# Patient Record
Sex: Female | Born: 1940
Health system: Southern US, Community
[De-identification: ages and names within clinical notes are randomized; demographics above are authoritative.]

## PROBLEM LIST (undated history)

## (undated) DIAGNOSIS — I10 Essential (primary) hypertension: Secondary | ICD-10-CM

## (undated) DIAGNOSIS — M1990 Unspecified osteoarthritis, unspecified site: Secondary | ICD-10-CM

## (undated) DIAGNOSIS — H3530 Unspecified macular degeneration: Secondary | ICD-10-CM

## (undated) DIAGNOSIS — E785 Hyperlipidemia, unspecified: Secondary | ICD-10-CM

## (undated) DIAGNOSIS — E119 Type 2 diabetes mellitus without complications: Secondary | ICD-10-CM

## (undated) DIAGNOSIS — C50919 Malignant neoplasm of unspecified site of unspecified female breast: Secondary | ICD-10-CM

## (undated) DIAGNOSIS — N189 Chronic kidney disease, unspecified: Secondary | ICD-10-CM

## (undated) HISTORY — DX: Unspecified macular degeneration: H35.30

## (undated) HISTORY — DX: Unspecified osteoarthritis, unspecified site: M19.90

## (undated) HISTORY — DX: Essential (primary) hypertension: I10

## (undated) HISTORY — DX: Chronic kidney disease, unspecified: N18.9

## (undated) HISTORY — PX: COLONOSCOPY: SHX174

## (undated) HISTORY — DX: Malignant neoplasm of unspecified site of unspecified female breast: C50.919

## (undated) HISTORY — DX: Hyperlipidemia, unspecified: E78.5

---

## 2000-01-24 ENCOUNTER — Other Ambulatory Visit: Admission: RE | Admit: 2000-01-24 | Discharge: 2000-01-24 | Payer: Self-pay | Admitting: Family Medicine

## 2000-02-01 ENCOUNTER — Encounter: Payer: Self-pay | Admitting: Family Medicine

## 2000-02-01 ENCOUNTER — Encounter: Admission: RE | Admit: 2000-02-01 | Discharge: 2000-02-01 | Payer: Self-pay | Admitting: Family Medicine

## 2001-11-18 ENCOUNTER — Encounter: Admission: RE | Admit: 2001-11-18 | Discharge: 2001-11-18 | Payer: Self-pay | Admitting: Family Medicine

## 2001-11-18 ENCOUNTER — Encounter: Payer: Self-pay | Admitting: Family Medicine

## 2003-04-16 ENCOUNTER — Ambulatory Visit (HOSPITAL_COMMUNITY): Admission: RE | Admit: 2003-04-16 | Discharge: 2003-04-16 | Payer: Self-pay | Admitting: Family Medicine

## 2003-04-16 ENCOUNTER — Encounter: Payer: Self-pay | Admitting: Family Medicine

## 2003-07-16 ENCOUNTER — Encounter: Admission: RE | Admit: 2003-07-16 | Discharge: 2003-07-16 | Payer: Self-pay | Admitting: Family Medicine

## 2003-07-16 ENCOUNTER — Encounter: Payer: Self-pay | Admitting: Family Medicine

## 2004-03-07 ENCOUNTER — Other Ambulatory Visit: Admission: RE | Admit: 2004-03-07 | Discharge: 2004-03-07 | Payer: Self-pay | Admitting: Family Medicine

## 2004-09-27 ENCOUNTER — Ambulatory Visit: Payer: Self-pay | Admitting: Family Medicine

## 2004-12-08 ENCOUNTER — Encounter (HOSPITAL_COMMUNITY): Admission: RE | Admit: 2004-12-08 | Discharge: 2005-01-07 | Payer: Self-pay | Admitting: Internal Medicine

## 2005-01-02 ENCOUNTER — Ambulatory Visit: Payer: Self-pay | Admitting: Family Medicine

## 2005-02-15 ENCOUNTER — Ambulatory Visit: Payer: Self-pay | Admitting: Family Medicine

## 2005-03-22 ENCOUNTER — Ambulatory Visit: Payer: Self-pay | Admitting: Family Medicine

## 2005-03-29 ENCOUNTER — Other Ambulatory Visit: Admission: RE | Admit: 2005-03-29 | Discharge: 2005-03-29 | Payer: Self-pay | Admitting: Family Medicine

## 2005-03-29 ENCOUNTER — Ambulatory Visit: Payer: Self-pay | Admitting: Family Medicine

## 2005-03-29 LAB — CONVERTED CEMR LAB

## 2005-05-04 ENCOUNTER — Encounter: Admission: RE | Admit: 2005-05-04 | Discharge: 2005-05-04 | Payer: Self-pay | Admitting: Family Medicine

## 2005-07-10 ENCOUNTER — Ambulatory Visit: Payer: Self-pay | Admitting: Family Medicine

## 2005-10-11 ENCOUNTER — Ambulatory Visit: Payer: Self-pay | Admitting: Family Medicine

## 2006-02-19 ENCOUNTER — Ambulatory Visit: Payer: Self-pay | Admitting: Family Medicine

## 2006-04-02 ENCOUNTER — Encounter: Payer: Self-pay | Admitting: Family Medicine

## 2006-04-02 ENCOUNTER — Other Ambulatory Visit: Admission: RE | Admit: 2006-04-02 | Discharge: 2006-04-02 | Payer: Self-pay | Admitting: Family Medicine

## 2006-04-02 ENCOUNTER — Ambulatory Visit: Payer: Self-pay | Admitting: Family Medicine

## 2006-04-04 LAB — CONVERTED CEMR LAB: Pap Smear: NORMAL

## 2006-05-08 ENCOUNTER — Ambulatory Visit: Payer: Self-pay | Admitting: Family Medicine

## 2006-06-05 ENCOUNTER — Ambulatory Visit: Payer: Self-pay | Admitting: Family Medicine

## 2006-07-18 ENCOUNTER — Encounter: Admission: RE | Admit: 2006-07-18 | Discharge: 2006-07-18 | Payer: Self-pay | Admitting: Family Medicine

## 2006-07-18 IMAGING — MG MM SCREEN MAMMOGRAM BILATERAL
4 series · 4 of 4 positions shown · non-contrast
Comparison: none

DG SCREEN MAMMOGRAM BILATERAL
Bilateral CC and MLO view(s) were taken.

DIGITAL SCREENING MAMMOGRAM WITH CAD:
There is a fibrofatty pattern.  No masses or malignant type calcifications are identified.  
Compared with prior studies.

[R CC]
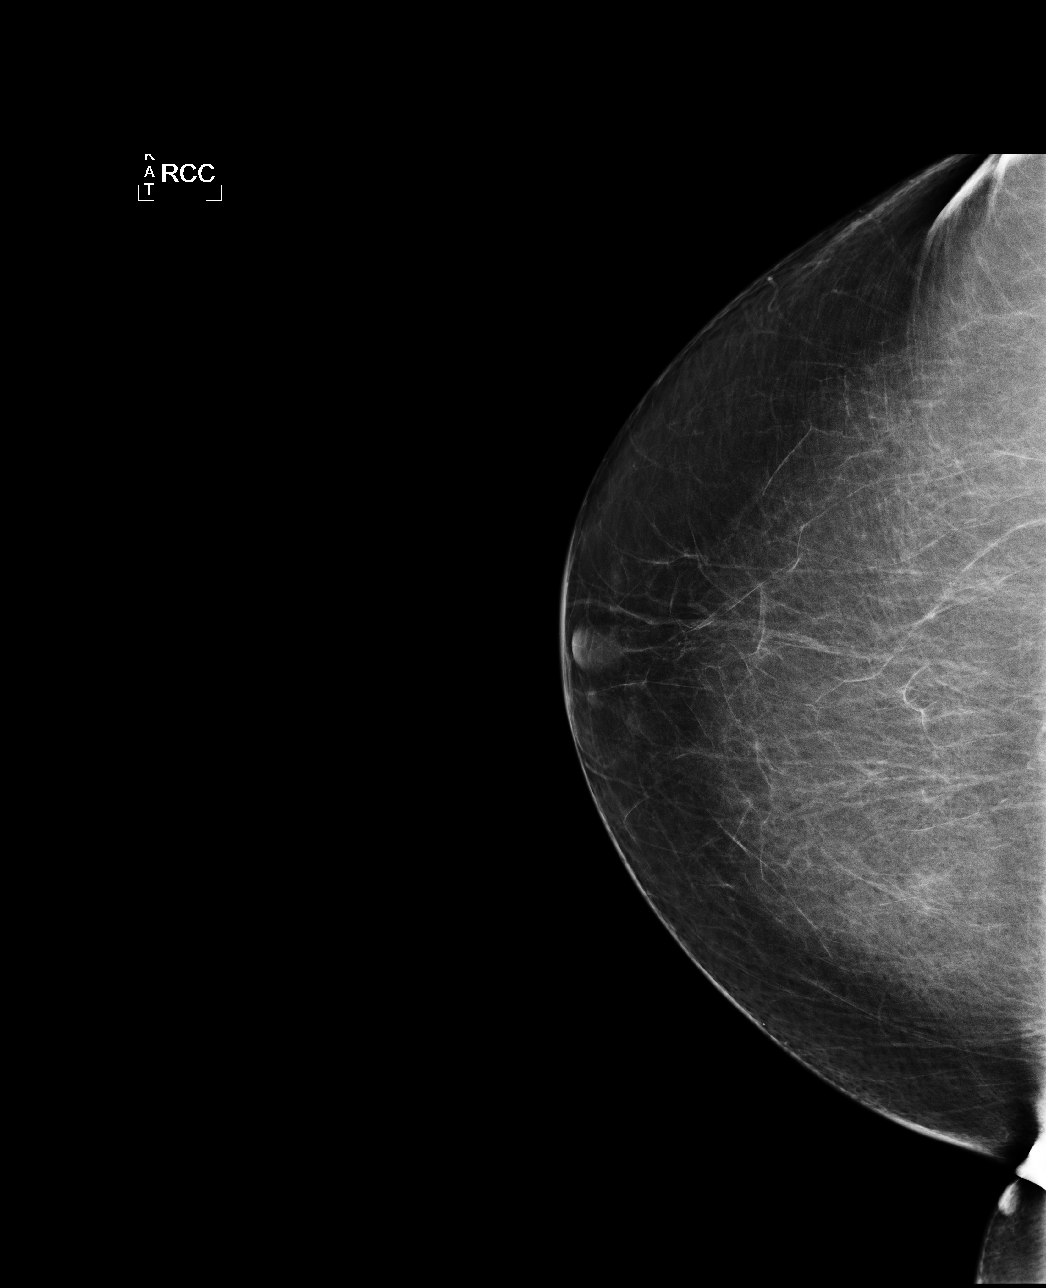

[L CC]
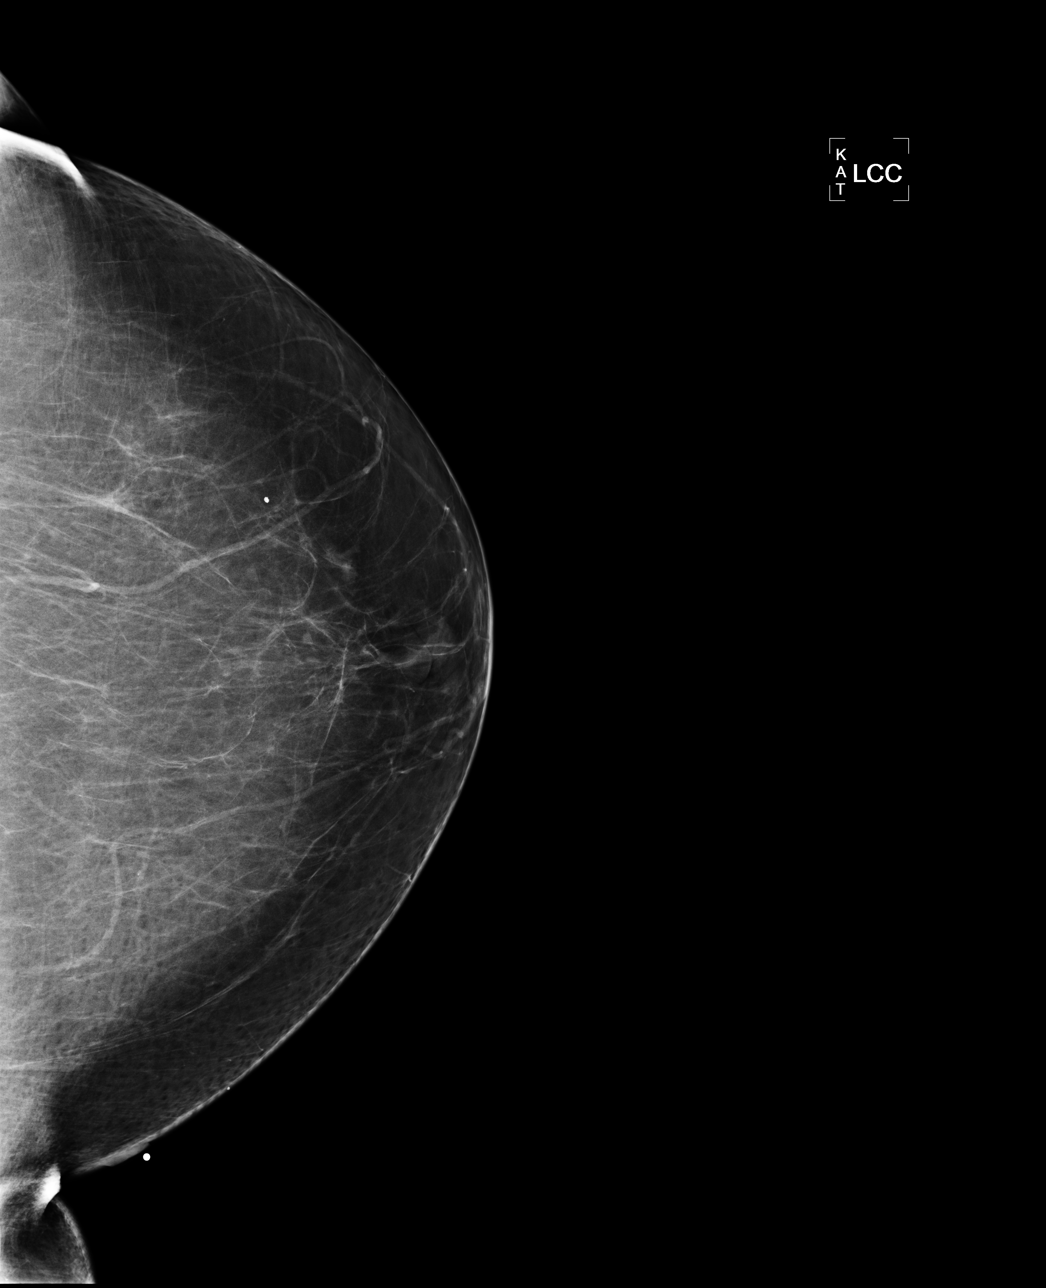

[L MLO]
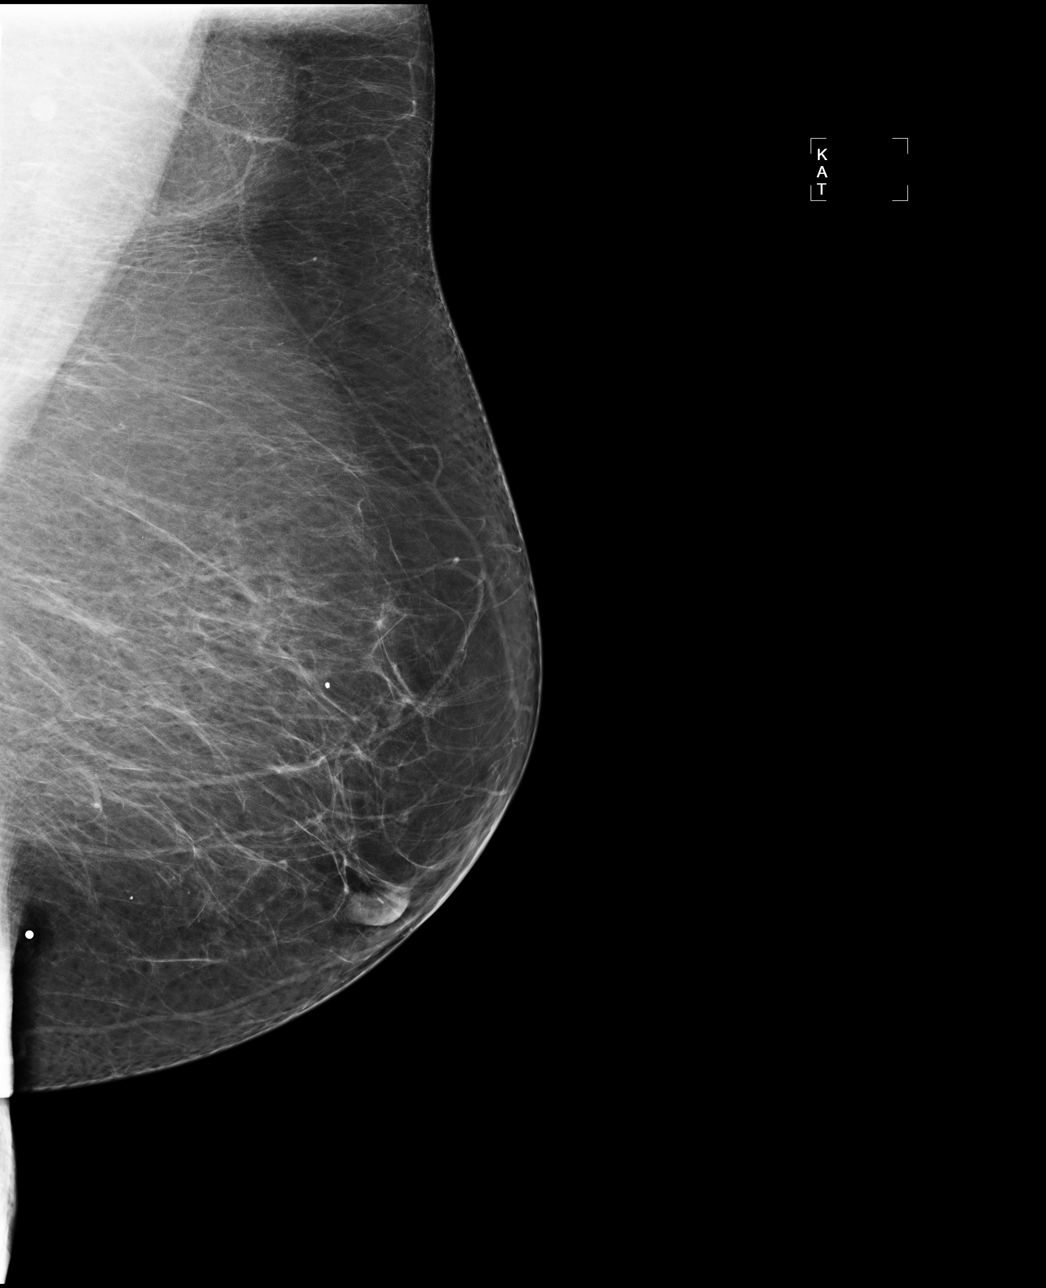

[R MLO]
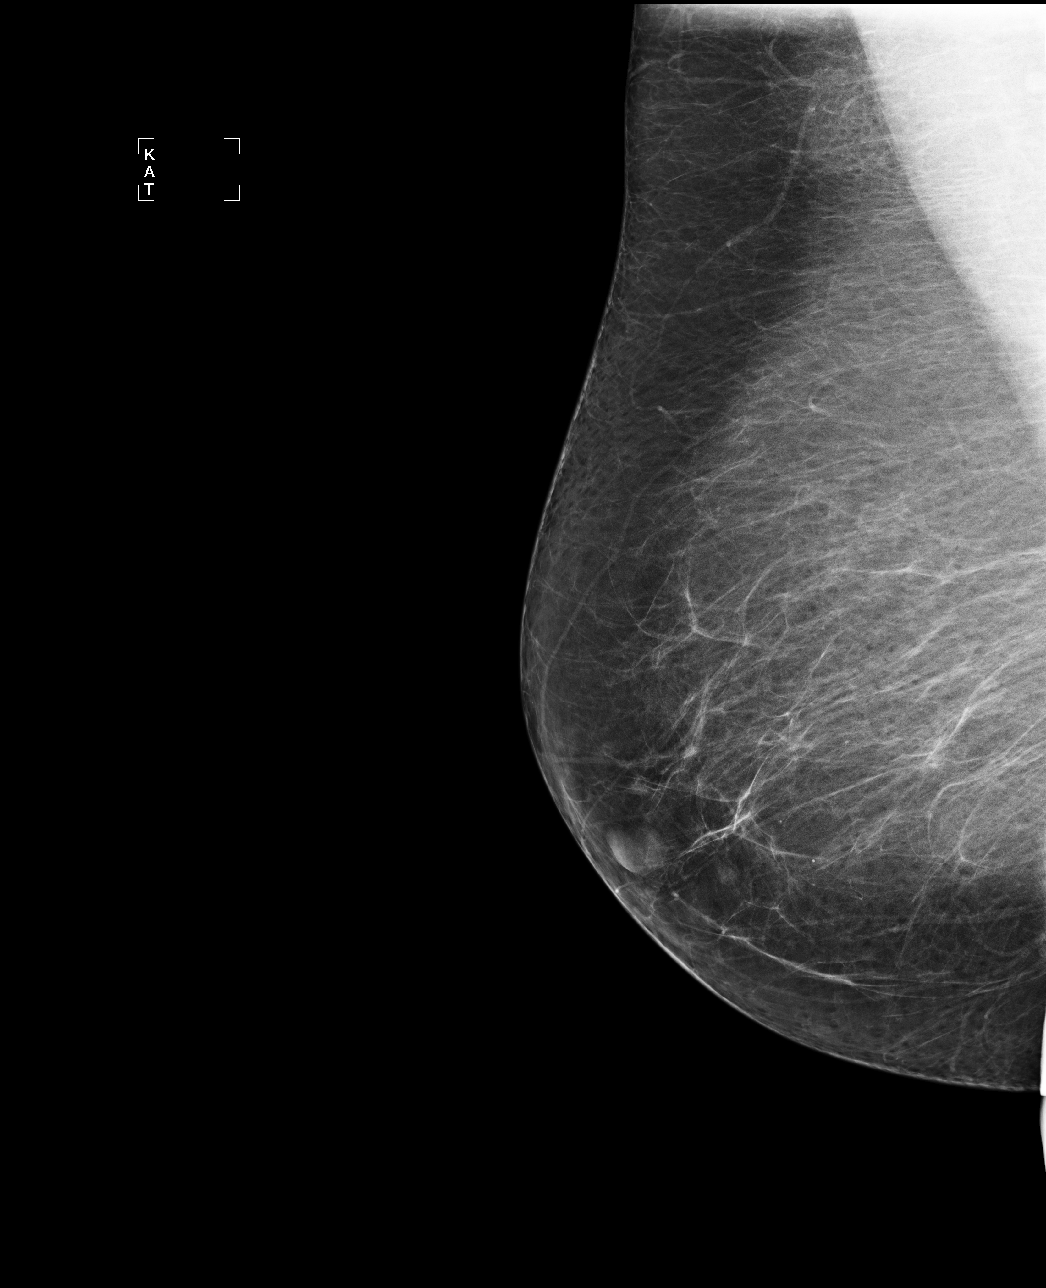

[4 of 4 positions shown; findings below may reference images not displayed]

IMPRESSION: No specific mammographic evidence of malignancy.  Next screening mammogram is recommended in one 
year.

ASSESSMENT: Negative - BI-RADS 1

Screening mammogram in 1 year.
ANALYZED BY COMPUTER AIDED DETECTION. , THIS PROCEDURE WAS A DIGITAL MAMMOGRAM.

## 2006-12-26 ENCOUNTER — Ambulatory Visit: Payer: Self-pay | Admitting: Family Medicine

## 2007-06-02 ENCOUNTER — Encounter: Payer: Self-pay | Admitting: Family Medicine

## 2007-06-02 DIAGNOSIS — I10 Essential (primary) hypertension: Secondary | ICD-10-CM | POA: Insufficient documentation

## 2007-06-19 ENCOUNTER — Ambulatory Visit: Payer: Self-pay | Admitting: Family Medicine

## 2007-06-19 DIAGNOSIS — Z8739 Personal history of other diseases of the musculoskeletal system and connective tissue: Secondary | ICD-10-CM | POA: Insufficient documentation

## 2007-06-19 DIAGNOSIS — M810 Age-related osteoporosis without current pathological fracture: Secondary | ICD-10-CM | POA: Insufficient documentation

## 2007-06-19 DIAGNOSIS — D649 Anemia, unspecified: Secondary | ICD-10-CM | POA: Insufficient documentation

## 2007-06-19 DIAGNOSIS — E7800 Pure hypercholesterolemia, unspecified: Secondary | ICD-10-CM | POA: Insufficient documentation

## 2007-06-19 DIAGNOSIS — B369 Superficial mycosis, unspecified: Secondary | ICD-10-CM | POA: Insufficient documentation

## 2007-06-19 DIAGNOSIS — E039 Hypothyroidism, unspecified: Secondary | ICD-10-CM | POA: Insufficient documentation

## 2007-06-19 DIAGNOSIS — N189 Chronic kidney disease, unspecified: Secondary | ICD-10-CM | POA: Insufficient documentation

## 2007-06-20 ENCOUNTER — Encounter: Payer: Self-pay | Admitting: Family Medicine

## 2007-07-02 LAB — CONVERTED CEMR LAB: Vit D, 1,25-Dihydroxy: 22 (ref 20–57)

## 2007-08-27 ENCOUNTER — Encounter: Admission: RE | Admit: 2007-08-27 | Discharge: 2007-08-27 | Payer: Self-pay | Admitting: Family Medicine

## 2007-10-01 ENCOUNTER — Telehealth: Payer: Self-pay | Admitting: Family Medicine

## 2007-10-02 ENCOUNTER — Telehealth: Payer: Self-pay | Admitting: Family Medicine

## 2007-10-10 ENCOUNTER — Encounter: Payer: Self-pay | Admitting: Family Medicine

## 2007-10-10 ENCOUNTER — Ambulatory Visit (HOSPITAL_COMMUNITY): Admission: RE | Admit: 2007-10-10 | Discharge: 2007-10-10 | Payer: Self-pay | Admitting: Family Medicine

## 2007-10-14 ENCOUNTER — Ambulatory Visit: Payer: Self-pay | Admitting: Family Medicine

## 2007-10-14 LAB — CONVERTED CEMR LAB: Hemoglobin: 12.9 g/dL

## 2007-10-15 ENCOUNTER — Encounter: Payer: Self-pay | Admitting: Family Medicine

## 2007-10-15 LAB — CONVERTED CEMR LAB: Vit D, 1,25-Dihydroxy: 32 (ref 30–89)

## 2007-11-10 ENCOUNTER — Encounter: Payer: Self-pay | Admitting: Family Medicine

## 2007-11-13 ENCOUNTER — Telehealth: Payer: Self-pay | Admitting: Family Medicine

## 2007-11-17 LAB — CONVERTED CEMR LAB
Cholesterol: 232 mg/dL (ref 0–200)
Direct LDL: 149.5 mg/dL
HDL: 39.7 mg/dL (ref >39.0)
Total CHOL/HDL Ratio: 5.8
Triglycerides: 213 mg/dL (ref 0–149)
VLDL: 43 mg/dL — ABNORMAL HIGH (ref 0–40)

## 2007-12-03 ENCOUNTER — Telehealth: Payer: Self-pay | Admitting: Family Medicine

## 2008-01-20 ENCOUNTER — Ambulatory Visit: Payer: Self-pay | Admitting: Family Medicine

## 2008-01-20 DIAGNOSIS — R609 Edema, unspecified: Secondary | ICD-10-CM | POA: Insufficient documentation

## 2008-01-20 DIAGNOSIS — G458 Other transient cerebral ischemic attacks and related syndromes: Secondary | ICD-10-CM | POA: Insufficient documentation

## 2008-01-23 ENCOUNTER — Ambulatory Visit (HOSPITAL_COMMUNITY): Admission: RE | Admit: 2008-01-23 | Discharge: 2008-01-23 | Payer: Self-pay | Admitting: Family Medicine

## 2008-01-23 IMAGING — US US CAROTID DUPLEX BILAT
1 series · 13 of 24 positions shown · non-contrast
Comparison: None

CLINICAL DATA: T I A AND HYPERTENSION

BILATERAL CAROTID DUPLEX ULTRASOUND
TECHNIQUE: Gray scale imaging, color Doppler and duplex ultrasound
was performed of bilateral carotid and vertebral arteries in the
neck.

[Series 1: unknown · 0.09mm/px · 13 of 52 slices shown]
[im 1/52]
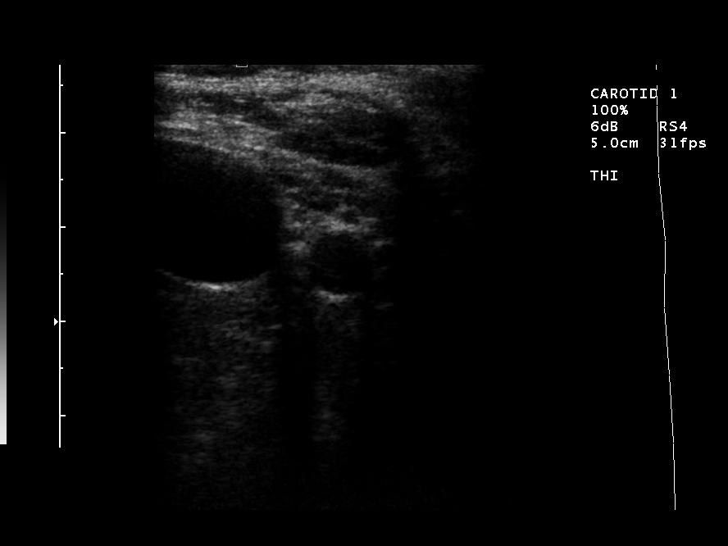
[im 5/52]
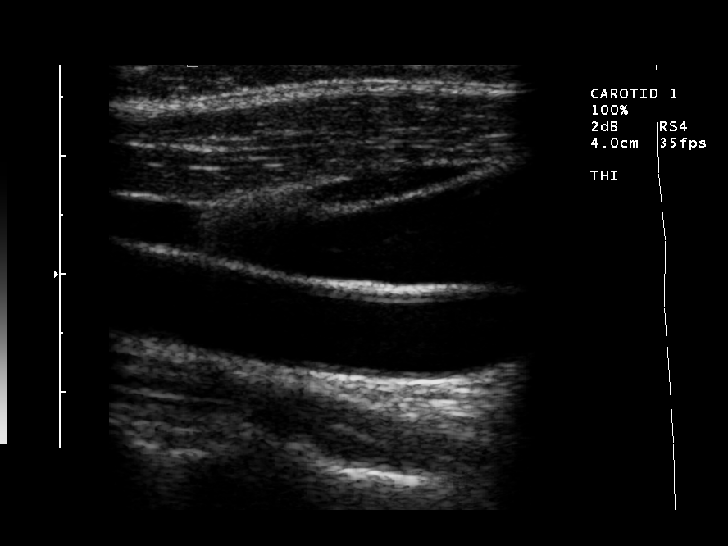
[im 9/52]
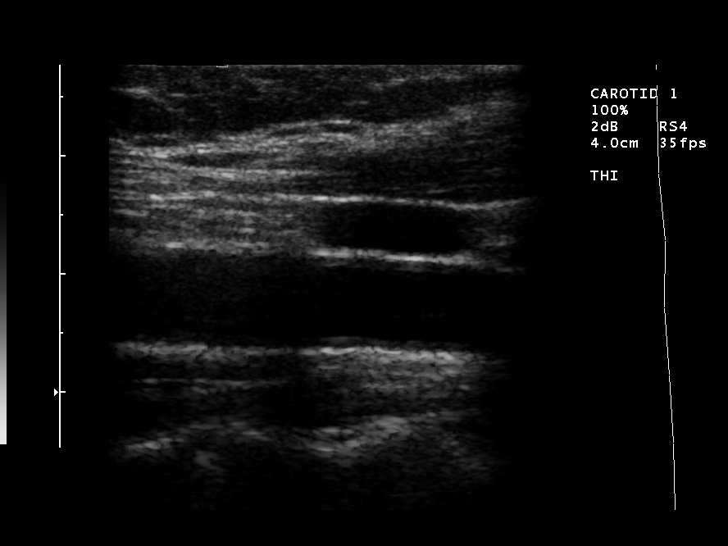
[im 14/52]
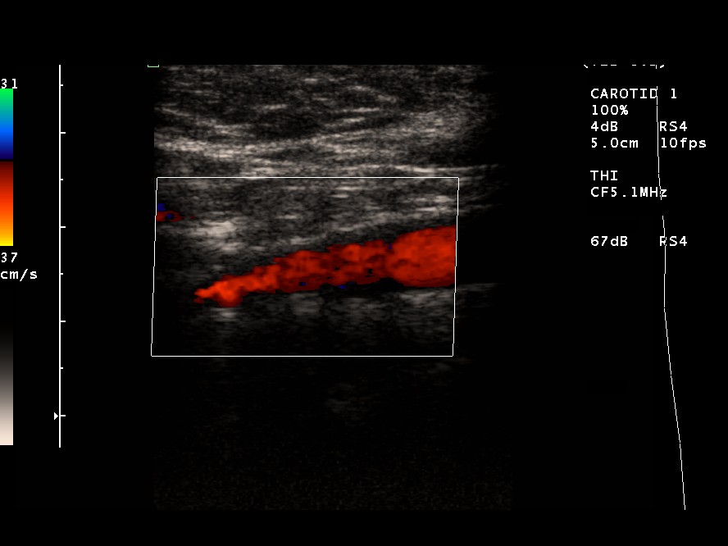
[im 18/52]
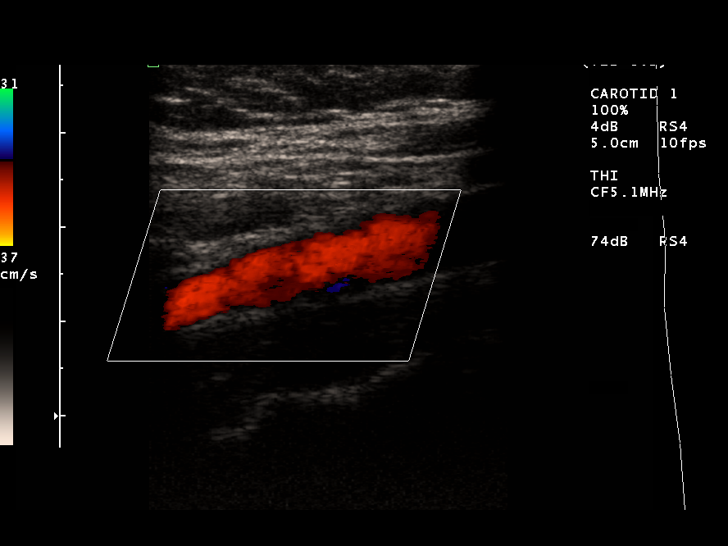
[im 23/52]
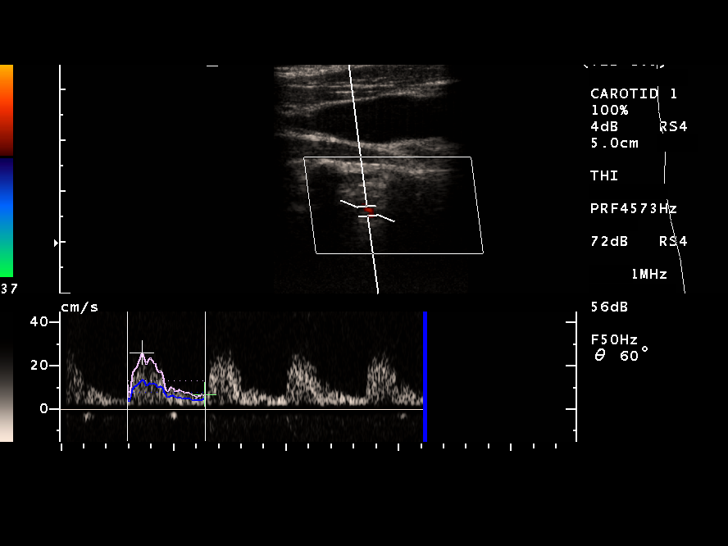
[im 27/52]
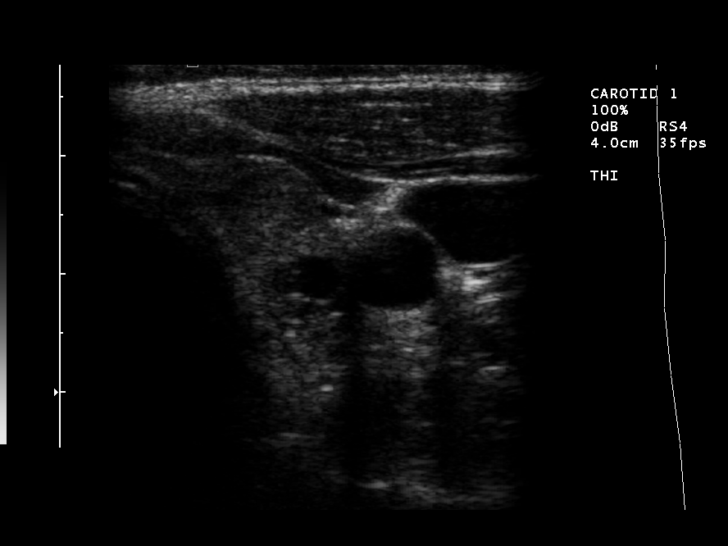
[im 29/52]
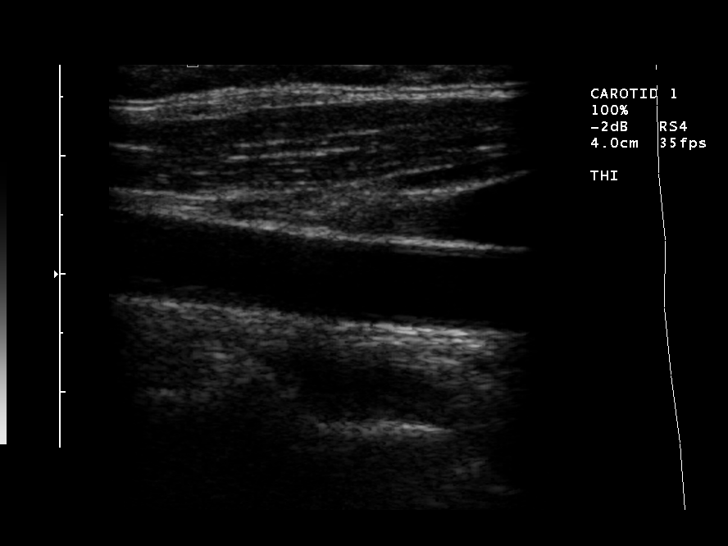
[im 34/52]
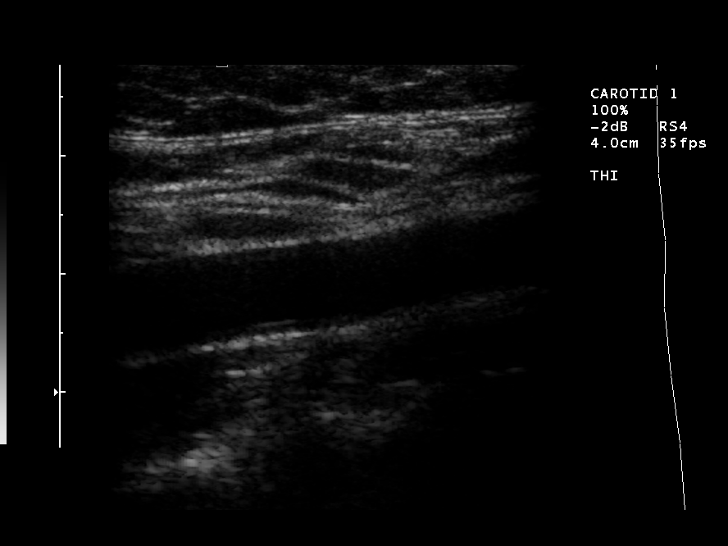
[im 38/52]
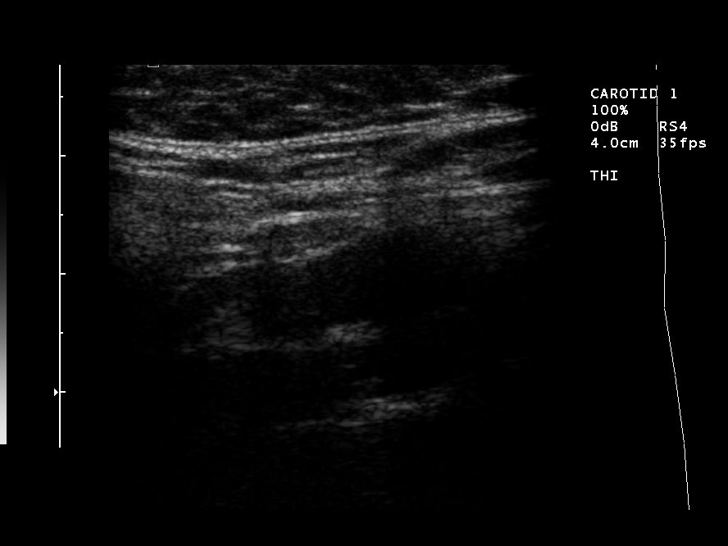
[im 43/52]
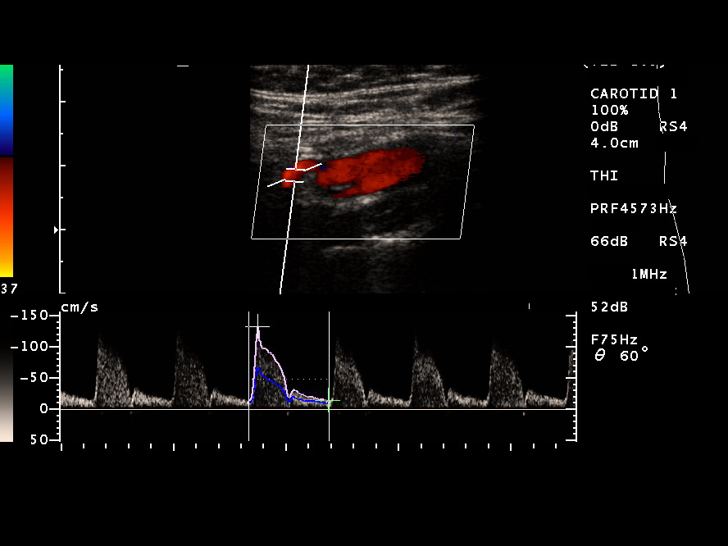
[im 47/52]
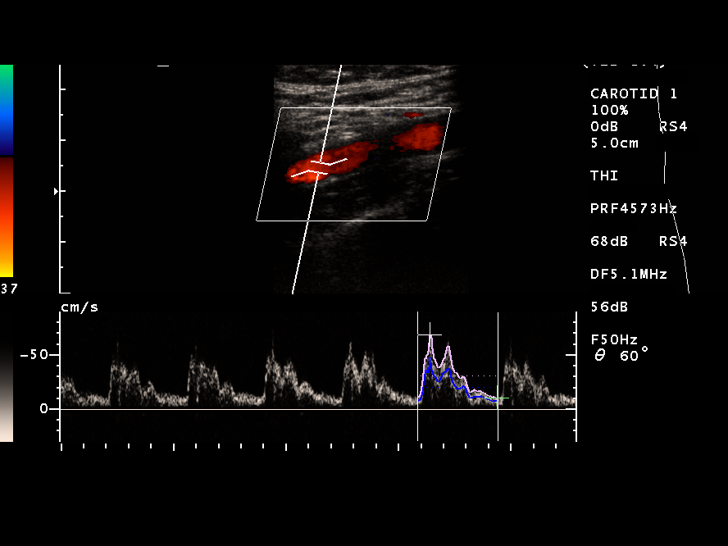
[im 52/52]
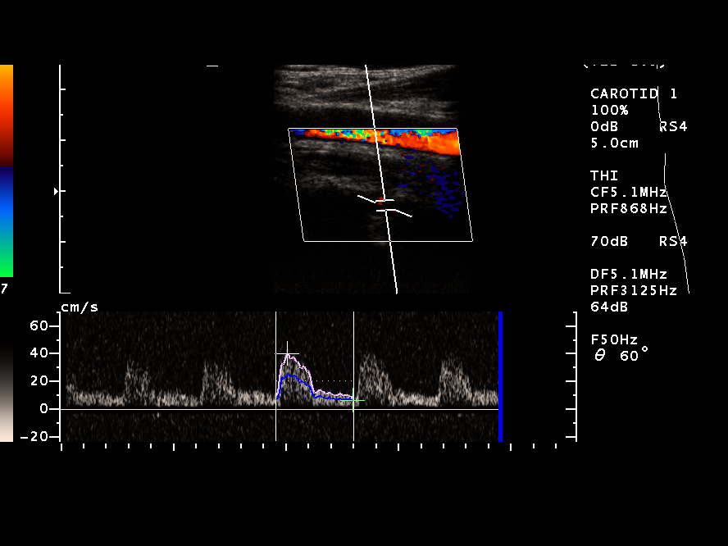

[13 of 24 positions shown; findings below may reference images not displayed]

Criteria:  Quantification of carotid stenosis is based on velocity
parameters that correlate the residual internal carotid diameter
with NASCET-based stenosis levels.

The following velocity measurements were obtained:

                 PEAK SYSTOLIC/END DIASTOLIC
RIGHT
ICA:                        96/16cm/sec
CCA:                        96/20cm/sec
SYSTOLIC ICA/CCA RATIO:
DIASTOLIC ICA/CCA RATIO:
ECA:                        114/20cm/sec

LEFT
ICA:                        76/20cm/sec
CCA:                        92/17cm/sec
SYSTOLIC ICA/CCA RATIO:
DIASTOLIC ICA/CCA RATIO:
ECA:                        131/13cm/sec
FINDINGS: RIGHT CAROTID ARTERY: No significant plaque.  Normal velocities and
spectral wave forms present.  Estimated right ICA stenosis in its
less than 50%.

RIGHT VERTEBRAL ARTERY:  Antegrade flow with normal wave form.

LEFT CAROTID ARTERY: Small focus of plaque in the distal bulb and
the proximal external carotid artery.  No significant plaque is
identified in the left ICA.  Normal velocities and spectral wave
forms correspond to an estimated left ICA stenosis of less than
50%.

LEFT VERTEBRAL ARTERY:  Antegrade flow with normal wave form.
IMPRESSION: No significant carotid disease identified in the neck.  Estimated
bilateral less than 50% ICA stenoses based on velocity criteria.

## 2008-02-19 ENCOUNTER — Ambulatory Visit: Payer: Self-pay | Admitting: Family Medicine

## 2008-03-17 ENCOUNTER — Telehealth: Payer: Self-pay | Admitting: Family Medicine

## 2008-05-20 ENCOUNTER — Ambulatory Visit: Payer: Self-pay | Admitting: Family Medicine

## 2008-05-20 DIAGNOSIS — L259 Unspecified contact dermatitis, unspecified cause: Secondary | ICD-10-CM | POA: Insufficient documentation

## 2008-07-20 ENCOUNTER — Other Ambulatory Visit: Admission: RE | Admit: 2008-07-20 | Discharge: 2008-07-20 | Payer: Self-pay | Admitting: Family Medicine

## 2008-07-20 ENCOUNTER — Ambulatory Visit: Payer: Self-pay | Admitting: Family Medicine

## 2008-07-20 ENCOUNTER — Encounter: Payer: Self-pay | Admitting: Family Medicine

## 2008-07-20 DIAGNOSIS — F411 Generalized anxiety disorder: Secondary | ICD-10-CM | POA: Insufficient documentation

## 2008-07-20 DIAGNOSIS — T50995A Adverse effect of other drugs, medicaments and biological substances, initial encounter: Secondary | ICD-10-CM | POA: Insufficient documentation

## 2008-07-20 LAB — CONVERTED CEMR LAB
Bilirubin Urine: NEGATIVE
Glucose, Urine, Semiquant: NEGATIVE
Ketones, urine, test strip: NEGATIVE
Nitrite: NEGATIVE
Protein, U semiquant: NEGATIVE
Specific Gravity, Urine: 1.015
Urobilinogen, UA: 0.2
pH: 5.5

## 2008-07-20 LAB — HM PAP SMEAR

## 2008-07-25 LAB — CONVERTED CEMR LAB
ALT: 20 units/L (ref 0–35)
AST: 21 units/L (ref 0–37)
Albumin: 4 g/dL (ref 3.5–5.2)
Alkaline Phosphatase: 52 units/L (ref 39–117)
BUN: 28 mg/dL — ABNORMAL HIGH (ref 6–23)
Basophils Absolute: 0.1 10*3/uL (ref 0.0–0.1)
Basophils Relative: 2 % (ref 0.0–3.0)
Bilirubin, Direct: 0.1 mg/dL (ref 0.0–0.3)
CO2: 26 meq/L (ref 19–32)
Calcium: 10 mg/dL (ref 8.4–10.5)
Chloride: 107 meq/L (ref 96–112)
Cholesterol: 256 mg/dL (ref 0–200)
Creatinine, Ser: 1.2 mg/dL (ref 0.4–1.2)
Direct LDL: 188.5 mg/dL
Eosinophils Absolute: 0.2 10*3/uL (ref 0.0–0.7)
Eosinophils Relative: 3 % (ref 0.0–5.0)
GFR calc Af Amer: 58 mL/min
GFR calc non Af Amer: 48 mL/min
Glucose, Bld: 115 mg/dL — ABNORMAL HIGH (ref 70–99)
HCT: 34.8 % — ABNORMAL LOW (ref 36.0–46.0)
HDL: 41.7 mg/dL (ref >39.0)
Hemoglobin: 12.5 g/dL (ref 12.0–15.0)
Lymphocytes Relative: 17.1 % (ref 12.0–46.0)
MCHC: 35.8 g/dL (ref 30.0–36.0)
MCV: 90.3 fL (ref 78.0–100.0)
Monocytes Absolute: 0.5 10*3/uL (ref 0.1–1.0)
Monocytes Relative: 8 % (ref 3.0–12.0)
Neutro Abs: 4.2 10*3/uL (ref 1.4–7.7)
Neutrophils Relative %: 69.9 % (ref 43.0–77.0)
Platelets: 353 10*3/uL (ref 150–400)
Potassium: 4.1 meq/L (ref 3.5–5.1)
RBC: 3.86 M/uL — ABNORMAL LOW (ref 3.87–5.11)
RDW: 12.3 % (ref 11.5–14.6)
Sodium: 140 meq/L (ref 135–145)
TSH: 1.23 microintl units/mL (ref 0.35–5.50)
Total Bilirubin: 1 mg/dL (ref 0.3–1.2)
Total CHOL/HDL Ratio: 6.1
Total Protein: 7.7 g/dL (ref 6.0–8.3)
Triglycerides: 182 mg/dL — ABNORMAL HIGH (ref 0–149)
VLDL: 36 mg/dL (ref 0–40)
Vit D, 1,25-Dihydroxy: 53 (ref 30–89)
WBC: 6 10*3/uL (ref 4.5–10.5)

## 2008-10-19 ENCOUNTER — Ambulatory Visit: Payer: Self-pay | Admitting: Family Medicine

## 2008-11-11 ENCOUNTER — Encounter: Admission: RE | Admit: 2008-11-11 | Discharge: 2008-11-11 | Payer: Self-pay | Admitting: Family Medicine

## 2008-11-11 ENCOUNTER — Telehealth: Payer: Self-pay | Admitting: Family Medicine

## 2008-11-11 IMAGING — MG MM SCREEN MAMMOGRAM BILATERAL
5 series · 5 of 5 positions shown · non-contrast
Comparison: none

DG SCREEN MAMMOGRAM BILATERAL
Bilateral CC and MLO view(s) were taken.

DIGITAL SCREENING MAMMOGRAM WITH CAD:
There are scattered fibroglandular densities.  No masses or malignant type calcifications are 
identified.  Compared with prior studies.

[R CC]
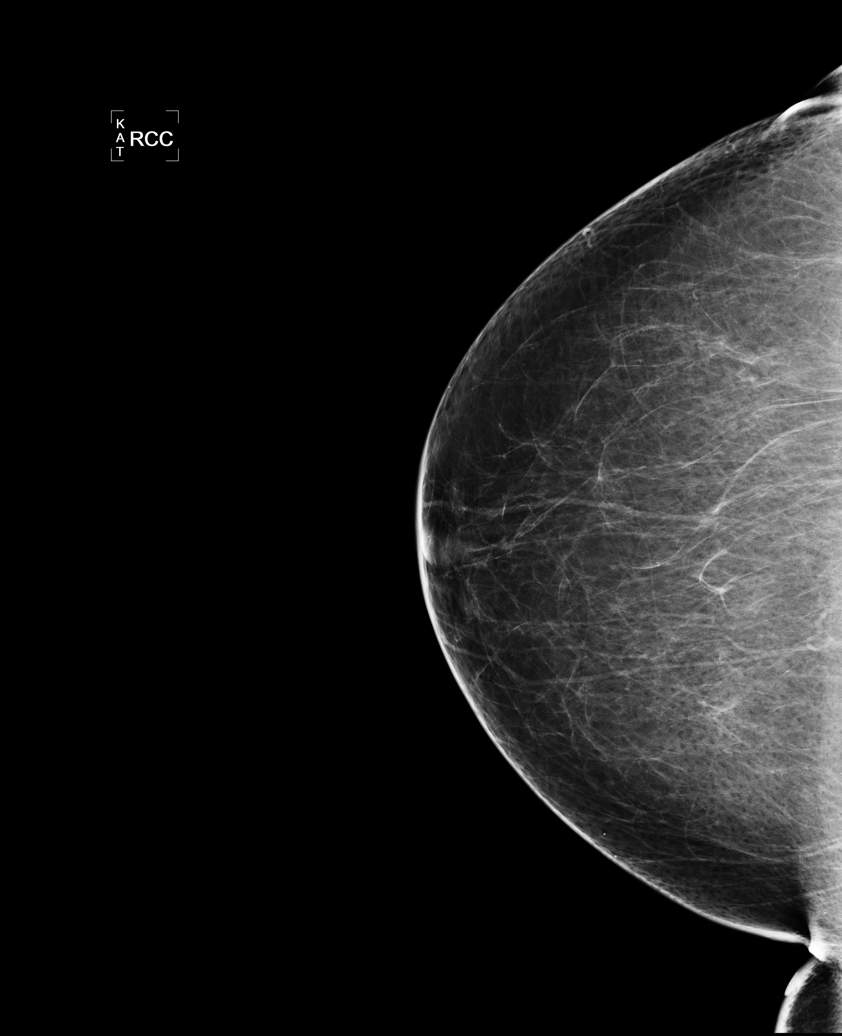

[L CC]
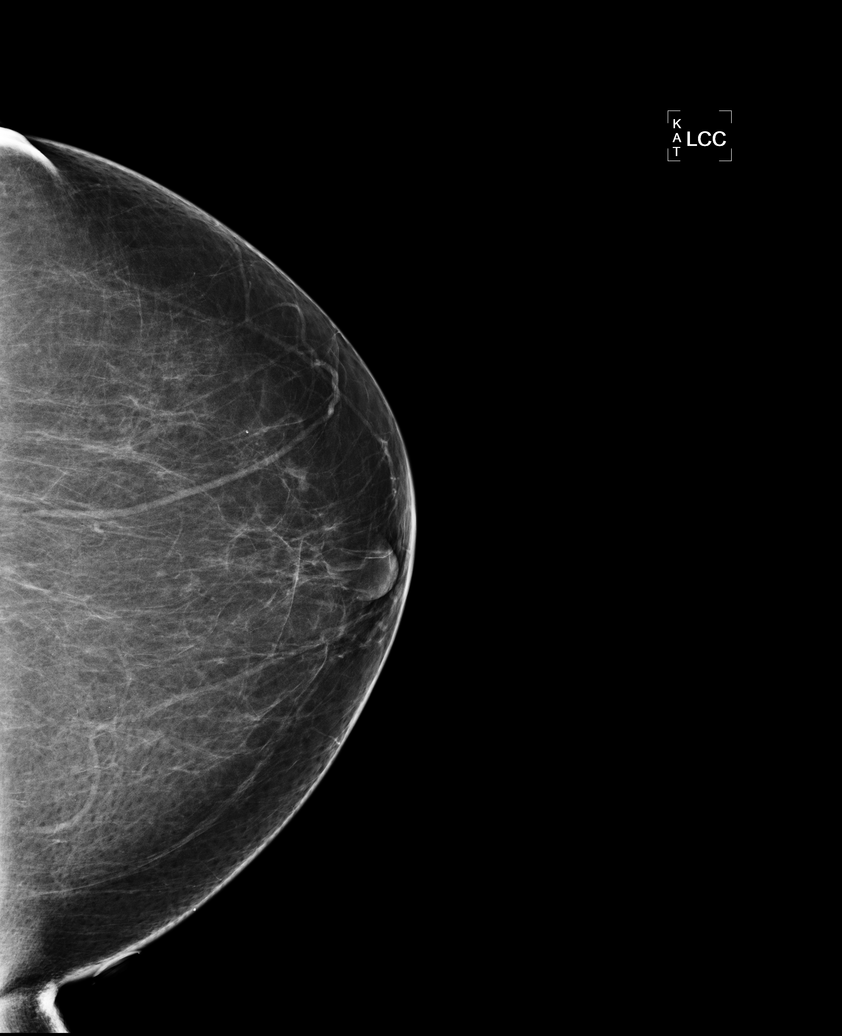

[L MLO (1 of 2)]
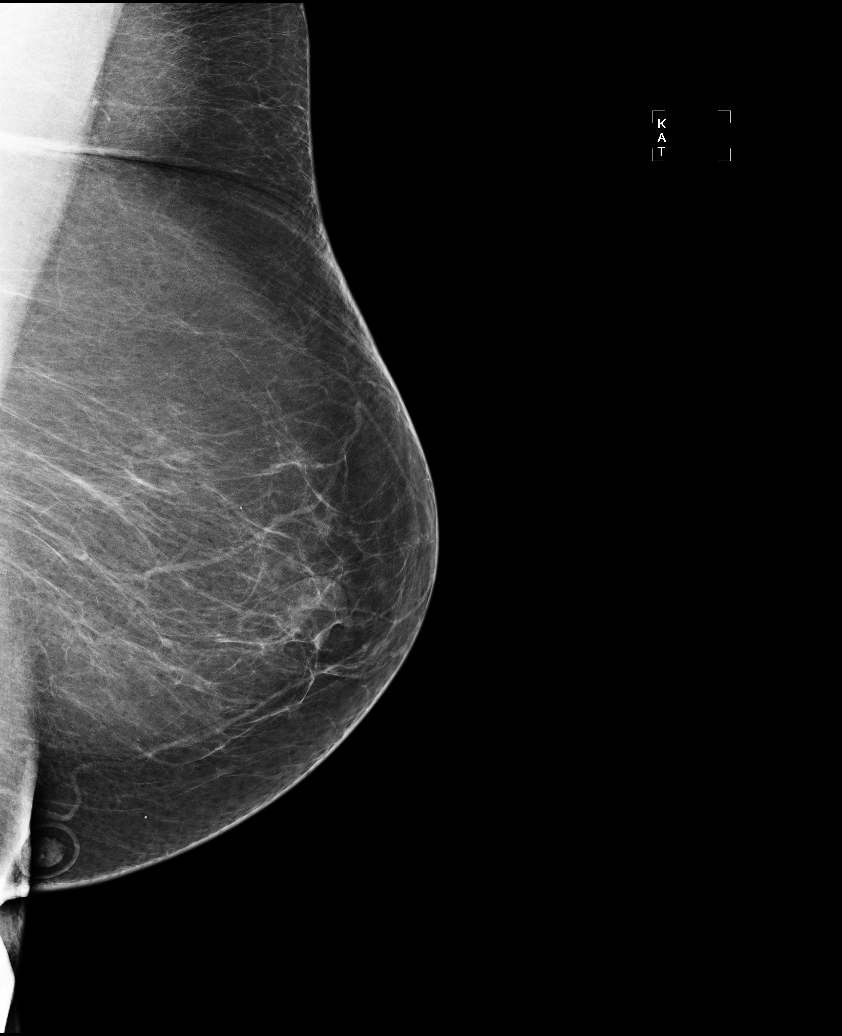

[R MLO]
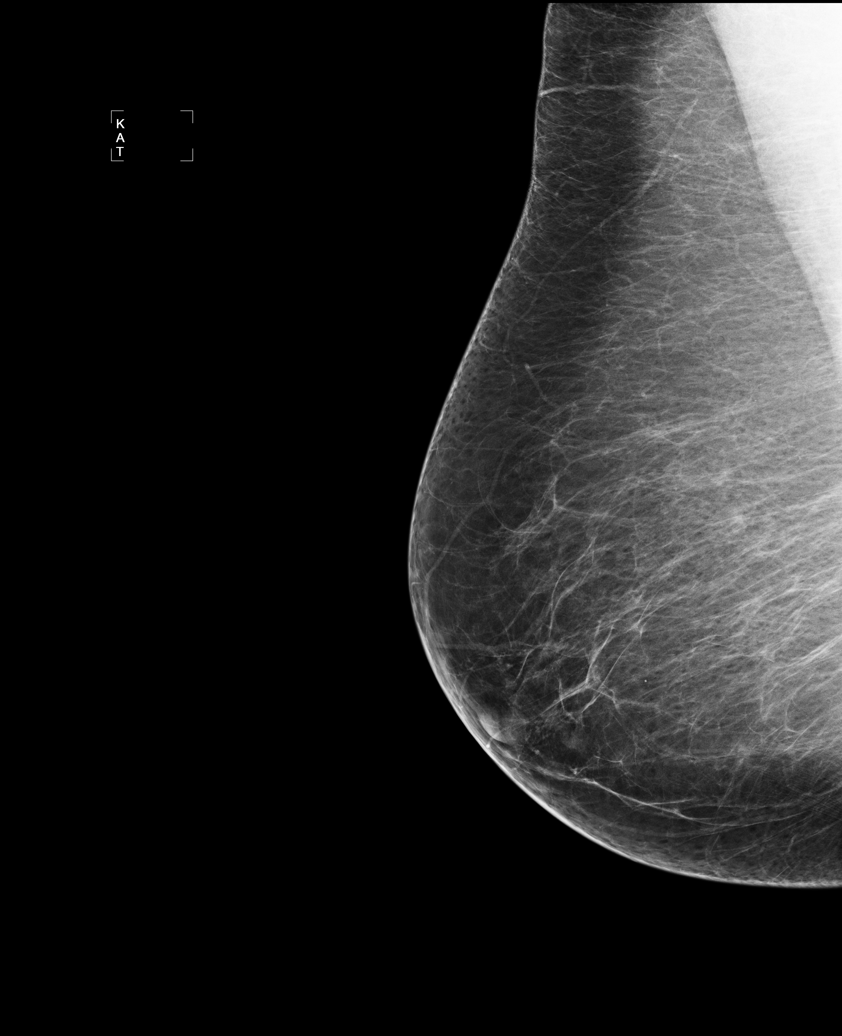

[L MLO (2 of 2)]
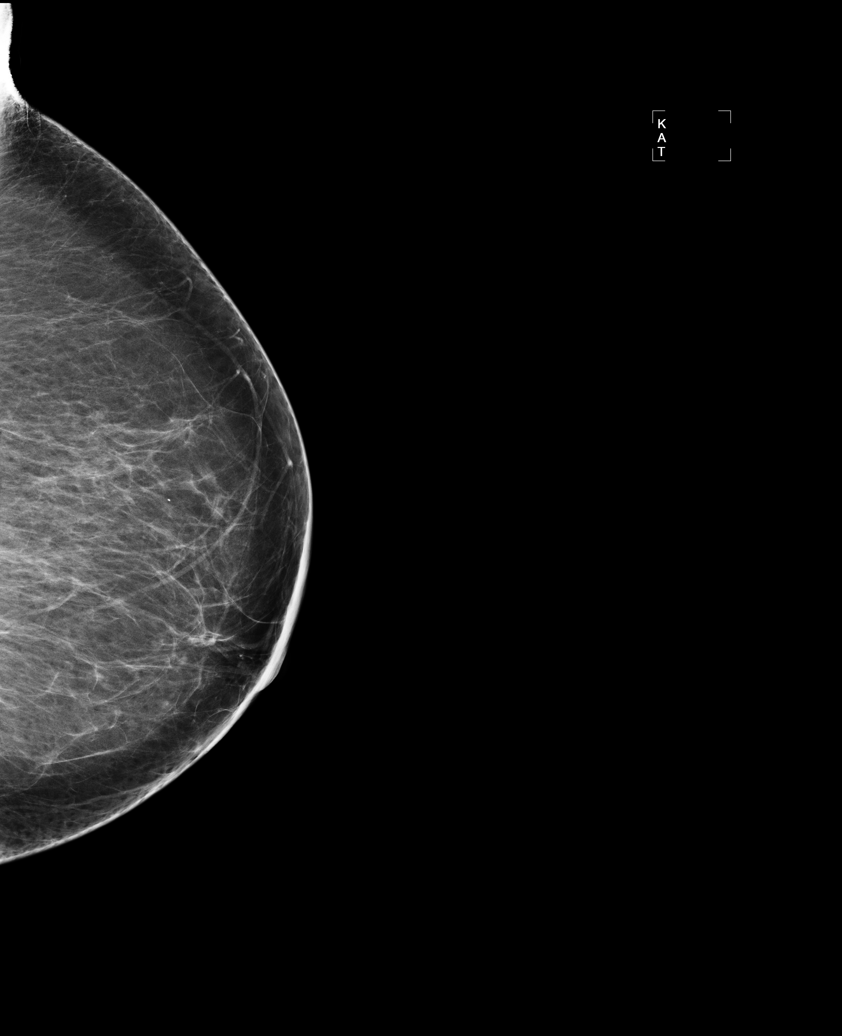

[5 of 5 positions shown; findings below may reference images not displayed]

IMPRESSION: No specific mammographic evidence of malignancy.  Next screening mammogram is recommended in one 
year.

ASSESSMENT: Negative - BI-RADS 1

Screening mammogram in 1 year.
ANALYZED BY COMPUTER AIDED DETECTION. , THIS PROCEDURE WAS A DIGITAL MAMMOGRAM.

## 2008-11-16 LAB — CONVERTED CEMR LAB
ALT: 20 units/L (ref 0–35)
AST: 21 units/L (ref 0–37)
Albumin: 3.7 g/dL (ref 3.5–5.2)
Alkaline Phosphatase: 52 units/L (ref 39–117)
Bilirubin, Direct: 0.1 mg/dL (ref 0.0–0.3)
Cholesterol: 246 mg/dL (ref 0–200)
Direct LDL: 168.8 mg/dL
HDL: 39.8 mg/dL (ref >39.0)
Total Bilirubin: 0.7 mg/dL (ref 0.3–1.2)
Total CHOL/HDL Ratio: 6.2
Total Protein: 7 g/dL (ref 6.0–8.3)
Triglycerides: 139 mg/dL (ref 0–149)
VLDL: 28 mg/dL (ref 0–40)

## 2009-01-11 ENCOUNTER — Telehealth: Payer: Self-pay | Admitting: Family Medicine

## 2009-03-01 LAB — HM COLONOSCOPY

## 2009-03-14 ENCOUNTER — Ambulatory Visit: Payer: Self-pay | Admitting: Gastroenterology

## 2009-03-21 ENCOUNTER — Ambulatory Visit: Payer: Self-pay | Admitting: Gastroenterology

## 2009-04-28 ENCOUNTER — Telehealth: Payer: Self-pay | Admitting: Family Medicine

## 2009-07-13 ENCOUNTER — Telehealth: Payer: Self-pay | Admitting: Family Medicine

## 2009-08-10 ENCOUNTER — Ambulatory Visit: Payer: Self-pay | Admitting: Family Medicine

## 2009-08-10 DIAGNOSIS — Z6839 Body mass index (BMI) 39.0-39.9, adult: Secondary | ICD-10-CM | POA: Insufficient documentation

## 2009-08-10 DIAGNOSIS — E559 Vitamin D deficiency, unspecified: Secondary | ICD-10-CM | POA: Insufficient documentation

## 2009-08-10 DIAGNOSIS — E782 Mixed hyperlipidemia: Secondary | ICD-10-CM | POA: Insufficient documentation

## 2009-08-10 DIAGNOSIS — E669 Obesity, unspecified: Secondary | ICD-10-CM | POA: Insufficient documentation

## 2009-08-10 DIAGNOSIS — E785 Hyperlipidemia, unspecified: Secondary | ICD-10-CM | POA: Insufficient documentation

## 2009-08-10 LAB — CONVERTED CEMR LAB
Bilirubin Urine: NEGATIVE
Blood in Urine, dipstick: NEGATIVE
Glucose, Urine, Semiquant: NEGATIVE
Ketones, urine, test strip: NEGATIVE
Nitrite: NEGATIVE
Protein, U semiquant: NEGATIVE
Specific Gravity, Urine: 1.015
Urobilinogen, UA: 0.2
pH: 5.5

## 2009-08-15 LAB — CONVERTED CEMR LAB
ALT: 16 units/L (ref 0–35)
AST: 17 units/L (ref 0–37)
Albumin: 4.4 g/dL (ref 3.5–5.2)
Alkaline Phosphatase: 47 units/L (ref 39–117)
BUN: 29 mg/dL — ABNORMAL HIGH (ref 6–23)
Basophils Absolute: 0 10*3/uL (ref 0.0–0.1)
Basophils Relative: 0 % (ref 0.0–3.0)
Bilirubin, Direct: 0 mg/dL (ref 0.0–0.3)
CO2: 26 meq/L (ref 19–32)
Calcium: 9.9 mg/dL (ref 8.4–10.5)
Chloride: 103 meq/L (ref 96–112)
Cholesterol: 171 mg/dL (ref 0–200)
Creatinine, Ser: 1.4 mg/dL — ABNORMAL HIGH (ref 0.4–1.2)
Eosinophils Absolute: 0.4 10*3/uL (ref 0.0–0.7)
Eosinophils Relative: 5 % (ref 0.0–5.0)
GFR calc non Af Amer: 39.66 mL/min (ref >60)
Glucose, Bld: 110 mg/dL — ABNORMAL HIGH (ref 70–99)
HCT: 33.8 % — ABNORMAL LOW (ref 36.0–46.0)
HDL: 50.3 mg/dL (ref >39.00)
Hemoglobin: 11.7 g/dL — ABNORMAL LOW (ref 12.0–15.0)
LDL Cholesterol: 86 mg/dL (ref 0–99)
Lymphocytes Relative: 15 % (ref 12.0–46.0)
Lymphs Abs: 1.1 10*3/uL (ref 0.7–4.0)
MCHC: 34.7 g/dL (ref 30.0–36.0)
MCV: 92 fL (ref 78.0–100.0)
Monocytes Absolute: 0.5 10*3/uL (ref 0.1–1.0)
Monocytes Relative: 6.4 % (ref 3.0–12.0)
Neutro Abs: 5.2 10*3/uL (ref 1.4–7.7)
Neutrophils Relative %: 73.6 % (ref 43.0–77.0)
Platelets: 310 10*3/uL (ref 150.0–400.0)
Potassium: 4.6 meq/L (ref 3.5–5.1)
RBC: 3.67 M/uL — ABNORMAL LOW (ref 3.87–5.11)
RDW: 12.3 % (ref 11.5–14.6)
Sodium: 141 meq/L (ref 135–145)
TSH: 1.49 microintl units/mL (ref 0.35–5.50)
Total Bilirubin: 0.9 mg/dL (ref 0.3–1.2)
Total CHOL/HDL Ratio: 3
Total Protein: 7.6 g/dL (ref 6.0–8.3)
Triglycerides: 175 mg/dL — ABNORMAL HIGH (ref 0.0–149.0)
VLDL: 35 mg/dL (ref 0.0–40.0)
Vit D, 25-Hydroxy: 27 ng/mL — ABNORMAL LOW (ref 30–89)
WBC: 7.2 10*3/uL (ref 4.5–10.5)

## 2010-01-24 ENCOUNTER — Encounter: Admission: RE | Admit: 2010-01-24 | Discharge: 2010-01-24 | Payer: Self-pay | Admitting: Family Medicine

## 2010-01-24 LAB — HM MAMMOGRAPHY

## 2010-01-24 IMAGING — MG MM DIGITAL SCREENING
4 series · 4 of 4 positions shown · non-contrast
Comparison: none

DG SCREEN MAMMOGRAM BILATERAL
Bilateral CC and MLO view(s) were taken.

DIGITAL SCREENING MAMMOGRAM WITH CAD:
The breast tissue is almost entirely fatty.  No masses or malignant type calcifications are 
identified.  Compared with prior studies.
Images were processed with CAD.

[R CC]
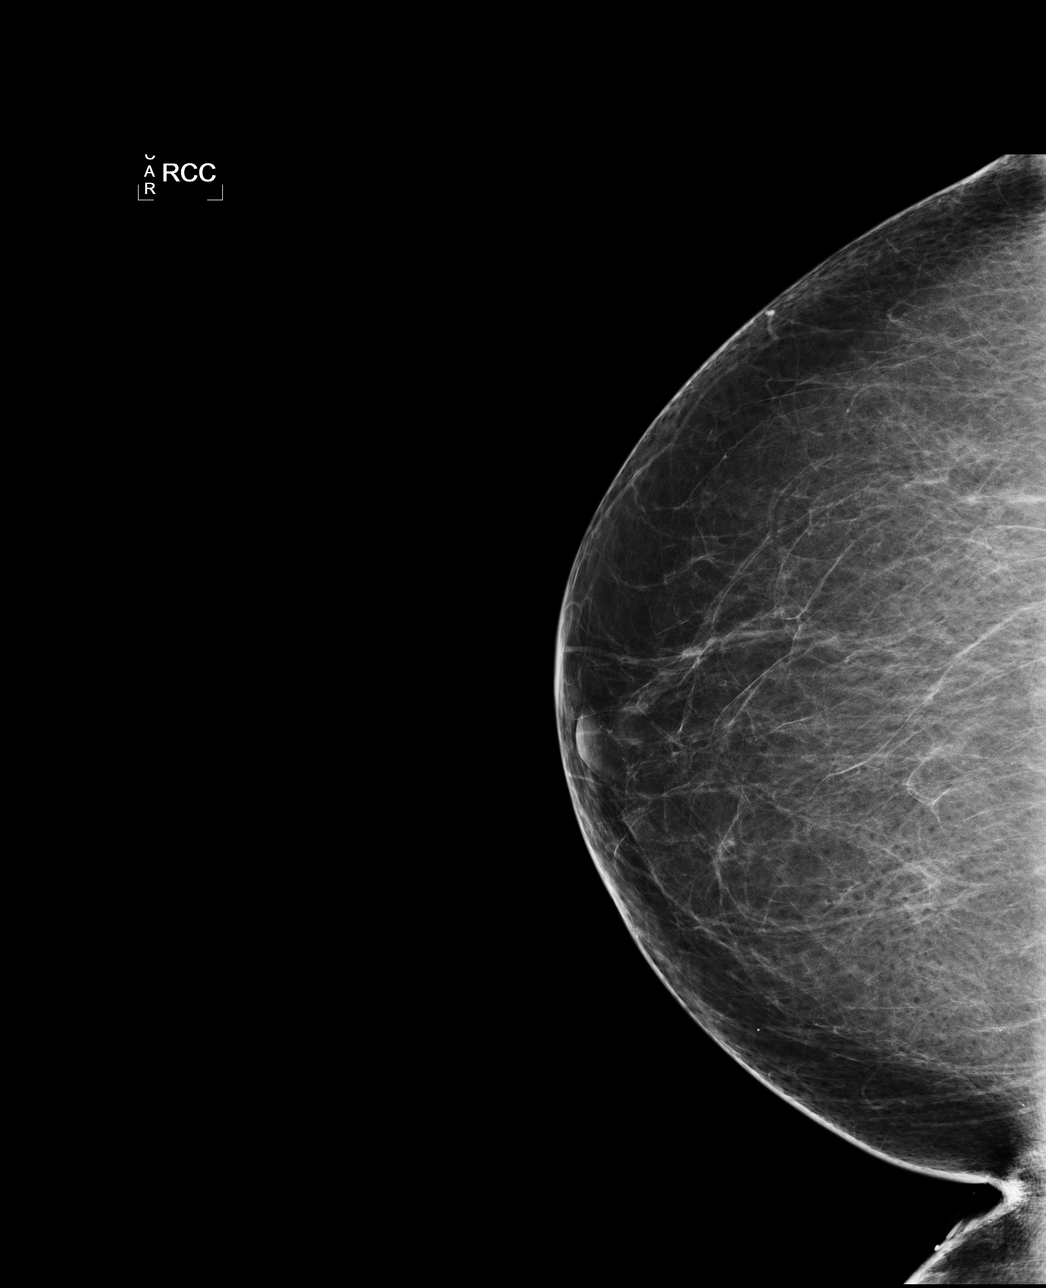

[L CC]
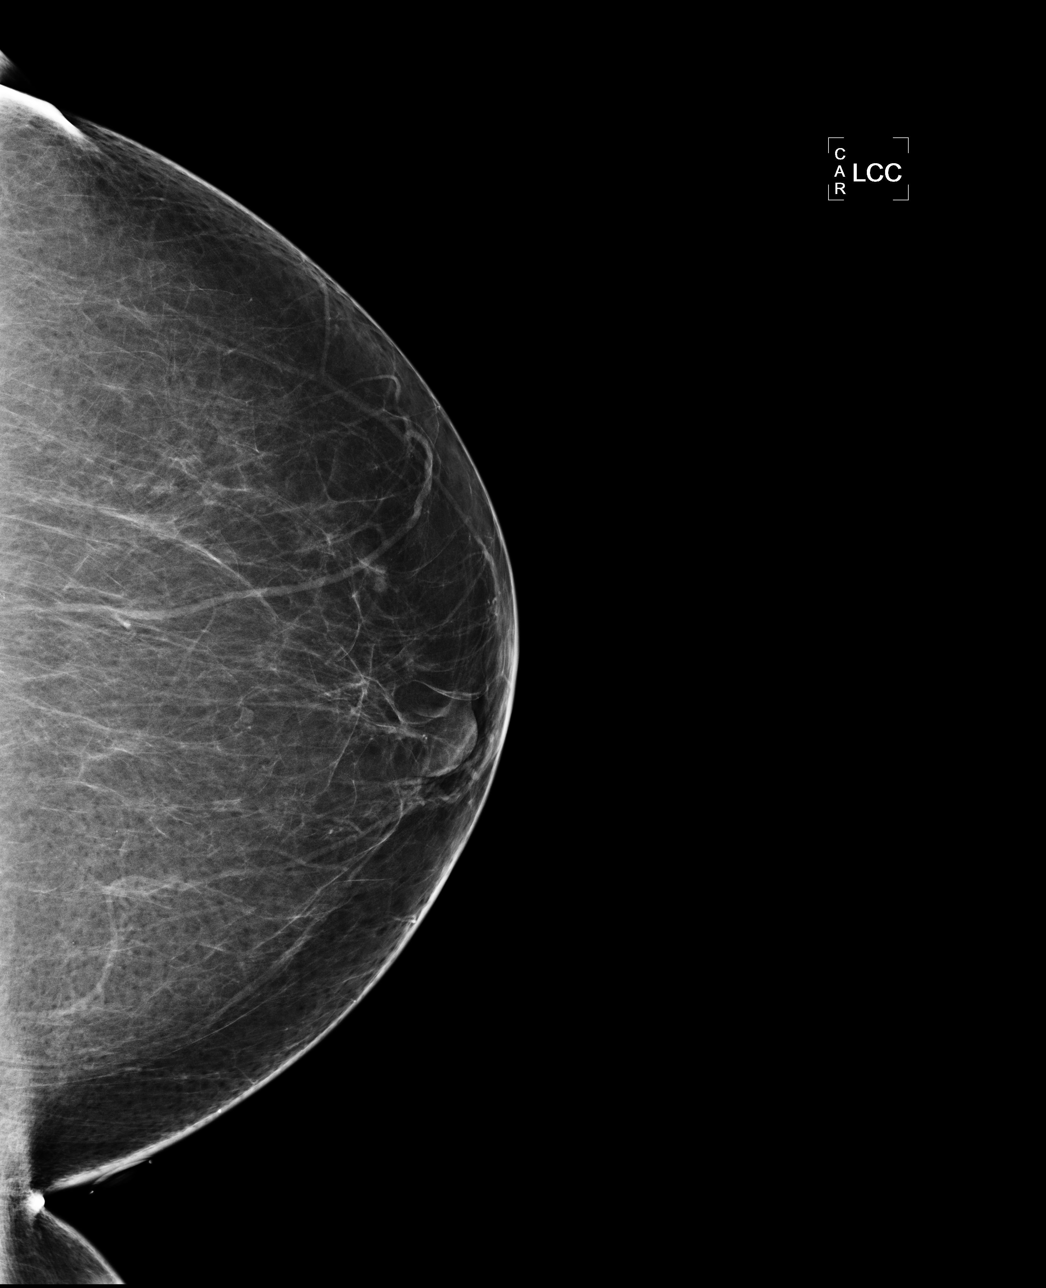

[L MLO]
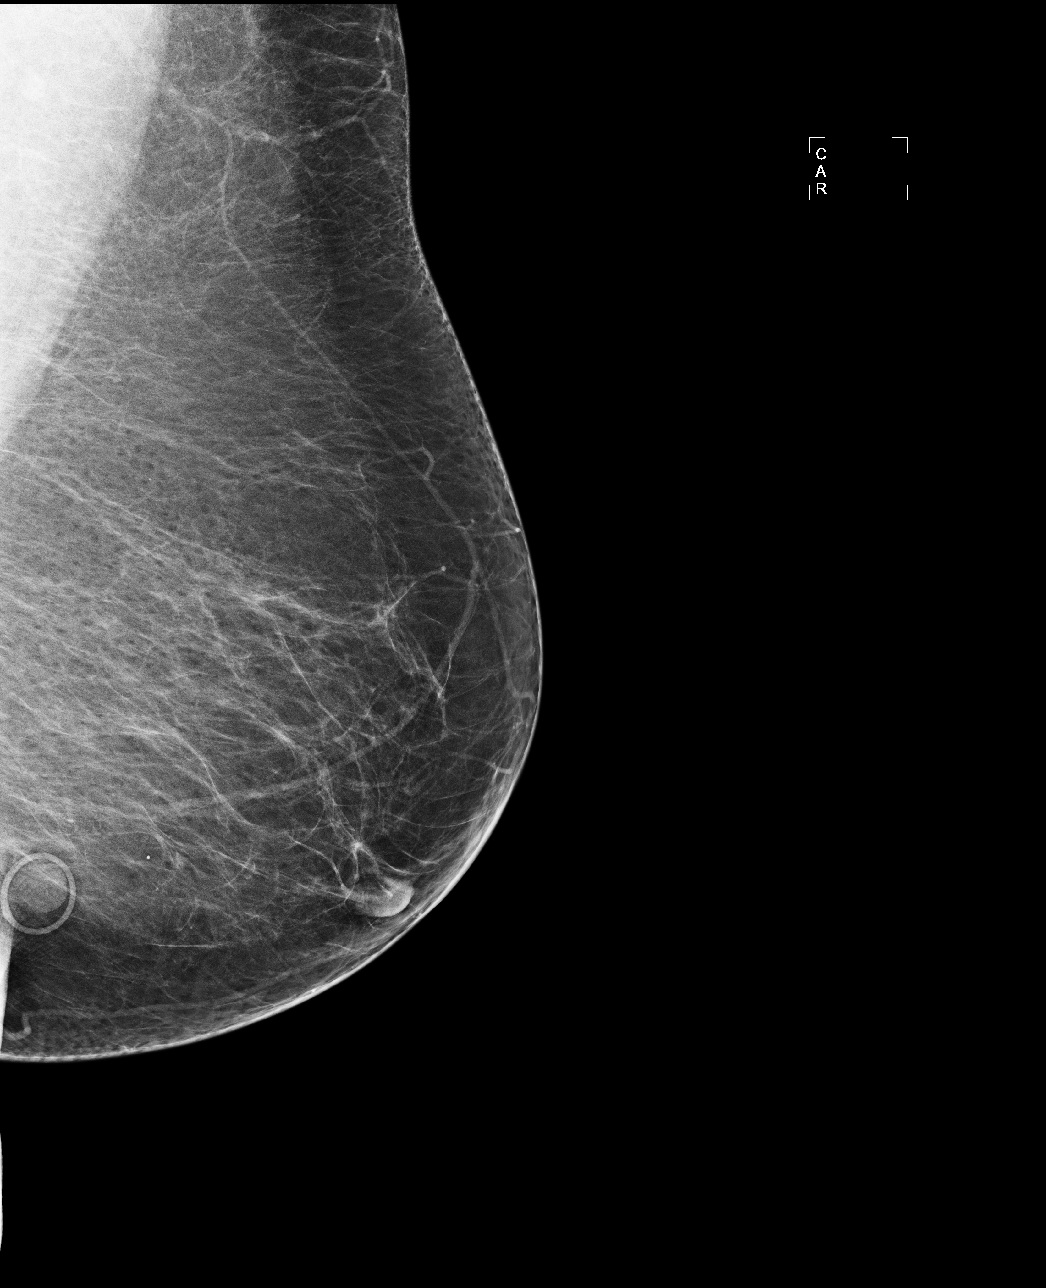

[R MLO]
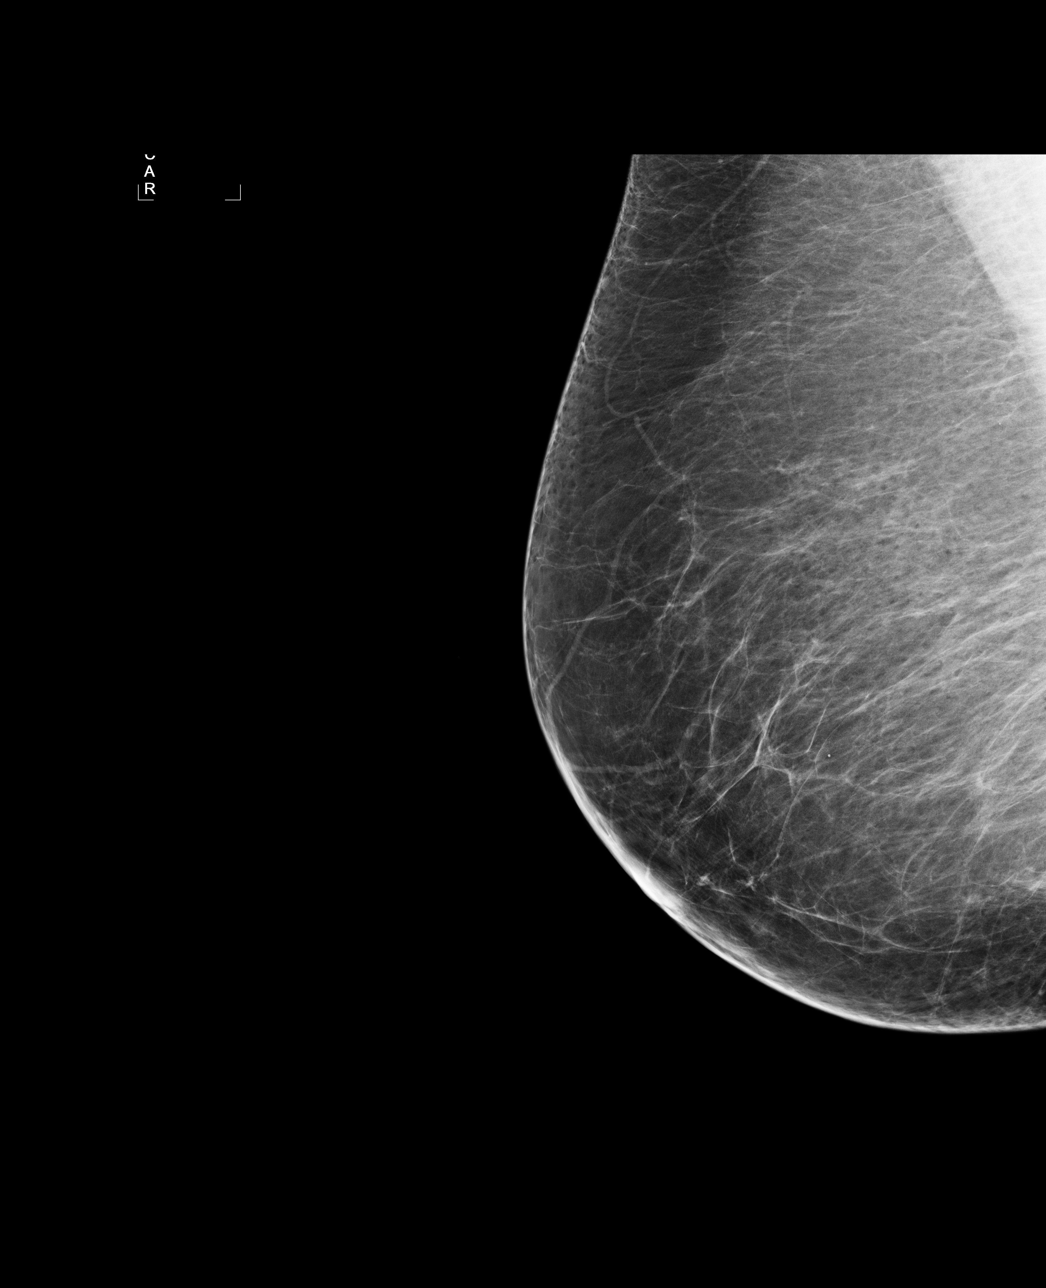

[4 of 4 positions shown; findings below may reference images not displayed]

IMPRESSION: No specific mammographic evidence of malignancy.  Next screening mammogram is recommended in one 
year.

A result letter of this screening mammogram will be mailed directly to the patient.

ASSESSMENT: Negative - BI-RADS 1

Screening mammogram in 1 year.
,

## 2010-04-18 ENCOUNTER — Ambulatory Visit: Payer: Self-pay | Admitting: Family Medicine

## 2010-04-18 DIAGNOSIS — M67919 Unspecified disorder of synovium and tendon, unspecified shoulder: Secondary | ICD-10-CM | POA: Insufficient documentation

## 2010-04-18 DIAGNOSIS — M719 Bursopathy, unspecified: Secondary | ICD-10-CM

## 2010-04-18 LAB — CONVERTED CEMR LAB: Hemoglobin: 11.7 g/dL

## 2010-05-03 LAB — CONVERTED CEMR LAB: Vit D, 25-Hydroxy: 34 ng/mL (ref 30–89)

## 2010-07-14 ENCOUNTER — Telehealth: Payer: Self-pay | Admitting: Family Medicine

## 2010-08-11 ENCOUNTER — Telehealth: Payer: Self-pay | Admitting: Family Medicine

## 2010-08-16 ENCOUNTER — Telehealth: Payer: Self-pay | Admitting: Family Medicine

## 2010-08-21 ENCOUNTER — Telehealth (INDEPENDENT_AMBULATORY_CARE_PROVIDER_SITE_OTHER): Payer: Self-pay | Admitting: *Deleted

## 2010-08-22 ENCOUNTER — Telehealth: Payer: Self-pay | Admitting: Family Medicine

## 2010-08-24 ENCOUNTER — Telehealth: Payer: Self-pay | Admitting: Family Medicine

## 2010-10-04 ENCOUNTER — Telehealth: Payer: Self-pay | Admitting: Family Medicine

## 2010-10-13 ENCOUNTER — Telehealth: Payer: Self-pay | Admitting: Family Medicine

## 2010-10-18 ENCOUNTER — Ambulatory Visit: Payer: Self-pay | Admitting: Family Medicine

## 2010-10-18 DIAGNOSIS — M1990 Unspecified osteoarthritis, unspecified site: Secondary | ICD-10-CM | POA: Diagnosis present

## 2010-10-18 DIAGNOSIS — G5600 Carpal tunnel syndrome, unspecified upper limb: Secondary | ICD-10-CM | POA: Insufficient documentation

## 2010-10-18 DIAGNOSIS — R209 Unspecified disturbances of skin sensation: Secondary | ICD-10-CM | POA: Insufficient documentation

## 2010-10-26 DIAGNOSIS — N259 Disorder resulting from impaired renal tubular function, unspecified: Secondary | ICD-10-CM | POA: Insufficient documentation

## 2010-10-26 LAB — CONVERTED CEMR LAB
ALT: 14 units/L (ref 0–35)
AST: 19 units/L (ref 0–37)
Albumin: 4.4 g/dL (ref 3.5–5.2)
Alkaline Phosphatase: 55 units/L (ref 39–117)
BUN: 33 mg/dL — ABNORMAL HIGH (ref 6–23)
Basophils Absolute: 0.1 10*3/uL (ref 0.0–0.1)
Basophils Relative: 0.8 % (ref 0.0–3.0)
Bilirubin, Direct: 0.1 mg/dL (ref 0.0–0.3)
CO2: 26 meq/L (ref 19–32)
Calcium: 10 mg/dL (ref 8.4–10.5)
Chloride: 103 meq/L (ref 96–112)
Creatinine, Ser: 1.5 mg/dL — ABNORMAL HIGH (ref 0.4–1.2)
Eosinophils Absolute: 0.3 10*3/uL (ref 0.0–0.7)
Eosinophils Relative: 3.6 % (ref 0.0–5.0)
GFR calc non Af Amer: 37.07 mL/min — ABNORMAL LOW (ref >60.00)
Glucose, Bld: 102 mg/dL — ABNORMAL HIGH (ref 70–99)
HCT: 34.5 % — ABNORMAL LOW (ref 36.0–46.0)
Hemoglobin: 11.6 g/dL — ABNORMAL LOW (ref 12.0–15.0)
Lymphocytes Relative: 13.6 % (ref 12.0–46.0)
Lymphs Abs: 1.1 10*3/uL (ref 0.7–4.0)
MCHC: 33.7 g/dL (ref 30.0–36.0)
MCV: 91.2 fL (ref 78.0–100.0)
Monocytes Absolute: 0.5 10*3/uL (ref 0.1–1.0)
Monocytes Relative: 6.2 % (ref 3.0–12.0)
Neutro Abs: 6.4 10*3/uL (ref 1.4–7.7)
Neutrophils Relative %: 75.8 % (ref 43.0–77.0)
Platelets: 383 10*3/uL (ref 150.0–400.0)
Potassium: 4.6 meq/L (ref 3.5–5.1)
RBC: 3.79 M/uL — ABNORMAL LOW (ref 3.87–5.11)
RDW: 13.1 % (ref 11.5–14.6)
Sodium: 140 meq/L (ref 135–145)
TSH: 1.7 microintl units/mL (ref 0.35–5.50)
Total Bilirubin: 0.8 mg/dL (ref 0.3–1.2)
Total Protein: 7.5 g/dL (ref 6.0–8.3)
Vitamin B-12: 526 pg/mL (ref 211–911)
WBC: 8.4 10*3/uL (ref 4.5–10.5)

## 2010-11-16 ENCOUNTER — Telehealth: Payer: Self-pay | Admitting: Family Medicine

## 2010-11-19 ENCOUNTER — Encounter: Payer: Self-pay | Admitting: Family Medicine

## 2010-11-22 ENCOUNTER — Telehealth: Payer: Self-pay | Admitting: Family Medicine

## 2010-11-23 ENCOUNTER — Other Ambulatory Visit: Payer: Self-pay | Admitting: Family Medicine

## 2010-11-23 ENCOUNTER — Ambulatory Visit
Admission: RE | Admit: 2010-11-23 | Discharge: 2010-11-23 | Payer: Self-pay | Source: Home / Self Care | Attending: Family Medicine | Admitting: Family Medicine

## 2010-11-23 DIAGNOSIS — J309 Allergic rhinitis, unspecified: Secondary | ICD-10-CM | POA: Insufficient documentation

## 2010-11-23 DIAGNOSIS — J302 Other seasonal allergic rhinitis: Secondary | ICD-10-CM | POA: Insufficient documentation

## 2010-11-23 DIAGNOSIS — N390 Urinary tract infection, site not specified: Secondary | ICD-10-CM | POA: Insufficient documentation

## 2010-11-23 LAB — CONVERTED CEMR LAB
Bilirubin Urine: NEGATIVE
Blood in Urine, dipstick: NEGATIVE
Glucose, Urine, Semiquant: NEGATIVE
Hemoglobin: 12 g/dL
Ketones, urine, test strip: NEGATIVE
Nitrite: NEGATIVE
Protein, U semiquant: NEGATIVE
Specific Gravity, Urine: <1.005
Urobilinogen, UA: 0.2
pH: 5

## 2010-11-24 LAB — BASIC METABOLIC PANEL
BUN: 40 mg/dL — ABNORMAL HIGH (ref 6–23)
CO2: 26 mEq/L (ref 19–32)
Calcium: 10.2 mg/dL (ref 8.4–10.5)
Chloride: 104 mEq/L (ref 96–112)
Creatinine, Ser: 1.3 mg/dL — ABNORMAL HIGH (ref 0.4–1.2)
GFR: 41.56 mL/min — ABNORMAL LOW (ref >60.00)
Glucose, Bld: 107 mg/dL — ABNORMAL HIGH (ref 70–99)
Potassium: 4.9 mEq/L (ref 3.5–5.1)
Sodium: 139 mEq/L (ref 135–145)

## 2010-11-26 LAB — CONVERTED CEMR LAB
ALT: 16 units/L (ref 0–35)
AST: 15 units/L (ref 0–37)
Albumin: 4.1 g/dL (ref 3.5–5.2)
Alkaline Phosphatase: 80 units/L (ref 39–117)
BUN: 24 mg/dL — ABNORMAL HIGH (ref 6–23)
Basophils Absolute: 0 10*3/uL (ref 0.0–0.1)
Basophils Relative: 0 % (ref 0.0–1.0)
Bilirubin Urine: NEGATIVE
Bilirubin, Direct: 0.1 mg/dL (ref 0.0–0.3)
Blood in Urine, dipstick: NEGATIVE
CO2: 27 meq/L (ref 19–32)
Calcium: 9.8 mg/dL (ref 8.4–10.5)
Chloride: 102 meq/L (ref 96–112)
Cholesterol: 237 mg/dL (ref 0–200)
Creatinine, Ser: 1 mg/dL (ref 0.4–1.2)
Direct LDL: 167.2 mg/dL
Eosinophils Absolute: 0.2 10*3/uL (ref 0.0–0.6)
Eosinophils Relative: 2 % (ref 0.0–5.0)
GFR calc Af Amer: 71 mL/min
GFR calc non Af Amer: 59 mL/min
Glucose, Bld: 120 mg/dL — ABNORMAL HIGH (ref 70–99)
Glucose, Urine, Semiquant: NEGATIVE
HCT: 35.5 % — ABNORMAL LOW (ref 36.0–46.0)
HDL: 37.2 mg/dL — ABNORMAL LOW (ref >39.0)
Hemoglobin: 12.2 g/dL (ref 12.0–15.0)
Ketones, urine, test strip: NEGATIVE
Lymphocytes Relative: 16.4 % (ref 12.0–46.0)
MCHC: 34.4 g/dL (ref 30.0–36.0)
MCV: 88 fL (ref 78.0–100.0)
Monocytes Absolute: 0.6 10*3/uL (ref 0.2–0.7)
Monocytes Relative: 7.2 % (ref 3.0–11.0)
Neutro Abs: 5.8 10*3/uL (ref 1.4–7.7)
Neutrophils Relative %: 74.4 % (ref 43.0–77.0)
Nitrite: POSITIVE
Platelets: 427 10*3/uL — ABNORMAL HIGH (ref 150–400)
Potassium: 4 meq/L (ref 3.5–5.1)
Protein, U semiquant: NEGATIVE
RBC: 4.04 M/uL (ref 3.87–5.11)
RDW: 13.1 % (ref 11.5–14.6)
Sodium: 137 meq/L (ref 135–145)
Specific Gravity, Urine: 1.015
TSH: 1.39 microintl units/mL (ref 0.35–5.50)
Total Bilirubin: 1.1 mg/dL (ref 0.3–1.2)
Total CHOL/HDL Ratio: 6.4
Total Protein: 7.6 g/dL (ref 6.0–8.3)
Triglycerides: 189 mg/dL — ABNORMAL HIGH (ref 0–149)
Urobilinogen, UA: 0.2
VLDL: 38 mg/dL (ref 0–40)
WBC: 7.9 10*3/uL (ref 4.5–10.5)
pH: 5

## 2010-11-28 NOTE — Progress Notes (Signed)
  Phone Note Refill Request Message from:  Fax from Pharmacy on August 22, 2010 11:18 AM  Refills Requested: Medication #1:  SIMVASTATIN 80 MG TABS 1 by mouth once daily   Dosage confirmed as above?Dosage Confirmed Initial call taken by: Gardenia Phlegm RMA,  August 22, 2010 11:19 AM    Prescriptions: SIMVASTATIN 80 MG TABS (SIMVASTATIN) 1 by mouth once daily  #30 x 1   Entered by:   Gardenia Phlegm RMA   Authorized by:   Penni Homans MD   Signed by:   Gardenia Phlegm RMA on 08/22/2010   Method used:   Electronically to        [facility] (retail)       [place] [address]       [place], [place]  [address]       Ph: [phone]       Fax: [phone]   RxID:   [id number]

## 2010-11-28 NOTE — Progress Notes (Signed)
Summary: Benicar and Simvastatin refill  Phone Note Refill Request Message from:  Fax from Pharmacy on August 11, 2010 4:10 PM  Refills Requested: Medication #1:  BENICAR HCT 40-12.5 MG TABS 1 by mouth once daily From [facility]  Initial call taken by: Gardenia Phlegm RMA,  August 11, 2010 4:10 PM    Prescriptions: SIMVASTATIN 80 MG TABS (SIMVASTATIN) 1 by mouth once daily  #90 x 0   Entered by:   Gardenia Phlegm RMA   Authorized by:   Penni Homans MD   Signed by:   Gardenia Phlegm RMA on 08/11/2010   Method used:   Electronically to        [facility] ([facility]order)       [facility]       [address] [place]  [address]       Ph: [phone]       Fax: [phone]   RxID:   [id number] [place] HCT 40-12.5 MG TABS (OLMESARTAN MEDOXOMIL-HCTZ) 1 by mouth once daily  #90 x 0   Entered by:   Gardenia Phlegm RMA   Authorized by:   Penni Homans MD   Signed by:   Gardenia Phlegm RMA on 08/11/2010   Method used:   Electronically to        [facility] (mail-order)       [facility]       [address] [place]  [address]       Ph: [phone]       Fax: [phone]   [employer]:   [phone]

## 2010-11-28 NOTE — Progress Notes (Signed)
  Phone Note Refill Request Message from:  Fax from Pharmacy on August 22, 2010 11:17 AM  Refills Requested: Medication #1:  BENICAR HCT 40-12.5 MG TABS 1 by mouth once daily   Dosage confirmed as above?Dosage Confirmed  Medication #2:  NORVASC 5 MG  TABS 1 once daily  for BP   Dosage confirmed as above?Dosage Confirmed Initial call taken by: Gardenia Phlegm RMA,  August 22, 2010 11:18 AM    Prescriptions: BENICAR HCT 40-12.5 MG TABS (OLMESARTAN MEDOXOMIL-HCTZ) 1 by mouth once daily  #30 x 1   Entered by:   Gardenia Phlegm RMA   Authorized by:   Penni Homans MD   Signed by:   Gardenia Phlegm RMA on 08/22/2010   Method used:   Electronically to        [facility] (retail)       [place] [address] [place], [place]  [address]       Ph: [phone]       Fax: [phone]   [id number] NORVASC 5 MG  TABS (AMLODIPINE BESYLATE) 1 once daily  for BP  #30 x 1   Entered by:   Gardenia Phlegm RMA   Authorized by:   Penni Homans MD   Signed by:   Gardenia Phlegm RMA on 08/22/2010   Method used:   Electronically to        [facility] (retail)       [place] [address]       [place], [place]  [address]       Ph: [phone]       Fax: [phone]   RxIDIT:[id number]

## 2010-11-28 NOTE — Progress Notes (Signed)
Summary: [facility] called req new scripts.  Phone Note From Pharmacy Call back at [phone] [facility]    Caller: [name]  Summary of Call:  Need new scripts for home delivery Amlodipine, Simvastatin and Benicar.. Fax # [phone] Initial call taken by: Braulio Bosch,  August 16, 2010 9:58 AM  Follow-up for Phone Call        can give 90 day supply of all three but without refills cuz she needs her annual blood work and a physical to check her bp, cholesterol, anemia and altered renal function Follow-up by: Penni Homans MD,  August 16, 2010 12:32 PM    Prescriptions: NORVASC 5 MG  TABS (AMLODIPINE BESYLATE) 1 once daily  for BP  #90 x 0   Entered by:   Gardenia Phlegm RMA   Authorized by:   Penni Homans MD   Signed by:   Gardenia Phlegm RMA on 08/16/2010   Method used:   Electronically to        [facility] (mail-order)       [facility]       [address] [place]  [address]       Ph: [phone]       Fax: [phone]   RxID:   [id number] [place] HCT 40-12.5 MG TABS (OLMESARTAN MEDOXOMIL-HCTZ) 1 by mouth once daily  #90 x 0   Entered by:   Gardenia Phlegm RMA   Authorized by:   Penni Homans MD   Signed by:   Gardenia Phlegm RMA on 08/16/2010   Method used:   Electronically to        [facility] (mail-order)       [facility]       [address] [place]  [address]       Ph: [phone]       Fax: [phone]   [place]:   [id number] SIMVASTATIN 80 MG TABS (SIMVASTATIN) 1 by mouth once daily  #90 x 0   Entered by:   Gardenia Phlegm RMA   Authorized by:   Penni Homans MD   Signed by:   Gardenia Phlegm RMA on 08/16/2010   Method used:   Electronically to        [facility] (mail-order)       [facility]       [address] [place]  [address]       Ph: [phone]       Fax:  [phone]   [place]:   [phone]

## 2010-11-28 NOTE — Assessment & Plan Note (Signed)
Summary: fu on bp/njr   Vital Signs:  Patient Profile:   70 Years Old Female Weight:      223 pounds Temp:     98 degrees F Pulse rate:   76 / minute BP sitting:   156 / 80  (left arm) Cuff size:   large  Vitals Entered By: Levora Angel, RN (May 20, 2008 11:22 AM)                 Chief Complaint:  reck bp.  History of Present Illness: BP was 126/75 this AM at [facility], checked in office x 3 and was 156/80, 150/8 Pt has chronic rash over loer legs, morphea bby dermatologist doing well in general needs BP rx refilled or sples benicar40-12.5  having difficult time losing weight, to get good diet if possible    Current Allergies (reviewed today): ! WELLBUTRIN (BUPROPION HCL) ! SULFAMETHOXAZOLE (SULFAMETHOXAZOLE)     Review of Systems      See HPI   Physical Exam  General:     Well-developed,well-nourished,in no acute distress; alert,appropriate and cooperative throughout examinationoverweight-appearing.   Lungs:     Normal respiratory effort, chest expands symmetrically. Lungs are clear to auscultation, no crackles or wheezes. Heart:     Normal rate and regular rhythm. S1 and S2 normal without gallop, murmur, click, rub or other extra sounds. Abdomen:     obese, no tenderness Msk:     erytnematous chronic dark rash Pulses:     R and L carotid,radial,femoral,dorsalis pedis and posterior tibial pulses are full and equal bilaterally Extremities:     No clubbing, cyanosis, edema, or deformity noted with normal full range of motion of all joints.   Neurologic:     No cranial nerve deficits noted. Station and gait are normal. Plantar reflexes are down-going bilaterally. DTRs are symmetrical throughout. Sensory, motor and coordinative functions appear intact.    Impression & Recommendations:  Problem # 1:  HYPERTENSION (ICD-401.9) Assessment: Unchanged  Her updated medication list for this problem includes:    Norvasc 5 Mg Tabs (Amlodipine  besylate) .Marland Kitchen... 1 once daily  for bp    Benicar Hct 40-12.5 Mg Tabs (Olmesartan medoxomil-hctz) .Marland Kitchen... 1 qd   Problem # 2:  DERMATITIS, CHRONIC (ICD-692.9) Assessment: Unchanged  Problem # 3:  EDEMA (ICD-782.3) Assessment: Improved  Her updated medication list for this problem includes:    Benicar Hct 40-12.5 Mg Tabs (Olmesartan medoxomil-hctz) .Marland Kitchen... 1 qd   Problem # 4:  ARTHRITIS, HX OF (ICD-V13.4) Assessment: Improved  Complete Medication List: 1)  Preservision/lutein Caps (Multiple vitamins-minerals) 2)  Zolpidem Tartrate 10 Mg Tabs (Zolpidem tartrate) .... Take at bedtime as needed 3)  Norvasc 5 Mg Tabs (Amlodipine besylate) .Marland Kitchen.. 1 once daily  for bp 4)  Benicar Hct 40-12.5 Mg Tabs (Olmesartan medoxomil-hctz) .Marland Kitchen.. 1 qd 5)  Lovaza 1 Gm Caps (Omega-3-acid ethyl esters) .Marland Kitchen.. 1 by mouth two times a day   Patient Instructions: 1)  continue Benicar 40-HCT 12.5 for BP 2)  return for physical examination   ]

## 2010-11-28 NOTE — Progress Notes (Signed)
Summary: Simvastain change  Phone Note Refill Request Message from:  Fax from Pharmacy on August 24, 2010 8:25 AM     New/Updated Medications: SIMVASTATIN 20 MG TABS (SIMVASTATIN) once daily Prescriptions: SIMVASTATIN 20 MG TABS (SIMVASTATIN) once daily  #30 x 3   Entered by:   Gardenia Phlegm RMA   Authorized by:   Penni Homans MD   Signed by:   Gardenia Phlegm RMA on 08/24/2010   Method used:   Electronically to        [facility] (retail)       [place] [address] [place], [place]  [address]       Ph: [phone]       Fax: [phone]   [id number]

## 2010-11-28 NOTE — Assessment & Plan Note (Signed)
Summary: FU ON BP/NJR   Vital Signs:  Patient Profile:   70 Years Old Female Weight:      218 pounds Temp:     98.2 degrees F Pulse rate:   80 / minute BP sitting:   184 / 90  (left arm) Cuff size:   large  Vitals Entered By: Levora Angel, RN (February 19, 2008 11:48 AM)                 Chief Complaint:  reck bp .  History of Present Illness: Pt has not seen any improvement of BP since last visit ,however stopped the benicar HCT no other complaints discussed low salt diet and weight reduction also increase exercise    Current Allergies: ! WELLBUTRIN (BUPROPION HCL) ! SULFAMETHOXAZOLE (SULFAMETHOXAZOLE)     Review of Systems      See HPI   Physical Exam  General:     Well-developed,well-nourished,in no acute distress; alert,appropriate and cooperative throughout examinationoverweight-appearing.   Neck:     No deformities, masses, or tenderness noted. Lungs:     Normal respiratory effort, chest expands symmetrically. Lungs are clear to auscultation, no crackles or wheezes. Heart:     Normal rate and regular rhythm. S1 and S2 normal without gallop, murmur, click, rub or other extra sounds. Pulses:     R and L carotid,radial,femoral,dorsalis pedis and posterior tibial pulses are full and equal bilaterally Extremities:     left pretibial edema and right pretibial edema.      Impression & Recommendations:  Problem # 1:  HYPERTENSION (ICD-401.9) Assessment: Unchanged  Her updated medication list for this problem includes:    Benicar Hct 40-12.5 Mg Tabs (Olmesartan medoxomil-hctz) ..... Once daily    Norvasc 5 Mg Tabs (Amlodipine besylate) .Marland Kitchen... 1 once daily  for bp   Problem # 2:  EDEMA (ICD-782.3) Assessment: Deteriorated  Her updated medication list for this problem includes:    Benicar Hct 40-12.5 Mg Tabs (Olmesartan medoxomil-hctz) .Marland Kitchen... 1 qd   Complete Medication List: 1)  Preservision/lutein Caps (Multiple vitamins-minerals) 2)  Zolpidem  Tartrate 10 Mg Tabs (Zolpidem tartrate) .... Take at bedtime as needed 3)  Vitamin D 50000 Unit Caps (Ergocalciferol) .... Take one tablet every week for 8 weeks. call office after completion for recheck vit d level 4)  Norvasc 5 Mg Tabs (Amlodipine besylate) .Marland Kitchen.. 1 once daily  for bp 5)  Benicar Hct 40-12.5 Mg Tabs (Olmesartan medoxomil-hctz) .Marland Kitchen.. 1 qd   Patient Instructions: 1)  add benicar hct back to your list of meicine  2)  Norvasc 5 mg once daily 3)  decrease salt in your diet  4)  return 6 weeks    ]

## 2010-11-28 NOTE — Progress Notes (Signed)
Summary: needs order for bone density test  Phone Note Call from Patient Call back at Home Phone [phone]   Caller: patient  Call For: [place] Summary of Call: needs bone density test  wants done at [facility] please  fax them an order for this test  [phone] please let patient know when you have faxed this order Initial call taken by: Shanon Payor,  October 01, 2007 4:32 PM  Follow-up for Phone Call        order faxed and pt aware.  Follow-up by: Levora Angel, RN,  October 01, 2007 5:21 PM

## 2010-11-28 NOTE — Assessment & Plan Note (Signed)
Summary: arm pain x a few months--will fast/ccm   Vital Signs:  Patient profile:   70 year old female Weight:      217 pounds O2 Sat:      98 % Temp:     98.5 degrees F Pulse rate:   92 / minute Pulse rhythm:   regular BP sitting:   156 / 80  (left arm) Cuff size:   large  Vitals Entered By: Levora Angel, RN (April 18, 2010 9:15 AM) CC: c/o left shoulder pain refill xanax . c/o pain left hip area.   History of Present Illness: repeat BP 10/58 This 70 year old white divorced female is in today complaining of pain in the left shoulder pain radiating into the left upper arm area she has had this problem previously but this is been bothered for the last 2 weeks. Patient on simvastatin 80 mg but does not have any muscle aches or pains will continue same until she comes in for followup lipid as noted above blood pressure is doing fine Other than the shoulder pain patient relates she is doing well with no complaints these refill alprazolam  Allergies: 1)  ! Wellbutrin (Bupropion Hcl) 2)  ! Sulfamethoxazole (Sulfamethoxazole)  Past History:  Past Medical History: Last updated: 06/19/2007 Hypertension  Risk Factors: Smoking Status: never (06/02/2007)  Review of Systems      See HPI  The patient denies anorexia, fever, weight loss, weight gain, vision loss, decreased hearing, hoarseness, chest pain, syncope, dyspnea on exertion, peripheral edema, prolonged cough, headaches, hemoptysis, abdominal pain, melena, hematochezia, severe indigestion/heartburn, hematuria, incontinence, genital sores, muscle weakness, suspicious skin lesions, transient blindness, difficulty walking, depression, unusual weight change, abnormal bleeding, enlarged lymph nodes, angioedema, breast masses, and testicular masses.    Physical Exam  General:  Well-developed,well-nourished,in no acute distress; alert,appropriate and cooperative throughout examination Lungs:  Normal respiratory effort, chest expands  symmetrically. Lungs are clear to auscultation, no crackles or wheezes. Heart:  Normal rate and regular rhythm. S1 and S2 normal without gallop, murmur, click, rub or other extra sounds. Msk:  tenderness over the left subdeltoid bursal, limitation on hyperextension of arm is also in pain   Impression & Recommendations:  Problem # 1:  BURSITIS, SUBDELTOID (ICD-726.10) Assessment New  injected the bursa with Xylocaine +120 mg Depo-Medrol Start on diclofenac 75 mg b.i.d. p.c.  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 1 once daily for 3 months hemoglobin 11.7 restart tandem +4 tumor Orders: Hgb [id number]) Venipuncture IM:[id number]) Fingerstick JZ:[id number])  Complete Medication List: 1)  Norvasc 5 Mg Tabs (Amlodipine besylate) .Marland Kitchen.. 1 once daily  for bp 2)  Oscal 500/200 D-3 500-200 Mg-unit Tabs (Calcium-vitamin d) .... Two times a day 3)  Bayer Aspirin Ec Low Dose 81 Mg Tbec (Aspirin) .... Once daily 4)  Simvastatin 80 Mg Tabs (Simvastatin) .Marland Kitchen.. 1 by mouth once daily 5)  Benicar Hct 40-12.5 Mg Tabs (Olmesartan medoxomil-hctz) .Marland Kitchen.. 1 by mouth once daily 6)  Viteyes Areds Advanced Caps (Multiple vitamins-minerals) 7)  Diclofenac Sodium 75 Mg Tbec (Diclofenac sodium) .Marland Kitchen.. 1 bidpc for arthritis and  bursitis 8)  Alprazolam 0.5 Mg Tabs (Alprazolam) .Marland Kitchen.. 1 morn, midaternoon and 2 hs  Patient Instructions: 1)  subdeltoid bursitis 2)  Injecting with Xylocaine and Depo-Medrol 3)  Heart  diclofenac 75 mg twice daily after meal 4)  2 daily exercises as shown regarding movement of the left arm to prevent limitation of movement 5)  hemoglobin 11.7 his are restart tandem plus for 2 months Prescriptions: ALPRAZOLAM 0.5 MG TABS (ALPRAZOLAM) 1 morn, midaternoon and 2 hs  #120 x 5   Entered and Authorized by:   Emeterio Reeve MD   Signed by:   Emeterio Reeve MD on 04/18/2010   Method used:   Print then Give to Patient   RxID:   [id number] DICLOFENAC SODIUM 75 MG TBEC (DICLOFENAC SODIUM) 1 bidpc for arthritis and bursitis  #60 x 11   Entered and Authorized by:   Emeterio Reeve MD   Signed by:   Emeterio Reeve MD on 04/18/2010   Method used:   Electronically to        [facility] (retail)       [place] [address]       [place], [place]  [address]       Ph: [phone]       Fax: [phone]   RxID:   [id number]   Laboratory Results   Blood Tests   Date/Time Recieved: April 18, 2010 10:48 AM  Date/Time Reported: April 18, 2010 10:48 AM    CBC HGB:  11.7 g/dL   (Normal Range: 13.0-17.0 in Males, 12.0-15.0 in Females) Comments: Doy Hutching, CMA  April 18, 2010 10:48 AM      Appended Document: arm pain x a few months--will fast/ccm     Allergies: 1)  ! Wellbutrin (Bupropion Hcl) 2)  ! Sulfamethoxazole (Sulfamethoxazole)   Complete Medication List: 1)  Norvasc 5 Mg Tabs (Amlodipine besylate) .Marland Kitchen.. 1 once daily  for bp 2)  Oscal 500/200 D-3 500-200 Mg-unit Tabs (Calcium-vitamin d) .... Two times a day 3)  Bayer Aspirin Ec Low Dose 81 Mg Tbec (Aspirin) .... Once daily 4)  Simvastatin 80 Mg Tabs (Simvastatin) .Marland Kitchen.. 1 by mouth once daily 5)  Benicar Hct 40-12.5 Mg Tabs (Olmesartan medoxomil-hctz) .Marland Kitchen.. 1 by mouth once daily 6)  Viteyes Areds Advanced Caps (Multiple vitamins-minerals) 7)  Diclofenac Sodium 75 Mg Tbec (Diclofenac sodium) .Marland Kitchen.. 1 bidpc for arthritis and bursitis 8)  Alprazolam 0.5 Mg  Tabs (Alprazolam) .Marland Kitchen.. 1 morn, midaternoon and 2 hs  Appended Document: arm pain x a few months--will fast/ccm     Allergies: 1)  ! Wellbutrin (Bupropion Hcl) 2)  ! Sulfamethoxazole (Sulfamethoxazole)   Impression & Recommendations:  Problem # 1:  BURSITIS, SUBDELTOID (ICD-726.10) Assessment New Cleansed area over leftm shoulder with betadeine Loca; anesthesia with 1% Lidocaine Injected Bursa with Depomedrol 120 mg plus 1 1/2 cc lidocaine Dressed injection site  Complete Medication List: 1)  Norvasc 5 Mg Tabs (Amlodipine besylate) .Marland Kitchen.. 1 once daily  for bp 2)  Oscal 500/200 D-3 500-200 Mg-unit Tabs (Calcium-vitamin d) .... Two times a day 3)  Bayer Aspirin Ec Low Dose 81 Mg Tbec (Aspirin) .... Once daily 4)  Simvastatin 80 Mg Tabs (Simvastatin) .Marland Kitchen.. 1 by mouth once daily 5)  Benicar Hct 40-12.5 Mg Tabs (Olmesartan medoxomil-hctz) .Marland Kitchen.. 1 by mouth once daily 6)  Viteyes Areds Advanced Caps (Multiple vitamins-minerals) 7)  Diclofenac Sodium 75 Mg Tbec (Diclofenac  sodium) .Marland Kitchen.. 1 bidpc for arthritis and bursitis 8)  Alprazolam 0.5 Mg Tabs (Alprazolam) .Marland Kitchen.. 1 morn, midaternoon and 2 hs

## 2010-11-28 NOTE — Progress Notes (Signed)
  Phone Note Call from Patient   Summary of Call: Pt called and just asked for a call back? Will call pt in the morning Initial call taken by: Gardenia Phlegm RMA,  August 21, 2010 5:17 PM  Follow-up for Phone Call        Pt states she just had concerns on when Dr Joni Fears would be back because she was due for a physical. Pt states she scheduled a physical for the end of December. Follow-up by: Gardenia Phlegm RMA,  August 22, 2010 8:05 AM

## 2010-11-28 NOTE — Assessment & Plan Note (Signed)
Summary: cpx will come fasting/mhf   Vital Signs:  Patient Profile:   70 Years Old Female Weight:      223 pounds O2 Sat:      96 % Temp:     97.9 degrees F Pulse rate:   86 / minute BP sitting:   160 / 80  (left arm) Cuff size:   large  Vitals Entered By: Levora Angel, RN (July 20, 2008 9:58 AM)                 Chief Complaint:  yrly ck up .  History of Present Illness: Pt in for yearly evaliuation complains of pain left upper arm infrequently,rt anle pain at times, concerned about hemorrhoids other wise no other complaints Needs mammogram and colonoscopic exam i  Nov 09 otherwise no complaints exercising 3 times per week, walking rechecked  BP 130/80 to start weight reduction diet     Current Allergies (reviewed today): ! WELLBUTRIN (BUPROPION HCL) ! SULFAMETHOXAZOLE (SULFAMETHOXAZOLE)    Risk Factors:  PAP Smear History:     Date of Last PAP Smear:  04/04/2006    Results:  Normal    Review of Systems      See HPI  General      Denies chills, fatigue, fever, loss of appetite, malaise, sleep disorder, sweats, weakness, and weight loss.  Eyes      Denies blurring, discharge, double vision, eye irritation, eye pain, halos, itching, light sensitivity, red eye, vision loss-1 eye, and vision loss-both eyes.  ENT      Denies decreased hearing, difficulty swallowing, ear discharge, earache, hoarseness, nasal congestion, nosebleeds, postnasal drainage, ringing in ears, sinus pressure, and sore throat.  CV      Denies bluish discoloration of lips or nails, chest pain or discomfort, difficulty breathing at night, difficulty breathing while lying down, fainting, fatigue, leg cramps with exertion, lightheadness, near fainting, palpitations, shortness of breath with exertion, swelling of feet, swelling of hands, and weight gain.      BP elevated  Resp      Denies chest discomfort, chest pain with inspiration, cough, coughing up blood, excessive snoring,  hypersomnolence, morning headaches, pleuritic, shortness of breath, sputum productive, and wheezing.  GI      Denies abdominal pain, bloody stools, change in bowel habits, constipation, dark tarry stools, diarrhea, excessive appetite, gas, hemorrhoids, indigestion, loss of appetite, nausea, vomiting, vomiting blood, and yellowish skin color.  GU      Denies abnormal vaginal bleeding, decreased libido, discharge, dysuria, genital sores, hematuria, incontinence, nocturia, urinary frequency, and urinary hesitancy.  MS      Denies joint pain, joint redness, joint swelling, loss of strength, low back pain, mid back pain, muscle aches, muscle , cramps, muscle weakness, stiffness, and thoracic pain.  Derm      Complains of lesion(s).      on face  Neuro      Denies brief paralysis, difficulty with concentration, disturbances in coordination, falling down, headaches, inability to speak, memory loss, numbness, poor balance, seizures, sensation of room spinning, tingling, tremors, visual disturbances, and weakness.  Psych      Denies alternate hallucination ( auditory/visual), anxiety, depression, easily angered, easily tearful, irritability, mental problems, panic attacks, sense of great danger, suicidal thoughts/plans, thoughts of violence, unusual visions or sounds, and thoughts /plans of harming others.  Endo      Denies cold intolerance, excessive hunger, excessive thirst, excessive urination, heat intolerance, polyuria, and weight change.  Heme  Denies abnormal bruising, bleeding, enlarge lymph nodes, fevers, pallor, and skin discoloration.  Allergy      Denies hives or rash, itching eyes, persistent infections, seasonal allergies, and sneezing.   Physical Exam  General:     Well-developed,well-nourished,in no acute distress; alert,appropriate and cooperative throughout examinationoverweight-appearing.   Head:     Normocephalic and atraumatic without obvious abnormalities. No  apparent alopecia or balding. Eyes:     No corneal or conjunctival inflammation noted. EOMI. Perrla. Funduscopic exam benign, without hemorrhages, exudates or papilledema. Vision grossly normal. Ears:     External ear exam shows no significant lesions or deformities.  Otoscopic examination reveals clear canals, tympanic membranes are intact bilaterally without bulging, retraction, inflammation or discharge. Hearing is grossly normal bilaterally. Nose:     External nasal examination shows no deformity or inflammation. Nasal mucosa are pink and moist without lesions or exudates. Mouth:     Oral mucosa and oropharynx without lesions or exudates.  Teeth in good repair. Neck:     No deformities, masses, or tenderness noted. Chest Wall:     No deformities, masses, or tenderness noted. Breasts:     No mass, nodules, thickening, tenderness, bulging, retraction, inflamation, nipple discharge or skin changes noted.   Lungs:     Normal respiratory effort, chest expands symmetrically. Lungs are clear to auscultation, no crackles or wheezes. Heart:     Normal rate and regular rhythm. S1 and S2 normal without gallop, murmur, click, rub or other extra sounds. Abdomen:     Bowel sounds positive,abdomen soft and non-tender without masses, organomegaly or hernias noted. Rectal:     skin tags no hemorrhoids Genitalia:     Normal introitus for age, no external lesions, no vaginal discharge, mucosa pink and moist, no vaginal or cervical lesions, no vaginal atrophy, no friaility or hemorrhage, normal uterus size and position, no adnexal masses or tenderness Msk:     No deformity or scoliosis noted of thoracic or lumbar spine.   Pulses:     R and L carotid,radial,femoral,dorsalis pedis and posterior tibial pulses are full and equal bilaterally Extremities:     No clubbing, cyanosis, edema, or deformity noted with normal full range of motion of all joints.   Neurologic:     No cranial nerve deficits noted.  Station and gait are normal. Plantar reflexes are down-going bilaterally. DTRs are symmetrical throughout. Sensory, motor and coordinative functions appear intact. Skin:     scar left lower leg from previous varicose ulceration Cervical Nodes:     No lymphadenopathy noted Axillary Nodes:     No palpable lymphadenopathy Inguinal Nodes:     No significant adenopathy Psych:     Cognition and judgment appear intact. Alert and cooperative with normal attention span and concentration. No apparent delusions, illusions, hallucinations    Impression & Recommendations:  Problem # 1:  ANXIETY (ICD-300.00) Assessment: New Flu Vaccine Consent Questions     Do you have a history of severe allergic reactions to this vaccine? no    Any prior history of allergic reactions to egg and/or gelatin? no    Do you have a sensitivity to the preservative Thimersol? no    Do you have a past history of Guillan-Barre Syndrome? no    Do you currently have an acute febrile illness? no    Have you ever had a severe reaction to latex? no    Vaccine information given and explained to patient? yes    Are you currently pregnant? no  Lot Number:[id number]      -------------  given by    Levora Angel, RN  July 20, 2008 10:02 AM    Site Given  Left Deltoid IM     Her updated medication list for this problem includes:    Alprazolam 0.25 Mg Tbdp (Alprazolam) .Marland Kitchen... 1 morn, midafternoon and 2 hs for stress and sleep   Problem # 2:  DERMATITIS, CHRONIC (ICD-692.9) Assessment: Unchanged  Problem # 3:  HYPERCHOLESTEROLEMIA (ICD-272.0) Assessment: Deteriorated  Her updated medication list for this problem includes:    Lovaza 1 Gm Caps (Omega-3-acid ethyl esters) .Marland Kitchen... 1 by mouth two times a day  Orders: Venipuncture [id number]) TLB-Lipid Panel (80061-LIPID)   Problem # 4:  HYPERTENSION (ICD-401.9) Assessment: Deteriorated  Her updated medication list for this problem includes:    Norvasc 5 Mg Tabs  (Amlodipine besylate) .Marland Kitchen... 1 once daily  for bp    Benicar Hct 40-12.5 Mg Tabs (Olmesartan medoxomil-hctz) .Marland Kitchen... 1 qd to lose wt and increase exercise to better control BP  Complete Medication List: 1)  Preservision/lutein Caps (Multiple vitamins-minerals) 2)  Zolpidem Tartrate 10 Mg Tabs (Zolpidem tartrate) .... Take at bedtime as needed 3)  Norvasc 5 Mg Tabs (Amlodipine besylate) .Marland Kitchen.. 1 once daily  for bp 4)  Benicar Hct 40-12.5 Mg Tabs (Olmesartan medoxomil-hctz) .Marland Kitchen.. 1 qd 5)  Lovaza 1 Gm Caps (Omega-3-acid ethyl esters) .Marland Kitchen.. 1 by mouth two times a day 6)  Oscal 500/200 D-3 500-200 Mg-unit Tabs (Calcium-vitamin d) .... Two times a day 7)  Bayer Aspirin Ec Low Dose 81 Mg Tbec (Aspirin) .... Once daily 8)  Alprazolam 0.25 Mg Tbdp (Alprazolam) .Marland Kitchen.. 1 morn, midafternoon and 2 hs for stress and sleep  Other Orders: Flu Vaccine 47yrs + [id number]) Admin of Therapeutic Inj (IM or Union) ([address]) Pneumoccal Vaccine Adm- Medicare [id number]) Admin 1st Vaccine [id number]) Admin of Any Addtl Vaccine [id number]) T-Vitamin D (25-Hydroxy) [id number]) UA Dipstick w/o Micro (automated)  (81003) TLB-BMP (Basic Metabolic Panel-BMET) (99991111) TLB-CBC Platelet - w/Differential (85025-CBCD) TLB-Hepatic/Liver Function Pnl (80076-HEPATIC) TLB-TSH (Thyroid Stimulating Hormone) (84443-TSH)  PAP Screening:    Last PAP smear:  04/04/2006  PAP Smear Results:    Date of Exam:  04/04/2006    Results:  Normal  Next PAP Due:    04/04/2008  Mammogram Screening:    Last Mammogram:  10/29/2002  Osteoporosis Risk Assessment:  Risk Factors for Fracture or Low Bone Density:   Race (White or Asian):     yes   Smoking status:       never   Thyroid disease:     yes  Immunization & Chemoprophylaxis:    Tetanus vaccine: given  (07/29/1998)    Influenza vaccine: Fluvax 3+  (07/20/2008)    Pneumovax: Pneumovax (Medicare)  (07/20/2008)   Patient Instructions: 1)  You need to lose weight. Consider a lower calorie  diet and regular exercise.  2)  It is important that you exercise regularly at least 20 minutes 5 times a week. If you develop chest pain, have severe difficulty breathing, or feel very tired , stop exercising immediately and seek medical attention. 3)  Check your Blood Pressure regularly. If it is above: you should make an appointment.   Prescriptions: LOVAZA 1 GM  CAPS (OMEGA-3-ACID ETHYL ESTERS) 1 by mouth two times a day  #60 x 11   Entered and Authorized by:   Emeterio Reeve MD   Signed by:   Emeterio Reeve MD on 07/20/2008   Method  used:   Electronically to        [facility] (retail)       [place] [address]       [place], [place]  [address]       Ph: [phone]       Fax: [phone]   RxID:   [id number] BENICAR HCT 40-12.5 MG  TABS (OLMESARTAN MEDOXOMIL-HCTZ) 1 qd  #30 x 11   Entered and Authorized by:   Emeterio Reeve MD   Signed by:   Emeterio Reeve MD on 07/20/2008   Method used:   Electronically to        [facility] (retail)       [place] [address] [place], [place]  [address]       Ph: [phone]       Fax: [phone]   RxID:   [id number] ALPRAZOLAM 0.25 MG TBDP (ALPRAZOLAM) 1 morn, midafternoon and 2 hs for stress and sleep  #120 x 5   Entered and Authorized by:   Emeterio Reeve MD   Signed by:   Emeterio Reeve MD on 07/20/2008   Method used:   Print then Give to Patient   RxID:   [id number]  ]  Pneumovax Vaccine    Vaccine Type: Pneumovax (Medicare)    Site: right deltoid    Mfr: Merck    Dose: 0.5 ml    Route: IM    Given by: Levora Angel, RN    Exp. Date: 03/13/2009    Lot #: [id number]   Laboratory Results   Urine Tests   Date/Time Reported: July 20, 2008 1:49 PM   Routine Urinalysis   Color: yellow Appearance: Clear Glucose: negative   (Normal Range: Negative) Bilirubin: negative   (Normal Range:  Negative) Ketone: negative   (Normal Range: Negative) Spec. Gravity: 1.015   (Normal Range: 1.003-1.035) Blood: 2+   (Normal Range: Negative) pH: 5.5   (Normal Range: 5.0-8.0) Protein: negative   (Normal Range: Negative) Urobilinogen: 0.2   (Normal Range: 0-1) Nitrite: negative   (Normal Range: Negative) Leukocyte Esterace: trace   (Normal Range: Negative)    Comments: Joyce Gross  July 20, 2008 1:49 PM

## 2010-11-28 NOTE — Progress Notes (Signed)
Summary: refill on amlodipine 5 mg  Phone Note From Pharmacy   Caller: [facility]* Summary of Call: Amlodipine 5mg    Follow-up for Phone Call        Amlodipine 50 mg okay per dr Joni Fears Follow-up by: Levora Angel, RN,  January 11, 2009 4:35 PM    New/Updated Medications: NORVASC 5 MG  TABS (AMLODIPINE BESYLATE) 1 once daily  for BP   Prescriptions: NORVASC 5 MG  TABS (AMLODIPINE BESYLATE) 1 once daily  for BP  #30 x 6   Entered by:   Levora Angel, RN   Authorized by:   Emeterio Reeve MD   Signed by:   Levora Angel, RN on 01/11/2009   Method used:   Electronically to        [facility] (retail)       [place] [address]       [place], [place]  [address]       Ph: [phone]       Fax: [phone]   RxID:   [id number]

## 2010-11-28 NOTE — Assessment & Plan Note (Signed)
Summary: blood pressure check/mhf reschedule per gina   Vital Signs:  Patient Profile:   70 Years Old Female Temp:     98.1 degrees F Pulse rate:   95 / minute BP sitting:   180 / 90 Cuff size:   large  Vitals Entered By: Levora Angel, RN (January 20, 2008 3:21 PM)                 Chief Complaint:  saw  dr Jorja Loa and dr Zigmund Daniel thinks bp meds need adjusting.  History of Present Illness: Pt saw Dr.Cotter and Dr. Rodena Piety who found BP elevated and told pt to see me to regulste B P BP180/90 X 2,pt feels fine concerned about carotid circulation, to schedule at [facility] no other complaints to check hgb in general feels good, to go on weight reduction diet do not start asa until Dr. Rodena Piety tells her to do so    Current Allergies: ! WELLBUTRIN (BUPROPION HCL) ! SULFAMETHOXAZOLE (SULFAMETHOXAZOLE)     Review of Systems      See HPI   Physical Exam  General:     Well-developed,well-nourished,in no acute distress; alert,appropriate and cooperative throughout examinationoverweight-appearing.   Eyes:     No corneal or conjunctival inflammation noted. EOMI. Perrla. Funduscopic exam benign, without hemorrhages, exudates or papilledema. Vision grossly normal. Ears:     External ear exam shows no significant lesions or deformities.  Otoscopic examination reveals clear canals, tympanic membranes are intact bilaterally without bulging, retraction, inflammation or discharge. Hearing is grossly normal bilaterally. Nose:     External nasal examination shows no deformity or inflammation. Nasal mucosa are pink and moist without lesions or exudates. Mouth:     Oral mucosa and oropharynx without lesions or exudates.  Teeth in good repair. Lungs:     Normal respiratory effort, chest expands symmetrically. Lungs are clear to auscultation, no crackles or wheezes. Heart:     Normal rate and regular rhythm. S1 and S2 normal without gallop, murmur, click, rub or other extra sounds. BP  180/90 Abdomen:     Bowel sounds positive,abdomen soft and non-tender without masses, organomegaly or hernias noted. Extremities:     left pretibial edema and right pretibial edema.   varicose vein rt leg Neurologic:     No cranial nerve deficits noted. Station and gait are normal. Plantar reflexes are down-going bilaterally. DTRs are symmetrical throughout. Sensory, motor and coordinative functions appear intact.    Complete Medication List: 1)  Preservision/lutein Caps (Multiple vitamins-minerals) 2)  Zolpidem Tartrate 10 Mg Tabs (Zolpidem tartrate) .... Take at bedtime as needed 3)  Vitamin D 50000 Unit Caps (Ergocalciferol) .... Take one tablet every week for 8 weeks. call office after completion for recheck vit d level 4)  Norvasc 5 Mg Tabs (Amlodipine besylate) .Marland Kitchen.. 1 once daily  for bp 5)  Benicar Hct 40-12.5 Mg Tabs (Olmesartan medoxomil-hctz) .Marland Kitchen.. 1 qd   Patient Instructions: 1)  to nregulate BP with  amylodipine 5 mg once daily 2)  continue benecar HCT wo-12.5 3)  will notify Dr. Jorja Loa results    Prescriptions: NORVASC 5 MG  TABS (AMLODIPINE BESYLATE) 1 once daily  for BP  #30 x 11   Entered and Authorized by:   Emeterio Reeve MD   Signed by:   Emeterio Reeve MD on 01/20/2008   Method used:   Electronically sent to ...       [facility]  [address]       [place], [place]  [address]       Ph: [phone]       Fax: [phone]   RxID:   [id number]  ]

## 2010-11-28 NOTE — Progress Notes (Signed)
Summary: refill aprpazolam w 2 refills   Phone Note From Pharmacy   Caller: [facility]* Reason for Call: Needs renewal Summary of Call: alprazolam 0.25 mg refills needed  Initial call taken by: Levora Angel, RN,  April 28, 2009 9:50 AM  Follow-up for Phone Call        ok per dr Joni Fears refill pt notifirted and will sch cpx sept 2010 Follow-up by: Levora Angel, RN,  April 28, 2009 9:50 AM      Prescriptions: ALPRAZOLAM 0.25 MG TBDP (ALPRAZOLAM) 1 morn, midafternoon and 2 hs for stress and sleep  #120 x 2   Entered by:   Levora Angel, RN   Authorized by:   Emeterio Reeve MD   Signed by:   Levora Angel, RN on 04/28/2009   Method used:   Telephoned to ...       [facility] (retail)       [place] [address]       [place], [place]  [address]       Ph: [phone]       Fax: [phone]   RxID:   [id number]

## 2010-11-28 NOTE — Procedures (Signed)
Summary: Colonoscopy   Colonoscopy  Procedure date:  03/21/2009  Findings:      Location:  [facility].    Procedures Next Due Date:    Colonoscopy: 03/2019  COLONOSCOPY PROCEDURE REPORT  PATIENT:  Krista Payne, Krista Payne  MR#:  [id number] BIRTHDATE:   1940/11/23, 68 yrs. old   GENDER:   female  ENDOSCOPIST:   Loralee Pacas. Sharlett Iles, MD, [facility] Referred by:   PROCEDURE DATE:  03/21/2009 PROCEDURE:  Colonoscopy, diagnostic ASA CLASS:   Class II INDICATIONS: screening   MEDICATIONS:    Fentanyl 75 mcg IV, Versed 8 mg IV  DESCRIPTION OF PROCEDURE:   After the risks benefits and alternatives of the procedure were thoroughly explained, informed consent was obtained.  Digital rectal exam was performed and revealed no abnormalities.   The LB [id number] endoscope was introduced through the anus and advanced to the cecum, which was identified by both the appendix and ileocecal valve, without limitations.  The quality of the prep was excellent, using MoviPrep.  The instrument was then slowly withdrawn as the colon was fully examined. <<PROCEDUREIMAGES>>    <<OLD IMAGES>>  FINDINGS:  Scattered diverticula were found in the sigmoid colon.  This was otherwise a normal examination of the colon.   Retroflexed views in the rectum revealed no abnormalities.    The scope was then withdrawn from the patient and the procedure completed.  COMPLICATIONS:   None  ENDOSCOPIC IMPRESSION:  1) Diverticula, scattered in the sigmoid colon  2) Otherwise normal examination  3) No polyps or cancers RECOMMENDATIONS:  1) high fiber diet  2) You should continue follow current colorectal cancer screening guidelines for "routine risk" patients with a repeat colonoscopy in 10 years. I do not recommend other colon cancer screening prior to then (including stool tests for microscopic blood) unless new symptoms arise.      REPEAT EXAM:   No   _______________________________ Loralee Pacas. Sharlett Iles,  MD, [facility]  CC: Frankey Shown, MD

## 2010-11-28 NOTE — Progress Notes (Signed)
Summary: change in med-- to benicar  Phone Note Other Incoming   Call placed by: dr Joni Fears Summary of Call: change her med   Follow-up for Phone Call        per dr Joni Fears d/c micardis to benicar Follow-up by: Levora Angel, RN,  December 03, 2007 5:20 PM    New/Updated Medications: BENICAR HCT 40-12.5 MG  TABS (OLMESARTAN MEDOXOMIL-HCTZ) once daily   Prescriptions: BENICAR HCT 40-12.5 MG  TABS (OLMESARTAN MEDOXOMIL-HCTZ) once daily  #30 x 11   Entered by:   Levora Angel, RN   Authorized by:   Emeterio Reeve MD   Signed by:   Levora Angel, RN on 12/03/2007   Method used:   Telephoned to ...       [facility]*       [place] [address]       [place], [place]  [address]       Ph: [phone]       Fax: [phone]   RxID:   [id number]

## 2010-11-28 NOTE — Progress Notes (Signed)
Summary: Simvastatin refill  Phone Note Refill Request Message from:  Fax from Pharmacy on July 14, 2010 10:52 AM  Refills Requested: Medication #1:  SIMVASTATIN 80 MG TABS 1 by mouth once daily   Dosage confirmed as above?Dosage Confirmed Initial call taken by: Gardenia Phlegm RMA,  July 14, 2010 10:52 AM    Prescriptions: SIMVASTATIN 80 MG TABS (SIMVASTATIN) 1 by mouth once daily  #30 x 3   Entered by:   Gardenia Phlegm RMA   Authorized by:   Penni Homans MD   Signed by:   Gardenia Phlegm RMA on 07/14/2010   Method used:   Electronically to        [facility] (retail)       [place] [address] [place], [place]  [address]       Ph: [phone]       Fax: [phone]   RxID:   [id number]

## 2010-11-28 NOTE — Progress Notes (Signed)
Summary: paperwork and bloodwork results   Phone Note Call from Patient Call back at Home Phone [phone]   Caller: pt live Call For: [facility] Summary of Call: she wants to ask Dr Joni Fears about her blood work in December and wants to know if he has filled out the paperwork for her handicapped sticker Initial call taken by: Shanon Payor,  November 11, 2008 1:59 PM  Follow-up for Phone Call        Notified pt informed handicap sticker up front. Per Dr. Joni Fears wants pt to start Simvastin 80 mg at hs spoke with pt never has taken drug. Pt to begin this drug and to recheck labs in 3 months. And call back if problems.  Follow-up by: Levora Angel, RN,  November 11, 2008 3:05 PM    New/Updated Medications: SIMVASTATIN 80 MG TABS (SIMVASTATIN) 1 by mouth once daily   Prescriptions: SIMVASTATIN 80 MG TABS (SIMVASTATIN) 1 by mouth once daily  #30 x 4   Entered by:   Levora Angel, RN   Authorized by:   Emeterio Reeve MD   Signed by:   Levora Angel, RN on 11/11/2008   Method used:   Electronically to        [facility] (retail)       [place] [address]       [place], [place]  [address]       Ph: [phone]       Fax: [phone]   RxID:   [id number]

## 2010-11-28 NOTE — Miscellaneous (Signed)
Summary: [facility] previsit/prep  Clinical Lists Changes  Medications: Added new medication of MOVIPREP 100 GM  SOLR (PEG-KCL-NACL-NASULF-NA ASC-C) As per prep instructions. - Signed Rx of MOVIPREP 100 GM  SOLR (PEG-KCL-NACL-NASULF-NA ASC-C) As per prep instructions.;  #1 x 0;  Signed;  Entered by: Thurston Pounds RN II;  Authorized by: Sable Feil MD [facility];  Method used: Electronically to [facility] [place], [place], [place], [place]  [address], Ph: [phone], Fax: [phone] Observations: Added new observation of ALLERGY REV: Done (03/14/2009 9:43)    Prescriptions: MOVIPREP 100 GM  SOLR (PEG-KCL-NACL-NASULF-NA ASC-C) As per prep instructions.  #1 x 0   Entered by:   Thurston Pounds RN II   Authorized by:   Sable Feil MD [facility]   Signed by:   Thurston Pounds RN II on 03/14/2009   Method used:   Electronically to        [facility] (retail)       [address]       [place], [place]  [address]       Ph: [phone]       Fax: [phone]   RxID:   [id number]

## 2010-11-28 NOTE — Progress Notes (Signed)
Summary: please call   Phone Note Call from Patient Call back at Home Phone [phone]   Caller: Patient Call For: Krista Reeve MD Summary of Call: pt would like dr Joni Fears to call her back personal problem Initial call taken by: Glo Herring,  July 13, 2009 2:27 PM  Follow-up for Phone Call        called by dr Joni Fears needs refills  Follow-up by: Levora Angel, RN,  July 14, 2009 8:17 AM    New/Updated Medications: ALPRAZOLAM 0.25 MG TBDP (ALPRAZOLAM) 1 morn, midafternoon and 2 hs for stress and sleep BENICAR 20 MG TABS (OLMESARTAN MEDOXOMIL) 1 by mouth once daily Prescriptions: ALPRAZOLAM 0.25 MG TBDP (ALPRAZOLAM) 1 morn, midafternoon and 2 hs for stress and sleep  #120 x 5   Entered by:   Levora Angel, RN   Authorized by:   Krista Reeve MD   Signed by:   Levora Angel, RN on 07/14/2009   Method used:   Telephoned to ...       [facility] (retail)       [place] [address]       [place], [place]  [address]       Ph: [phone]       Fax: [phone]   RxID:   [id number] NORVASC 5 MG  TABS (AMLODIPINE BESYLATE) 1 once daily  for BP  #30 x 11   Entered by:   Levora Angel, RN   Authorized by:   Krista Reeve MD   Signed by:   Levora Angel, RN on 07/14/2009   Method used:   Electronically to        [facility] (retail)       [place] [address]       [place], [place]  [address]       Ph: [phone]       Fax: [phone]   RxID:   [id number] BENICAR 20 MG TABS (OLMESARTAN MEDOXOMIL) 1 by mouth once daily  #30 x 11   Entered by:   Levora Angel, RN   Authorized by:   Krista Reeve MD   Signed by:   Levora Angel, RN on 07/14/2009   Method used:   Electronically to        [facility] (retail)       [place] [address]       [place], [place]  [address]       Ph: [phone]       Fax: [phone]   RxID:    [id number] SIMVASTATIN 80 MG TABS (SIMVASTATIN) 1 by mouth once daily  #30 x 11   Entered by:   Levora Angel, RN   Authorized by:   Krista Reeve MD   Signed by:   Levora Angel, RN on 07/14/2009   Method used:   Electronically to        [facility] (retail)       [place] [address]       [place], [place]  [address]       Ph: [phone]       Fax: [phone]   RxID:   [id number]

## 2010-11-28 NOTE — Progress Notes (Signed)
Summary: REFAX BONE DENSITY ORDER WITH SIGNATURE  Phone Note From Other Clinic Call back at FAX [phone]   Caller: [place]  Call For: STAFFORD Summary of Call: tHEY RECEIVED THE ORDER YOU SENT FOR THE BONE DENSITY TEST  HOWEVER THE ORDER WAS NOT SIGNED  NEEDS A NEW ORDER FAXED SHOWING DR STAFFORD'S SIGNATURE  Initial call taken by: Shanon Payor,  October 02, 2007 9:50 AM  Follow-up for Phone Call        REFAXED  Follow-up by: Levora Angel, RN,  October 02, 2007 10:01 AM

## 2010-11-28 NOTE — Assessment & Plan Note (Signed)
Summary: emp/pt fasting/cjr/pt rescd//ccm/pt rsc/cjr   Vital Signs:  Patient profile:   70 year old female Height:      62 inches Weight:      214 pounds BMI:     39.28 O2 Sat:      98 % Temp:     97.8 degrees F Pulse rate:   96 / minute BP sitting:   146 / 80  (left arm) Cuff size:   large  Vitals Entered By: Levora Angel, RN (August 10, 2009 10:24 AM) CC: GO OVER PROBLEMS REFILL MEDS FASTING FOR LABS TODAY     History of Present Illness: Pt in to discuss problems and refill medication hacking cough in association nasal allergy, Had colonoscopic exam, good for 10 years, BP at home usually 120/ 64 or normal concerned about lesion on back, discolored colon exam, Dr. Sharlett Iles uptodate on mammogram aand bone density has lost 9 lbs, to continue to diet, walkin 3times per week  Allergies: 1)  ! Wellbutrin (Bupropion Hcl) 2)  ! Sulfamethoxazole (Sulfamethoxazole)  Past History:  Care Management: Gastroenterology: Dr Verl Blalock Ophthalmology: Dr Liane Comber   Review of Systems      See HPI General:  Denies chills, fatigue, fever, loss of appetite, malaise, sleep disorder, sweats, weakness, and weight loss. ENT:  Denies decreased hearing, difficulty swallowing, ear discharge, earache, hoarseness, nasal congestion, nosebleeds, postnasal drainage, ringing in ears, sinus pressure, and sore throat. CV:  Denies bluish discoloration of lips or nails, chest pain or discomfort, difficulty breathing at night, difficulty breathing while lying down, fainting, fatigue, leg cramps with exertion, lightheadness, near fainting, palpitations, shortness of breath with exertion, swelling of feet, swelling of hands, and weight gain; BP controlled. Resp:  Denies chest discomfort, chest pain with inspiration, cough, coughing up blood, excessive snoring, hypersomnolence, morning headaches, pleuritic, shortness of breath, sputum productive, and wheezing. GI:  Denies abdominal pain, bloody  stools, change in bowel habits, constipation, dark tarry stools, diarrhea, excessive appetite, gas, hemorrhoids, indigestion, loss of appetite, nausea, vomiting, vomiting blood, and yellowish skin color. GU:  Denies abnormal vaginal bleeding, decreased libido, discharge, dysuria, genital sores, hematuria, incontinence, nocturia, urinary frequency, and urinary hesitancy. MS:  Denies joint pain, joint redness, joint swelling, loss of strength, low back pain, mid back pain, muscle aches, muscle , cramps, muscle weakness, stiffness, and thoracic pain. Derm:  Denies changes in color of skin, changes in nail beds, dryness, excessive perspiration, flushing, hair loss, insect bite(s), itching, lesion(s), poor wound healing, and rash. Neuro:  Denies brief paralysis, difficulty with concentration, disturbances in coordination, falling down, headaches, inability to speak, memory loss, numbness, poor balance, seizures, sensation of room spinning, tingling, tremors, visual disturbances, and weakness. Psych:  Denies alternate hallucination ( auditory/visual), anxiety, depression, easily angered, easily tearful, irritability, mental problems, panic attacks, sense of great danger, suicidal thoughts/plans, thoughts of violence, unusual visions or sounds, and thoughts /plans of harming others.  Physical Exam  General:  Well-developed,well-nourished,in no acute distress; alert,appropriate and cooperative throughout examinationoverweight-appearing.   Head:  Normocephalic and atraumatic without obvious abnormalities. No apparent alopecia or balding. Eyes:  No corneal or conjunctival inflammation noted. EOMI. Perrla. Funduscopic exam benign, without hemorrhages, exudates or papilledema. Vision grossly normal. Ears:  External ear exam shows no significant lesions or deformities.  Otoscopic examination reveals clear canals, tympanic membranes are intact bilaterally without bulging, retraction, inflammation or discharge. Hearing  is grossly normal bilaterally. Nose:  External nasal examination shows no deformity or inflammation. Nasal mucosa are pink and  moist without lesions or exudates. Mouth:  Oral mucosa and oropharynx without lesions or exudates.  Teeth in good repair. Neck:  No deformities, masses, or tenderness noted. Chest Wall:  No deformities, masses, or tenderness noted. Breasts:  No mass, nodules, thickening, tenderness, bulging, retraction, inflamation, nipple discharge or skin changes noted.   Lungs:  Normal respiratory effort, chest expands symmetrically. Lungs are clear to auscultation, no crackles or wheezes. Heart:  Normal rate and regular rhythm. S1 and S2 normal without gallop, murmur, click, rub or other extra sounds. Abdomen:  Bowel sounds positive,abdomen soft and non-tender without masses, organomegaly or hernias noted. obese Rectal:  NE Genitalia:  NE Msk:  No deformity or scoliosis noted of thoracic or lumbar spine.   Pulses:  R and L carotid,radial,femoral,dorsalis pedis and posterior tibial pulses are full and equal bilaterally Extremities:  trace edema Neurologic:  No cranial nerve deficits noted. Station and gait are normal. Plantar reflexes are down-going bilaterally. DTRs are symmetrical throughout. Sensory, motor and coordinative functions appear intact. Skin:  .5 cm sebaceous cyst opened and removed Cervical Nodes:  No lymphadenopathy noted Axillary Nodes:  No palpable lymphadenopathy Inguinal Nodes:  No significant adenopathy Psych:  Cognition and judgment appear intact. Alert and cooperative with normal attention span and concentration. No apparent delusions, illusions, hallucinations   Impression & Recommendations:  Problem # 1:  HYPERLIPIDEMIA (ICD-272.4) Assessment Unchanged  The following medications were removed from the medication list:    Lovaza 1 Gm Caps (Omega-3-acid ethyl esters) .Marland Kitchen... 1 by mouth two times a day Her updated medication list for this problem includes:     Simvastatin 80 Mg Tabs (Simvastatin) .Marland Kitchen... 1 by mouth once daily  Orders: TLB-Lipid Panel (80061-LIPID) TLB-Hepatic/Liver Function Pnl (80076-HEPATIC) Prescription Created Electronically [phone])  Problem # 2:  ANXIETY (ICD-300.00) Assessment: Unchanged  Her updated medication list for this problem includes:    Alprazolam 0.25 Mg Tbdp (Alprazolam) .Marland Kitchen... 1 morn, midafternoon and hs  Problem # 3:  DERMATITIS, CHRONIC (ICD-692.9) Assessment: Unchanged  Problem # 4:  EDEMA (ICD-782.3) Assessment: Improved  Her updated medication list for this problem includes:    Benicar Hct 40-12.5 Mg Tabs (Olmesartan medoxomil-hctz) .Marland Kitchen... 1 by mouth once daily  Problem # 5:  ARTHRITIS, HX OF (ICD-V13.4) Assessment: Unchanged  Problem # 6:  HYPERTENSION (ICD-401.9) Assessment: Improved  The following medications were removed from the medication list:    Benicar 20 Mg Tabs (Olmesartan medoxomil) .Marland Kitchen... 1 by mouth once daily Her updated medication list for this problem includes:    Norvasc 5 Mg Tabs (Amlodipine besylate) .Marland Kitchen... 1 once daily  for bp    Benicar Hct 40-12.5 Mg Tabs (Olmesartan medoxomil-hctz) .Marland Kitchen... 1 by mouth once daily  Orders: Venipuncture [id number])  Complete Medication List: 1)  Preservision/lutein Caps (Multiple vitamins-minerals) 2)  Norvasc 5 Mg Tabs (Amlodipine besylate) .Marland Kitchen.. 1 once daily  for bp 3)  Oscal 500/200 D-3 500-200 Mg-unit Tabs (Calcium-vitamin d) .... Two times a day 4)  Bayer Aspirin Ec Low Dose 81 Mg Tbec (Aspirin) .... Once daily 5)  Alprazolam 0.25 Mg Tbdp (Alprazolam) .Marland Kitchen.. 1 morn, midafternoon and hs 6)  Simvastatin 80 Mg Tabs (Simvastatin) .Marland Kitchen.. 1 by mouth once daily 7)  Benicar Hct 40-12.5 Mg Tabs (Olmesartan medoxomil-hctz) .Marland Kitchen.. 1 by mouth once daily  Other Orders: TD Toxoids IM 7 YR + [id number]) Admin 1st Vaccine [id number]) Flu Vaccine 72yrs + [id number]) Administration Flu vaccine - MCR (G0008) UA Dipstick w/o Micro (automated)  (81003) TLB-BMP (Basic  Metabolic Panel-BMET) (99991111) TLB-CBC  Platelet - w/Differential (85025-CBCD) TLB-TSH (Thyroid Stimulating Hormone) (84443-TSH) T-Vitamin D (25-Hydroxy) [phone])  Patient Instructions: 1)  Continue to diet, ha lost 9 lbs 2)  since BP normalat home continue same treatment 3)  removed sebaceous cyst on back 4)  refilled medications Prescriptions: ALPRAZOLAM 0.25 MG TBDP (ALPRAZOLAM) 1 morn, midafternoon and hs  #90 x 5   Entered and Authorized by:   Emeterio Reeve MD   Signed by:   Emeterio Reeve MD on 08/10/2009   Method used:   Print then Give to Patient   RxID:   [id number] BENICAR HCT 40-12.5 MG TABS (OLMESARTAN MEDOXOMIL-HCTZ) 1 by mouth once daily  #30 x 11   Entered and Authorized by:   Emeterio Reeve MD   Signed by:   Emeterio Reeve MD on 08/10/2009   Method used:   Electronically to        [facility] (retail)       [place] [address]       [place], [place]  [address]       Ph: [phone]       Fax: [phone]   RxID:   [id number] SIMVASTATIN 80 MG TABS (SIMVASTATIN) 1 by mouth once daily  #30 x 11   Entered and Authorized by:   Emeterio Reeve MD   Signed by:   Emeterio Reeve MD on 08/10/2009   Method used:   Electronically to        [facility] (retail)       [place] [address]       [place], [place]  [address]       Ph: [phone]       Fax: [phone]   RxID:   [id number] NORVASC 5 MG  TABS (AMLODIPINE BESYLATE) 1 once daily  for BP  #30 x 11   Entered and Authorized by:   Emeterio Reeve MD   Signed by:   Emeterio Reeve MD on 08/10/2009   Method used:   Electronically to        [facility] (retail)       [place] [address]       [place], [place]  [address]       Ph: [phone]       Fax: [phone]   RxID:   [id number]    Immunizations Administered:  Tetanus Vaccine:     Vaccine Type: Td    Site: right deltoid    Mfr: Colma    Dose: 0.5 ml    Route: IM    Given by: Levora Angel, RN    Exp. Date: 02/09/2011    Lot #: [id number]   Preventive Care Screening  Pap Smear:    Next Due:  07/2010  Bone Density:    Date:  11/13/2007    Next Due:  10/2009    Results:  abnormal  Mammogram:    Next Due:  10/2009 Flu Vaccine Consent Questions     Do you have a history of severe allergic reactions to this vaccine? no    Any prior history of allergic reactions to egg and/or gelatin? no    Do you have a sensitivity to the preservative Thimersol? no    Do you have a past history of Guillan-Barre Syndrome?  no    Do you currently have an acute febrile illness? no    Have you ever had a severe reaction to latex? no    Vaccine information given and explained to patient? yes    Are you currently pregnant? no    Lot Number:[id number]   Exp Date:04/27/2010   Site Given  Left Deltoid IM..................gh rn........    Next Due:  10/2009    Laboratory Results   Urine Tests    Routine Urinalysis   Color: yellow Appearance: Clear Glucose: negative   (Normal Range: Negative) Bilirubin: negative   (Normal Range: Negative) Ketone: negative   (Normal Range: Negative) Spec. Gravity: 1.015   (Normal Range: 1.003-1.035) Blood: negative   (Normal Range: Negative) pH: 5.5   (Normal Range: 5.0-8.0) Protein: negative   (Normal Range: Negative) Urobilinogen: 0.2   (Normal Range: 0-1) Nitrite: negative   (Normal Range: Negative) Leukocyte Esterace: trace   (Normal Range: Negative)    Comments: Irish Elders CMA  August 10, 2009 12:56 PM

## 2010-11-30 NOTE — Progress Notes (Signed)
Summary: REFILL REQUEST  Phone Note Refill Request Message from:  Patient on October 04, 2010 10:24 AM  Refills Requested: Medication #1:  BENICAR HCT 40-12.5 MG TABS 1 by mouth once daily   Notes: [facility].    Initial call taken by: Duanne Moron,  October 04, 2010 10:24 AM  Follow-up for Phone Call        Pt called to check on status of refill. This needs to be called in to [facility] phone [phone] Follow-up by: Braulio Bosch,  October 10, 2010 4:06 PM    Prescriptions: BENICAR HCT 40-12.5 MG TABS (OLMESARTAN MEDOXOMIL-HCTZ) 1 by mouth once daily  #90 x 3   Entered by:   Westley Hummer CMA ([facility])   Authorized by:   Emeterio Reeve MD   Signed by:   Westley Hummer CMA ([facility]) on 10/11/2010   Method used:   Faxed to ...       [facility] [address]ation officer)       [facility]       [address] [place]  [address]       Ph: [phone]       Fax: [phone]   [facility]:   [phone]

## 2010-11-30 NOTE — Progress Notes (Signed)
Summary: REQUEST FOR RETURN CALL - NEEDS TEST RESULTS   Phone Note Call from Patient   Caller: Patient Summary of Call: Pt requests that Dr Joni Fears call her in ref to recent tests (labwork)  that she had done.... referral?.... Pt can be reached at [phone].  Initial call taken by: Duanne Moron,  November 16, 2010 8:44 AM  Follow-up for Phone Call        Will call lab Follow-up by: Emeterio Reeve MD,  November 19, 2010 6:47 PM

## 2010-11-30 NOTE — Progress Notes (Signed)
Summary: Pt req refill of Amlodipine and Simvastatin  Phone Note Refill Request Call back at Home Phone [phone]   Refills Requested: Medication #1:  SIMVASTATIN 20 MG TABS once daily   Dosage confirmed as above?Dosage Confirmed  Medication #2:  NORVASC 5 MG  TABS 1 once daily  for BP   Dosage confirmed as above?Dosage Confirmed Pls call this in to [facility]. Pls notify pt when done. Also pt says that she sometimes has a tingly feeling in both hands, especially her lft hand. Pt would like an ov with Dr. Joni Fears or with Dr. Sarajane Jews if possible.    Method Requested: Telephone to Pharmacy Initial call taken by: Braulio Bosch,  October 13, 2010 4:27 PM    Prescriptions: SIMVASTATIN 20 MG TABS (SIMVASTATIN) once daily  #30 x 6   Entered by:   Westley Hummer CMA ([facility])   Authorized by:   Emeterio Reeve MD   Signed by:   Westley Hummer CMA Deborra [facility]ina) on 10/13/2010   Method used:   Electronically to        [facility] (retail)       [place] [address]       [place], [place]  [address]       Ph: [phone]       Fax: [phone]   RxID:   [id number] NORVASC 5 MG  TABS (AMLODIPINE BESYLATE) 1 once daily  for BP  #30 x 6   Entered by:   Westley Hummer CMA ([facility])   Authorized by:   Emeterio Reeve MD   Signed by:   Westley Hummer CMA Deborra Medina) on 10/13/2010   Method used:   Electronically to        [facility] (retail)       [place] [address]       [place], [place]  [address]       Ph: [phone]       Fax: [phone]   RxID:   [id number]

## 2010-11-30 NOTE — Progress Notes (Signed)
Summary: Appt.  Phone Note Call from Patient   Caller: Patient Summary of Call: pt. states Dr. Joni Fears requested to see her this week please call her and let her know when she can come in. Initial call taken by: Glo Herring,  November 22, 2010 2:22 PM  Follow-up for Phone Call        arranged appt time Follow-up by: Emeterio Reeve MD,  November 24, 2010 1:19 PM

## 2010-11-30 NOTE — Assessment & Plan Note (Signed)
Summary: bilateral hand pain/dm   Vital Signs:  Patient profile:   70 year old female O2 Sat:      92 % Temp:     98.7 degrees F Pulse rate:   98 / minute BP sitting:   144 / 80  (left arm) Cuff size:   large  Vitals Entered By: Levora Angel, RN (October 18, 2010 11:37 AM) CC: left hand tingles and drops items stated pain wrist area and 4th 5th fingers. Rt hand tingles when on telephone   History of Present Illness: Here for 3 months of intermittent tingling, numbness, weakness, and mild pain in both hands, the left being worse than the right. She is left handed. No neck or back problems. No feet problems. Her BP is stable.   Allergies: 1)  ! Wellbutrin (Bupropion Hcl) 2)  ! Sulfamethoxazole (Sulfamethoxazole)  Past History:  Past Medical History: Hypertension Anxiety Hyperlipidemia Osteoarthritis  Review of Systems  The patient denies anorexia, fever, weight loss, weight gain, vision loss, decreased hearing, hoarseness, chest pain, syncope, dyspnea on exertion, peripheral edema, prolonged cough, headaches, hemoptysis, abdominal pain, melena, hematochezia, severe indigestion/heartburn, hematuria, incontinence, genital sores, muscle weakness, suspicious skin lesions, transient blindness, difficulty walking, depression, unusual weight change, abnormal bleeding, enlarged lymph nodes, angioedema, breast masses, and testicular masses.         Flu Vaccine Consent Questions     Do you have a history of severe allergic reactions to this vaccine? no    Any prior history of allergic reactions to egg and/or gelatin? no    Do you have a sensitivity to the preservative Thimersol? no    Do you have a past history of Guillan-Barre Syndrome? no    Do you currently have an acute febrile illness? no    Have you ever had a severe reaction to latex? no    Vaccine information given and explained to patient? yes    Are you currently pregnant? no    Lot Number:[id number]   Exp  Date:04/28/2011   Site Given  Left Deltoid IM Levora Angel, RN  October 18, 2010 12:54 PM    Physical Exam  General:  Well-developed,well-nourished,in no acute distress; alert,appropriate and cooperative throughout examination Neck:  No deformities, masses, or tenderness noted. Lungs:  Normal respiratory effort, chest expands symmetrically. Lungs are clear to auscultation, no crackles or wheezes. Heart:  Normal rate and regular rhythm. S1 and S2 normal without gallop, murmur, click, rub or other extra sounds. Pulses:  R and L carotid,radial,femoral,dorsalis pedis and posterior tibial pulses are full and equal bilaterally Extremities:  No clubbing, cyanosis, edema, or deformity noted with normal full range of motion of all joints.   Neurologic:  alert & oriented X3, cranial nerves II-XII intact, strength normal in all extremities, sensation intact to light touch, and gait normal.     Impression & Recommendations:  Problem # 1:  CARPAL TUNNEL SYNDROME (ICD-354.0)  Problem # 2:  PARESTHESIA (ICD-782.0)  Orders: Venipuncture [id number]) TLB-BMP (Basic Metabolic Panel-BMET) (99991111) TLB-CBC Platelet - w/Differential (85025-CBCD) TLB-Hepatic/Liver Function Pnl (80076-HEPATIC) TLB-TSH (Thyroid Stimulating Hormone) (84443-TSH) TLB-B12, Serum-Total ONLY [id number])  Complete Medication List: 1)  Norvasc 5 Mg Tabs (Amlodipine besylate) .Marland Kitchen.. 1 once daily  for bp 2)  Oscal 500/200 D-3 500-200 Mg-unit Tabs (Calcium-vitamin d) .... Two times a day 3)  Bayer Aspirin Ec Low Dose 81 Mg Tbec (Aspirin) .... Once daily 4)  Simvastatin 20 Mg Tabs (Simvastatin) .... Once daily 5)  Benicar Hct 40-12.5 Mg  Tabs (Olmesartan medoxomil-hctz) .Marland Kitchen.. 1 by mouth once daily 6)  Viteyes Areds Advanced Caps (Multiple vitamins-minerals) 7)  Diclofenac Sodium 75 Mg Tbec (Diclofenac sodium) .Marland Kitchen.. 1 bidpc for arthritis and bursitis 8)  Alprazolam 0.5 Mg Tabs (Alprazolam) .Marland Kitchen.. 1 morn, midaternoon and 2 hs  Other  Orders: Flu Vaccine 64yrs + MEDICARE PATIENTS [id number]) Administration Flu vaccine - MCR [id number])  Patient Instructions: 1)  advised her to wear a pair of wrist splints 24 hours a day for several weeks. Check labs    Orders Added: 1)  Est. Patient Level IV [id number] 2)  Venipuncture [36415] 3)  TLB-BMP (Basic Metabolic Panel-BMET) 123456 4)  TLB-CBC Platelet - w/Differential [85025-CBCD] 5)  TLB-Hepatic/Liver Function Pnl [80076-HEPATIC] 6)  TLB-TSH (Thyroid Stimulating Hormone) [84443-TSH] 7)  TLB-B12, Serum-Total ONLY [82607-B12] 8)  Flu Vaccine 37yrs + MEDICARE PATIENTS [Q2039] 9)  Administration Flu vaccine - MCR [id number]

## 2010-11-30 NOTE — Assessment & Plan Note (Signed)
Summary: KIDNEY CONCERNS - PT TO ARRIVE AT 3PM - OK PER DR STAFFORD // RS   Vital Signs:  Patient profile:   70 year old female Weight:      214 pounds BMI:     39.28 Pulse rate:   102 / minute Pulse rhythm:   regular BP sitting:   164 / 70  (left arm)  Vitals Entered By: Kathlene November, CMA (November 23, 2010 3:23 PM) CC: pt c/o ?? kidney inf   History of Present Illness: 134/80 on repeat Cyst 70 year old separated female is in today complaining of urinary frequency but no dysuria and has had considerable increase in frequency of urination with small amounts her urine appeared present nonspecific or rash over the right lower leg for some time She is also being concerned about her renal function tests which were abnormal on the last visit and to be repeated today Blood pressure has been well-controlled  Current Medications (verified): 1)  Norvasc 5 Mg  Tabs (Amlodipine Besylate) .Marland Kitchen.. 1 Once Daily  For Bp 2)  Oscal 500/200 D-3 500-200 Mg-Unit Tabs (Calcium-Vitamin D) .... Two Times A Day 3)  Bayer Aspirin Ec Low Dose 81 Mg Tbec (Aspirin) .... Once Daily 4)  Simvastatin 20 Mg Tabs (Simvastatin) .... Once Daily 5)  Benicar Hct 40-12.5 Mg Tabs (Olmesartan Medoxomil-Hctz) .Marland Kitchen.. 1 By Mouth Once Daily 6)  Viteyes Areds Advanced  Caps (Multiple Vitamins-Minerals) 7)  Diclofenac Sodium 75 Mg Tbec (Diclofenac Sodium) .Marland Kitchen.. 1 Bidpc For Arthritis and Bursitis 8)  Alprazolam 0.5 Mg Tabs (Alprazolam) .Marland Kitchen.. 1 Morn, Midaternoon and 2 Hs  Allergies (verified): 1)  ! Wellbutrin (Bupropion Hcl) 2)  ! Sulfamethoxazole (Sulfamethoxazole)  Past History:  Past Medical History: Last updated: 10/18/2010 Hypertension Anxiety Hyperlipidemia Osteoarthritis  Review of Systems      See HPI General:  See HPI; Denies chills, fatigue, fever, loss of appetite, malaise, sleep disorder, sweats, weakness, and weight loss. Eyes:  Denies blurring, discharge, double vision, eye irritation, eye pain, halos,  itching, light sensitivity, red eye, vision loss-1 eye, and vision loss-both eyes. ENT:  Denies decreased hearing, difficulty swallowing, ear discharge, earache, hoarseness, nasal congestion, nosebleeds, postnasal drainage, ringing in ears, sinus pressure, and sore throat. CV:  Denies bluish discoloration of lips or nails, chest pain or discomfort, difficulty breathing at night, difficulty breathing while lying down, fainting, fatigue, leg cramps with exertion, lightheadness, near fainting, palpitations, shortness of breath with exertion, swelling of feet, swelling of hands, and weight gain. Resp:  Denies chest discomfort, chest pain with inspiration, cough, coughing up blood, excessive snoring, hypersomnolence, morning headaches, pleuritic, shortness of breath, sputum productive, and wheezing. GI:  Denies abdominal pain, bloody stools, change in bowel habits, constipation, dark tarry stools, diarrhea, excessive appetite, gas, hemorrhoids, indigestion, loss of appetite, nausea, vomiting, vomiting blood, and yellowish skin color. GU:  Complains of urinary frequency. Derm:  Complains of rash.  Physical Exam  General:  Well-developed,well-nourished,in no acute distress; alert,appropriate and cooperative throughout examination Lungs:  Normal respiratory effort, chest expands symmetrically. Lungs are clear to auscultation, no crackles or wheezes. Heart:  Normal rate and regular rhythm. S1 and S2 normal without gallop, murmur, click, rub or other extra sounds. Abdomen:  Bowel sounds positive,abdomen soft and non-tender without masses, organomegaly or hernias noted. Msk:  No deformity or scoliosis noted of thoracic or lumbar spine.   Extremities:  No clubbing, cyanosis, edema, or deformity noted with normal full range of motion of all joints.     Impression & Recommendations:  Problem # 1:  RHINITIS (ICD-477.9) Assessment New  Problem # 2:  RENAL INSUFFICIENCY (ICD-588.9) Assessment:  Unchanged  Orders: Venipuncture [id number]) TLB-BMP (Basic Metabolic Panel-BMET) (99991111) Specimen Handling (99000)  Problem # 3:  EXOGENOUS OBESITY (ICD-278.00) Assessment: Unchanged  Problem # 4:  DERMATITIS, CHRONIC (ICD-692.9) Assessment: Unchanged  Her updated medication list for this problem includes:    Triamcinolone Acetonide 0.5 % Oint (Triamcinolone acetonide) .Marland Kitchen... Apply to rash 3 times daily thinly but thoroughly  Problem # 5:  HYPERTENSION (ICD-401.9) Assessment: Improved  Her updated medication list for this problem includes:    Norvasc 5 Mg Tabs (Amlodipine besylate) .Marland Kitchen... 1 once daily  for bp    Benicar Hct 40-12.5 Mg Tabs (Olmesartan medoxomil-hctz) .Marland Kitchen... 1 by mouth once daily  Problem # 6:  UTI (ICD-599.0) Assessment: New  Her updated medication list for this problem includes:    Ciprofloxacin Hcl 500 Mg Tabs (Ciprofloxacin hcl) .Marland Kitchen... 1 two times a day for uti  Complete Medication List: 1)  Norvasc 5 Mg Tabs (Amlodipine besylate) .Marland Kitchen.. 1 once daily  for bp 2)  Oscal 500/200 D-3 500-200 Mg-unit Tabs (Calcium-vitamin d) .... Two times a day 3)  Bayer Aspirin Ec Low Dose 81 Mg Tbec (Aspirin) .... Once daily 4)  Simvastatin 20 Mg Tabs (Simvastatin) .... Once daily 5)  Benicar Hct 40-12.5 Mg Tabs (Olmesartan medoxomil-hctz) .Marland Kitchen.. 1 by mouth once daily 6)  Viteyes Areds Advanced Caps (Multiple vitamins-minerals) 7)  Diclofenac Sodium 75 Mg Tbec (Diclofenac sodium) .Marland Kitchen.. 1 bidpc for arthritis and bursitis 8)  Alprazolam 0.5 Mg Tabs (Alprazolam) .Marland Kitchen.. 1 morn, midaternoon and 2 hs for stress 9)  Triamcinolone Acetonide 0.5 % Oint (Triamcinolone acetonide) .... Apply to rash 3 times daily thinly but thoroughly 10)  Ciprofloxacin Hcl 500 Mg Tabs (Ciprofloxacin hcl) .Marland Kitchen.. 1 two times a day for uti  Other Orders: Hgb [id number])  Patient Instructions: 1)  blood pressure 134/80 in control 2)  2 recheck BMET for renal failure 3)  Triamcinolone cream for nonspecific  rash 4)  Cipro 500 mg b.i.d. for urinary tract infection Prescriptions: CIPROFLOXACIN HCL 500 MG TABS (CIPROFLOXACIN HCL) 1 two times a day for uti  #20 x 0   Entered and Authorized by:   Emeterio Reeve MD   Signed by:   Emeterio Reeve MD on 11/24/2010   Method used:   Electronically to        [facility] (retail)       [facility] [address]       [place], [place]  [address]       Ph: [phone]       Fax: [phone]   RxID:   [id number] CIPROFLOXACIN HCL 500 MG TABS (CIPROFLOXACIN HCL) 1 two times a day for uti  #20 x 0   Entered and Authorized by:   Emeterio Reeve MD   Signed by:   Emeterio Reeve MD on 11/23/2010   Method used:   Print then Give to Patient   RxID:   [id number] ALPRAZOLAM 0.5 MG TABS (ALPRAZOLAM) 1 morn, midaternoon and 2 hs for stress  #360 x 1   Entered and Authorized by:   Emeterio Reeve MD   Signed by:   Emeterio Reeve MD on 11/23/2010   Method used:   Print then Give to Patient   RxID:   [id number] TRIAMCINOLONE ACETONIDE 0.5 % OINT (TRIAMCINOLONE ACETONIDE) apply to  rash 3 times daily thinly but thoroughly  #45 gm x 2   Entered and Authorized by:   Emeterio Reeve MD   Signed by:   Emeterio Reeve MD on 11/23/2010   Method used:   Electronically to        [facility] (retail)       [facility] [address]       [place], [place]  [address]       Ph: [phone]       Fax: [phone]   RxID:   [id number]    Orders Added: 1)  Venipuncture [id number] 2)  TLB-BMP (Basic Metabolic Panel-BMET) [id number] 3)  Hgb [85018] 4)  Specimen Handling [99000] 5)  Est. Patient Level IV [id number]    Laboratory Results   Urine Tests    Routine Urinalysis   Color: yellow Appearance: Clear Glucose: negative   (Normal Range: Negative) Bilirubin: negative   (Normal Range: Negative) Ketone: negative   (Normal Range: Negative) Spec.  Gravity: <1.005   (Normal Range: 1.003-1.035) Blood: negative   (Normal Range: Negative) pH: 5.0   (Normal Range: 5.0-8.0) Protein: negative   (Normal Range: Negative) Urobilinogen: 0.2   (Normal Range: 0-1) Nitrite: negative   (Normal Range: Negative) Leukocyte Esterace: 1+   (Normal Range: Negative)    Comments: Joyce Gross  November 23, 2010 4:13 PM   Blood Tests   Date/Time Received: November 23, 2010 4:13 PM  Date/Time Reported: November 23, 2010 4:13 PM     CBC   HGB:  12.0 g/dL   (Normal Range: 13.0-17.0 in Males, 12.0-15.0 in Females) Comments: Doy Hutching, CMA  November 23, 2010 4:13 PM     Laboratory Results   Urine Tests    Routine Urinalysis   Color: yellow Appearance: Clear Glucose: negative   (Normal Range: Negative) Bilirubin: negative   (Normal Range: Negative) Ketone: negative   (Normal Range: Negative) Spec. Gravity: <1.005   (Normal Range: 1.003-1.035) Blood: negative   (Normal Range: Negative) pH: 5.0   (Normal Range: 5.0-8.0) Protein: negative   (Normal Range: Negative) Urobilinogen: 0.2   (Normal Range: 0-1) Nitrite: negative   (Normal Range: Negative) Leukocyte Esterace: 1+   (Normal Range: Negative)    Comments: Joyce Gross  November 23, 2010 4:13 PM  CBC HGB:  12.0 g/dL   (Normal Range: 13.0-17.0 in Males, 12.0-15.0 in Females) Comments: Doy Hutching, CMA  November 23, 2010 4:13 PM

## 2011-01-08 ENCOUNTER — Encounter: Payer: Self-pay | Admitting: Family Medicine

## 2011-01-18 ENCOUNTER — Encounter: Payer: Self-pay | Admitting: Family Medicine

## 2011-01-18 ENCOUNTER — Ambulatory Visit (INDEPENDENT_AMBULATORY_CARE_PROVIDER_SITE_OTHER): Payer: Medicare Other | Admitting: Family Medicine

## 2011-01-18 VITALS — BP 162/70 | HR 91 | Temp 98.8°F | Wt 216.0 lb

## 2011-01-18 DIAGNOSIS — I10 Essential (primary) hypertension: Secondary | ICD-10-CM

## 2011-01-18 DIAGNOSIS — M19042 Primary osteoarthritis, left hand: Secondary | ICD-10-CM

## 2011-01-18 DIAGNOSIS — F411 Generalized anxiety disorder: Secondary | ICD-10-CM

## 2011-01-18 DIAGNOSIS — N19 Unspecified kidney failure: Secondary | ICD-10-CM

## 2011-01-18 LAB — BASIC METABOLIC PANEL
BUN: 32 mg/dL — ABNORMAL HIGH (ref 6–23)
CO2: 24 mEq/L (ref 19–32)
Calcium: 10 mg/dL (ref 8.4–10.5)
Chloride: 102 mEq/L (ref 96–112)
Creatinine, Ser: 1.4 mg/dL — ABNORMAL HIGH (ref 0.4–1.2)
GFR: 39.17 mL/min — ABNORMAL LOW (ref >60.00)
Glucose, Bld: 115 mg/dL — ABNORMAL HIGH (ref 70–99)
Potassium: 4.6 mEq/L (ref 3.5–5.1)
Sodium: 135 mEq/L (ref 135–145)

## 2011-01-18 MED ORDER — DICLOFENAC SODIUM 75 MG PO TBEC: 75.0000 mg | DELAYED_RELEASE_TABLET | Freq: Two times a day (BID) | ORAL | Status: DC

## 2011-01-21 NOTE — Progress Notes (Signed)
  Subjective:    Patient ID: Krista Payne, female    DOB: 1941-05-04, 70 y.o.   MRN: [id number] This 70 year old white divorced female is in for followup for renal insufficiency which was diagnosed previously. She also complains of pain in the left hand and first MP joint this didn't present for the past 4-5 week Chief complaint is she's had some slight cough recently with clear sputum but very little ulcer related to some nasal congestion and may be related to have allergies of the spurring Weight continues to show the same HPI    Review of Systems C. History of present illness    Objective:   Physical Exam the patient is a white slightly obese female who appears in no distress and is alert and cooperative HEENT negative except for minimal swelling of the nasal mucosa which is boggy and pale Lungs are clear to palpation percussion and auscultation Chest tenderness of the first MP joint of the left, some weakness on gripping witht his Hand      Assessment & Plan:  Renal insufficiency to be evaluated by repeat BMet Anxiety continue alprazolam as prescribed 0 hypertension control continue same medications We'll call results of lab studies

## 2011-01-21 NOTE — Patient Instructions (Signed)
It appears you're doing her well at this timr We'll get in as her lab work to evaluate renal insufficiency and at that time we'll talk about treatment of your arthritic problems Occasional cough is most likely due to allergy chest examination was negative Hypertension is well controlled 162/70 on arrival which is usually elevated but later was 140/70 Since you continue to have anxiety episodes continue your alprazolam Continue her simvastatin for your hyperlipidemia

## 2011-02-12 ENCOUNTER — Other Ambulatory Visit: Payer: Self-pay | Admitting: Family Medicine

## 2011-02-12 DIAGNOSIS — Z1231 Encounter for screening mammogram for malignant neoplasm of breast: Secondary | ICD-10-CM

## 2011-02-20 ENCOUNTER — Other Ambulatory Visit (HOSPITAL_COMMUNITY)
Admission: RE | Admit: 2011-02-20 | Discharge: 2011-02-20 | Disposition: A | Discharge status: Home / Self Care | Payer: Medicare Other | Source: Ambulatory Visit | Attending: Family Medicine | Admitting: Family Medicine

## 2011-02-20 ENCOUNTER — Ambulatory Visit (INDEPENDENT_AMBULATORY_CARE_PROVIDER_SITE_OTHER): Payer: Medicare Other | Admitting: Family Medicine

## 2011-02-20 ENCOUNTER — Encounter: Payer: Self-pay | Admitting: Family Medicine

## 2011-02-20 VITALS — BP 140/80 | HR 81 | Temp 97.9°F | Ht 62.0 in | Wt 212.0 lb

## 2011-02-20 DIAGNOSIS — Z01419 Encounter for gynecological examination (general) (routine) without abnormal findings: Secondary | ICD-10-CM | POA: Insufficient documentation

## 2011-02-20 DIAGNOSIS — E785 Hyperlipidemia, unspecified: Secondary | ICD-10-CM

## 2011-02-20 DIAGNOSIS — I10 Essential (primary) hypertension: Secondary | ICD-10-CM

## 2011-02-20 DIAGNOSIS — Z136 Encounter for screening for cardiovascular disorders: Secondary | ICD-10-CM

## 2011-02-20 DIAGNOSIS — E559 Vitamin D deficiency, unspecified: Secondary | ICD-10-CM

## 2011-02-20 DIAGNOSIS — R350 Frequency of micturition: Secondary | ICD-10-CM

## 2011-02-20 DIAGNOSIS — Z0000 Encounter for general adult medical examination without abnormal findings: Secondary | ICD-10-CM

## 2011-02-20 DIAGNOSIS — D649 Anemia, unspecified: Secondary | ICD-10-CM

## 2011-02-20 DIAGNOSIS — E039 Hypothyroidism, unspecified: Secondary | ICD-10-CM

## 2011-02-20 LAB — POCT URINALYSIS DIPSTICK
Bilirubin, UA: NEGATIVE
Glucose, UA: NEGATIVE
Ketones, UA: NEGATIVE
Leukocytes, UA: NEGATIVE
Nitrite, UA: NEGATIVE
Protein, UA: NEGATIVE
Spec Grav, UA: 0.2
Urobilinogen, UA: 0.2
pH, UA: 5

## 2011-02-20 LAB — BASIC METABOLIC PANEL
BUN: 29 mg/dL — ABNORMAL HIGH (ref 6–23)
CO2: 25 mEq/L (ref 19–32)
Calcium: 10.2 mg/dL (ref 8.4–10.5)
Chloride: 103 mEq/L (ref 96–112)
Creatinine, Ser: 1.4 mg/dL — ABNORMAL HIGH (ref 0.4–1.2)
GFR: 41.18 mL/min — ABNORMAL LOW (ref >60.00)
Glucose, Bld: 104 mg/dL — ABNORMAL HIGH (ref 70–99)
Potassium: 4.6 mEq/L (ref 3.5–5.1)
Sodium: 138 mEq/L (ref 135–145)

## 2011-02-20 LAB — CBC WITH DIFFERENTIAL/PLATELET
Basophils Absolute: 0.1 10*3/uL (ref 0.0–0.1)
Basophils Relative: 1.4 % (ref 0.0–3.0)
Eosinophils Absolute: 0.2 10*3/uL (ref 0.0–0.7)
Eosinophils Relative: 3.7 % (ref 0.0–5.0)
HCT: 34.3 % — ABNORMAL LOW (ref 36.0–46.0)
Hemoglobin: 11.9 g/dL — ABNORMAL LOW (ref 12.0–15.0)
Lymphocytes Relative: 15.6 % (ref 12.0–46.0)
Lymphs Abs: 0.9 10*3/uL (ref 0.7–4.0)
MCHC: 34.8 g/dL (ref 30.0–36.0)
MCV: 89.9 fl (ref 78.0–100.0)
Monocytes Absolute: 0.5 10*3/uL (ref 0.1–1.0)
Monocytes Relative: 8.2 % (ref 3.0–12.0)
Neutro Abs: 4.3 10*3/uL (ref 1.4–7.7)
Neutrophils Relative %: 71.1 % (ref 43.0–77.0)
Platelets: 372 10*3/uL (ref 150.0–400.0)
RBC: 3.82 Mil/uL — ABNORMAL LOW (ref 3.87–5.11)
RDW: 13.5 % (ref 11.5–14.6)
WBC: 6.1 10*3/uL (ref 4.5–10.5)

## 2011-02-20 LAB — LIPID PANEL
Cholesterol: 180 mg/dL (ref 0–200)
HDL: 58.3 mg/dL (ref >39.00)
LDL Cholesterol: 94 mg/dL (ref 0–99)
Total CHOL/HDL Ratio: 3
Triglycerides: 139 mg/dL (ref 0.0–149.0)
VLDL: 27.8 mg/dL (ref 0.0–40.0)

## 2011-02-20 LAB — HEPATIC FUNCTION PANEL
ALT: 11 U/L (ref 0–35)
AST: 16 U/L (ref 0–37)
Albumin: 4.3 g/dL (ref 3.5–5.2)
Alkaline Phosphatase: 60 U/L (ref 39–117)
Bilirubin, Direct: 0.1 mg/dL (ref 0.0–0.3)
Total Bilirubin: 0.8 mg/dL (ref 0.3–1.2)
Total Protein: 7.5 g/dL (ref 6.0–8.3)

## 2011-02-20 LAB — TSH: TSH: 1.07 u[IU]/mL (ref 0.35–5.50)

## 2011-02-20 MED ORDER — SIMVASTATIN 20 MG PO TABS: 20.0000 mg | ORAL_TABLET | Freq: Every day | ORAL | Status: DC

## 2011-02-20 MED ORDER — OLMESARTAN MEDOXOMIL-HCTZ 40-12.5 MG PO TABS: 1.0000 | ORAL_TABLET | Freq: Every day | ORAL | Status: DC

## 2011-02-20 MED ORDER — AMLODIPINE BESYLATE 5 MG PO TABS: 5.0000 mg | ORAL_TABLET | Freq: Every day | ORAL | Status: DC

## 2011-02-21 LAB — VITAMIN D 25 HYDROXY (VIT D DEFICIENCY, FRACTURES): Vit D, 25-Hydroxy: 41 ng/mL (ref 30–89)

## 2011-02-27 NOTE — Progress Notes (Signed)
Quick Note:  Called and spoke with pt regarding lab results; pt would like a copy sent to her home address ______

## 2011-03-02 ENCOUNTER — Telehealth: Payer: Self-pay

## 2011-03-02 NOTE — Telephone Encounter (Signed)
Spoke with pt about lab results and pt would like to know when she needs to be seen again

## 2011-03-02 NOTE — Progress Notes (Signed)
Quick Note:  Called and spoke with pt about lab results. ______

## 2011-03-07 ENCOUNTER — Ambulatory Visit
Admission: RE | Admit: 2011-03-07 | Discharge: 2011-03-07 | Disposition: A | Discharge status: Home / Self Care | Payer: Medicare Other | Source: Ambulatory Visit | Attending: Family Medicine | Admitting: Family Medicine

## 2011-03-07 DIAGNOSIS — Z1231 Encounter for screening mammogram for malignant neoplasm of breast: Secondary | ICD-10-CM

## 2011-03-08 NOTE — Telephone Encounter (Signed)
Repeat lab tests and ov in 3 months

## 2011-03-09 NOTE — Telephone Encounter (Signed)
Pt is aware.  

## 2011-03-17 ENCOUNTER — Encounter: Payer: Self-pay | Admitting: Family Medicine

## 2011-03-17 NOTE — Progress Notes (Signed)
  Subjective:    Patient ID: Krista Payne, female    DOB: Jan 17, 1941, 70 y.o.   MRN: [id number] This 70 year old white single femalehe is in today to discuss her medical problems and be reexamined regarding renal failure on previous BE MET examinedHPI She relates she has lost 4 pounds and feels very good and essentially has no new complaints her blood pressures control she continues to be concerned about varicose veins on her legs which do cause some pain at times Anxiety and stresses are controlled with alprazolam  Review of Systems  Constitutional: Negative.   HENT: Negative.   Eyes: Negative.   Respiratory: Negative.   Cardiovascular: Negative.   Gastrointestinal: Negative.   Genitourinary: Negative.   Musculoskeletal: Negative.   Skin: Negative.   Neurological: Negative.   Hematological: Negative.   Psychiatric/Behavioral: The patient is nervous/anxious.        Objective:   Physical Exam The patient is a well-developed well-nourished obese white female who is very cooperative and pleasant HEENT negative carotid pulses good thyroid normal Breasts examination revealed full breasts no masses no tenderness nipples normal axilla clear Lungs clear to palpation percussion auscultation Heart examination revealed normal sized heart no murmurs regular rhythm Abdomen obese however liver spleen kidneys nonpalpable no masses felt bowel sounds normal Pelvic examination not done Extremities lower extremities there are several varicose veins which are obvious but none are tender at this time Neurological exam negativeelectrocardiogram is normal       Assessment & Plan:  Hypertension continue same medication since well controlled Chronic anxiety controlled with alprazolam oh 0.5 mg a day Exogenous obesity recommend continuation of weight reduction diet Hyperlipidemia continue Zocor Renal failure to continue to get lab studies and possibly refer to nephrologist

## 2011-03-17 NOTE — Patient Instructions (Signed)
In general you're doing her well and I recommend he continue to diet in order to lose weight Continue your medications for hypertension and hyperlipidemia Continue alprazolam for your anxiety After results pertaining to renal failure I will call you and we will possibly refer you to a nephrologist if abnormal Will refill your medications

## 2011-04-30 NOTE — Progress Notes (Signed)
Lab studies are good no canges

## 2011-04-30 NOTE — Progress Notes (Signed)
Liver function good

## 2011-05-08 ENCOUNTER — Other Ambulatory Visit: Payer: Self-pay | Admitting: Family Medicine

## 2011-05-18 ENCOUNTER — Ambulatory Visit (HOSPITAL_COMMUNITY)
Admission: RE | Admit: 2011-05-18 | Discharge: 2011-05-18 | Disposition: A | Discharge status: Home / Self Care | Payer: Medicare Other | Source: Ambulatory Visit | Attending: Family Medicine | Admitting: Family Medicine

## 2011-05-18 ENCOUNTER — Other Ambulatory Visit: Payer: Self-pay | Admitting: Family Medicine

## 2011-05-18 DIAGNOSIS — Z780 Asymptomatic menopausal state: Secondary | ICD-10-CM | POA: Insufficient documentation

## 2011-05-18 DIAGNOSIS — M1990 Unspecified osteoarthritis, unspecified site: Secondary | ICD-10-CM

## 2011-05-18 DIAGNOSIS — M899 Disorder of bone, unspecified: Secondary | ICD-10-CM | POA: Insufficient documentation

## 2011-05-18 DIAGNOSIS — M949 Disorder of cartilage, unspecified: Secondary | ICD-10-CM | POA: Insufficient documentation

## 2011-05-18 DIAGNOSIS — Z8739 Personal history of other diseases of the musculoskeletal system and connective tissue: Secondary | ICD-10-CM

## 2011-06-11 NOTE — Progress Notes (Signed)
Pt aware.

## 2011-06-12 ENCOUNTER — Ambulatory Visit (INDEPENDENT_AMBULATORY_CARE_PROVIDER_SITE_OTHER): Payer: Medicare Other | Admitting: Family Medicine

## 2011-06-12 ENCOUNTER — Encounter: Payer: Self-pay | Admitting: Family Medicine

## 2011-06-12 DIAGNOSIS — R5381 Other malaise: Secondary | ICD-10-CM

## 2011-06-12 DIAGNOSIS — M1990 Unspecified osteoarthritis, unspecified site: Secondary | ICD-10-CM

## 2011-06-12 DIAGNOSIS — N19 Unspecified kidney failure: Secondary | ICD-10-CM

## 2011-06-12 DIAGNOSIS — E6609 Other obesity due to excess calories: Secondary | ICD-10-CM

## 2011-06-12 DIAGNOSIS — R5383 Other fatigue: Secondary | ICD-10-CM

## 2011-06-12 DIAGNOSIS — F329 Major depressive disorder, single episode, unspecified: Secondary | ICD-10-CM

## 2011-06-12 DIAGNOSIS — F32A Depression, unspecified: Secondary | ICD-10-CM

## 2011-06-12 DIAGNOSIS — N390 Urinary tract infection, site not specified: Secondary | ICD-10-CM

## 2011-06-12 DIAGNOSIS — E669 Obesity, unspecified: Secondary | ICD-10-CM

## 2011-06-12 DIAGNOSIS — M129 Arthropathy, unspecified: Secondary | ICD-10-CM

## 2011-06-12 LAB — POCT URINALYSIS DIPSTICK
Bilirubin, UA: NEGATIVE
Blood, UA: NEGATIVE
Glucose, UA: NEGATIVE
Ketones, UA: NEGATIVE
Nitrite, UA: NEGATIVE
Protein, UA: NEGATIVE
Spec Grav, UA: 1.015
Urobilinogen, UA: 0.2
pH, UA: 5.5

## 2011-06-12 LAB — BASIC METABOLIC PANEL
BUN: 43 mg/dL — ABNORMAL HIGH (ref 6–23)
CO2: 23 mEq/L (ref 19–32)
Calcium: 10.5 mg/dL (ref 8.4–10.5)
Chloride: 104 mEq/L (ref 96–112)
Creatinine, Ser: 1.4 mg/dL — ABNORMAL HIGH (ref 0.4–1.2)
GFR: 40.45 mL/min — ABNORMAL LOW (ref >60.00)
Glucose, Bld: 123 mg/dL — ABNORMAL HIGH (ref 70–99)
Potassium: 4.3 mEq/L (ref 3.5–5.1)
Sodium: 137 mEq/L (ref 135–145)

## 2011-06-12 LAB — POCT HEMOGLOBIN: Hemoglobin: 12.2

## 2011-06-12 MED ORDER — METHYLPREDNISOLONE ACETATE 80 MG/ML IJ SUSP
120.0000 mg | Freq: Once | INTRAMUSCULAR | Status: AC
  Administered 2011-06-12: 120 mg via INTRAMUSCULAR

## 2011-06-12 MED ORDER — TRIAMCINOLONE ACETONIDE 0.5 % EX OINT: TOPICAL_OINTMENT | Freq: Two times a day (BID) | CUTANEOUS | Status: DC

## 2011-06-12 NOTE — Progress Notes (Signed)
Addended by: Colleen Can on: 06/12/2011 03:12 PM   Modules accepted: Orders

## 2011-06-12 NOTE — Patient Instructions (Addendum)
Arthritis fingers actng up so will give you Depomedrol  injection and she can take Nsaids be cause of  renal problem Start taking one half of the alprazolam in the morning midafternoon and a whole tablet at bedtime this will help the stress and depression I am going to check a hemoglobin complete blood count and Bmet which we'll check the kidney function Bone density rally OK continue calcium with vit D twice daily You havegained from 212 to 218 since last visit

## 2011-06-12 NOTE — Progress Notes (Signed)
  Subjective:    Patient ID: Krista Payne, female    DOB: [birth year], 70 y.o.   MRN: [id number] This 70 year old white divorced female in today for followup of your failure. She also relates she is tired fatigued and somewhat depressed since she is having considerable problem one of her children he has been taking one alprazolam per day other complaint is that for years over left anteverted painful and tender on movement he is left-handed but tries to do things with the right her at she doesn't have any urinary tract symptoms but had a UTI last visit so we'll check a urine specimen tires to discuss bone density which were relatively good and she needs to continue on calcium with vitamin D twice daily H. and relates she is very lonely and desires companionship. Has gained since lat visit. No other positive symptoms on systems review Spent proximally and 30-40 minutes talking to the patient discussing her problems with her family and depression as well as her medical problems HPI    Review of Systems see history of present illness     Objective:   Physical Exam patient is a well-built well-nourished obese white female who appears depressed but in no distress Lungs clear to palpation percussion auscultation no rales no wheezes Heart no cardiomegaly heart sounds are good without murmurs regular rhythm Strandiness marked tenderness of the PIP joints on left hand especially index and middle trigger i No peripheral edema stasis dermatitis still persists on the right lower leg        Assessment & Plan:  Renal failure to repeat BMET today Arthritis to give Depo-Medrol 120 mg IM since patient does not want to take prednisone orally and cannot take NSAIDs Although UTI to get urinalysis Fatigue check CBC Depression take alprazolam oh 0.25 morning mid afternoon and 0.5 mg at bedtime Osteopenia continue calcium with vitamin D twice a day

## 2011-06-14 ENCOUNTER — Other Ambulatory Visit: Payer: Self-pay | Admitting: Family Medicine

## 2011-06-14 DIAGNOSIS — N259 Disorder resulting from impaired renal tubular function, unspecified: Secondary | ICD-10-CM

## 2011-06-14 DIAGNOSIS — R799 Abnormal finding of blood chemistry, unspecified: Secondary | ICD-10-CM

## 2011-09-11 ENCOUNTER — Other Ambulatory Visit (INDEPENDENT_AMBULATORY_CARE_PROVIDER_SITE_OTHER): Payer: Medicare Other

## 2011-09-11 DIAGNOSIS — R799 Abnormal finding of blood chemistry, unspecified: Secondary | ICD-10-CM

## 2011-09-11 DIAGNOSIS — R7989 Other specified abnormal findings of blood chemistry: Secondary | ICD-10-CM

## 2011-09-11 DIAGNOSIS — N259 Disorder resulting from impaired renal tubular function, unspecified: Secondary | ICD-10-CM

## 2011-09-11 LAB — BASIC METABOLIC PANEL
BUN: 31 mg/dL — ABNORMAL HIGH (ref 6–23)
CO2: 26 mEq/L (ref 19–32)
Calcium: 9.7 mg/dL (ref 8.4–10.5)
Chloride: 103 mEq/L (ref 96–112)
Creatinine, Ser: 1.1 mg/dL (ref 0.4–1.2)
GFR: 51.53 mL/min — ABNORMAL LOW (ref >60.00)
Glucose, Bld: 100 mg/dL — ABNORMAL HIGH (ref 70–99)
Potassium: 4.2 mEq/L (ref 3.5–5.1)
Sodium: 139 mEq/L (ref 135–145)

## 2011-09-12 ENCOUNTER — Telehealth: Payer: Self-pay | Admitting: Family Medicine

## 2011-09-12 NOTE — Telephone Encounter (Signed)
Please call her on her home number when labs are back. Thanks.

## 2011-09-17 NOTE — Telephone Encounter (Signed)
This patient needs a 30 minute appointment to establish since she stained seeing physicians

## 2011-09-18 NOTE — Telephone Encounter (Addendum)
Pt has ov sch for 11-13-2011 2pm. Pt would like bloodwork results also copy mail to her.

## 2011-09-19 NOTE — Telephone Encounter (Signed)
Copy mailed.

## 2011-11-13 ENCOUNTER — Ambulatory Visit (INDEPENDENT_AMBULATORY_CARE_PROVIDER_SITE_OTHER): Payer: Medicare Other | Admitting: Family Medicine

## 2011-11-13 ENCOUNTER — Encounter: Payer: Self-pay | Admitting: Family Medicine

## 2011-11-13 VITALS — BP 200/100 | Temp 98.2°F | Wt 226.0 lb

## 2011-11-13 DIAGNOSIS — N259 Disorder resulting from impaired renal tubular function, unspecified: Secondary | ICD-10-CM | POA: Diagnosis not present

## 2011-11-13 DIAGNOSIS — R609 Edema, unspecified: Secondary | ICD-10-CM

## 2011-11-13 DIAGNOSIS — Z23 Encounter for immunization: Secondary | ICD-10-CM | POA: Diagnosis not present

## 2011-11-13 DIAGNOSIS — I10 Essential (primary) hypertension: Secondary | ICD-10-CM | POA: Diagnosis not present

## 2011-11-13 LAB — POCT URINALYSIS DIPSTICK
Bilirubin, UA: NEGATIVE
Blood, UA: NEGATIVE
Glucose, UA: NEGATIVE
Ketones, UA: NEGATIVE
Nitrite, UA: NEGATIVE
Protein, UA: NEGATIVE
Spec Grav, UA: 1.015
Urobilinogen, UA: 0.2
pH, UA: 7

## 2011-11-13 LAB — CBC WITH DIFFERENTIAL/PLATELET
Basophils Absolute: 0.1 10*3/uL (ref 0.0–0.1)
Basophils Relative: 1 % (ref 0.0–3.0)
Eosinophils Absolute: 0.4 10*3/uL (ref 0.0–0.7)
Eosinophils Relative: 5 % (ref 0.0–5.0)
HCT: 36.3 % (ref 36.0–46.0)
Hemoglobin: 12.4 g/dL (ref 12.0–15.0)
Lymphocytes Relative: 14.2 % (ref 12.0–46.0)
Lymphs Abs: 1.2 10*3/uL (ref 0.7–4.0)
MCHC: 34 g/dL (ref 30.0–36.0)
MCV: 91 fl (ref 78.0–100.0)
Monocytes Absolute: 0.7 10*3/uL (ref 0.1–1.0)
Monocytes Relative: 8.3 % (ref 3.0–12.0)
Neutro Abs: 6.2 10*3/uL (ref 1.4–7.7)
Neutrophils Relative %: 71.5 % (ref 43.0–77.0)
Platelets: 362 10*3/uL (ref 150.0–400.0)
RBC: 3.99 Mil/uL (ref 3.87–5.11)
RDW: 13 % (ref 11.5–14.6)
WBC: 8.6 10*3/uL (ref 4.5–10.5)

## 2011-11-13 LAB — BASIC METABOLIC PANEL
BUN: 27 mg/dL — ABNORMAL HIGH (ref 6–23)
CO2: 25 mEq/L (ref 19–32)
Calcium: 10.2 mg/dL (ref 8.4–10.5)
Chloride: 101 mEq/L (ref 96–112)
Creatinine, Ser: 1.4 mg/dL — ABNORMAL HIGH (ref 0.4–1.2)
GFR: 41.09 mL/min — ABNORMAL LOW (ref >60.00)
Glucose, Bld: 111 mg/dL — ABNORMAL HIGH (ref 70–99)
Potassium: 4.4 mEq/L (ref 3.5–5.1)
Sodium: 137 mEq/L (ref 135–145)

## 2011-11-13 MED ORDER — LOSARTAN POTASSIUM 50 MG PO TABS: 50.0000 mg | ORAL_TABLET | Freq: Every day | ORAL | Status: DC

## 2011-11-13 MED ORDER — AMLODIPINE BESYLATE 10 MG PO TABS: 10.0000 mg | ORAL_TABLET | Freq: Every day | ORAL | Status: DC

## 2011-11-13 MED ORDER — FUROSEMIDE 20 MG PO TABS: 20.0000 mg | ORAL_TABLET | Freq: Every day | ORAL | Status: DC

## 2011-11-13 NOTE — Progress Notes (Signed)
  Subjective:    Patient ID: Krista Payne, female    DOB: [birth year], 71 y.o.   MRN: [id number]  HPI Krista Payne is a 71 year old, single female, who comes in today as a new patient to see me having been followed by Dr. Joni Fears.  Her last physical examination she states was April 2012.  She takes 5 mg of Norvasc daily, along with Benicar 40 -- 12.5 daily for hypertension, and she states her blood pressure at home is 155/85.  However, blood pressure here today is 200/90.  Pulse is 70 and regular.  In reviewing her lab work.  She has a history of renal insufficiency.  GFR less than 50.  She has never seen a nephrologist.   Review of Systems    General and cardiovascular and renal review of systems otherwise negative Objective:   Physical Exam Obese female weight 226 pounds, BP right arm sitting position, 200/100, pulse 70 and regular.  Cardiovascular exam negative.  Periphery 2+ pedal edema       Assessment & Plan:  Hypertension not at goal with setting of underlying renal insufficiency.............. Plan complete a salt free diet, walk 20 minutes daily, increase Norvasc to 10 mg daily, switched, Benicar, two, Hyzaar, BP check 3 times daily.  Follow-up on Thursday

## 2011-11-13 NOTE — Patient Instructions (Signed)
Stay at complete bed rest at home today, tomorrow, and Thursday.  Check your blood pressure 3 times daily.  Complete salt free diet.  Norvasc 10 mg daily, Cozaar, 50 mg daily, and Lasix 20 mg daily in the morning.  Return Thursday afternoon for follow-up with the data and the blood pressure device

## 2011-11-15 ENCOUNTER — Encounter: Payer: Self-pay | Admitting: Family Medicine

## 2011-11-15 ENCOUNTER — Ambulatory Visit (INDEPENDENT_AMBULATORY_CARE_PROVIDER_SITE_OTHER): Payer: Medicare Other | Admitting: Family Medicine

## 2011-11-15 DIAGNOSIS — N259 Disorder resulting from impaired renal tubular function, unspecified: Secondary | ICD-10-CM

## 2011-11-15 DIAGNOSIS — I10 Essential (primary) hypertension: Secondary | ICD-10-CM | POA: Diagnosis not present

## 2011-11-15 DIAGNOSIS — R609 Edema, unspecified: Secondary | ICD-10-CM | POA: Diagnosis not present

## 2011-11-15 MED ORDER — AMITRIPTYLINE HCL 25 MG PO TABS: 25.0000 mg | ORAL_TABLET | Freq: Every day | ORAL | Status: DC

## 2011-11-15 NOTE — Progress Notes (Signed)
  Subjective:    Patient ID: Krista Payne, female    DOB: 1941-06-27, 71 y.o.   MRN: [id number]  HPI [name] is a 71 year old female, who comes in today for evaluation of 3 problems.  We saw her as a new patient two days ago with a blood pressure of 200/90.  We placed her at bed rest and started Lasix 20 mg daily, Cozaar, 50 mg daily, and increased her Norvasc from 5 mg to 10.  She diuresed to 8 pounds in the last two days.  She is not on a salt free diet yet.  Blood pressures dropped to 148/70.  She also is complaining of a cough from allergic rhinitis advised OTC Claritin plain.  She has renal insufficiency.  Check renal function today because of new medication.  She has chronic anxiety and wants something advised her I would only give her small doses of Elavil.  No tranquilizers   Review of Systems    General and cardiovascular review of systems otherwise negative Objective:   Physical Exam Well-developed well-nourished, female, in no acute distress.  Examination of the large lungs were normal.  Blood pressure 140/70 right arm sitting position only trace edema.  Previously 2+       Assessment & Plan:  Hypertension improved.  Plan continue current medications salt free diet walk 20 minutes daily.  BP check daily follow-up next Tuesday.  Renal insufficiency.  Check electrolytes.  Cough from allergic rhinitis OTC Claritin.  Sleep dysfunction anxiety Elavil 25 mg nightly no tranquilizers

## 2011-11-15 NOTE — Patient Instructions (Signed)
Continue your current blood pressure medications daily.  Complete a salt free diet.  Walk 20 minutes daily.  Blood pressure checked daily in the morning.  Return next Tuesday for follow-up.  Elavil 25 mg at bedtime for sleep dysfunction and anxiety.  Claritin over-the-counter plain one daily for allergic rhinitis and cough

## 2011-11-16 LAB — BASIC METABOLIC PANEL
BUN: 32 mg/dL — ABNORMAL HIGH (ref 6–23)
CO2: 23 mEq/L (ref 19–32)
Calcium: 9.6 mg/dL (ref 8.4–10.5)
Chloride: 98 mEq/L (ref 96–112)
Creatinine, Ser: 1.6 mg/dL — ABNORMAL HIGH (ref 0.4–1.2)
GFR: 34.78 mL/min — ABNORMAL LOW (ref >60.00)
Glucose, Bld: 134 mg/dL — ABNORMAL HIGH (ref 70–99)
Potassium: 3.9 mEq/L (ref 3.5–5.1)
Sodium: 136 mEq/L (ref 135–145)

## 2011-11-20 ENCOUNTER — Encounter: Payer: Self-pay | Admitting: Family Medicine

## 2011-11-20 ENCOUNTER — Ambulatory Visit (INDEPENDENT_AMBULATORY_CARE_PROVIDER_SITE_OTHER): Payer: Medicare Other | Admitting: Family Medicine

## 2011-11-20 DIAGNOSIS — I10 Essential (primary) hypertension: Secondary | ICD-10-CM | POA: Diagnosis not present

## 2011-11-20 DIAGNOSIS — J069 Acute upper respiratory infection, unspecified: Secondary | ICD-10-CM | POA: Insufficient documentation

## 2011-11-20 DIAGNOSIS — N259 Disorder resulting from impaired renal tubular function, unspecified: Secondary | ICD-10-CM

## 2011-11-20 MED ORDER — HYDROCODONE-HOMATROPINE 5-1.5 MG/5ML PO SYRP: ORAL_SOLUTION | ORAL | Status: DC

## 2011-11-20 NOTE — Patient Instructions (Signed)
Stop the Lasix.  Check your blood pressure daily in the morning.  Return in two weeks for follow-up.  Tylenol lots of liquids and Hydromet one half to 1 teaspoon at bedtime as needed for cough and cold

## 2011-11-20 NOTE — Progress Notes (Signed)
  Subjective:    Patient ID: Krista Payne, female    DOB: [birth year], 71 y.o.   MRN: [id number]  HPI [name] is a 71 year old married  female, who comes in today for evaluation of 3 problems.  We have been working with her and adjusting her medications over the last couple weeks to get her blood pressure back to normal now.  Her BP is 130/68, pulse 70 and regular.  She has a history of renal insufficiency, and we been monitoring her renal function.  When we had the Lasix.  Her creatinine went up to 1.6.  GFR dropped from 41 to 34.  She also has a viral upper respiratory infection.  She has sore throat, head congestion, and cough.  No fever, no sputum production.   Review of Systems    General cardiovascular ENT, pulmonary, venous systems otherwise negative Objective:   Physical Exam Well-developed well-nourished, female, in no acute distress.  BP right arm sitting position 130/68.  HEENT negative.  Neck was supple.  Lungs were clear       Assessment & Plan:  Hypertension at goal continue current medication, however, DC the Lasix because of the renal insufficiency, and rising creatinine.  BP checked daily.  Return in two weeks for follow-up.  Viral URI.  Plan Tylenol lots of liquids.  Hydromet.  Advised not to take any over-the-counter cough medicine, cold medicine or NSAIDs

## 2011-11-22 ENCOUNTER — Telehealth: Payer: Self-pay | Admitting: Family Medicine

## 2011-11-22 NOTE — Telephone Encounter (Signed)
Pt would like her lab results mailed to her home. Thanks.

## 2011-11-28 NOTE — Telephone Encounter (Signed)
Copy mailed.

## 2011-12-04 ENCOUNTER — Encounter: Payer: Self-pay | Admitting: Family Medicine

## 2011-12-04 ENCOUNTER — Ambulatory Visit (INDEPENDENT_AMBULATORY_CARE_PROVIDER_SITE_OTHER): Payer: Medicare Other | Admitting: Family Medicine

## 2011-12-04 DIAGNOSIS — I10 Essential (primary) hypertension: Secondary | ICD-10-CM

## 2011-12-04 DIAGNOSIS — E669 Obesity, unspecified: Secondary | ICD-10-CM | POA: Diagnosis not present

## 2011-12-04 DIAGNOSIS — N259 Disorder resulting from impaired renal tubular function, unspecified: Secondary | ICD-10-CM | POA: Diagnosis not present

## 2011-12-04 MED ORDER — LOSARTAN POTASSIUM 100 MG PO TABS: 100.0000 mg | ORAL_TABLET | Freq: Every day | ORAL | Status: DC

## 2011-12-04 NOTE — Progress Notes (Signed)
  Subjective:    Patient ID: Krista Payne, female    DOB: 11-03-1940, 71 y.o.   MRN: [id number]  HPI  Krista Payne is a 71 year old female nonsmoker who comes in today for followup of hypertension obesity and renal insufficiency  She is a transfer from Dr. Joni Fears. I have been working with her over the past couple months to get her blood pressure back to normal. Because of the prolonged elevation of her blood pressure she has renal insufficiency. We also been talking about diet exercise and weight loss. Today's weight is the same as before to 15. She's not following a diet she's not exercising. We again stressed the importance of these modalities  She takes 10 mg of Norvasc daily and 50 mg of low [place] daily BP 160/70 with her machine is 144/64. I recommend she get a new machine.   Review of Systems General and cardiovascular review of systems otherwise negative    Objective:   Physical Exam Well-developed overweight female in no acute distress BP right arm sitting position 170/70 pulse 70 and regular       Assessment & Plan:  Hypertension not at goal increase low [place] to 100 mg daily continue Norvasc 10 mg daily again encouraged a salt free diet exercise on regular basis BP checked daily with new blood pressure cuff followup in 3  week  Obesity continue diet exercise and weight loss  Renal insufficiency avoid diuretics and NSAIDs drink 3,,,,,,,,,,8 ounce glasses of water daily and

## 2011-12-04 NOTE — Patient Instructions (Signed)
Continue the Norvasc 10 mg daily  Increase the losartan to 100 mg daily  Check your blood pressure daily in the morning with a new digital pump up blood pressure cuff  Return in 3 weeks for followup  Remember to stay on a complete salt free diet, walk 20 minutes daily, and drink 24 ounces of water daily

## 2011-12-11 ENCOUNTER — Ambulatory Visit: Payer: Medicare Other | Admitting: Family Medicine

## 2011-12-25 ENCOUNTER — Ambulatory Visit: Payer: Medicare Other | Admitting: Family Medicine

## 2011-12-26 ENCOUNTER — Encounter: Payer: Self-pay | Admitting: Family Medicine

## 2011-12-26 ENCOUNTER — Ambulatory Visit (INDEPENDENT_AMBULATORY_CARE_PROVIDER_SITE_OTHER): Payer: Medicare Other | Admitting: Family Medicine

## 2011-12-26 DIAGNOSIS — N259 Disorder resulting from impaired renal tubular function, unspecified: Secondary | ICD-10-CM

## 2011-12-26 DIAGNOSIS — I10 Essential (primary) hypertension: Secondary | ICD-10-CM | POA: Diagnosis not present

## 2011-12-26 LAB — BASIC METABOLIC PANEL
BUN: 20 mg/dL (ref 6–23)
CO2: 24 mEq/L (ref 19–32)
Calcium: 10.2 mg/dL (ref 8.4–10.5)
Chloride: 104 mEq/L (ref 96–112)
Creatinine, Ser: 1.3 mg/dL — ABNORMAL HIGH (ref 0.4–1.2)
GFR: 42.53 mL/min — ABNORMAL LOW (ref >60.00)
Glucose, Bld: 135 mg/dL — ABNORMAL HIGH (ref 70–99)
Potassium: 4.7 mEq/L (ref 3.5–5.1)
Sodium: 138 mEq/L (ref 135–145)

## 2011-12-26 MED ORDER — ATENOLOL 25 MG PO TABS: 25.0000 mg | ORAL_TABLET | Freq: Every day | ORAL | Status: DC

## 2011-12-26 NOTE — Patient Instructions (Signed)
Recheck renal function today  Add Tenormin 25 mg daily in the morning  Continue the Norvasc 10 mg daily along with the Cozaar 100 mg daily  Take all 3 pills at once in the morning  Return in the first week in April for followup

## 2011-12-26 NOTE — Progress Notes (Signed)
  Subjective:    Patient ID: Larose Kells, female    DOB: 1941/04/05, 71 y.o.   MRN: [place]:[id number]  HPI Camil is a 71 year old female who comes in today for followup of hypertension currently on Norvasc 10 mg daily and we've increased her Cozaar to 100 mg daily however BP is 180/80 pulse 90 she says she is anxious and her blood pressure is goes up when she comes in here. When asked she says her blood pressure home is about the same as   Review of Systems    general and cardiovascular review of systems otherwise negative except for history of renal insufficiency Objective:   Physical Exam  BP right arm sitting position 180/80 pulse 90 and regular      Assessment & Plan:  Hypertension not at goal add low-dose beta blocker return in one month for followup

## 2012-01-29 ENCOUNTER — Ambulatory Visit (INDEPENDENT_AMBULATORY_CARE_PROVIDER_SITE_OTHER): Payer: Medicare Other | Admitting: Family Medicine

## 2012-01-29 ENCOUNTER — Encounter: Payer: Self-pay | Admitting: Family Medicine

## 2012-01-29 VITALS — BP 184/90 | Temp 98.4°F | Wt 216.0 lb

## 2012-01-29 DIAGNOSIS — R609 Edema, unspecified: Secondary | ICD-10-CM | POA: Diagnosis not present

## 2012-01-29 DIAGNOSIS — I10 Essential (primary) hypertension: Secondary | ICD-10-CM | POA: Diagnosis not present

## 2012-01-29 DIAGNOSIS — N259 Disorder resulting from impaired renal tubular function, unspecified: Secondary | ICD-10-CM

## 2012-01-29 MED ORDER — HYDROCHLOROTHIAZIDE 12.5 MG PO TABS: 12.5000 mg | ORAL_TABLET | Freq: Every day | ORAL | Status: DC

## 2012-01-29 MED ORDER — SIMVASTATIN 20 MG PO TABS: 20.0000 mg | ORAL_TABLET | Freq: Every day | ORAL | Status: DC

## 2012-01-29 MED ORDER — AMLODIPINE BESYLATE 10 MG PO TABS: 10.0000 mg | ORAL_TABLET | Freq: Every day | ORAL | Status: DC

## 2012-01-29 NOTE — Progress Notes (Signed)
  Subjective:    Patient ID: Krista Payne, female    DOB: [birth year], 71 y.o.   MRN: :[id number]  HPI Krista Payne is a 71 year old female who comes in today for evaluation of hypertension renal insufficiency and peripheral edema  Her blood pressure readings at home are averaging 135/80 however her blood pressure here today is 180/80. I question whether her home cuff is accurate. She states she lives too far away to bring it back and have it checked  Her weight is up to 216 pounds and she noticed her legs are swollen. She has a history of renal insufficiency and we had to take her off Lasix last winter because of the declining GFR. GFR stable at 46 with a creatinine of 1.3   Review of Systems    general and cardiovascular review of systems otherwise negative Objective:   Physical Exam Well-developed well-nourished female in no acute distress BP right arm sitting position repeat by me 180/80 pulse 80 and regular 2+ peripheral edema       Assessment & Plan:  Hypertension not at goal and now with marked peripheral edema and low-dose hydrochlorothiazide 25 mg daily followup in one week

## 2012-01-29 NOTE — Patient Instructions (Signed)
Continue your current medications  Add hydrochlorothiazide 12.5 mg one tablet daily  Blood pressure checked 3 times daily  Return in one week for followup  Complete salt free diet  Thigh high stockings,,,,,,,,,,,,,,,, no anklets  When you return in a week bring a record of all your blood pressure readings and the device

## 2012-02-06 ENCOUNTER — Encounter: Payer: Self-pay | Admitting: Family Medicine

## 2012-02-07 ENCOUNTER — Encounter: Payer: Self-pay | Admitting: Family Medicine

## 2012-02-07 ENCOUNTER — Ambulatory Visit (INDEPENDENT_AMBULATORY_CARE_PROVIDER_SITE_OTHER): Payer: Medicare Other | Admitting: Family Medicine

## 2012-02-07 DIAGNOSIS — L259 Unspecified contact dermatitis, unspecified cause: Secondary | ICD-10-CM | POA: Diagnosis not present

## 2012-02-07 DIAGNOSIS — L309 Dermatitis, unspecified: Secondary | ICD-10-CM

## 2012-02-07 DIAGNOSIS — E669 Obesity, unspecified: Secondary | ICD-10-CM | POA: Diagnosis not present

## 2012-02-07 DIAGNOSIS — I10 Essential (primary) hypertension: Secondary | ICD-10-CM | POA: Diagnosis not present

## 2012-02-07 DIAGNOSIS — N259 Disorder resulting from impaired renal tubular function, unspecified: Secondary | ICD-10-CM

## 2012-02-07 MED ORDER — TRIAMCINOLONE ACETONIDE 0.5 % EX OINT: TOPICAL_OINTMENT | Freq: Two times a day (BID) | CUTANEOUS | Status: DC

## 2012-02-07 NOTE — Patient Instructions (Signed)
Continue your current medications  Followup with your new doctor [facility] in 4-6 weeks to reevaluate your blood pressure and kidney function

## 2012-02-07 NOTE — Progress Notes (Signed)
  Subjective:    Patient ID: Krista Payne, female    DOB: [birth year], 71 y.o.   MRN: [place]:[id number]  HPI Krista Payne is a 71 year old female who lives in [place] who I picked up from Dr. Joni Fears retired who comes in today for followup of hypertension renal insufficiency and eczema  We have been working with her to control her blood pressure. She's currently on Norvasc 10 mg daily, Tenormin 25 mg daily, hydrochlorothiazide 12.5 mg daily, and Cozaar  100 mg daily.  She brings blood pressure readings from home all of which are normal however she states when she comes in here she gets nervous and her blood pressure goes up. She declines to let us check her blood pressure today and she did not bring her digital cuff with her. She states she went to [employer] and her blood pressures normal therefore she doesn't feel like she needs to take all this medicine anymore. She also states that she lives in [place] does not drive and finds it very difficult to come to [place] therefore recommended that she find a physician in her local community   Review of Systems    general and metabolic review of systems negative Objective:   Physical Exam Well-developed well-nourished female in no acute distress patient declines weight and blood pressure checks       Assessment & Plan:  Hypertension probably at goal followup with her doctor in [place]  Renal insufficiency monitor renal function carefully  Obesity again encouraged diet exercise and weight loss

## 2012-03-27 DIAGNOSIS — E782 Mixed hyperlipidemia: Secondary | ICD-10-CM | POA: Diagnosis not present

## 2012-03-27 DIAGNOSIS — I10 Essential (primary) hypertension: Secondary | ICD-10-CM | POA: Diagnosis not present

## 2012-03-27 DIAGNOSIS — G4700 Insomnia, unspecified: Secondary | ICD-10-CM | POA: Diagnosis not present

## 2012-04-23 DIAGNOSIS — G4700 Insomnia, unspecified: Secondary | ICD-10-CM | POA: Diagnosis not present

## 2012-04-23 DIAGNOSIS — R7301 Impaired fasting glucose: Secondary | ICD-10-CM | POA: Diagnosis not present

## 2012-04-23 DIAGNOSIS — I10 Essential (primary) hypertension: Secondary | ICD-10-CM | POA: Diagnosis not present

## 2012-04-23 DIAGNOSIS — E782 Mixed hyperlipidemia: Secondary | ICD-10-CM | POA: Diagnosis not present

## 2012-04-23 DIAGNOSIS — E785 Hyperlipidemia, unspecified: Secondary | ICD-10-CM | POA: Diagnosis not present

## 2012-04-25 DIAGNOSIS — I10 Essential (primary) hypertension: Secondary | ICD-10-CM | POA: Diagnosis not present

## 2012-04-25 DIAGNOSIS — G4700 Insomnia, unspecified: Secondary | ICD-10-CM | POA: Diagnosis not present

## 2012-04-25 DIAGNOSIS — E782 Mixed hyperlipidemia: Secondary | ICD-10-CM | POA: Diagnosis not present

## 2012-04-25 DIAGNOSIS — R7301 Impaired fasting glucose: Secondary | ICD-10-CM | POA: Diagnosis not present

## 2012-04-29 DIAGNOSIS — H4011X0 Primary open-angle glaucoma, stage unspecified: Secondary | ICD-10-CM | POA: Diagnosis not present

## 2012-04-29 DIAGNOSIS — H52229 Regular astigmatism, unspecified eye: Secondary | ICD-10-CM | POA: Diagnosis not present

## 2012-04-29 DIAGNOSIS — H2589 Other age-related cataract: Secondary | ICD-10-CM | POA: Diagnosis not present

## 2012-04-29 DIAGNOSIS — H353190 Nonexudative age-related macular degeneration, unspecified eye, stage unspecified: Secondary | ICD-10-CM | POA: Diagnosis not present

## 2012-06-10 DIAGNOSIS — H4011X0 Primary open-angle glaucoma, stage unspecified: Secondary | ICD-10-CM | POA: Diagnosis not present

## 2012-07-07 DIAGNOSIS — S93609A Unspecified sprain of unspecified foot, initial encounter: Secondary | ICD-10-CM | POA: Diagnosis not present

## 2012-07-25 DIAGNOSIS — E785 Hyperlipidemia, unspecified: Secondary | ICD-10-CM | POA: Diagnosis not present

## 2012-07-25 DIAGNOSIS — R7309 Other abnormal glucose: Secondary | ICD-10-CM | POA: Diagnosis not present

## 2012-07-25 DIAGNOSIS — R7301 Impaired fasting glucose: Secondary | ICD-10-CM | POA: Diagnosis not present

## 2012-07-25 DIAGNOSIS — E782 Mixed hyperlipidemia: Secondary | ICD-10-CM | POA: Diagnosis not present

## 2012-07-25 DIAGNOSIS — Z23 Encounter for immunization: Secondary | ICD-10-CM | POA: Diagnosis not present

## 2012-07-25 DIAGNOSIS — I10 Essential (primary) hypertension: Secondary | ICD-10-CM | POA: Diagnosis not present

## 2012-08-18 DIAGNOSIS — R609 Edema, unspecified: Secondary | ICD-10-CM | POA: Diagnosis not present

## 2012-08-18 DIAGNOSIS — S43499A Other sprain of unspecified shoulder joint, initial encounter: Secondary | ICD-10-CM | POA: Diagnosis not present

## 2012-08-18 DIAGNOSIS — S46819A Strain of other muscles, fascia and tendons at shoulder and upper arm level, unspecified arm, initial encounter: Secondary | ICD-10-CM | POA: Diagnosis not present

## 2012-10-28 DIAGNOSIS — D649 Anemia, unspecified: Secondary | ICD-10-CM | POA: Diagnosis not present

## 2012-10-28 DIAGNOSIS — E782 Mixed hyperlipidemia: Secondary | ICD-10-CM | POA: Diagnosis not present

## 2012-10-28 DIAGNOSIS — E785 Hyperlipidemia, unspecified: Secondary | ICD-10-CM | POA: Diagnosis not present

## 2012-10-28 DIAGNOSIS — I10 Essential (primary) hypertension: Secondary | ICD-10-CM | POA: Diagnosis not present

## 2012-10-28 DIAGNOSIS — E119 Type 2 diabetes mellitus without complications: Secondary | ICD-10-CM | POA: Diagnosis not present

## 2012-11-12 ENCOUNTER — Other Ambulatory Visit: Payer: Self-pay | Admitting: Internal Medicine

## 2012-11-12 ENCOUNTER — Other Ambulatory Visit: Payer: Self-pay | Admitting: Family Medicine

## 2012-11-12 DIAGNOSIS — Z1231 Encounter for screening mammogram for malignant neoplasm of breast: Secondary | ICD-10-CM

## 2012-12-16 ENCOUNTER — Ambulatory Visit
Admission: RE | Admit: 2012-12-16 | Discharge: 2012-12-16 | Disposition: A | Discharge status: Home / Self Care | Payer: Medicare Other | Source: Ambulatory Visit | Attending: Internal Medicine | Admitting: Internal Medicine

## 2012-12-16 DIAGNOSIS — Z1231 Encounter for screening mammogram for malignant neoplasm of breast: Secondary | ICD-10-CM | POA: Diagnosis not present

## 2012-12-16 IMAGING — MG MM DIGITAL SCREENING BILAT
4 series · 4 of 4 positions shown · non-contrast
Comparison: [DATE]

CLINICAL DATA: Screening.

DIGITAL BILATERAL SCREENING MAMMOGRAM WITH CAD

[R CC]
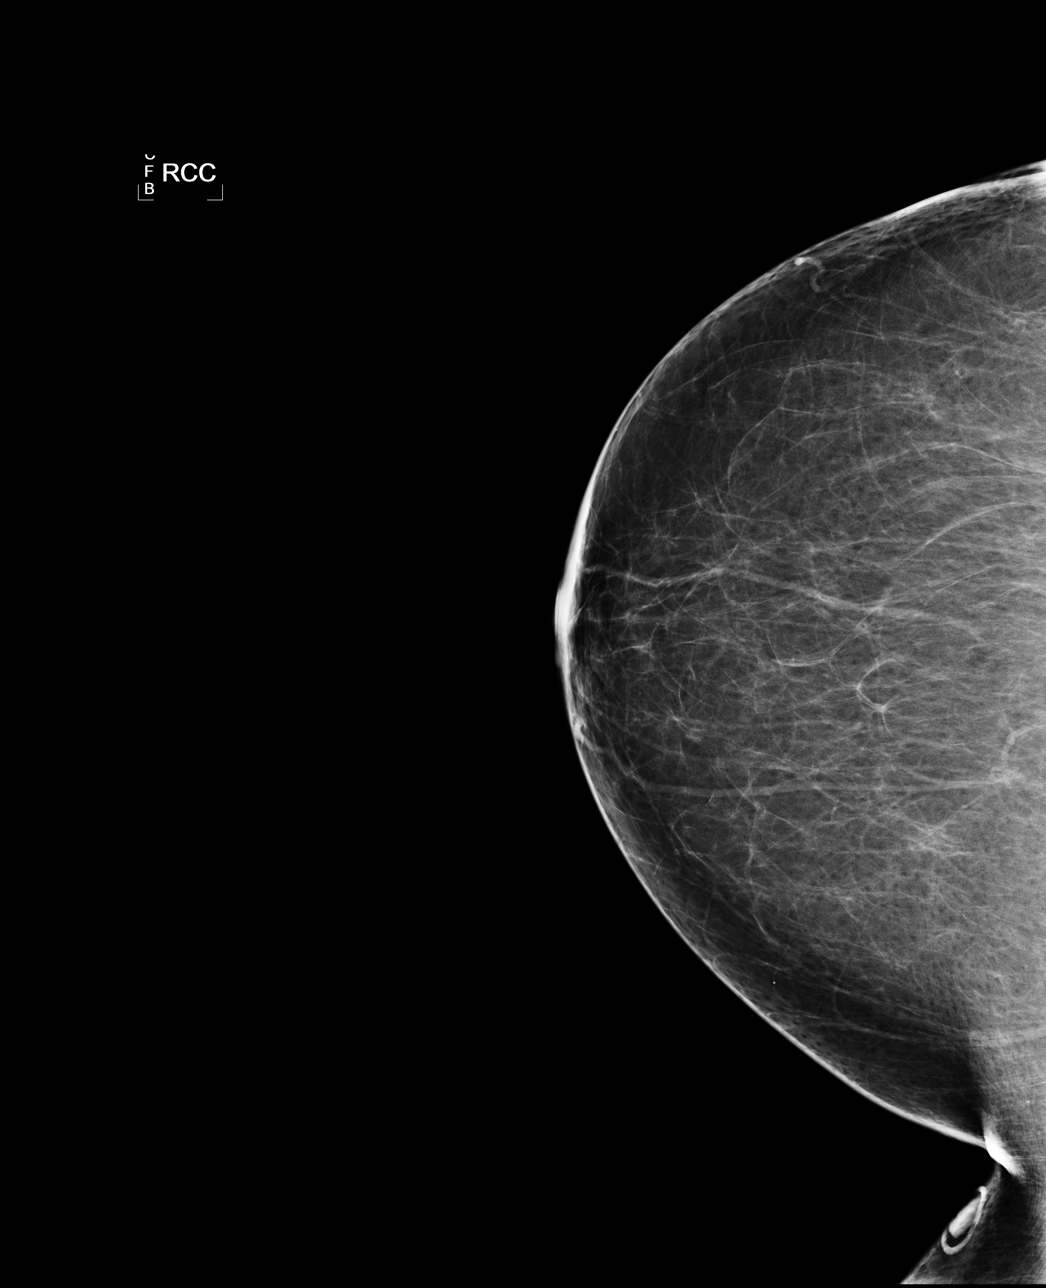

[L CC]
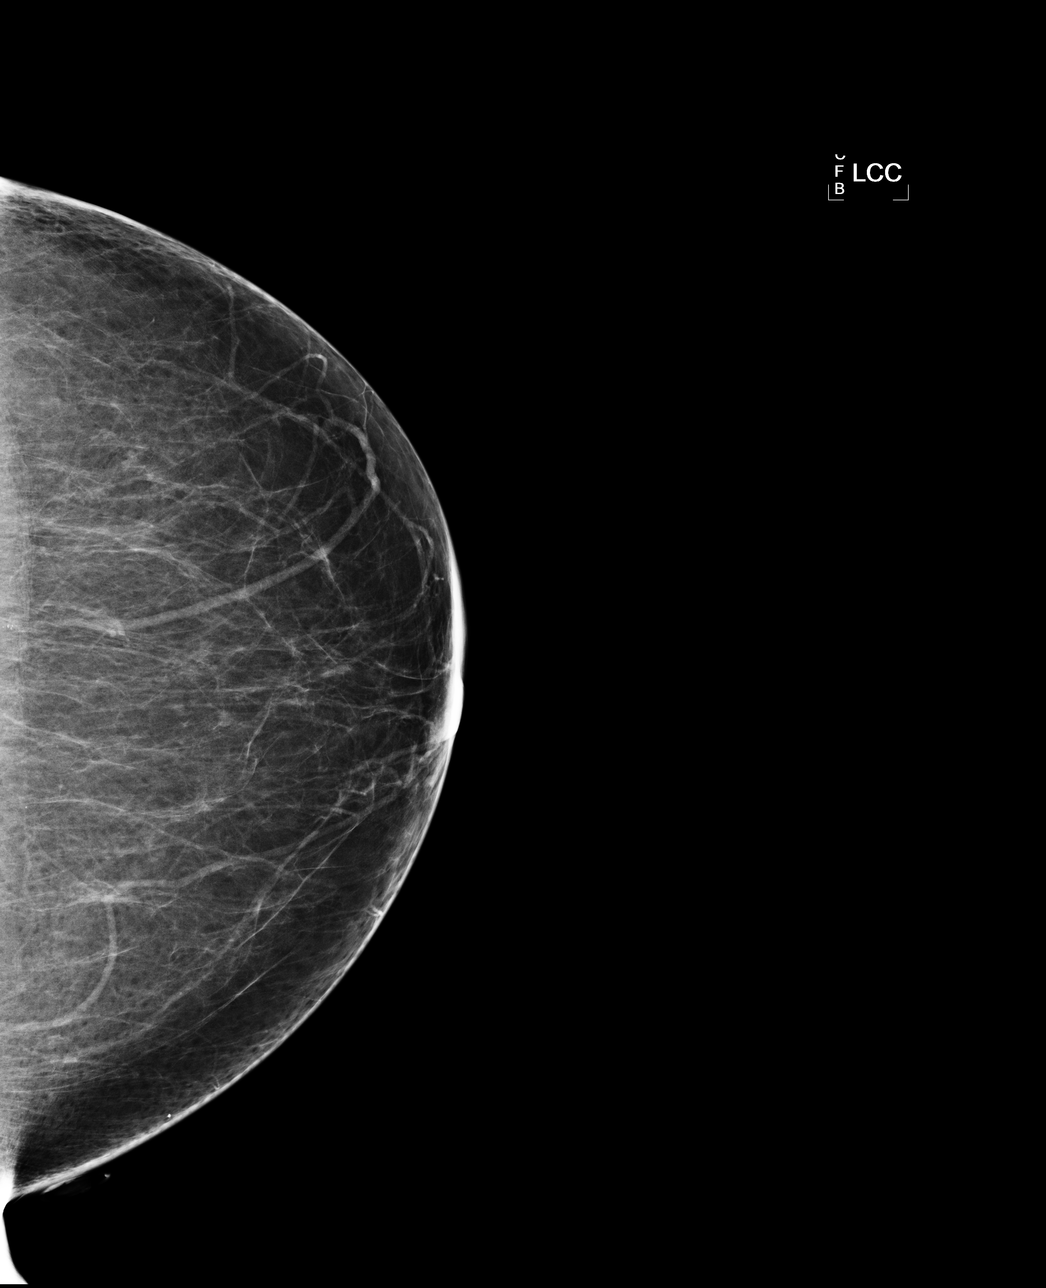

[L MLO]
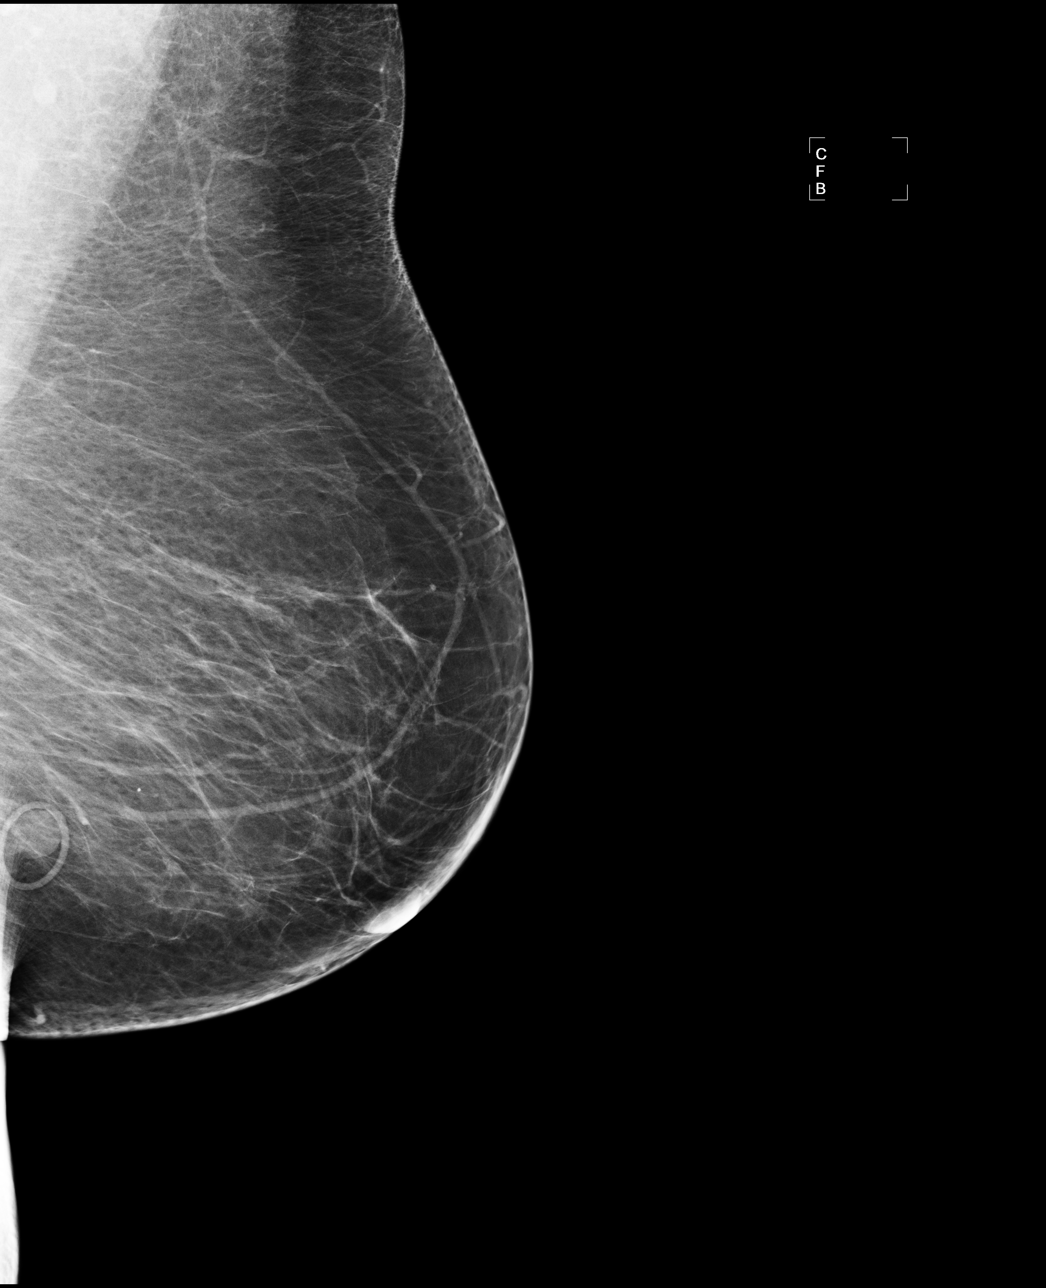

[R MLO]
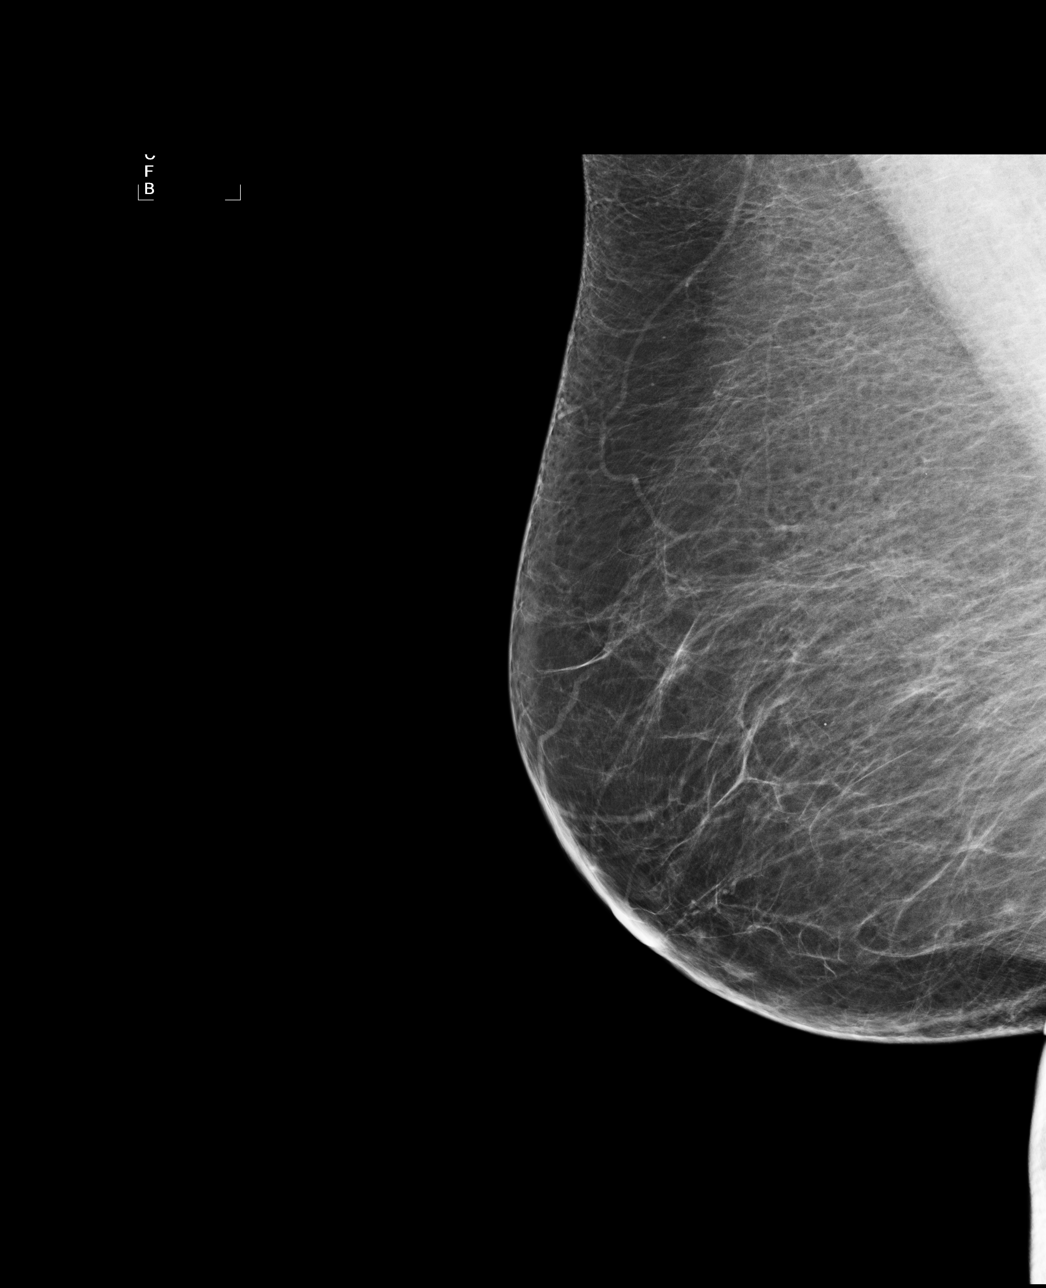

[4 of 4 positions shown; findings below may reference images not displayed]

FINDINGS: ACR Breast Density Category 1: The breast tissue is almost entirely
fatty.

There is no suspicious dominant mass, architectural distortion, or
calcification to suggest malignancy.

Images were processed with CAD.
IMPRESSION: No mammographic evidence of malignancy.

A result letter of this screening mammogram will be mailed directly
to the patient.

RECOMMENDATION:
Screening mammogram in one year. (Code:[5Y])

BI-RADS CATEGORY 1:  Negative.

## 2012-12-24 DIAGNOSIS — H4011X0 Primary open-angle glaucoma, stage unspecified: Secondary | ICD-10-CM | POA: Diagnosis not present

## 2013-05-05 DIAGNOSIS — H524 Presbyopia: Secondary | ICD-10-CM | POA: Diagnosis not present

## 2013-05-05 DIAGNOSIS — H5210 Myopia, unspecified eye: Secondary | ICD-10-CM | POA: Diagnosis not present

## 2013-05-05 DIAGNOSIS — H52229 Regular astigmatism, unspecified eye: Secondary | ICD-10-CM | POA: Diagnosis not present

## 2013-05-05 DIAGNOSIS — H4011X0 Primary open-angle glaucoma, stage unspecified: Secondary | ICD-10-CM | POA: Diagnosis not present

## 2013-05-14 DIAGNOSIS — F17200 Nicotine dependence, unspecified, uncomplicated: Secondary | ICD-10-CM | POA: Diagnosis not present

## 2013-05-14 DIAGNOSIS — E782 Mixed hyperlipidemia: Secondary | ICD-10-CM | POA: Diagnosis not present

## 2013-05-14 DIAGNOSIS — J329 Chronic sinusitis, unspecified: Secondary | ICD-10-CM | POA: Diagnosis not present

## 2013-06-12 DIAGNOSIS — E782 Mixed hyperlipidemia: Secondary | ICD-10-CM | POA: Diagnosis not present

## 2013-06-12 DIAGNOSIS — R059 Cough, unspecified: Secondary | ICD-10-CM | POA: Diagnosis not present

## 2013-06-12 DIAGNOSIS — I10 Essential (primary) hypertension: Secondary | ICD-10-CM | POA: Diagnosis not present

## 2013-06-12 DIAGNOSIS — E785 Hyperlipidemia, unspecified: Secondary | ICD-10-CM | POA: Diagnosis not present

## 2013-06-12 DIAGNOSIS — R05 Cough: Secondary | ICD-10-CM | POA: Diagnosis not present

## 2013-06-12 DIAGNOSIS — E559 Vitamin D deficiency, unspecified: Secondary | ICD-10-CM | POA: Diagnosis not present

## 2013-06-12 DIAGNOSIS — E119 Type 2 diabetes mellitus without complications: Secondary | ICD-10-CM | POA: Diagnosis not present

## 2013-06-30 DIAGNOSIS — Z23 Encounter for immunization: Secondary | ICD-10-CM | POA: Diagnosis not present

## 2013-10-13 DIAGNOSIS — I10 Essential (primary) hypertension: Secondary | ICD-10-CM | POA: Diagnosis not present

## 2013-10-13 DIAGNOSIS — E782 Mixed hyperlipidemia: Secondary | ICD-10-CM | POA: Diagnosis not present

## 2013-10-13 DIAGNOSIS — R7301 Impaired fasting glucose: Secondary | ICD-10-CM | POA: Diagnosis not present

## 2013-10-16 DIAGNOSIS — E119 Type 2 diabetes mellitus without complications: Secondary | ICD-10-CM | POA: Diagnosis not present

## 2013-10-16 DIAGNOSIS — E782 Mixed hyperlipidemia: Secondary | ICD-10-CM | POA: Diagnosis not present

## 2013-10-16 DIAGNOSIS — I10 Essential (primary) hypertension: Secondary | ICD-10-CM | POA: Diagnosis not present

## 2013-12-09 ENCOUNTER — Other Ambulatory Visit: Payer: Self-pay

## 2013-12-09 DIAGNOSIS — Z1231 Encounter for screening mammogram for malignant neoplasm of breast: Secondary | ICD-10-CM

## 2013-12-14 DIAGNOSIS — H4011X0 Primary open-angle glaucoma, stage unspecified: Secondary | ICD-10-CM | POA: Diagnosis not present

## 2014-01-14 ENCOUNTER — Ambulatory Visit
Admission: RE | Admit: 2014-01-14 | Discharge: 2014-01-14 | Disposition: A | Discharge status: Home / Self Care | Payer: Medicare Other | Source: Ambulatory Visit

## 2014-01-14 DIAGNOSIS — Z1231 Encounter for screening mammogram for malignant neoplasm of breast: Secondary | ICD-10-CM | POA: Diagnosis not present

## 2014-01-14 IMAGING — MG MM SCREEN MAMMOGRAM BILATERAL
5 series · 5 of 5 positions shown · non-contrast
Comparison: Previous exam(s).

CLINICAL DATA: Screening.

EXAM:
DIGITAL SCREENING BILATERAL MAMMOGRAM WITH CAD

[R CC (1 of 2)]
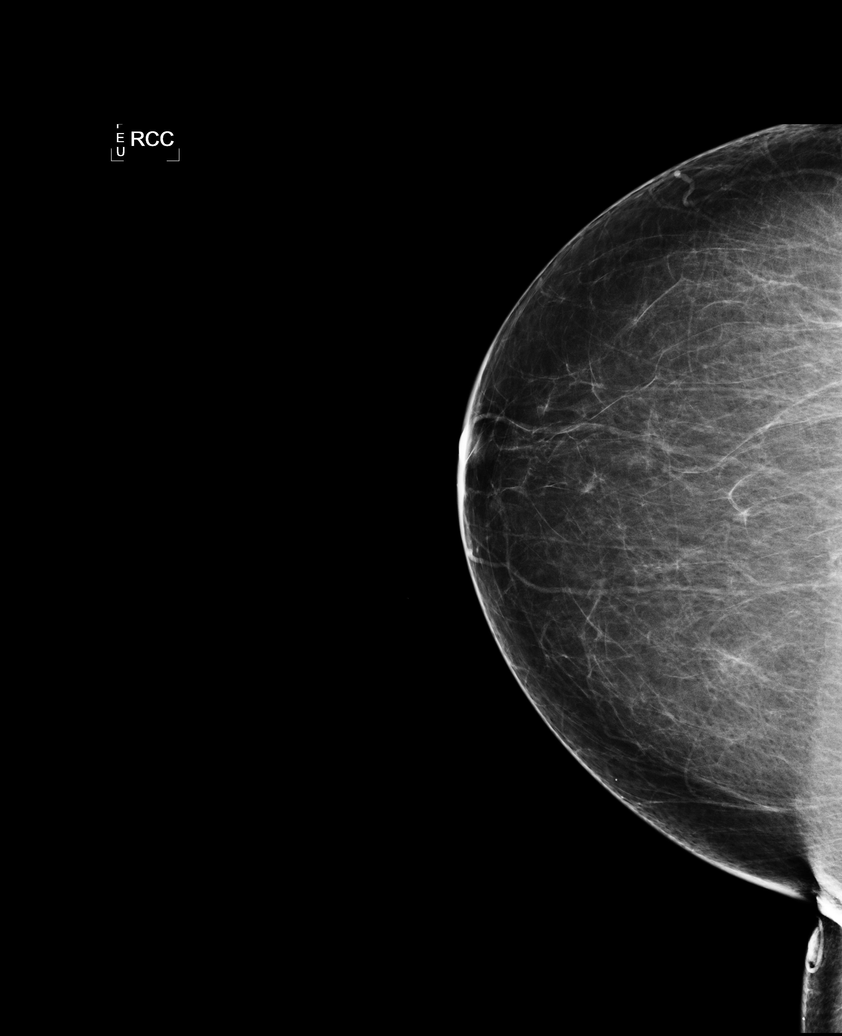

[L CC]
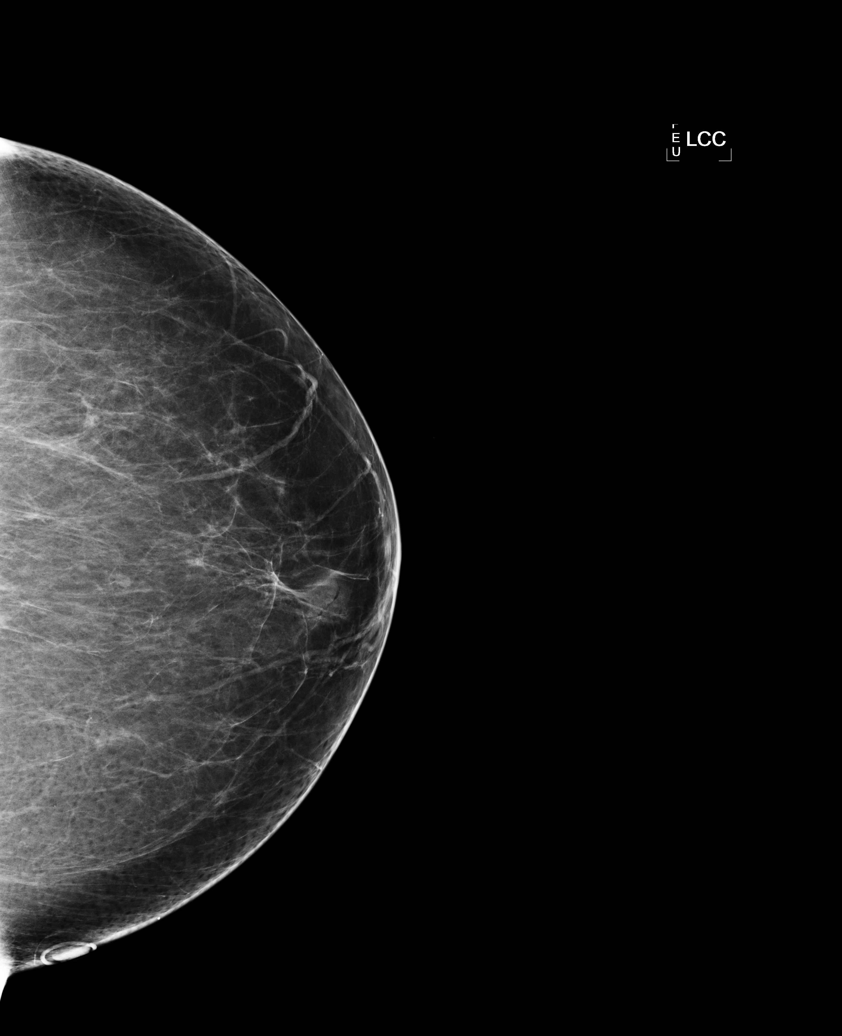

[L MLO]
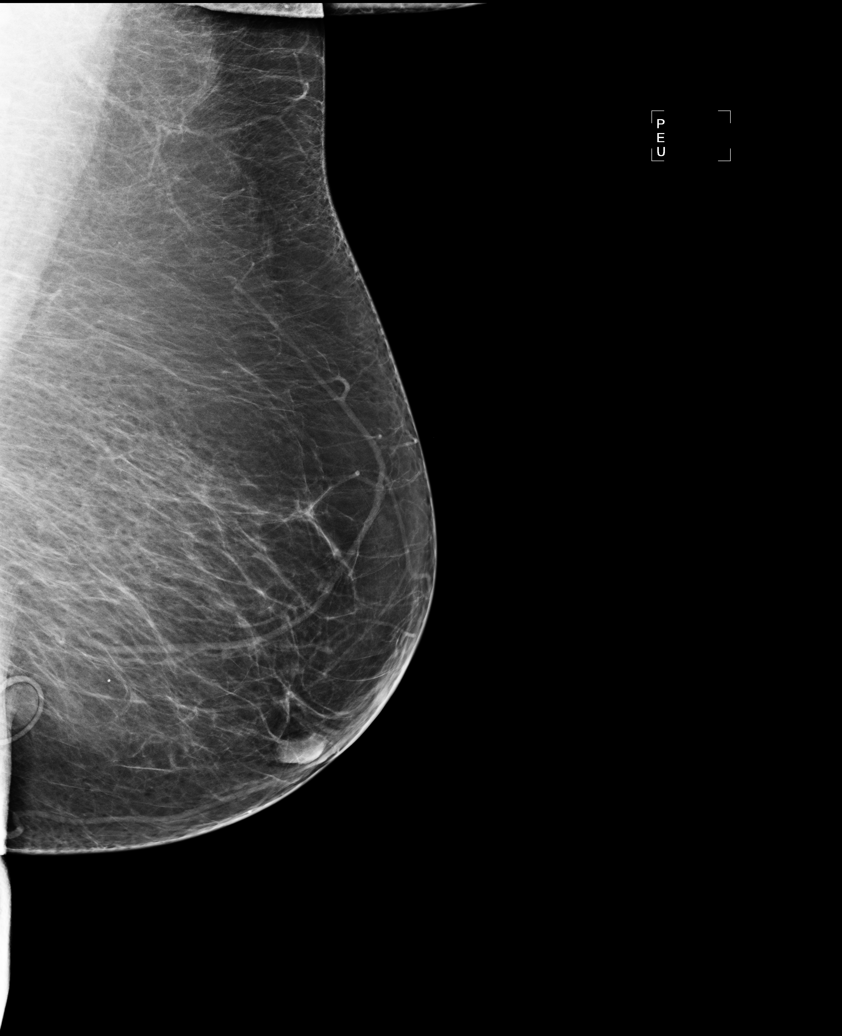

[R MLO]
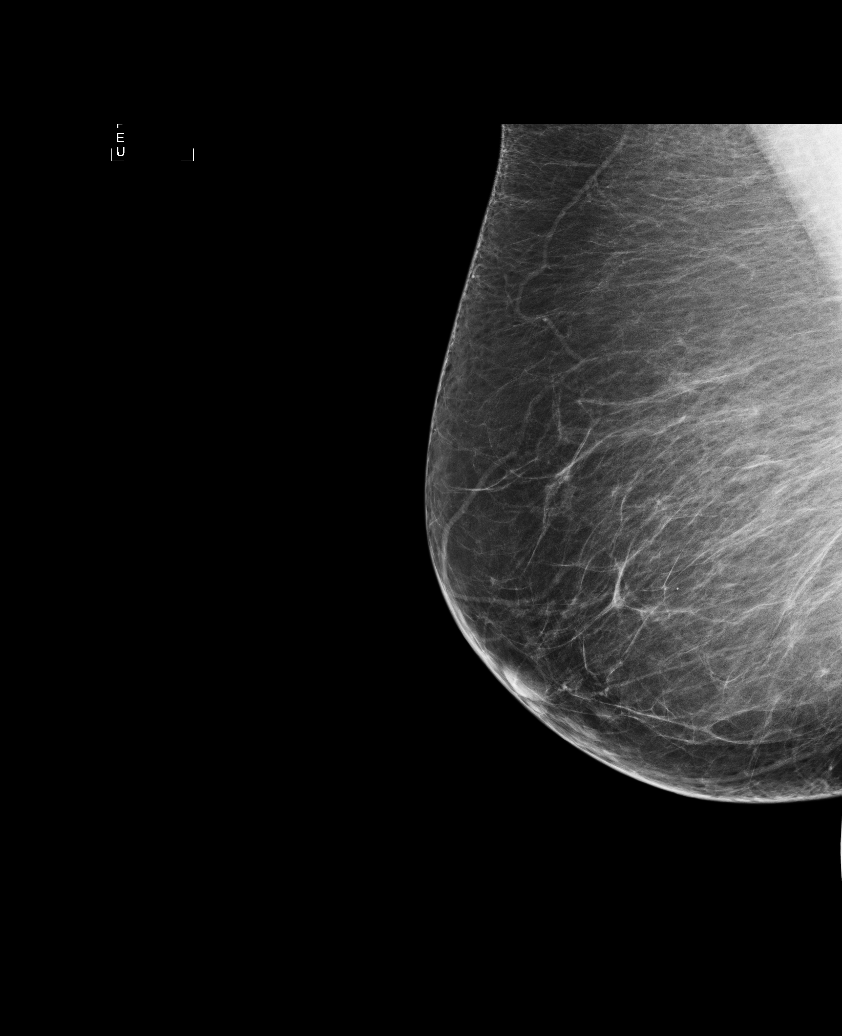

[R CC (2 of 2)]
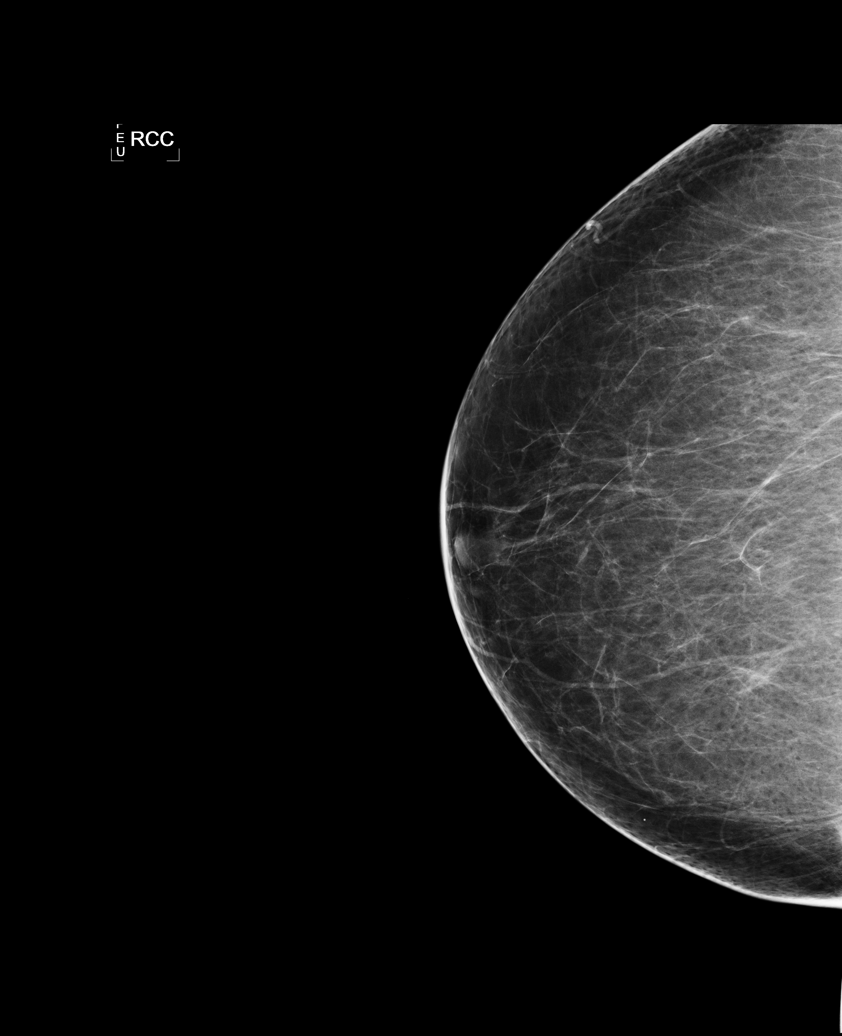

[5 of 5 positions shown; findings below may reference images not displayed]

ACR Breast Density Category b: There are scattered areas of
fibroglandular density.
FINDINGS: There are no findings suspicious for malignancy. Images were
processed with CAD.
IMPRESSION: No mammographic evidence of malignancy. A result letter of this
screening mammogram will be mailed directly to the patient.

RECOMMENDATION:
Screening mammogram in one year. (Code:[US])

BI-RADS CATEGORY  1: Negative.

## 2014-02-04 DIAGNOSIS — I10 Essential (primary) hypertension: Secondary | ICD-10-CM | POA: Diagnosis not present

## 2014-02-04 DIAGNOSIS — E785 Hyperlipidemia, unspecified: Secondary | ICD-10-CM | POA: Diagnosis not present

## 2014-02-04 DIAGNOSIS — E119 Type 2 diabetes mellitus without complications: Secondary | ICD-10-CM | POA: Diagnosis not present

## 2014-02-08 DIAGNOSIS — R944 Abnormal results of kidney function studies: Secondary | ICD-10-CM | POA: Diagnosis not present

## 2014-02-08 DIAGNOSIS — E782 Mixed hyperlipidemia: Secondary | ICD-10-CM | POA: Diagnosis not present

## 2014-02-08 DIAGNOSIS — E119 Type 2 diabetes mellitus without complications: Secondary | ICD-10-CM | POA: Diagnosis not present

## 2014-02-08 DIAGNOSIS — I10 Essential (primary) hypertension: Secondary | ICD-10-CM | POA: Diagnosis not present

## 2014-03-08 DIAGNOSIS — I10 Essential (primary) hypertension: Secondary | ICD-10-CM | POA: Diagnosis not present

## 2014-03-08 DIAGNOSIS — R944 Abnormal results of kidney function studies: Secondary | ICD-10-CM | POA: Diagnosis not present

## 2014-05-07 DIAGNOSIS — R7301 Impaired fasting glucose: Secondary | ICD-10-CM | POA: Diagnosis not present

## 2014-05-07 DIAGNOSIS — E782 Mixed hyperlipidemia: Secondary | ICD-10-CM | POA: Diagnosis not present

## 2014-05-07 DIAGNOSIS — I10 Essential (primary) hypertension: Secondary | ICD-10-CM | POA: Diagnosis not present

## 2014-05-11 DIAGNOSIS — E782 Mixed hyperlipidemia: Secondary | ICD-10-CM | POA: Diagnosis not present

## 2014-05-11 DIAGNOSIS — I10 Essential (primary) hypertension: Secondary | ICD-10-CM | POA: Diagnosis not present

## 2014-05-11 DIAGNOSIS — E669 Obesity, unspecified: Secondary | ICD-10-CM | POA: Diagnosis not present

## 2014-05-11 DIAGNOSIS — E119 Type 2 diabetes mellitus without complications: Secondary | ICD-10-CM | POA: Diagnosis not present

## 2014-06-14 DIAGNOSIS — H409 Unspecified glaucoma: Secondary | ICD-10-CM | POA: Diagnosis not present

## 2014-06-14 DIAGNOSIS — H4011X0 Primary open-angle glaucoma, stage unspecified: Secondary | ICD-10-CM | POA: Diagnosis not present

## 2014-08-17 DIAGNOSIS — Z23 Encounter for immunization: Secondary | ICD-10-CM | POA: Diagnosis not present

## 2014-09-14 DIAGNOSIS — E119 Type 2 diabetes mellitus without complications: Secondary | ICD-10-CM | POA: Diagnosis not present

## 2014-09-14 DIAGNOSIS — I10 Essential (primary) hypertension: Secondary | ICD-10-CM | POA: Diagnosis not present

## 2014-09-14 DIAGNOSIS — E785 Hyperlipidemia, unspecified: Secondary | ICD-10-CM | POA: Diagnosis not present

## 2014-09-22 DIAGNOSIS — Z23 Encounter for immunization: Secondary | ICD-10-CM | POA: Diagnosis not present

## 2014-09-22 DIAGNOSIS — E782 Mixed hyperlipidemia: Secondary | ICD-10-CM | POA: Diagnosis not present

## 2014-09-22 DIAGNOSIS — I10 Essential (primary) hypertension: Secondary | ICD-10-CM | POA: Diagnosis not present

## 2014-09-22 DIAGNOSIS — E1165 Type 2 diabetes mellitus with hyperglycemia: Secondary | ICD-10-CM | POA: Diagnosis not present

## 2014-11-29 DIAGNOSIS — H4011X2 Primary open-angle glaucoma, moderate stage: Secondary | ICD-10-CM | POA: Diagnosis not present

## 2014-11-29 DIAGNOSIS — H5010 Unspecified exotropia: Secondary | ICD-10-CM | POA: Diagnosis not present

## 2014-11-29 DIAGNOSIS — H25819 Combined forms of age-related cataract, unspecified eye: Secondary | ICD-10-CM | POA: Diagnosis not present

## 2014-12-15 DIAGNOSIS — I10 Essential (primary) hypertension: Secondary | ICD-10-CM | POA: Diagnosis not present

## 2014-12-15 DIAGNOSIS — E1165 Type 2 diabetes mellitus with hyperglycemia: Secondary | ICD-10-CM | POA: Diagnosis not present

## 2014-12-20 DIAGNOSIS — I10 Essential (primary) hypertension: Secondary | ICD-10-CM | POA: Diagnosis not present

## 2014-12-20 DIAGNOSIS — R7301 Impaired fasting glucose: Secondary | ICD-10-CM | POA: Diagnosis not present

## 2014-12-20 DIAGNOSIS — E782 Mixed hyperlipidemia: Secondary | ICD-10-CM | POA: Diagnosis not present

## 2014-12-20 DIAGNOSIS — Z23 Encounter for immunization: Secondary | ICD-10-CM | POA: Diagnosis not present

## 2014-12-24 DIAGNOSIS — J209 Acute bronchitis, unspecified: Secondary | ICD-10-CM | POA: Diagnosis not present

## 2014-12-24 DIAGNOSIS — J069 Acute upper respiratory infection, unspecified: Secondary | ICD-10-CM | POA: Diagnosis not present

## 2015-01-17 DIAGNOSIS — H25811 Combined forms of age-related cataract, right eye: Secondary | ICD-10-CM | POA: Diagnosis not present

## 2015-01-17 DIAGNOSIS — H3531 Nonexudative age-related macular degeneration: Secondary | ICD-10-CM | POA: Diagnosis not present

## 2015-01-17 DIAGNOSIS — H5010 Unspecified exotropia: Secondary | ICD-10-CM | POA: Diagnosis not present

## 2015-01-17 DIAGNOSIS — H40023 Open angle with borderline findings, high risk, bilateral: Secondary | ICD-10-CM | POA: Diagnosis not present

## 2015-02-01 NOTE — Patient Instructions (Signed)
Krista Payne  02/01/2015   Your procedure is scheduled on:  02/07/2015  Report to [facility] at 7:00 AM.  Call this number if you have problems the morning of surgery: [phone]   Remember:   Do not eat food or drink liquids after midnight.   Take these medicines the morning of surgery with A SIP OF WATER: Elavil, Amlodipine, Atenolol, Cozaar   Do not wear jewelry, make-up or nail polish.  Do not wear lotions, powders, or perfumes. You may wear deodorant.  Do not shave 48 hours prior to surgery. Men may shave face and neck.  Do not bring valuables to the hospital.  [facility] is not responsible for any belongings or valuables.               Contacts, dentures or bridgework may not be worn into surgery.  Leave suitcase in the car. After surgery it may be brought to your room.  For patients admitted to the hospital, discharge time is determined by your treatment team.               Patients discharged the day of surgery will not be allowed to drive home.  Name and phone number of your driver:   Special Instructions: N/A   Please read over the following fact sheets that you were given: Anesthesia Post-op Instructions   PATIENT INSTRUCTIONS POST-ANESTHESIA  IMMEDIATELY FOLLOWING SURGERY:  Do not drive or operate machinery for the first twenty four hours after surgery.  Do not make any important decisions for twenty four hours after surgery or while taking narcotic pain medications or sedatives.  If you develop intractable nausea and vomiting or a severe headache please notify your doctor immediately.  FOLLOW-UP:  Please make an appointment with your surgeon as instructed. You do not need to follow up with anesthesia unless specifically instructed to do so.  WOUND CARE INSTRUCTIONS (if applicable):  Keep a dry clean dressing on the anesthesia/puncture wound site if there is drainage.  Once the wound has quit draining you may leave it open to air.  Generally you should leave the  bandage intact for twenty four hours unless there is drainage.  If the epidural site drains for more than 36-48 hours please call the anesthesia department.  QUESTIONS?:  Please feel free to call your physician or the hospital operator if you have any questions, and they will be happy to assist you.      Cataract Surgery  A cataract is a clouding of the lens of the eye. When a lens becomes cloudy, vision is reduced based on the degree and nature of the clouding. Surgery may be needed to improve vision. Surgery removes the cloudy lens and usually replaces it with a substitute lens (intraocular lens, IOL). LET YOUR EYE DOCTOR KNOW ABOUT:  Allergies to food or medicine.  Medicines taken including herbs, eye drops, over-the-counter medicines, and creams.  Use of steroids (by mouth or creams).  Previous problems with anesthetics or numbing medicine.  History of bleeding problems or blood clots.  Previous surgery.  Other health problems, including diabetes and kidney problems.  Possibility of pregnancy, if this applies. RISKS AND COMPLICATIONS  Infection.  Inflammation of the eyeball (endophthalmitis) that can spread to both eyes (sympathetic ophthalmia).  Poor wound healing.  If an IOL is inserted, it can later fall out of proper position. This is very uncommon.  Clouding of the part of your eye that holds an IOL in place. This is called  an "after-cataract." These are uncommon but easily treated. BEFORE THE PROCEDURE  Do not eat or drink anything except small amounts of water for 8 to 12 before your surgery, or as directed by your caregiver.  Unless you are told otherwise, continue any eye drops you have been prescribed.  Talk to your primary caregiver about all other medicines that you take (both prescription and nonprescription). In some cases, you may need to stop or change medicines near the time of your surgery. This is most important if you are taking blood-thinning  medicine.Do not stop medicines unless you are told to do so.  Arrange for someone to drive you to and from the procedure.  Do not put contact lenses in either eye on the day of your surgery. PROCEDURE There is more than one method for safely removing a cataract. Your doctor can explain the differences and help determine which is best for you. Phacoemulsification surgery is the most common form of cataract surgery.  An injection is given behind the eye or eye drops are given to make this a painless procedure.  A small cut (incision) is made on the edge of the clear, dome-shaped surface that covers the front of the eye (cornea).  A tiny probe is painlessly inserted into the eye. This device gives off ultrasound waves that soften and break up the cloudy center of the lens. This makes it easier for the cloudy lens to be removed by suction.  An IOL may be implanted.  The normal lens of the eye is covered by a clear capsule. Part of that capsule is intentionally left in the eye to support the IOL.  Your surgeon may or may not use stitches to close the incision. There are other forms of cataract surgery that require a larger incision and stitches to close the eye. This approach is taken in cases where the doctor feels that the cataract cannot be easily removed using phacoemulsification. AFTER THE PROCEDURE  When an IOL is implanted, it does not need care. It becomes a permanent part of your eye and cannot be seen or felt.  Your doctor will schedule follow-up exams to check on your progress.  Review your other medicines with your doctor to see which can be resumed after surgery.  Use eye drops or take medicine as prescribed by your doctor. Document Released: 10/04/2011 Document Revised: 03/01/2014 Document Reviewed: 10/04/2011 [employer] Patient Information 2015 [place], [place]. This information is not intended to replace advice given to you by your health care provider. Make sure you discuss  any questions you have with your health care provider.

## 2015-02-03 ENCOUNTER — Encounter (HOSPITAL_COMMUNITY): Payer: Self-pay

## 2015-02-03 ENCOUNTER — Encounter (HOSPITAL_COMMUNITY)
Admission: RE | Admit: 2015-02-03 | Discharge: 2015-02-03 | Disposition: A | Discharge status: Home / Self Care | Payer: Medicare Other | Source: Ambulatory Visit | Attending: Ophthalmology | Admitting: Ophthalmology

## 2015-02-03 ENCOUNTER — Other Ambulatory Visit: Payer: Self-pay

## 2015-02-03 DIAGNOSIS — Z01810 Encounter for preprocedural cardiovascular examination: Secondary | ICD-10-CM | POA: Diagnosis not present

## 2015-02-03 DIAGNOSIS — Z01812 Encounter for preprocedural laboratory examination: Secondary | ICD-10-CM | POA: Diagnosis not present

## 2015-02-03 LAB — BASIC METABOLIC PANEL
Anion gap: 7 (ref 5–15)
BUN: 22 mg/dL (ref 6–23)
CO2: 26 mmol/L (ref 19–32)
Calcium: 9.8 mg/dL (ref 8.4–10.5)
Chloride: 105 mmol/L (ref 96–112)
Creatinine, Ser: 1.13 mg/dL — ABNORMAL HIGH (ref 0.50–1.10)
GFR calc Af Amer: 54 mL/min — ABNORMAL LOW (ref >90)
GFR calc non Af Amer: 47 mL/min — ABNORMAL LOW (ref >90)
Glucose, Bld: 139 mg/dL — ABNORMAL HIGH (ref 70–99)
Potassium: 4.8 mmol/L (ref 3.5–5.1)
Sodium: 138 mmol/L (ref 135–145)

## 2015-02-03 LAB — CBC
HCT: 37.6 % (ref 36.0–46.0)
Hemoglobin: 12.2 g/dL (ref 12.0–15.0)
MCH: 29.5 pg (ref 26.0–34.0)
MCHC: 32.4 g/dL (ref 30.0–36.0)
MCV: 91 fL (ref 78.0–100.0)
Platelets: 405 10*3/uL — ABNORMAL HIGH (ref 150–400)
RBC: 4.13 MIL/uL (ref 3.87–5.11)
RDW: 13.1 % (ref 11.5–15.5)
WBC: 8.1 10*3/uL (ref 4.0–10.5)

## 2015-02-04 MED ORDER — TETRACAINE HCL 0.5 % OP SOLN
OPHTHALMIC | Status: AC
  Filled 2015-02-04: qty 2

## 2015-02-04 MED ORDER — LIDOCAINE HCL (PF) 1 % IJ SOLN
INTRAMUSCULAR | Status: AC
  Filled 2015-02-04: qty 2

## 2015-02-04 MED ORDER — PHENYLEPHRINE HCL 2.5 % OP SOLN
OPHTHALMIC | Status: AC
  Filled 2015-02-04: qty 15

## 2015-02-04 MED ORDER — CYCLOPENTOLATE-PHENYLEPHRINE OP SOLN OPTIME - NO CHARGE
OPHTHALMIC | Status: AC
  Filled 2015-02-04: qty 2

## 2015-02-04 MED ORDER — NEOMYCIN-POLYMYXIN-DEXAMETH 3.5-10000-0.1 OP SUSP
OPHTHALMIC | Status: AC
  Filled 2015-02-04: qty 5

## 2015-02-04 MED ORDER — LIDOCAINE HCL 3.5 % OP GEL
OPHTHALMIC | Status: AC
  Filled 2015-02-04: qty 1

## 2015-02-07 ENCOUNTER — Encounter (HOSPITAL_COMMUNITY)
Admission: RE | Disposition: A | Discharge status: Home / Self Care | Payer: Self-pay | Source: Ambulatory Visit | Attending: Ophthalmology

## 2015-02-07 ENCOUNTER — Ambulatory Visit (HOSPITAL_COMMUNITY): Payer: Medicare Other | Admitting: Anesthesiology

## 2015-02-07 ENCOUNTER — Encounter (HOSPITAL_COMMUNITY): Payer: Self-pay | Admitting: *Deleted

## 2015-02-07 ENCOUNTER — Ambulatory Visit (HOSPITAL_COMMUNITY)
Admission: RE | Admit: 2015-02-07 | Discharge: 2015-02-07 | Disposition: A | Discharge status: Home / Self Care | Payer: Medicare Other | Source: Ambulatory Visit | Attending: Ophthalmology | Admitting: Ophthalmology

## 2015-02-07 DIAGNOSIS — Z79899 Other long term (current) drug therapy: Secondary | ICD-10-CM | POA: Diagnosis not present

## 2015-02-07 DIAGNOSIS — H25811 Combined forms of age-related cataract, right eye: Secondary | ICD-10-CM | POA: Insufficient documentation

## 2015-02-07 DIAGNOSIS — M1990 Unspecified osteoarthritis, unspecified site: Secondary | ICD-10-CM | POA: Insufficient documentation

## 2015-02-07 DIAGNOSIS — H259 Unspecified age-related cataract: Secondary | ICD-10-CM | POA: Diagnosis not present

## 2015-02-07 DIAGNOSIS — I10 Essential (primary) hypertension: Secondary | ICD-10-CM | POA: Diagnosis not present

## 2015-02-07 DIAGNOSIS — H269 Unspecified cataract: Secondary | ICD-10-CM | POA: Diagnosis present

## 2015-02-07 HISTORY — PX: CATARACT EXTRACTION W/PHACO: SHX586

## 2015-02-07 SURGERY — PHACOEMULSIFICATION, CATARACT, WITH IOL INSERTION
Anesthesia: Monitor Anesthesia Care | Site: Eye | Laterality: Right

## 2015-02-07 MED ORDER — NA HYALUR & NA CHOND-NA HYALUR 0.55-0.5 ML IO KIT
PACK | INTRAOCULAR | Status: DC | PRN
  Administered 2015-02-07: 1 via OPHTHALMIC

## 2015-02-07 MED ORDER — LACTATED RINGERS IV SOLN
INTRAVENOUS | Status: DC
  Administered 2015-02-07: 1000 mL via INTRAVENOUS

## 2015-02-07 MED ORDER — MIDAZOLAM HCL 2 MG/2ML IJ SOLN
INTRAMUSCULAR | Status: AC
  Filled 2015-02-07: qty 2

## 2015-02-07 MED ORDER — KETOROLAC TROMETHAMINE 0.5 % OP SOLN: 1.0000 [drp] | OPHTHALMIC | Status: AC

## 2015-02-07 MED ORDER — BSS IO SOLN
INTRAOCULAR | Status: DC | PRN
  Administered 2015-02-07: 15 mL

## 2015-02-07 MED ORDER — MIDAZOLAM HCL 2 MG/2ML IJ SOLN
1.0000 mg | INTRAMUSCULAR | Status: DC | PRN
  Administered 2015-02-07: 2 mg via INTRAVENOUS

## 2015-02-07 MED ORDER — FENTANYL CITRATE 0.05 MG/ML IJ SOLN
25.0000 ug | INTRAMUSCULAR | Status: AC
  Administered 2015-02-07 (×2): 25 ug via INTRAVENOUS

## 2015-02-07 MED ORDER — LIDOCAINE HCL 3.5 % OP GEL
1.0000 "application " | Freq: Once | OPHTHALMIC | Status: AC
  Administered 2015-02-07: 1 via OPHTHALMIC

## 2015-02-07 MED ORDER — LIDOCAINE HCL (PF) 1 % IJ SOLN
INTRAMUSCULAR | Status: DC | PRN
  Administered 2015-02-07: .8 mL

## 2015-02-07 MED ORDER — NEOMYCIN-POLYMYXIN-DEXAMETH 3.5-10000-0.1 OP SUSP
OPHTHALMIC | Status: DC | PRN
  Administered 2015-02-07: 1 [drp] via OPHTHALMIC

## 2015-02-07 MED ORDER — EPINEPHRINE HCL 1 MG/ML IJ SOLN
INTRAOCULAR | Status: DC | PRN
  Administered 2015-02-07: 500 mL

## 2015-02-07 MED ORDER — LACTATED RINGERS IV SOLN
INTRAVENOUS | Status: DC | PRN
  Administered 2015-02-07: 08:00:00 via INTRAVENOUS

## 2015-02-07 MED ORDER — FENTANYL CITRATE 0.05 MG/ML IJ SOLN
INTRAMUSCULAR | Status: AC
  Filled 2015-02-07: qty 2

## 2015-02-07 MED ORDER — PHENYLEPHRINE HCL 2.5 % OP SOLN
1.0000 [drp] | OPHTHALMIC | Status: AC
  Administered 2015-02-07 (×2): 1 [drp] via OPHTHALMIC

## 2015-02-07 MED ORDER — TETRACAINE HCL 0.5 % OP SOLN
1.0000 [drp] | OPHTHALMIC | Status: AC
  Administered 2015-02-07 (×2): 1 [drp] via OPHTHALMIC

## 2015-02-07 MED ORDER — CYCLOPENTOLATE-PHENYLEPHRINE 0.2-1 % OP SOLN
1.0000 [drp] | OPHTHALMIC | Status: AC
  Administered 2015-02-07 (×3): 1 [drp] via OPHTHALMIC

## 2015-02-07 MED ORDER — POVIDONE-IODINE 5 % OP SOLN
OPHTHALMIC | Status: DC | PRN
  Administered 2015-02-07: 1 via OPHTHALMIC

## 2015-02-07 MED ORDER — EPINEPHRINE HCL 1 MG/ML IJ SOLN
INTRAMUSCULAR | Status: AC
  Filled 2015-02-07: qty 1

## 2015-02-07 SURGICAL SUPPLY — 10 items
CLOTH BEACON ORANGE TIMEOUT ST (SAFETY) ×2 IMPLANT
EYE SHIELD UNIVERSAL CLEAR (GAUZE/BANDAGES/DRESSINGS) ×2 IMPLANT
GLOVE BIOGEL PI IND STRL 7.0 (GLOVE) ×1 IMPLANT
GLOVE BIOGEL PI INDICATOR 7.0 (GLOVE) ×1
GLOVE EXAM NITRILE MD LF STRL (GLOVE) ×2 IMPLANT
PAD ARMBOARD 7.5X6 YLW CONV (MISCELLANEOUS) ×2 IMPLANT
SIGHTPATH CAT PROC W REG LENS (Ophthalmic Related) ×2 IMPLANT
SYRINGE LUER LOK 1CC (MISCELLANEOUS) ×2 IMPLANT
TAPE TRANSPORE STRL 2 31045 (GAUZE/BANDAGES/DRESSINGS) ×2 IMPLANT
WATER STERILE IRR 250ML POUR (IV SOLUTION) ×2 IMPLANT

## 2015-02-07 NOTE — H&P (Signed)
I have reviewed the H&P, the patient was re-examined, and I have identified no interval changes in medical condition and plan of care since the history and physical of record  

## 2015-02-07 NOTE — Transfer of Care (Signed)
Immediate Anesthesia Transfer of Care Note  Patient: Krista Payne  Procedure(s) Performed: Procedure(s) with comments: CATARACT EXTRACTION PHACO AND INTRAOCULAR LENS PLACEMENT (IOC) (Right) - CDE:12.72  Patient Location: Short Stay  Anesthesia Type:MAC  Level of Consciousness: awake, alert , oriented and patient cooperative  Airway & Oxygen Therapy: Patient Spontanous Breathing  Post-op Assessment: Report given to RN and Post -op Vital signs reviewed and stable  Post vital signs: Reviewed and stable  Last Vitals:  Filed Vitals:   02/07/15 0820  BP: 125/56  Temp:   Resp: 26    Complications: No apparent anesthesia complications

## 2015-02-07 NOTE — Discharge Instructions (Signed)

## 2015-02-07 NOTE — Anesthesia Preprocedure Evaluation (Signed)
Anesthesia Evaluation  Patient identified by MRN, date of birth, ID band  Reviewed: Allergy & Precautions, NPO status , Patient's Chart, lab work & pertinent test results, reviewed documented beta blocker date and time   Airway Mallampati: III  TM Distance: >3 FB     Dental  (+) Poor Dentition   Pulmonary neg pulmonary ROS,  breath sounds clear to auscultation        Cardiovascular hypertension, Pt. on medications and Pt. on home beta blockers Rhythm:Regular Rate:Normal     Neuro/Psych    GI/Hepatic negative GI ROS,   Endo/Other    Renal/GU Renal InsufficiencyRenal disease     Musculoskeletal  (+) Arthritis -,   Abdominal   Peds  Hematology   Anesthesia Other Findings   Reproductive/Obstetrics                             Anesthesia Physical Anesthesia Plan  ASA: III  Anesthesia Plan: MAC   Post-op Pain Management:    Induction: Intravenous  Airway Management Planned: Nasal Cannula  Additional Equipment:   Intra-op Plan:   Post-operative Plan:   Informed Consent: I have reviewed the patients History and Physical, chart, labs and discussed the procedure including the risks, benefits and alternatives for the proposed anesthesia with the patient or authorized representative who has indicated his/her understanding and acceptance.     Plan Discussed with:   Anesthesia Plan Comments:         Anesthesia Quick Evaluation

## 2015-02-07 NOTE — Anesthesia Postprocedure Evaluation (Signed)
  Anesthesia Post-op Note  Patient: Krista Payne  Procedure(s) Performed: Procedure(s) with comments: CATARACT EXTRACTION PHACO AND INTRAOCULAR LENS PLACEMENT (IOC) (Right) - CDE:12.72  Patient Location: Short Stay  Anesthesia Type:MAC  Level of Consciousness: awake, alert , oriented and patient cooperative  Airway and Oxygen Therapy: Patient Spontanous Breathing  Post-op Pain: none  Post-op Assessment: Post-op Vital signs reviewed, Patient's Cardiovascular Status Stable, Respiratory Function Stable, Patent Airway, No signs of Nausea or vomiting and Pain level controlled  Post-op Vital Signs: Reviewed and stable  Last Vitals:  Filed Vitals:   02/07/15 0820  BP: 125/56  Temp:   Resp: 26    Complications: No apparent anesthesia complications

## 2015-02-07 NOTE — Op Note (Signed)
Date of Admission: 02/07/2015  Date of Surgery: 02/07/2015   Pre-Op Dx: Cataract Right Eye  Post-Op Dx: Senile Combined Cataract Right  Eye,  Dx Code [id number]  Surgeon: Tonny Branch, M.D.  Assistants: None  Anesthesia: Topical with MAC  Indications: Painless, progressive loss of vision with compromise of daily activities.  Surgery: Cataract Extraction with Intraocular lens Implant Right Eye  Discription: The patient had dilating drops and viscous lidocaine placed into the Right eye in the pre-op holding area. After transfer to the operating room, a time out was performed. The patient was then prepped and draped. Beginning with a 10 degree blade a paracentesis port was made at the surgeon's 2 o'clock position. The anterior chamber was then filled with 1% non-preserved lidocaine. This was followed by filling the anterior chamber with Provisc.  A 2.43mm keratome blade was used to make a clear corneal incision at the temporal limbus.  A bent cystatome needle was used to create a continuous tear capsulotomy. Hydrodissection was performed with balanced salt solution on a Fine canula. The lens nucleus was then removed using the phacoemulsification handpiece. Residual cortex was removed with the I&A handpiece. The anterior chamber and capsular bag were refilled with Provisc. A posterior chamber intraocular lens was placed into the capsular bag with it's injector. The implant was positioned with the Kuglan hook. The Provisc was then removed from the anterior chamber and capsular bag with the I&A handpiece. Stromal hydration of the main incision and paracentesis port was performed with BSS on a Fine canula. The wounds were tested for leak which was negative. The patient tolerated the procedure well. There were no operative complications. The patient was then transferred to the recovery room in stable condition.  Complications: None  Specimen: None  EBL: None  Prosthetic device: Hoya iSert 250, power 17.5  D, SN [id number].

## 2015-02-07 NOTE — Anesthesia Procedure Notes (Signed)
Procedure Name: MAC Date/Time: 02/07/2015 8:22 AM Performed by: Andree Elk, AMY A Pre-anesthesia Checklist: Patient identified, Timeout performed, Emergency Drugs available, Suction available and Patient being monitored Oxygen Delivery Method: Nasal cannula

## 2015-02-08 ENCOUNTER — Encounter (HOSPITAL_COMMUNITY): Payer: Self-pay | Admitting: Ophthalmology

## 2015-02-14 DIAGNOSIS — H25812 Combined forms of age-related cataract, left eye: Secondary | ICD-10-CM | POA: Diagnosis not present

## 2015-02-14 DIAGNOSIS — Z961 Presence of intraocular lens: Secondary | ICD-10-CM | POA: Diagnosis not present

## 2015-02-14 DIAGNOSIS — H40013 Open angle with borderline findings, low risk, bilateral: Secondary | ICD-10-CM | POA: Diagnosis not present

## 2015-02-18 ENCOUNTER — Inpatient Hospital Stay (HOSPITAL_COMMUNITY)
Admission: RE | Admit: 2015-02-18 | Discharge: 2015-02-18 | Disposition: A | Discharge status: Home / Self Care | Payer: Medicare Other | Source: Ambulatory Visit | Attending: Ophthalmology | Admitting: Ophthalmology

## 2015-02-21 ENCOUNTER — Encounter (HOSPITAL_COMMUNITY): Payer: Self-pay

## 2015-02-21 ENCOUNTER — Encounter (HOSPITAL_COMMUNITY)
Admission: RE | Admit: 2015-02-21 | Discharge: 2015-02-21 | Disposition: A | Discharge status: Home / Self Care | Payer: Medicare Other | Source: Ambulatory Visit | Attending: Ophthalmology | Admitting: Ophthalmology

## 2015-02-21 ENCOUNTER — Encounter (HOSPITAL_COMMUNITY): Admission: RE | Admit: 2015-02-21 | Payer: Medicare Other | Source: Ambulatory Visit

## 2015-02-21 NOTE — Pre-Procedure Instructions (Signed)
Message left for a return call regarding completing PAT prior to procedure.

## 2015-02-23 MED ORDER — CYCLOPENTOLATE-PHENYLEPHRINE OP SOLN OPTIME - NO CHARGE
OPHTHALMIC | Status: AC
  Filled 2015-02-23: qty 2

## 2015-02-23 MED ORDER — LIDOCAINE HCL 3.5 % OP GEL
OPHTHALMIC | Status: AC
  Filled 2015-02-23: qty 1

## 2015-02-23 MED ORDER — PHENYLEPHRINE HCL 2.5 % OP SOLN
OPHTHALMIC | Status: AC
  Filled 2015-02-23: qty 15

## 2015-02-23 MED ORDER — TETRACAINE HCL 0.5 % OP SOLN
OPHTHALMIC | Status: AC
  Filled 2015-02-23: qty 2

## 2015-02-23 MED ORDER — NEOMYCIN-POLYMYXIN-DEXAMETH 3.5-10000-0.1 OP SUSP
OPHTHALMIC | Status: AC
  Filled 2015-02-23: qty 5

## 2015-02-23 MED ORDER — LIDOCAINE HCL (PF) 1 % IJ SOLN
INTRAMUSCULAR | Status: AC
  Filled 2015-02-23: qty 2

## 2015-02-24 ENCOUNTER — Ambulatory Visit (HOSPITAL_COMMUNITY): Payer: Medicare Other | Admitting: Anesthesiology

## 2015-02-24 ENCOUNTER — Encounter (HOSPITAL_COMMUNITY): Payer: Self-pay | Admitting: *Deleted

## 2015-02-24 ENCOUNTER — Ambulatory Visit (HOSPITAL_COMMUNITY)
Admission: RE | Admit: 2015-02-24 | Discharge: 2015-02-24 | Disposition: A | Discharge status: Home / Self Care | Payer: Medicare Other | Source: Ambulatory Visit | Attending: Ophthalmology | Admitting: Ophthalmology

## 2015-02-24 ENCOUNTER — Encounter (HOSPITAL_COMMUNITY)
Admission: RE | Disposition: A | Discharge status: Home / Self Care | Payer: Self-pay | Source: Ambulatory Visit | Attending: Ophthalmology

## 2015-02-24 DIAGNOSIS — H25812 Combined forms of age-related cataract, left eye: Secondary | ICD-10-CM | POA: Insufficient documentation

## 2015-02-24 DIAGNOSIS — I10 Essential (primary) hypertension: Secondary | ICD-10-CM | POA: Diagnosis not present

## 2015-02-24 DIAGNOSIS — M1990 Unspecified osteoarthritis, unspecified site: Secondary | ICD-10-CM | POA: Diagnosis not present

## 2015-02-24 DIAGNOSIS — N289 Disorder of kidney and ureter, unspecified: Secondary | ICD-10-CM | POA: Insufficient documentation

## 2015-02-24 DIAGNOSIS — Z79899 Other long term (current) drug therapy: Secondary | ICD-10-CM | POA: Insufficient documentation

## 2015-02-24 DIAGNOSIS — H259 Unspecified age-related cataract: Secondary | ICD-10-CM | POA: Diagnosis not present

## 2015-02-24 DIAGNOSIS — H269 Unspecified cataract: Secondary | ICD-10-CM | POA: Diagnosis present

## 2015-02-24 HISTORY — PX: CATARACT EXTRACTION W/PHACO: SHX586

## 2015-02-24 SURGERY — PHACOEMULSIFICATION, CATARACT, WITH IOL INSERTION
Anesthesia: Monitor Anesthesia Care | Site: Eye | Laterality: Left

## 2015-02-24 MED ORDER — BSS IO SOLN
INTRAOCULAR | Status: DC | PRN
  Administered 2015-02-24: 15 mL

## 2015-02-24 MED ORDER — EPINEPHRINE HCL 1 MG/ML IJ SOLN
INTRAMUSCULAR | Status: AC
  Filled 2015-02-24: qty 1

## 2015-02-24 MED ORDER — PROVISC 10 MG/ML IO SOLN
INTRAOCULAR | Status: DC | PRN
  Administered 2015-02-24: 0.85 mL via INTRAOCULAR

## 2015-02-24 MED ORDER — NEOMYCIN-POLYMYXIN-DEXAMETH 3.5-10000-0.1 OP SUSP
OPHTHALMIC | Status: DC | PRN
  Administered 2015-02-24: 2 [drp] via OPHTHALMIC

## 2015-02-24 MED ORDER — LIDOCAINE HCL 3.5 % OP GEL
1.0000 "application " | Freq: Once | OPHTHALMIC | Status: AC
  Administered 2015-02-24: 1 via OPHTHALMIC

## 2015-02-24 MED ORDER — FENTANYL CITRATE (PF) 100 MCG/2ML IJ SOLN
25.0000 ug | INTRAMUSCULAR | Status: AC
  Administered 2015-02-24: 25 ug via INTRAVENOUS

## 2015-02-24 MED ORDER — POVIDONE-IODINE 5 % OP SOLN
OPHTHALMIC | Status: DC | PRN
  Administered 2015-02-24: 1 via OPHTHALMIC

## 2015-02-24 MED ORDER — CYCLOPENTOLATE-PHENYLEPHRINE 0.2-1 % OP SOLN
1.0000 [drp] | OPHTHALMIC | Status: AC
  Administered 2015-02-24 (×3): 1 [drp] via OPHTHALMIC

## 2015-02-24 MED ORDER — MIDAZOLAM HCL 2 MG/2ML IJ SOLN
INTRAMUSCULAR | Status: AC
  Filled 2015-02-24: qty 2

## 2015-02-24 MED ORDER — LACTATED RINGERS IV SOLN
INTRAVENOUS | Status: DC
  Administered 2015-02-24: 09:00:00 via INTRAVENOUS

## 2015-02-24 MED ORDER — PHENYLEPHRINE HCL 2.5 % OP SOLN
1.0000 [drp] | OPHTHALMIC | Status: AC
  Administered 2015-02-24 (×3): 1 [drp] via OPHTHALMIC

## 2015-02-24 MED ORDER — LIDOCAINE HCL (PF) 1 % IJ SOLN
INTRAMUSCULAR | Status: DC | PRN
  Administered 2015-02-24: .5 mL

## 2015-02-24 MED ORDER — FENTANYL CITRATE (PF) 100 MCG/2ML IJ SOLN
INTRAMUSCULAR | Status: AC
  Filled 2015-02-24: qty 2

## 2015-02-24 MED ORDER — MIDAZOLAM HCL 2 MG/2ML IJ SOLN
1.0000 mg | INTRAMUSCULAR | Status: DC | PRN
  Administered 2015-02-24: 2 mg via INTRAVENOUS

## 2015-02-24 MED ORDER — TETRACAINE HCL 0.5 % OP SOLN
1.0000 [drp] | OPHTHALMIC | Status: AC
  Administered 2015-02-24 (×3): 1 [drp] via OPHTHALMIC

## 2015-02-24 MED ORDER — BSS IO SOLN
INTRAOCULAR | Status: DC | PRN
  Administered 2015-02-24: 500 mL

## 2015-02-24 SURGICAL SUPPLY — 33 items
CAPSULAR TENSION RING-AMO (OPHTHALMIC RELATED) IMPLANT
CLOTH BEACON ORANGE TIMEOUT ST (SAFETY) ×2 IMPLANT
EYE SHIELD UNIVERSAL CLEAR (GAUZE/BANDAGES/DRESSINGS) ×2 IMPLANT
GLOVE BIO SURGEON STRL SZ 6.5 (GLOVE) IMPLANT
GLOVE BIOGEL PI IND STRL 6.5 (GLOVE) ×1 IMPLANT
GLOVE BIOGEL PI IND STRL 7.0 (GLOVE) IMPLANT
GLOVE BIOGEL PI IND STRL 7.5 (GLOVE) IMPLANT
GLOVE BIOGEL PI INDICATOR 6.5 (GLOVE) ×1
GLOVE BIOGEL PI INDICATOR 7.0 (GLOVE)
GLOVE BIOGEL PI INDICATOR 7.5 (GLOVE)
GLOVE ECLIPSE 6.5 STRL STRAW (GLOVE) IMPLANT
GLOVE ECLIPSE 7.0 STRL STRAW (GLOVE) IMPLANT
GLOVE ECLIPSE 7.5 STRL STRAW (GLOVE) IMPLANT
GLOVE EXAM NITRILE LRG STRL (GLOVE) ×2 IMPLANT
GLOVE EXAM NITRILE MD LF STRL (GLOVE) IMPLANT
GLOVE SKINSENSE NS SZ6.5 (GLOVE)
GLOVE SKINSENSE NS SZ7.0 (GLOVE)
GLOVE SKINSENSE STRL SZ6.5 (GLOVE) IMPLANT
GLOVE SKINSENSE STRL SZ7.0 (GLOVE) IMPLANT
KIT VITRECTOMY (OPHTHALMIC RELATED) IMPLANT
PAD ARMBOARD 7.5X6 YLW CONV (MISCELLANEOUS) ×2 IMPLANT
PROC W NO LENS (INTRAOCULAR LENS)
PROC W SPEC LENS (INTRAOCULAR LENS)
PROCESS W NO LENS (INTRAOCULAR LENS) IMPLANT
PROCESS W SPEC LENS (INTRAOCULAR LENS) IMPLANT
RETRACTOR IRIS SIGHTPATH (OPHTHALMIC RELATED) IMPLANT
RING MALYGIN (MISCELLANEOUS) IMPLANT
SIGHTPATH CAT PROC W REG LENS (Ophthalmic Related) ×2 IMPLANT
SYRINGE LUER LOK 1CC (MISCELLANEOUS) ×2 IMPLANT
TAPE SURG TRANSPORE 1 IN (GAUZE/BANDAGES/DRESSINGS) ×1 IMPLANT
TAPE SURGICAL TRANSPORE 1 IN (GAUZE/BANDAGES/DRESSINGS) ×1
VISCOELASTIC ADDITIONAL (OPHTHALMIC RELATED) IMPLANT
WATER STERILE IRR 250ML POUR (IV SOLUTION) ×2 IMPLANT

## 2015-02-24 NOTE — Anesthesia Postprocedure Evaluation (Signed)
  Anesthesia Post-op Note  Patient: Krista Payne  Procedure(s) Performed: Procedure(s) with comments: CATARACT EXTRACTION PHACO AND INTRAOCULAR LENS PLACEMENT LEFT EYE (Left) - CDE 11.69  Patient Location: Short Stay  Anesthesia Type:MAC  Level of Consciousness: awake, alert , oriented and patient cooperative  Airway and Oxygen Therapy: Patient Spontanous Breathing  Post-op Pain: none  Post-op Assessment: Post-op Vital signs reviewed, Patient's Cardiovascular Status Stable, Respiratory Function Stable, Patent Airway and Pain level controlled  Post-op Vital Signs: Reviewed and stable  Last Vitals:  Filed Vitals:   02/24/15 0930  BP: 178/72  Temp:   Resp: 22    Complications: No apparent anesthesia complications

## 2015-02-24 NOTE — Anesthesia Preprocedure Evaluation (Addendum)
Anesthesia Evaluation  Patient identified by MRN, date of birth, ID band  Reviewed: Allergy & Precautions, NPO status , Patient's Chart, lab work & pertinent test results, reviewed documented beta blocker date and time   Airway Mallampati: III  TM Distance: >3 FB     Dental  (+) Poor Dentition   Pulmonary neg pulmonary ROS,  breath sounds clear to auscultation        Cardiovascular hypertension, Pt. on medications and Pt. on home beta blockers Rhythm:Regular Rate:Normal     Neuro/Psych    GI/Hepatic negative GI ROS,   Endo/Other    Renal/GU Renal InsufficiencyRenal disease     Musculoskeletal  (+) Arthritis -,   Abdominal   Peds  Hematology   Anesthesia Other Findings   Reproductive/Obstetrics                            Anesthesia Physical Anesthesia Plan  ASA: III  Anesthesia Plan: MAC   Post-op Pain Management:    Induction: Intravenous  Airway Management Planned: Nasal Cannula  Additional Equipment:   Intra-op Plan:   Post-operative Plan:   Informed Consent: I have reviewed the patients History and Physical, chart, labs and discussed the procedure including the risks, benefits and alternatives for the proposed anesthesia with the patient or authorized representative who has indicated his/her understanding and acceptance.     Plan Discussed with:   Anesthesia Plan Comments:         Anesthesia Quick Evaluation

## 2015-02-24 NOTE — Op Note (Signed)
Date of Admission: 02/24/2015  Date of Surgery: 02/24/2015   Pre-Op Dx: Cataract Left Eye  Post-Op Dx: Senile Combined Cataract Left  Eye,  Dx Code [id number]  Surgeon: Tonny Branch, M.D.  Assistants: None  Anesthesia: Topical with MAC  Indications: Painless, progressive loss of vision with compromise of daily activities.  Surgery: Cataract Extraction with Intraocular lens Implant Left Eye  Discription: The patient had dilating drops and viscous lidocaine placed into the Left eye in the pre-op holding area. After transfer to the operating room, a time out was performed. The patient was then prepped and draped. Beginning with a 62 degree blade a paracentesis port was made at the surgeon's 2 o'clock position. The anterior chamber was then filled with 1% non-preserved lidocaine. This was followed by filling the anterior chamber with Provisc.  A 2.20mm keratome blade was used to make a clear corneal incision at the temporal limbus.  A bent cystatome needle was used to create a continuous tear capsulotomy. Hydrodissection was performed with balanced salt solution on a Fine canula. The lens nucleus was then removed using the phacoemulsification handpiece. Residual cortex was removed with the I&A handpiece. The anterior chamber and capsular bag were refilled with Provisc. A posterior chamber intraocular lens was placed into the capsular bag with it's injector. The implant was positioned with the Kuglan hook. The Provisc was then removed from the anterior chamber and capsular bag with the I&A handpiece. Stromal hydration of the main incision and paracentesis port was performed with BSS on a Fine canula. The wounds were tested for leak which was negative. The patient tolerated the procedure well. There were no operative complications. The patient was then transferred to the recovery room in stable condition.  Complications: None  Specimen: None  EBL: None  Prosthetic device: Hoya iSert 250, power 18.5 D, SN  [id number].

## 2015-02-24 NOTE — Transfer of Care (Signed)
Immediate Anesthesia Transfer of Care Note  Patient: Krista Payne  Procedure(s) Performed: Procedure(s) with comments: CATARACT EXTRACTION PHACO AND INTRAOCULAR LENS PLACEMENT LEFT EYE (Left) - CDE 11.69  Patient Location: Short Stay  Anesthesia Type:MAC  Level of Consciousness: awake, alert , oriented and patient cooperative  Airway & Oxygen Therapy: Patient Spontanous Breathing  Post-op Assessment: Report given to RN and Post -op Vital signs reviewed and stable  Post vital signs: Reviewed and stable  Last Vitals:  Filed Vitals:   02/24/15 0930  BP: 178/72  Temp:   Resp: 22    Complications: No apparent anesthesia complications

## 2015-02-24 NOTE — Discharge Instructions (Signed)

## 2015-02-24 NOTE — H&P (Signed)
I have reviewed the H&P, the patient was re-examined, and I have identified no interval changes in medical condition and plan of care since the history and physical of record  

## 2015-02-25 ENCOUNTER — Encounter (HOSPITAL_COMMUNITY): Payer: Self-pay | Admitting: Ophthalmology

## 2015-03-11 DIAGNOSIS — H25811 Combined forms of age-related cataract, right eye: Secondary | ICD-10-CM | POA: Diagnosis not present

## 2015-04-21 ENCOUNTER — Other Ambulatory Visit: Payer: Self-pay

## 2015-04-21 DIAGNOSIS — Z1231 Encounter for screening mammogram for malignant neoplasm of breast: Secondary | ICD-10-CM

## 2015-04-25 DIAGNOSIS — I10 Essential (primary) hypertension: Secondary | ICD-10-CM | POA: Diagnosis not present

## 2015-04-25 DIAGNOSIS — E1165 Type 2 diabetes mellitus with hyperglycemia: Secondary | ICD-10-CM | POA: Diagnosis not present

## 2015-04-25 DIAGNOSIS — E782 Mixed hyperlipidemia: Secondary | ICD-10-CM | POA: Diagnosis not present

## 2015-04-27 DIAGNOSIS — I10 Essential (primary) hypertension: Secondary | ICD-10-CM | POA: Diagnosis not present

## 2015-04-27 DIAGNOSIS — D649 Anemia, unspecified: Secondary | ICD-10-CM | POA: Diagnosis not present

## 2015-04-27 DIAGNOSIS — R944 Abnormal results of kidney function studies: Secondary | ICD-10-CM | POA: Diagnosis not present

## 2015-04-27 DIAGNOSIS — R609 Edema, unspecified: Secondary | ICD-10-CM | POA: Diagnosis not present

## 2015-04-27 DIAGNOSIS — E119 Type 2 diabetes mellitus without complications: Secondary | ICD-10-CM | POA: Diagnosis not present

## 2015-04-27 DIAGNOSIS — F419 Anxiety disorder, unspecified: Secondary | ICD-10-CM | POA: Diagnosis not present

## 2015-04-27 DIAGNOSIS — J309 Allergic rhinitis, unspecified: Secondary | ICD-10-CM | POA: Diagnosis not present

## 2015-05-19 ENCOUNTER — Ambulatory Visit
Admission: RE | Admit: 2015-05-19 | Discharge: 2015-05-19 | Disposition: A | Discharge status: Home / Self Care | Payer: Medicare Other | Source: Ambulatory Visit

## 2015-05-19 DIAGNOSIS — Z1231 Encounter for screening mammogram for malignant neoplasm of breast: Secondary | ICD-10-CM

## 2015-05-19 IMAGING — MG MM SCREEN MAMMOGRAM BILATERAL
4 series · 4 of 4 positions shown · non-contrast
Comparison: Previous exam(s).

CLINICAL DATA: Screening.

EXAM:
DIGITAL SCREENING BILATERAL MAMMOGRAM WITH CAD

[R CC]
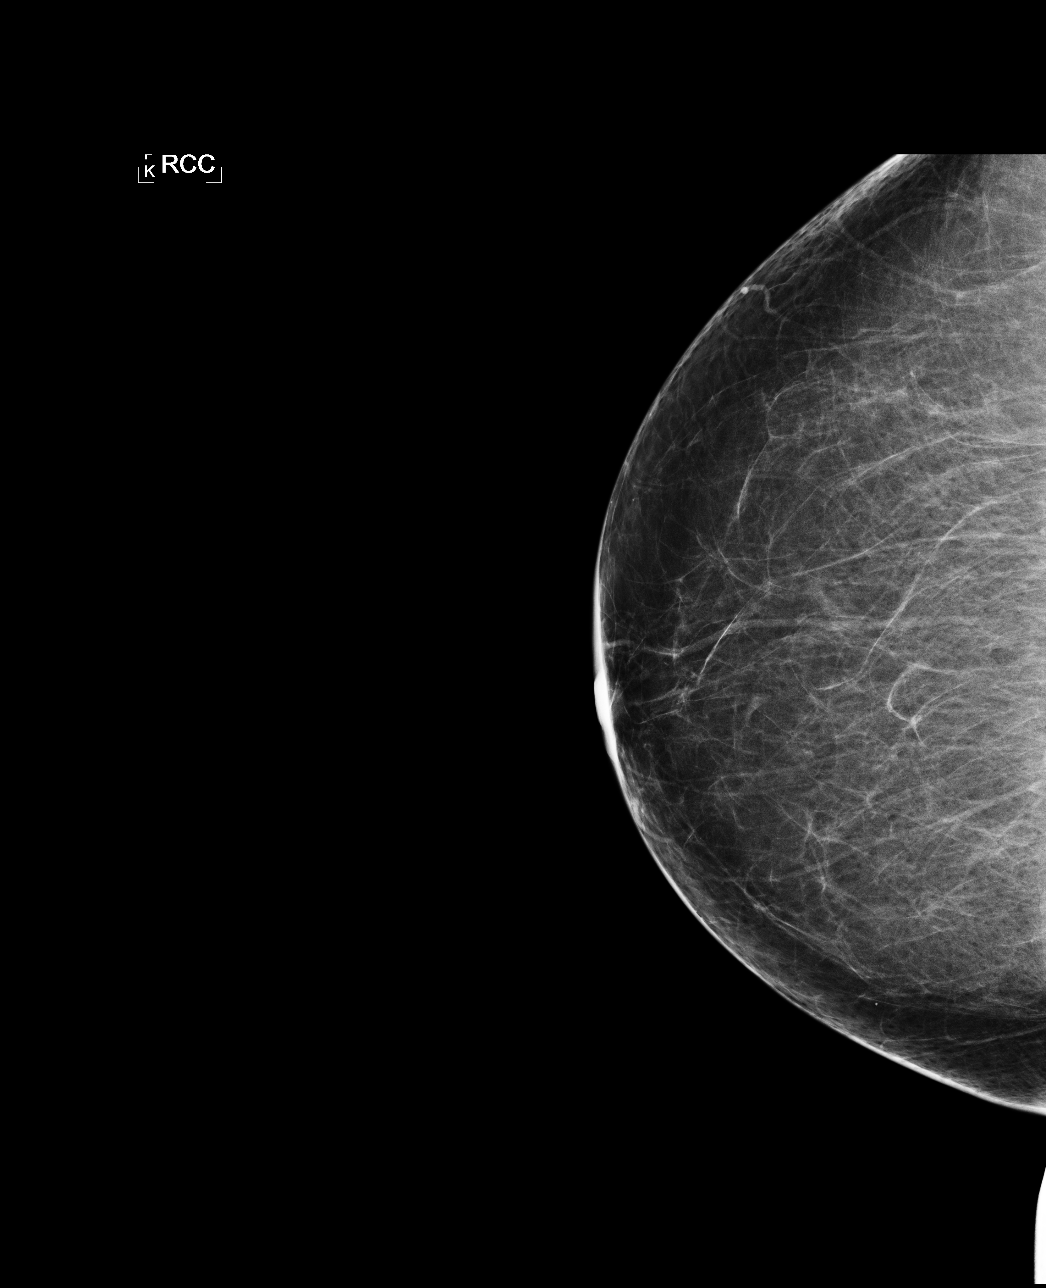

[L CC]
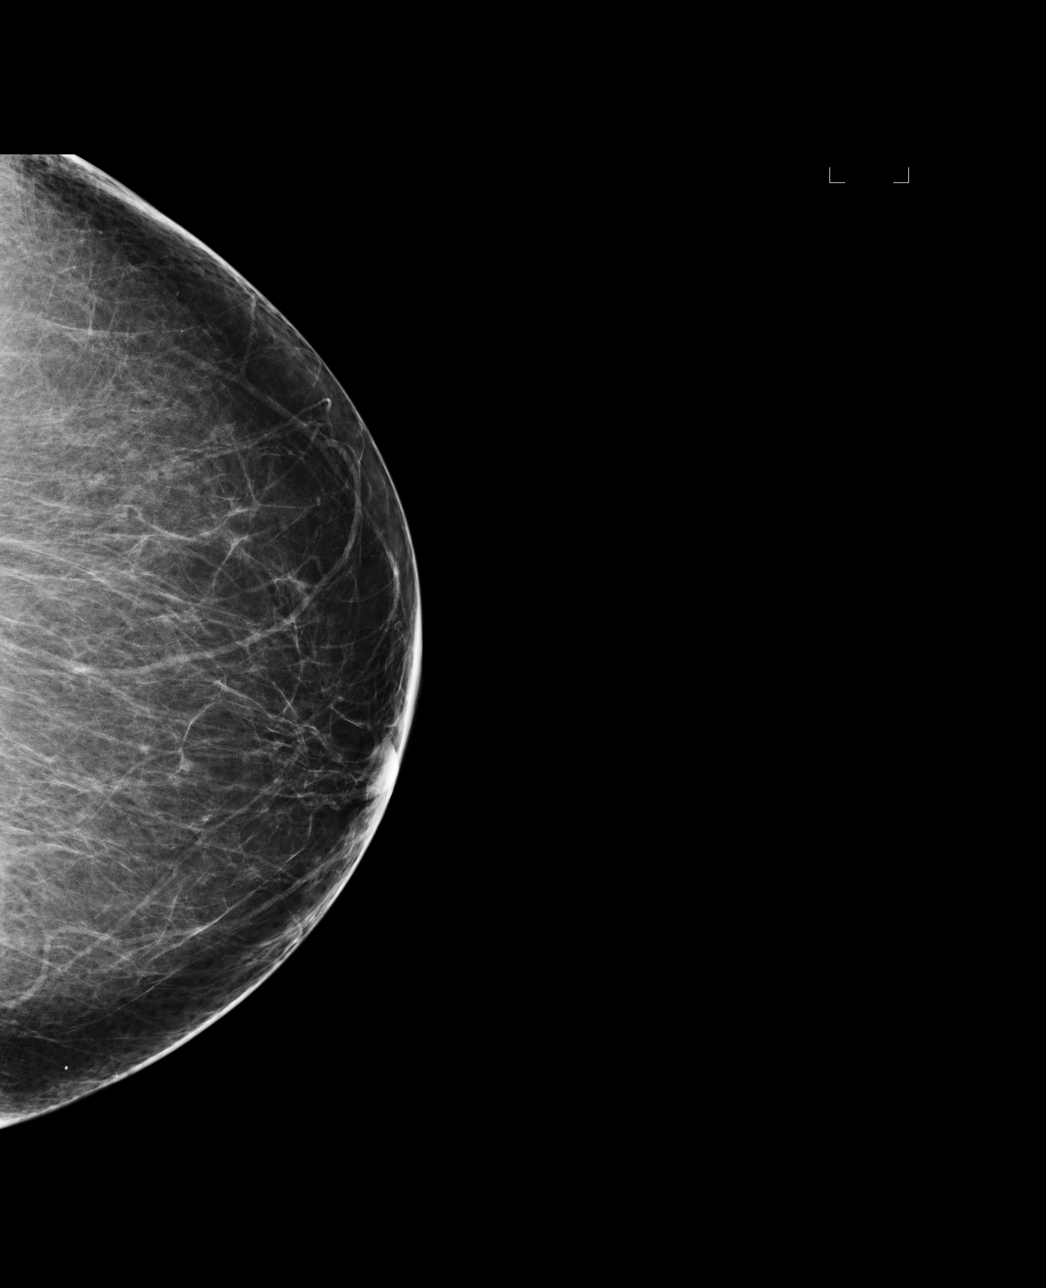

[L MLO]
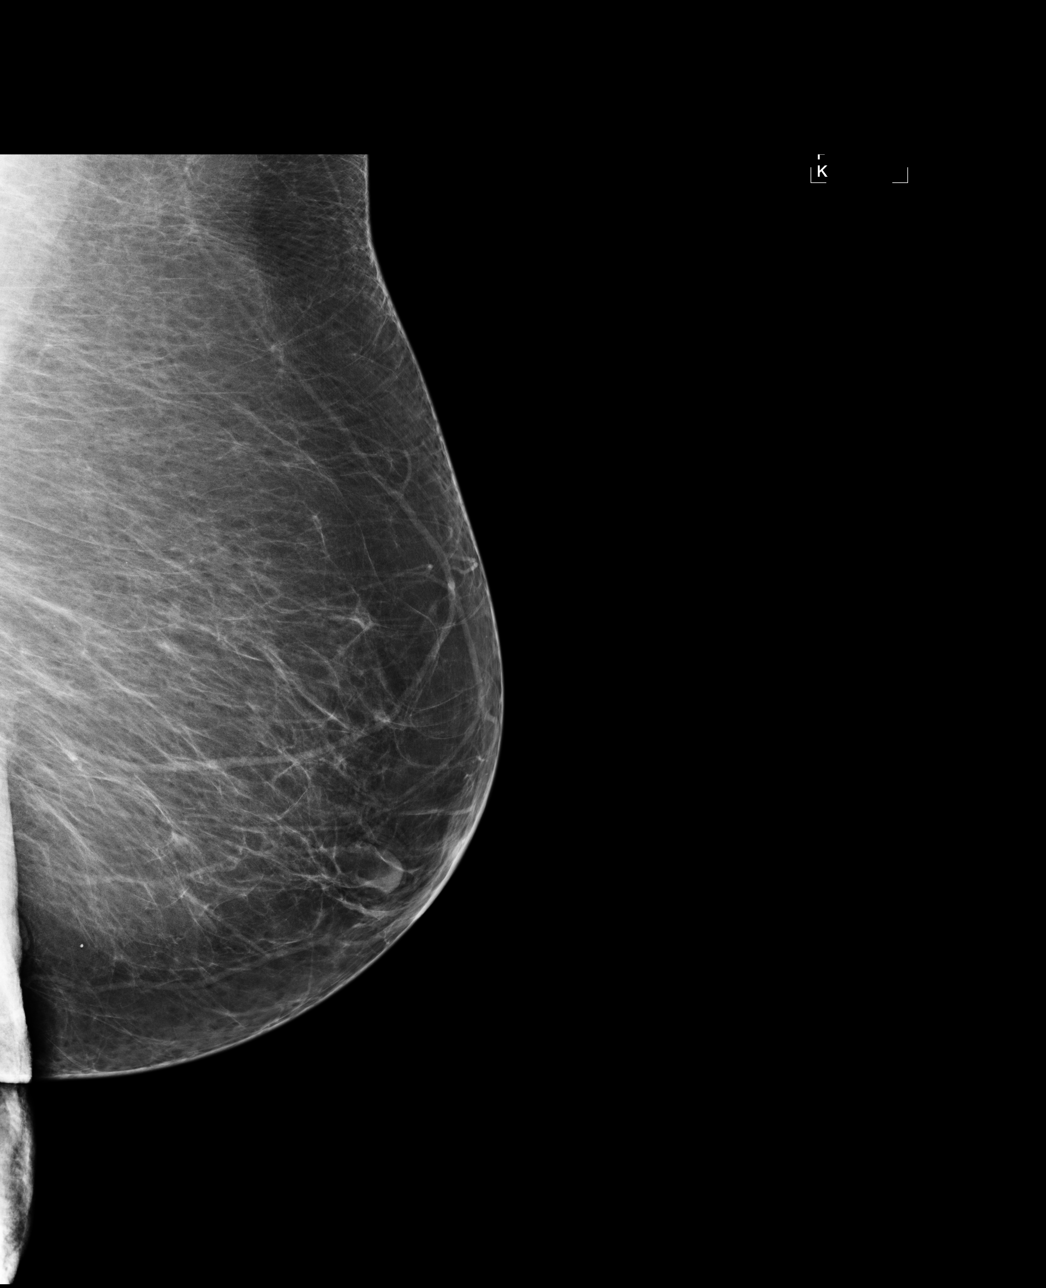

[R MLO]
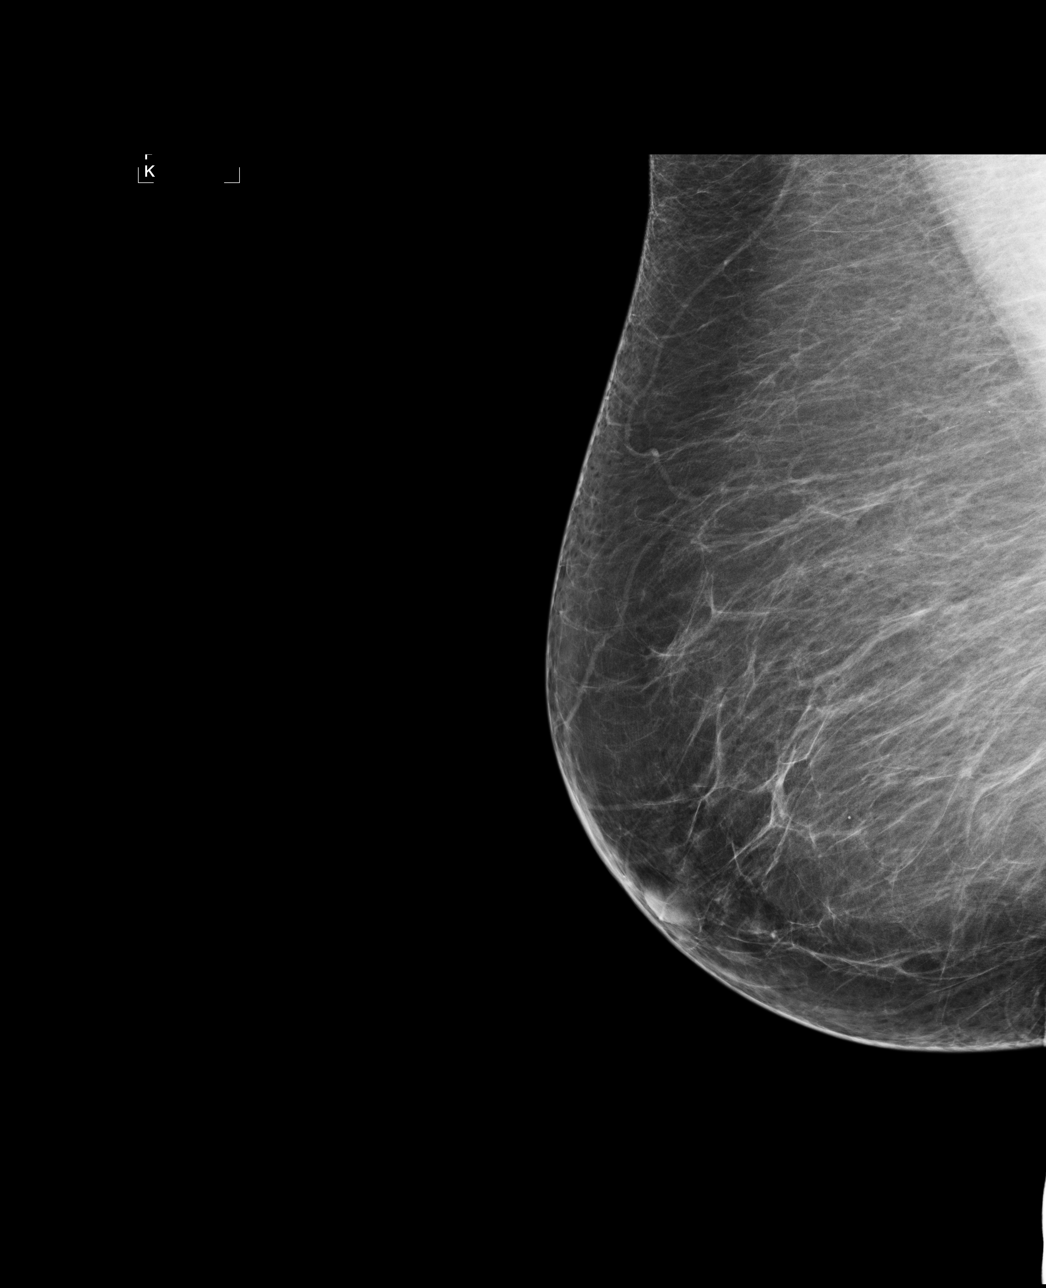

[4 of 4 positions shown; findings below may reference images not displayed]

ACR Breast Density Category b: There are scattered areas of
fibroglandular density.
FINDINGS: There are no findings suspicious for malignancy. Images were
processed with CAD.
IMPRESSION: No mammographic evidence of malignancy. A result letter of this
screening mammogram will be mailed directly to the patient.

RECOMMENDATION:
Screening mammogram in one year. (Code:[US])

BI-RADS CATEGORY  1: Negative.

## 2015-05-31 ENCOUNTER — Encounter: Payer: Self-pay | Admitting: Gastroenterology

## 2015-06-10 DIAGNOSIS — E119 Type 2 diabetes mellitus without complications: Secondary | ICD-10-CM | POA: Diagnosis not present

## 2015-06-10 DIAGNOSIS — D649 Anemia, unspecified: Secondary | ICD-10-CM | POA: Diagnosis not present

## 2015-07-12 DIAGNOSIS — R609 Edema, unspecified: Secondary | ICD-10-CM | POA: Diagnosis not present

## 2015-07-12 DIAGNOSIS — I10 Essential (primary) hypertension: Secondary | ICD-10-CM | POA: Diagnosis not present

## 2015-07-12 DIAGNOSIS — D649 Anemia, unspecified: Secondary | ICD-10-CM | POA: Diagnosis not present

## 2015-07-12 DIAGNOSIS — R944 Abnormal results of kidney function studies: Secondary | ICD-10-CM | POA: Diagnosis not present

## 2015-07-12 DIAGNOSIS — J309 Allergic rhinitis, unspecified: Secondary | ICD-10-CM | POA: Diagnosis not present

## 2015-07-12 DIAGNOSIS — F419 Anxiety disorder, unspecified: Secondary | ICD-10-CM | POA: Diagnosis not present

## 2015-07-12 DIAGNOSIS — E119 Type 2 diabetes mellitus without complications: Secondary | ICD-10-CM | POA: Diagnosis not present

## 2015-08-12 DIAGNOSIS — R944 Abnormal results of kidney function studies: Secondary | ICD-10-CM | POA: Diagnosis not present

## 2015-08-12 DIAGNOSIS — Z23 Encounter for immunization: Secondary | ICD-10-CM | POA: Diagnosis not present

## 2015-11-30 DIAGNOSIS — I10 Essential (primary) hypertension: Secondary | ICD-10-CM | POA: Diagnosis not present

## 2015-11-30 DIAGNOSIS — E119 Type 2 diabetes mellitus without complications: Secondary | ICD-10-CM | POA: Diagnosis not present

## 2015-12-02 DIAGNOSIS — E782 Mixed hyperlipidemia: Secondary | ICD-10-CM | POA: Diagnosis not present

## 2015-12-02 DIAGNOSIS — E1129 Type 2 diabetes mellitus with other diabetic kidney complication: Secondary | ICD-10-CM | POA: Diagnosis not present

## 2015-12-02 DIAGNOSIS — I10 Essential (primary) hypertension: Secondary | ICD-10-CM | POA: Diagnosis not present

## 2015-12-02 DIAGNOSIS — R944 Abnormal results of kidney function studies: Secondary | ICD-10-CM | POA: Diagnosis not present

## 2016-04-03 DIAGNOSIS — H401132 Primary open-angle glaucoma, bilateral, moderate stage: Secondary | ICD-10-CM | POA: Diagnosis not present

## 2016-04-03 DIAGNOSIS — H5034 Intermittent alternating exotropia: Secondary | ICD-10-CM | POA: Diagnosis not present

## 2016-04-03 DIAGNOSIS — H5015 Alternating exotropia: Secondary | ICD-10-CM | POA: Diagnosis not present

## 2016-04-09 DIAGNOSIS — E782 Mixed hyperlipidemia: Secondary | ICD-10-CM | POA: Diagnosis not present

## 2016-04-09 DIAGNOSIS — E119 Type 2 diabetes mellitus without complications: Secondary | ICD-10-CM | POA: Diagnosis not present

## 2016-04-09 DIAGNOSIS — D509 Iron deficiency anemia, unspecified: Secondary | ICD-10-CM | POA: Diagnosis not present

## 2016-05-21 DIAGNOSIS — H40013 Open angle with borderline findings, low risk, bilateral: Secondary | ICD-10-CM | POA: Diagnosis not present

## 2016-05-21 DIAGNOSIS — H26493 Other secondary cataract, bilateral: Secondary | ICD-10-CM | POA: Diagnosis not present

## 2016-05-21 DIAGNOSIS — H35363 Drusen (degenerative) of macula, bilateral: Secondary | ICD-10-CM | POA: Diagnosis not present

## 2016-06-12 ENCOUNTER — Other Ambulatory Visit: Payer: Self-pay | Admitting: Internal Medicine

## 2016-06-12 DIAGNOSIS — Z1231 Encounter for screening mammogram for malignant neoplasm of breast: Secondary | ICD-10-CM

## 2016-06-19 DIAGNOSIS — H26491 Other secondary cataract, right eye: Secondary | ICD-10-CM | POA: Diagnosis not present

## 2016-06-26 ENCOUNTER — Ambulatory Visit
Admission: RE | Admit: 2016-06-26 | Discharge: 2016-06-26 | Disposition: A | Discharge status: Home / Self Care | Payer: Medicare Other | Source: Ambulatory Visit | Attending: Internal Medicine | Admitting: Internal Medicine

## 2016-06-26 DIAGNOSIS — Z1231 Encounter for screening mammogram for malignant neoplasm of breast: Secondary | ICD-10-CM | POA: Diagnosis not present

## 2016-06-26 IMAGING — MG DIGITAL SCREENING BILATERAL MAMMOGRAM WITH CAD
5 series · 5 of 5 positions shown · non-contrast
Comparison: Previous exam(s).

ACR Breast Density Category a: The breast tissue is almost entirely
fatty.

CLINICAL DATA: Screening.

EXAM:
DIGITAL SCREENING BILATERAL MAMMOGRAM WITH CAD

[R CC]
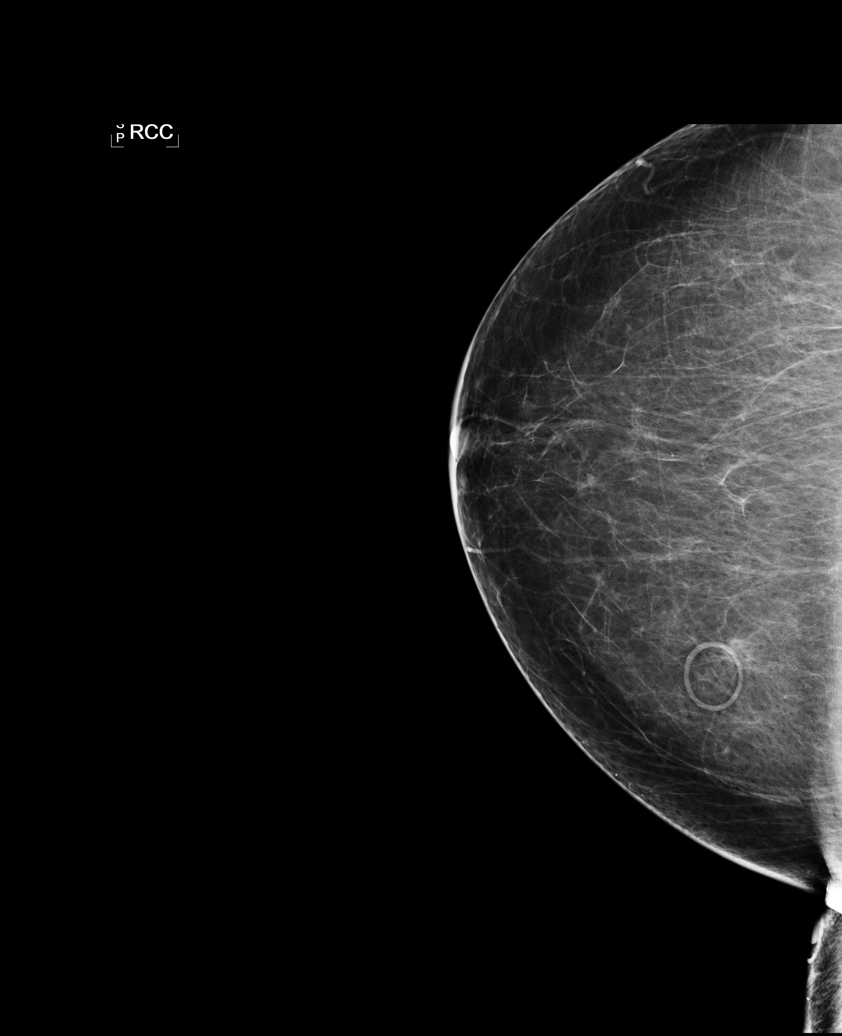

[L CC (1 of 2)]
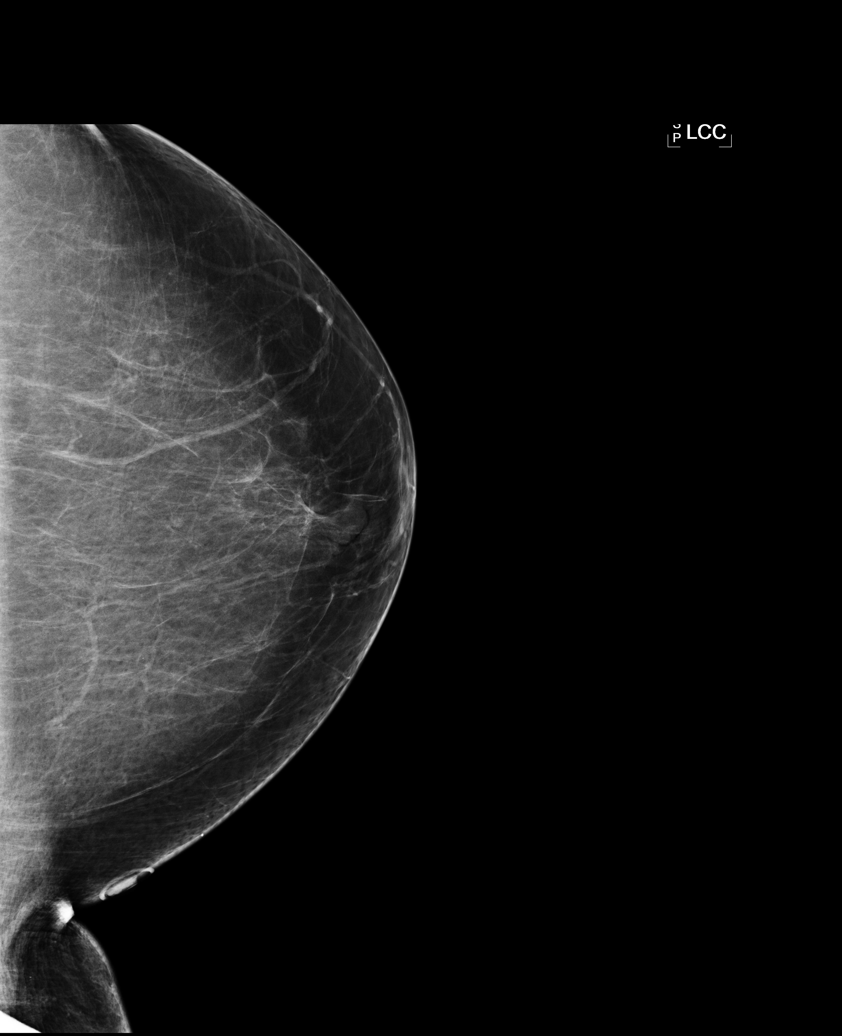

[L MLO]
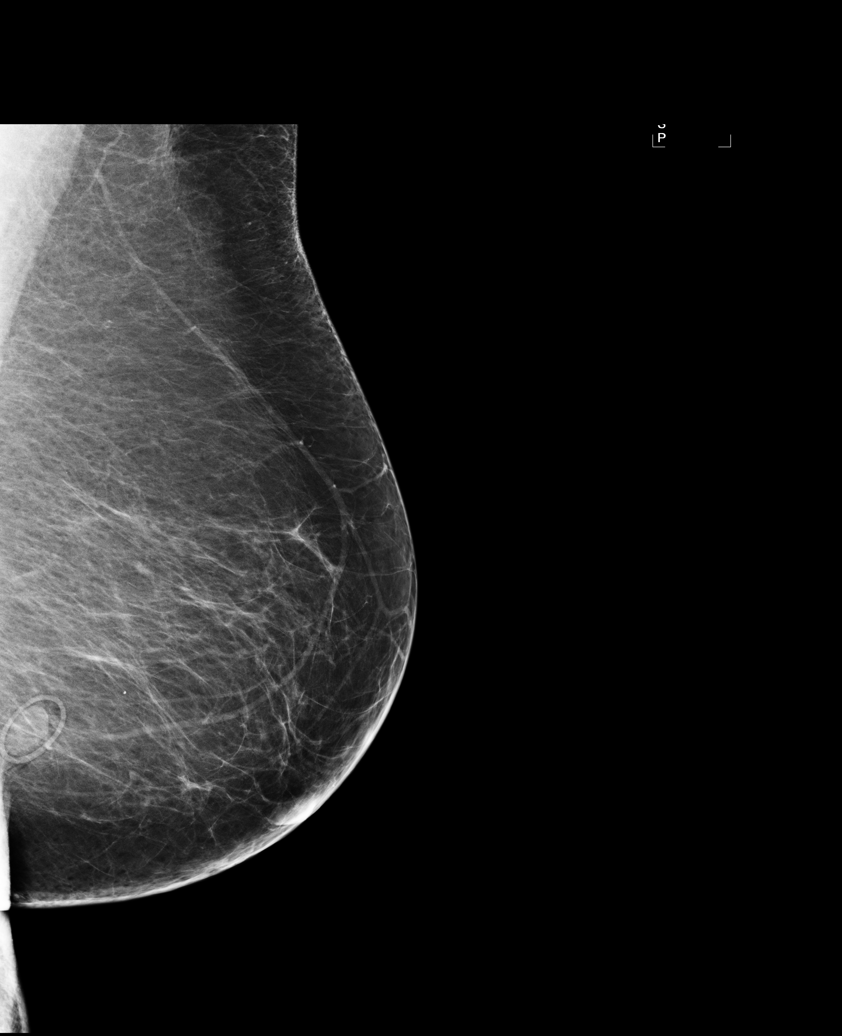

[R MLO]
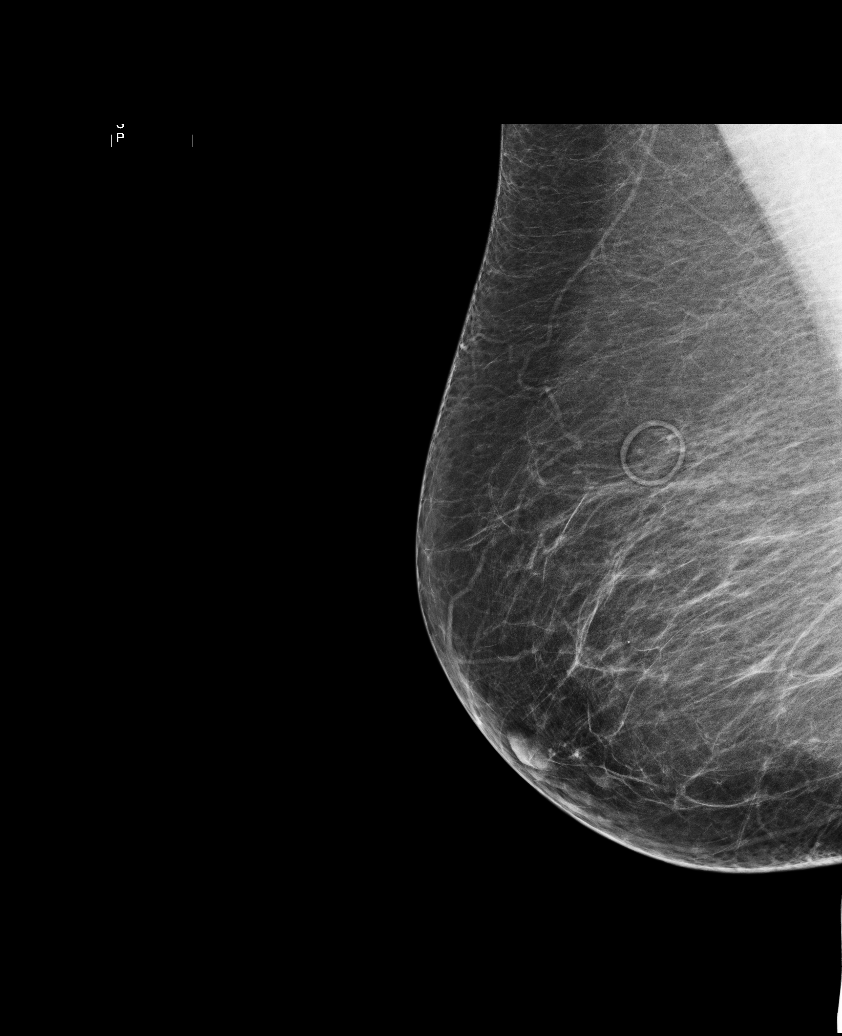

[L CC (2 of 2)]
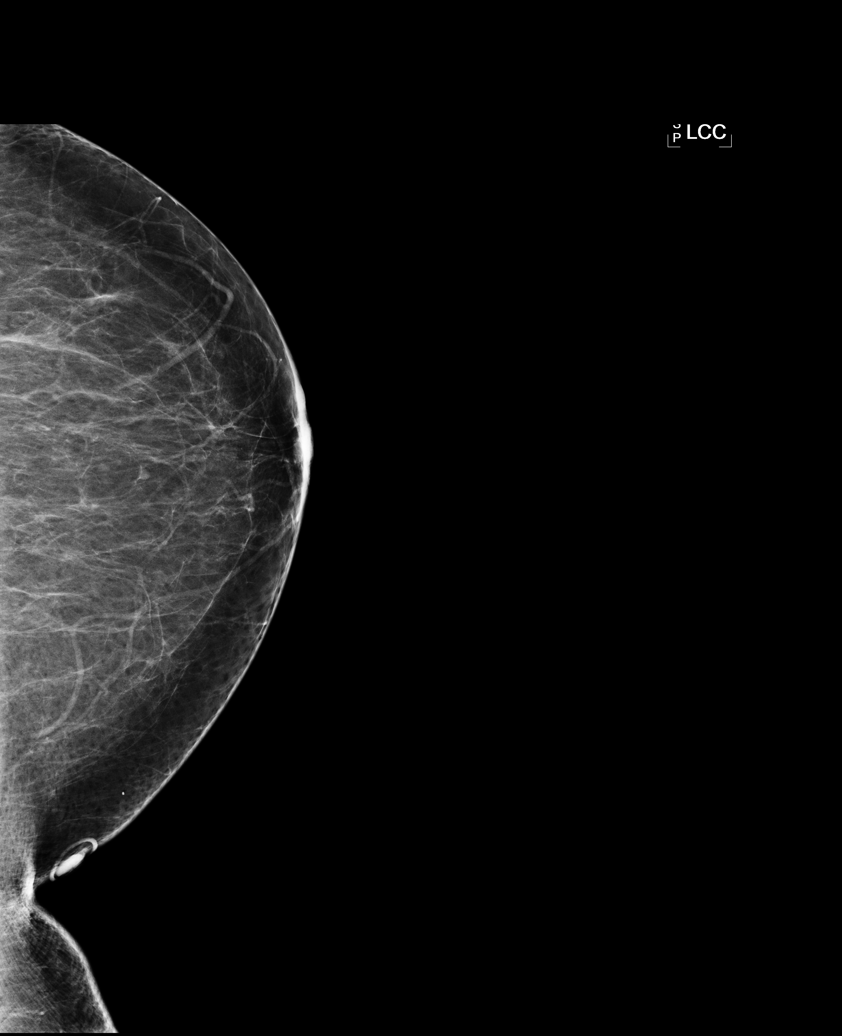

[5 of 5 positions shown; findings below may reference images not displayed]

FINDINGS: There are no findings suspicious for malignancy. Images were
processed with CAD.
IMPRESSION: No mammographic evidence of malignancy. A result letter of this
screening mammogram will be mailed directly to the patient.

RECOMMENDATION:
Screening mammogram in one year. (Code:[0V])

BI-RADS CATEGORY  1: Negative.

## 2016-08-01 DIAGNOSIS — E782 Mixed hyperlipidemia: Secondary | ICD-10-CM | POA: Diagnosis not present

## 2016-08-01 DIAGNOSIS — I10 Essential (primary) hypertension: Secondary | ICD-10-CM | POA: Diagnosis not present

## 2016-08-01 DIAGNOSIS — D509 Iron deficiency anemia, unspecified: Secondary | ICD-10-CM | POA: Diagnosis not present

## 2016-08-01 DIAGNOSIS — D518 Other vitamin B12 deficiency anemias: Secondary | ICD-10-CM | POA: Diagnosis not present

## 2016-08-01 DIAGNOSIS — R7301 Impaired fasting glucose: Secondary | ICD-10-CM | POA: Diagnosis not present

## 2016-08-01 DIAGNOSIS — K700 Alcoholic fatty liver: Secondary | ICD-10-CM | POA: Diagnosis not present

## 2016-08-01 DIAGNOSIS — E119 Type 2 diabetes mellitus without complications: Secondary | ICD-10-CM | POA: Diagnosis not present

## 2016-08-01 DIAGNOSIS — I482 Chronic atrial fibrillation: Secondary | ICD-10-CM | POA: Diagnosis not present

## 2016-08-03 DIAGNOSIS — E119 Type 2 diabetes mellitus without complications: Secondary | ICD-10-CM | POA: Diagnosis not present

## 2016-08-03 DIAGNOSIS — F411 Generalized anxiety disorder: Secondary | ICD-10-CM | POA: Diagnosis not present

## 2016-08-03 DIAGNOSIS — I10 Essential (primary) hypertension: Secondary | ICD-10-CM | POA: Diagnosis not present

## 2016-08-03 DIAGNOSIS — E782 Mixed hyperlipidemia: Secondary | ICD-10-CM | POA: Diagnosis not present

## 2016-08-03 DIAGNOSIS — Z23 Encounter for immunization: Secondary | ICD-10-CM | POA: Diagnosis not present

## 2016-08-03 DIAGNOSIS — Z6839 Body mass index (BMI) 39.0-39.9, adult: Secondary | ICD-10-CM | POA: Diagnosis not present

## 2016-08-03 DIAGNOSIS — Z0001 Encounter for general adult medical examination with abnormal findings: Secondary | ICD-10-CM | POA: Diagnosis not present

## 2016-08-03 DIAGNOSIS — R601 Generalized edema: Secondary | ICD-10-CM | POA: Diagnosis not present

## 2016-08-03 DIAGNOSIS — J309 Allergic rhinitis, unspecified: Secondary | ICD-10-CM | POA: Diagnosis not present

## 2016-08-03 DIAGNOSIS — D509 Iron deficiency anemia, unspecified: Secondary | ICD-10-CM | POA: Diagnosis not present

## 2016-08-03 DIAGNOSIS — R944 Abnormal results of kidney function studies: Secondary | ICD-10-CM | POA: Diagnosis not present

## 2016-08-08 ENCOUNTER — Other Ambulatory Visit (HOSPITAL_COMMUNITY): Payer: Self-pay | Admitting: Internal Medicine

## 2016-08-31 DIAGNOSIS — F411 Generalized anxiety disorder: Secondary | ICD-10-CM | POA: Diagnosis not present

## 2016-08-31 DIAGNOSIS — F4321 Adjustment disorder with depressed mood: Secondary | ICD-10-CM | POA: Diagnosis not present

## 2016-08-31 DIAGNOSIS — I10 Essential (primary) hypertension: Secondary | ICD-10-CM | POA: Diagnosis not present

## 2016-08-31 DIAGNOSIS — Z6839 Body mass index (BMI) 39.0-39.9, adult: Secondary | ICD-10-CM | POA: Diagnosis not present

## 2016-12-19 DIAGNOSIS — E782 Mixed hyperlipidemia: Secondary | ICD-10-CM | POA: Diagnosis not present

## 2016-12-19 DIAGNOSIS — E119 Type 2 diabetes mellitus without complications: Secondary | ICD-10-CM | POA: Diagnosis not present

## 2016-12-21 ENCOUNTER — Other Ambulatory Visit (HOSPITAL_COMMUNITY): Payer: Self-pay | Admitting: Internal Medicine

## 2016-12-21 DIAGNOSIS — M159 Polyosteoarthritis, unspecified: Secondary | ICD-10-CM

## 2016-12-21 DIAGNOSIS — I10 Essential (primary) hypertension: Secondary | ICD-10-CM | POA: Diagnosis not present

## 2016-12-21 DIAGNOSIS — D509 Iron deficiency anemia, unspecified: Secondary | ICD-10-CM | POA: Diagnosis not present

## 2016-12-21 DIAGNOSIS — Z780 Asymptomatic menopausal state: Secondary | ICD-10-CM

## 2016-12-21 DIAGNOSIS — M8949 Other hypertrophic osteoarthropathy, multiple sites: Secondary | ICD-10-CM

## 2016-12-21 DIAGNOSIS — Z6839 Body mass index (BMI) 39.0-39.9, adult: Secondary | ICD-10-CM | POA: Diagnosis not present

## 2016-12-21 DIAGNOSIS — R944 Abnormal results of kidney function studies: Secondary | ICD-10-CM | POA: Diagnosis not present

## 2016-12-21 DIAGNOSIS — E782 Mixed hyperlipidemia: Secondary | ICD-10-CM | POA: Diagnosis not present

## 2016-12-21 DIAGNOSIS — M150 Primary generalized (osteo)arthritis: Principal | ICD-10-CM

## 2016-12-21 DIAGNOSIS — J309 Allergic rhinitis, unspecified: Secondary | ICD-10-CM | POA: Diagnosis not present

## 2016-12-21 DIAGNOSIS — E119 Type 2 diabetes mellitus without complications: Secondary | ICD-10-CM | POA: Diagnosis not present

## 2016-12-21 DIAGNOSIS — R609 Edema, unspecified: Secondary | ICD-10-CM | POA: Diagnosis not present

## 2016-12-21 DIAGNOSIS — F411 Generalized anxiety disorder: Secondary | ICD-10-CM | POA: Diagnosis not present

## 2016-12-26 ENCOUNTER — Ambulatory Visit (HOSPITAL_COMMUNITY)
Admission: RE | Admit: 2016-12-26 | Discharge: 2016-12-26 | Disposition: A | Discharge status: Home / Self Care | Payer: Medicare Other | Source: Ambulatory Visit | Attending: Internal Medicine | Admitting: Internal Medicine

## 2016-12-26 DIAGNOSIS — Z780 Asymptomatic menopausal state: Secondary | ICD-10-CM | POA: Diagnosis not present

## 2016-12-26 DIAGNOSIS — M85851 Other specified disorders of bone density and structure, right thigh: Secondary | ICD-10-CM | POA: Diagnosis not present

## 2017-04-19 DIAGNOSIS — E119 Type 2 diabetes mellitus without complications: Secondary | ICD-10-CM | POA: Diagnosis not present

## 2017-04-19 DIAGNOSIS — E559 Vitamin D deficiency, unspecified: Secondary | ICD-10-CM | POA: Diagnosis not present

## 2017-04-19 DIAGNOSIS — D509 Iron deficiency anemia, unspecified: Secondary | ICD-10-CM | POA: Diagnosis not present

## 2017-04-23 DIAGNOSIS — D509 Iron deficiency anemia, unspecified: Secondary | ICD-10-CM | POA: Diagnosis not present

## 2017-04-23 DIAGNOSIS — I10 Essential (primary) hypertension: Secondary | ICD-10-CM | POA: Diagnosis not present

## 2017-04-23 DIAGNOSIS — F4321 Adjustment disorder with depressed mood: Secondary | ICD-10-CM | POA: Diagnosis not present

## 2017-04-23 DIAGNOSIS — E782 Mixed hyperlipidemia: Secondary | ICD-10-CM | POA: Diagnosis not present

## 2017-04-23 DIAGNOSIS — N183 Chronic kidney disease, stage 3 (moderate): Secondary | ICD-10-CM | POA: Diagnosis not present

## 2017-04-23 DIAGNOSIS — F411 Generalized anxiety disorder: Secondary | ICD-10-CM | POA: Diagnosis not present

## 2017-04-23 DIAGNOSIS — E1122 Type 2 diabetes mellitus with diabetic chronic kidney disease: Secondary | ICD-10-CM | POA: Diagnosis not present

## 2017-05-10 DIAGNOSIS — Z713 Dietary counseling and surveillance: Secondary | ICD-10-CM | POA: Diagnosis not present

## 2017-05-10 DIAGNOSIS — N183 Chronic kidney disease, stage 3 (moderate): Secondary | ICD-10-CM | POA: Diagnosis not present

## 2017-05-10 DIAGNOSIS — I10 Essential (primary) hypertension: Secondary | ICD-10-CM | POA: Diagnosis not present

## 2017-05-27 ENCOUNTER — Other Ambulatory Visit: Payer: Self-pay

## 2017-06-27 DIAGNOSIS — Z9849 Cataract extraction status, unspecified eye: Secondary | ICD-10-CM | POA: Diagnosis not present

## 2017-06-27 DIAGNOSIS — H5034 Intermittent alternating exotropia: Secondary | ICD-10-CM | POA: Diagnosis not present

## 2017-06-27 DIAGNOSIS — H3530 Unspecified macular degeneration: Secondary | ICD-10-CM | POA: Diagnosis not present

## 2017-06-27 DIAGNOSIS — Z961 Presence of intraocular lens: Secondary | ICD-10-CM | POA: Diagnosis not present

## 2017-07-15 ENCOUNTER — Other Ambulatory Visit: Payer: Self-pay | Admitting: Internal Medicine

## 2017-07-15 DIAGNOSIS — Z1231 Encounter for screening mammogram for malignant neoplasm of breast: Secondary | ICD-10-CM

## 2017-07-31 ENCOUNTER — Ambulatory Visit
Admission: RE | Admit: 2017-07-31 | Discharge: 2017-07-31 | Disposition: A | Discharge status: Home / Self Care | Payer: Medicare Other | Source: Ambulatory Visit | Attending: Internal Medicine | Admitting: Internal Medicine

## 2017-07-31 DIAGNOSIS — Z1231 Encounter for screening mammogram for malignant neoplasm of breast: Secondary | ICD-10-CM | POA: Diagnosis not present

## 2017-07-31 IMAGING — MG DIGITAL SCREENING BILATERAL MAMMOGRAM WITH CAD
6 series · 6 of 6 positions shown · non-contrast
Comparison: Previous exam(s).

CLINICAL DATA: Screening.

EXAM:
DIGITAL SCREENING BILATERAL MAMMOGRAM WITH CAD

[R CC]
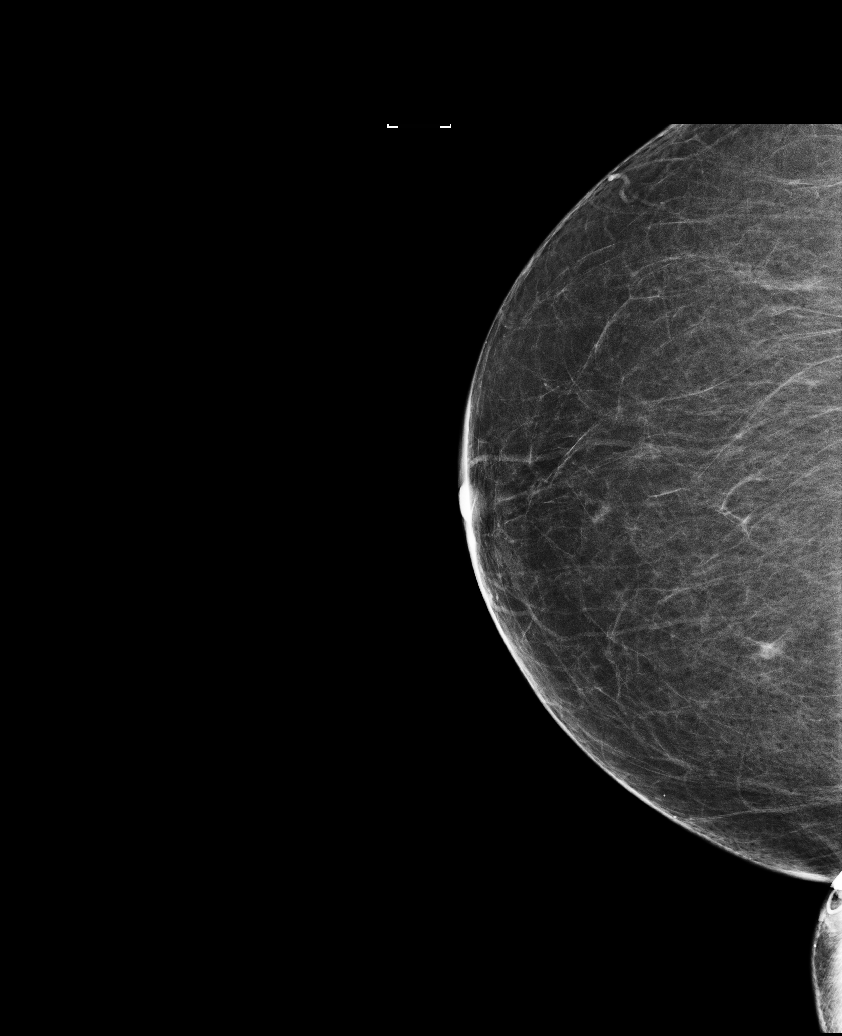

[R MLO (1 of 2)]
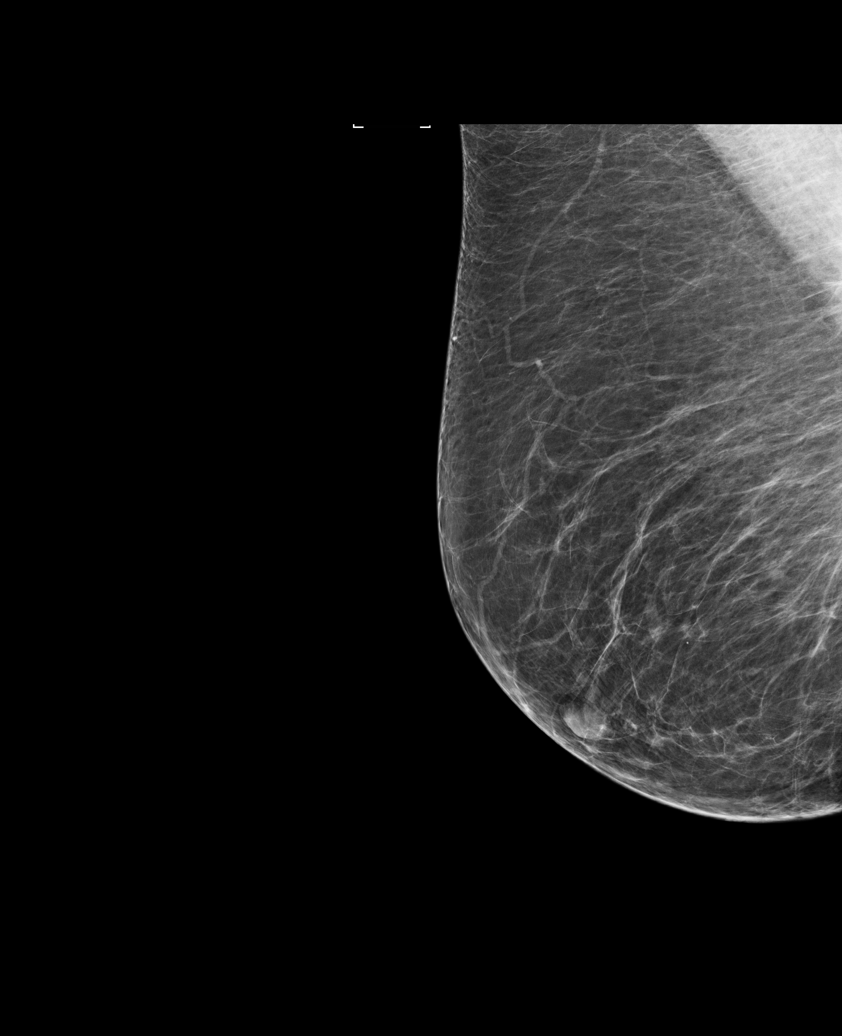

[L CC]
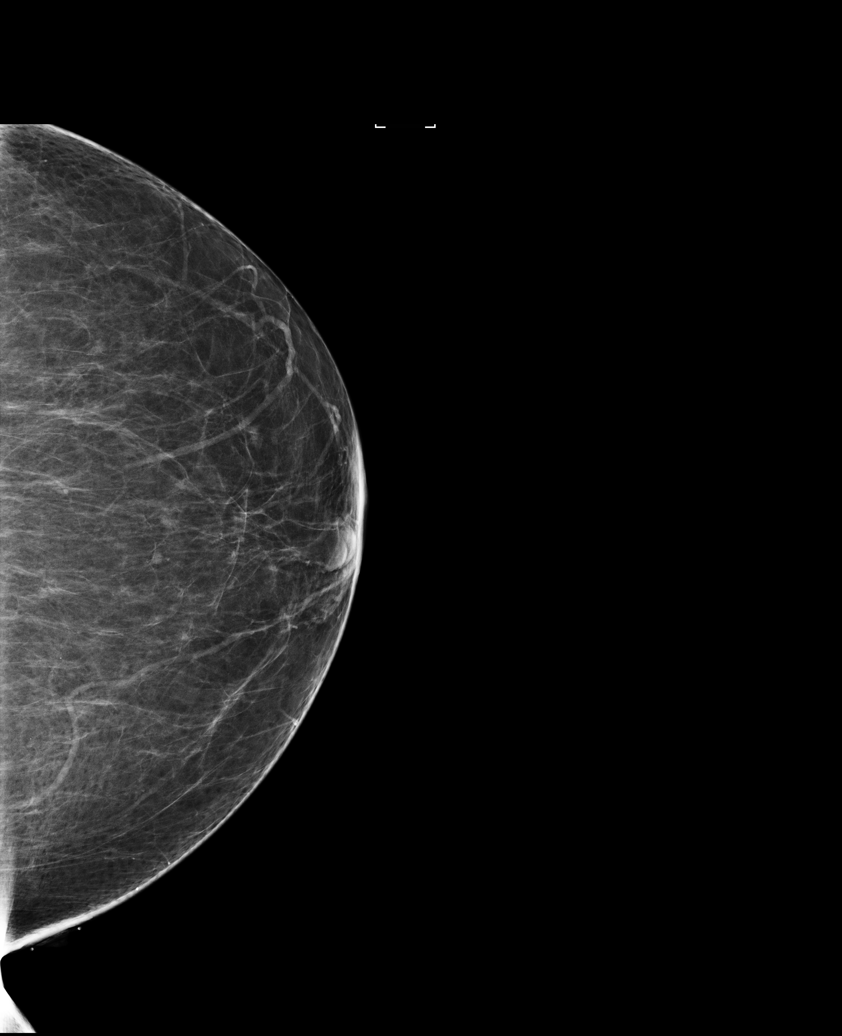

[L MLO (1 of 2)]
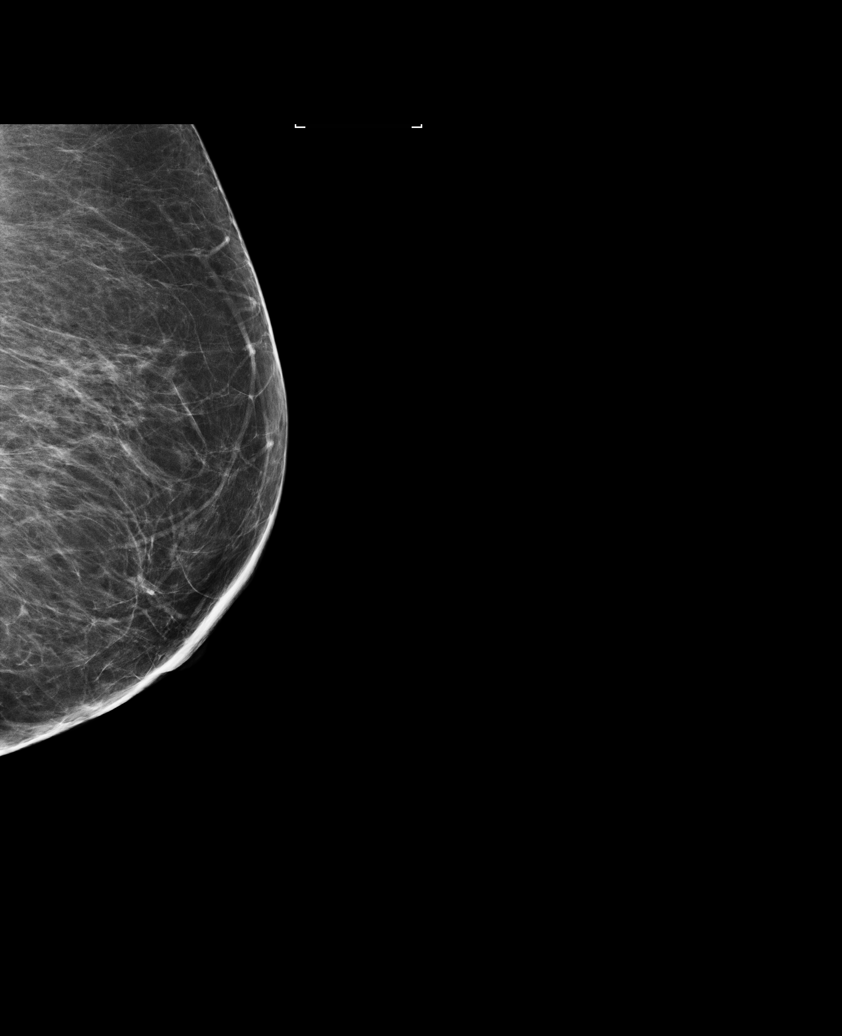

[R MLO (2 of 2)]
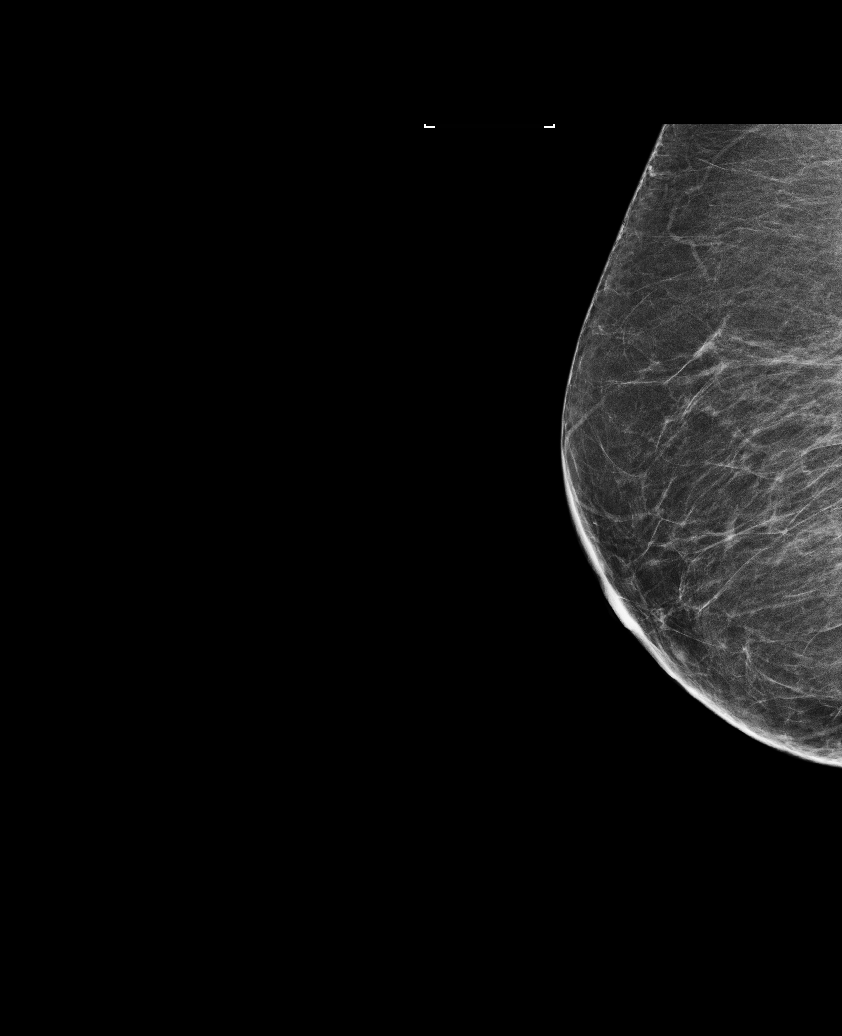

[L MLO (2 of 2)]
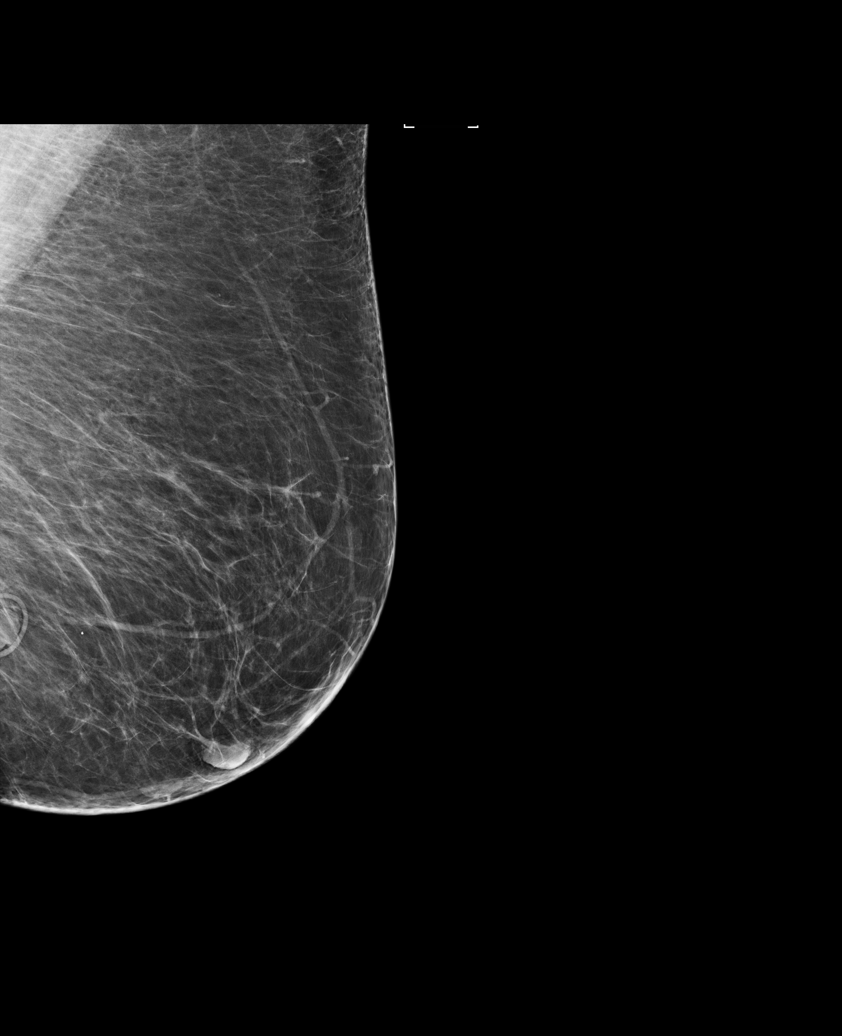

[6 of 6 positions shown; findings below may reference images not displayed]

ACR Breast Density Category b: There are scattered areas of
fibroglandular density.
FINDINGS: In the right breast, a possible asymmetry warrants further
evaluation. In the left breast, no findings suspicious for
malignancy. Images were processed with CAD.
IMPRESSION: Further evaluation is suggested for possible asymmetry in the right
breast.

RECOMMENDATION:
Diagnostic mammogram and possibly ultrasound of the right breast.
(Code:[1O])

The patient will be contacted regarding the findings, and additional
imaging will be scheduled.

BI-RADS CATEGORY  0: Incomplete. Need additional imaging evaluation
and/or prior mammograms for comparison.

## 2017-08-01 ENCOUNTER — Other Ambulatory Visit: Payer: Self-pay | Admitting: Internal Medicine

## 2017-08-01 DIAGNOSIS — R928 Other abnormal and inconclusive findings on diagnostic imaging of breast: Secondary | ICD-10-CM

## 2017-08-14 ENCOUNTER — Other Ambulatory Visit: Payer: Medicare Other

## 2017-08-19 ENCOUNTER — Other Ambulatory Visit: Payer: Medicare Other

## 2017-08-22 DIAGNOSIS — E119 Type 2 diabetes mellitus without complications: Secondary | ICD-10-CM | POA: Diagnosis not present

## 2017-08-22 DIAGNOSIS — D509 Iron deficiency anemia, unspecified: Secondary | ICD-10-CM | POA: Diagnosis not present

## 2017-08-22 DIAGNOSIS — E782 Mixed hyperlipidemia: Secondary | ICD-10-CM | POA: Diagnosis not present

## 2017-08-23 DIAGNOSIS — E119 Type 2 diabetes mellitus without complications: Secondary | ICD-10-CM | POA: Diagnosis not present

## 2017-08-23 DIAGNOSIS — D509 Iron deficiency anemia, unspecified: Secondary | ICD-10-CM | POA: Diagnosis not present

## 2017-08-23 DIAGNOSIS — E782 Mixed hyperlipidemia: Secondary | ICD-10-CM | POA: Diagnosis not present

## 2017-08-26 DIAGNOSIS — Z23 Encounter for immunization: Secondary | ICD-10-CM | POA: Diagnosis not present

## 2017-08-26 DIAGNOSIS — N183 Chronic kidney disease, stage 3 (moderate): Secondary | ICD-10-CM | POA: Diagnosis not present

## 2017-08-26 DIAGNOSIS — I10 Essential (primary) hypertension: Secondary | ICD-10-CM | POA: Diagnosis not present

## 2017-08-26 DIAGNOSIS — E782 Mixed hyperlipidemia: Secondary | ICD-10-CM | POA: Diagnosis not present

## 2017-08-26 DIAGNOSIS — E1122 Type 2 diabetes mellitus with diabetic chronic kidney disease: Secondary | ICD-10-CM | POA: Diagnosis not present

## 2017-09-11 DIAGNOSIS — H401131 Primary open-angle glaucoma, bilateral, mild stage: Secondary | ICD-10-CM | POA: Diagnosis not present

## 2017-10-23 ENCOUNTER — Other Ambulatory Visit: Payer: Medicare Other

## 2017-11-20 DIAGNOSIS — E782 Mixed hyperlipidemia: Secondary | ICD-10-CM | POA: Diagnosis not present

## 2017-11-20 DIAGNOSIS — E1122 Type 2 diabetes mellitus with diabetic chronic kidney disease: Secondary | ICD-10-CM | POA: Diagnosis not present

## 2017-11-20 DIAGNOSIS — I10 Essential (primary) hypertension: Secondary | ICD-10-CM | POA: Diagnosis not present

## 2017-11-22 DIAGNOSIS — E1122 Type 2 diabetes mellitus with diabetic chronic kidney disease: Secondary | ICD-10-CM | POA: Diagnosis not present

## 2017-11-22 DIAGNOSIS — D509 Iron deficiency anemia, unspecified: Secondary | ICD-10-CM | POA: Diagnosis not present

## 2017-11-22 DIAGNOSIS — E782 Mixed hyperlipidemia: Secondary | ICD-10-CM | POA: Diagnosis not present

## 2017-11-22 DIAGNOSIS — F4321 Adjustment disorder with depressed mood: Secondary | ICD-10-CM | POA: Diagnosis not present

## 2017-11-22 DIAGNOSIS — N183 Chronic kidney disease, stage 3 (moderate): Secondary | ICD-10-CM | POA: Diagnosis not present

## 2017-11-22 DIAGNOSIS — R609 Edema, unspecified: Secondary | ICD-10-CM | POA: Diagnosis not present

## 2017-11-22 DIAGNOSIS — F5101 Primary insomnia: Secondary | ICD-10-CM | POA: Diagnosis not present

## 2017-11-22 DIAGNOSIS — F411 Generalized anxiety disorder: Secondary | ICD-10-CM | POA: Diagnosis not present

## 2017-11-22 DIAGNOSIS — I10 Essential (primary) hypertension: Secondary | ICD-10-CM | POA: Diagnosis not present

## 2018-01-20 DIAGNOSIS — E782 Mixed hyperlipidemia: Secondary | ICD-10-CM | POA: Diagnosis not present

## 2018-01-20 DIAGNOSIS — Z6838 Body mass index (BMI) 38.0-38.9, adult: Secondary | ICD-10-CM | POA: Diagnosis not present

## 2018-01-20 DIAGNOSIS — R609 Edema, unspecified: Secondary | ICD-10-CM | POA: Diagnosis not present

## 2018-01-20 DIAGNOSIS — Z23 Encounter for immunization: Secondary | ICD-10-CM | POA: Diagnosis not present

## 2018-01-20 DIAGNOSIS — N183 Chronic kidney disease, stage 3 (moderate): Secondary | ICD-10-CM | POA: Diagnosis not present

## 2018-01-20 DIAGNOSIS — I10 Essential (primary) hypertension: Secondary | ICD-10-CM | POA: Diagnosis not present

## 2018-01-20 DIAGNOSIS — E1122 Type 2 diabetes mellitus with diabetic chronic kidney disease: Secondary | ICD-10-CM | POA: Diagnosis not present

## 2018-01-20 DIAGNOSIS — F5101 Primary insomnia: Secondary | ICD-10-CM | POA: Diagnosis not present

## 2018-01-20 DIAGNOSIS — F411 Generalized anxiety disorder: Secondary | ICD-10-CM | POA: Diagnosis not present

## 2018-01-20 DIAGNOSIS — F4321 Adjustment disorder with depressed mood: Secondary | ICD-10-CM | POA: Diagnosis not present

## 2018-01-20 DIAGNOSIS — D509 Iron deficiency anemia, unspecified: Secondary | ICD-10-CM | POA: Diagnosis not present

## 2018-01-29 DIAGNOSIS — F411 Generalized anxiety disorder: Secondary | ICD-10-CM | POA: Diagnosis not present

## 2018-01-29 DIAGNOSIS — N183 Chronic kidney disease, stage 3 (moderate): Secondary | ICD-10-CM | POA: Diagnosis not present

## 2018-01-29 DIAGNOSIS — F4321 Adjustment disorder with depressed mood: Secondary | ICD-10-CM | POA: Diagnosis not present

## 2018-01-29 DIAGNOSIS — D509 Iron deficiency anemia, unspecified: Secondary | ICD-10-CM | POA: Diagnosis not present

## 2018-01-29 DIAGNOSIS — I10 Essential (primary) hypertension: Secondary | ICD-10-CM | POA: Diagnosis not present

## 2018-01-29 DIAGNOSIS — R609 Edema, unspecified: Secondary | ICD-10-CM | POA: Diagnosis not present

## 2018-01-29 DIAGNOSIS — E1122 Type 2 diabetes mellitus with diabetic chronic kidney disease: Secondary | ICD-10-CM | POA: Diagnosis not present

## 2018-01-29 DIAGNOSIS — F5101 Primary insomnia: Secondary | ICD-10-CM | POA: Diagnosis not present

## 2018-01-29 DIAGNOSIS — Z23 Encounter for immunization: Secondary | ICD-10-CM | POA: Diagnosis not present

## 2018-01-29 DIAGNOSIS — E782 Mixed hyperlipidemia: Secondary | ICD-10-CM | POA: Diagnosis not present

## 2018-01-31 DIAGNOSIS — F411 Generalized anxiety disorder: Secondary | ICD-10-CM | POA: Diagnosis not present

## 2018-01-31 DIAGNOSIS — R928 Other abnormal and inconclusive findings on diagnostic imaging of breast: Secondary | ICD-10-CM | POA: Diagnosis not present

## 2018-01-31 DIAGNOSIS — I10 Essential (primary) hypertension: Secondary | ICD-10-CM | POA: Diagnosis not present

## 2018-01-31 DIAGNOSIS — N183 Chronic kidney disease, stage 3 (moderate): Secondary | ICD-10-CM | POA: Diagnosis not present

## 2018-01-31 DIAGNOSIS — Z634 Disappearance and death of family member: Secondary | ICD-10-CM | POA: Diagnosis not present

## 2018-01-31 DIAGNOSIS — E782 Mixed hyperlipidemia: Secondary | ICD-10-CM | POA: Diagnosis not present

## 2018-01-31 DIAGNOSIS — E1122 Type 2 diabetes mellitus with diabetic chronic kidney disease: Secondary | ICD-10-CM | POA: Diagnosis not present

## 2018-01-31 DIAGNOSIS — R609 Edema, unspecified: Secondary | ICD-10-CM | POA: Diagnosis not present

## 2018-01-31 DIAGNOSIS — Z6838 Body mass index (BMI) 38.0-38.9, adult: Secondary | ICD-10-CM | POA: Diagnosis not present

## 2018-01-31 DIAGNOSIS — M8580 Other specified disorders of bone density and structure, unspecified site: Secondary | ICD-10-CM | POA: Diagnosis not present

## 2018-03-23 DIAGNOSIS — Z634 Disappearance and death of family member: Secondary | ICD-10-CM | POA: Diagnosis not present

## 2018-03-23 DIAGNOSIS — Z1889 Other specified retained foreign body fragments: Secondary | ICD-10-CM | POA: Diagnosis not present

## 2018-03-23 DIAGNOSIS — E1122 Type 2 diabetes mellitus with diabetic chronic kidney disease: Secondary | ICD-10-CM | POA: Diagnosis not present

## 2018-03-23 DIAGNOSIS — W57XXXA Bitten or stung by nonvenomous insect and other nonvenomous arthropods, initial encounter: Secondary | ICD-10-CM | POA: Diagnosis not present

## 2018-03-23 DIAGNOSIS — M8580 Other specified disorders of bone density and structure, unspecified site: Secondary | ICD-10-CM | POA: Diagnosis not present

## 2018-03-23 DIAGNOSIS — E782 Mixed hyperlipidemia: Secondary | ICD-10-CM | POA: Diagnosis not present

## 2018-03-23 DIAGNOSIS — L03818 Cellulitis of other sites: Secondary | ICD-10-CM | POA: Diagnosis not present

## 2018-03-23 DIAGNOSIS — N183 Chronic kidney disease, stage 3 (moderate): Secondary | ICD-10-CM | POA: Diagnosis not present

## 2018-03-23 DIAGNOSIS — S30860A Insect bite (nonvenomous) of lower back and pelvis, initial encounter: Secondary | ICD-10-CM | POA: Diagnosis not present

## 2018-03-23 DIAGNOSIS — I10 Essential (primary) hypertension: Secondary | ICD-10-CM | POA: Diagnosis not present

## 2018-03-23 DIAGNOSIS — R928 Other abnormal and inconclusive findings on diagnostic imaging of breast: Secondary | ICD-10-CM | POA: Diagnosis not present

## 2018-03-23 DIAGNOSIS — Z23 Encounter for immunization: Secondary | ICD-10-CM | POA: Diagnosis not present

## 2018-06-03 DIAGNOSIS — D509 Iron deficiency anemia, unspecified: Secondary | ICD-10-CM | POA: Diagnosis not present

## 2018-06-03 DIAGNOSIS — I10 Essential (primary) hypertension: Secondary | ICD-10-CM | POA: Diagnosis not present

## 2018-06-03 DIAGNOSIS — N183 Chronic kidney disease, stage 3 (moderate): Secondary | ICD-10-CM | POA: Diagnosis not present

## 2018-06-03 DIAGNOSIS — E782 Mixed hyperlipidemia: Secondary | ICD-10-CM | POA: Diagnosis not present

## 2018-06-03 DIAGNOSIS — E1122 Type 2 diabetes mellitus with diabetic chronic kidney disease: Secondary | ICD-10-CM | POA: Diagnosis not present

## 2018-06-06 DIAGNOSIS — Z6839 Body mass index (BMI) 39.0-39.9, adult: Secondary | ICD-10-CM | POA: Diagnosis not present

## 2018-06-06 DIAGNOSIS — M8580 Other specified disorders of bone density and structure, unspecified site: Secondary | ICD-10-CM | POA: Diagnosis not present

## 2018-06-06 DIAGNOSIS — R928 Other abnormal and inconclusive findings on diagnostic imaging of breast: Secondary | ICD-10-CM | POA: Diagnosis not present

## 2018-06-06 DIAGNOSIS — Z634 Disappearance and death of family member: Secondary | ICD-10-CM | POA: Diagnosis not present

## 2018-06-06 DIAGNOSIS — R600 Localized edema: Secondary | ICD-10-CM | POA: Diagnosis not present

## 2018-06-06 DIAGNOSIS — F411 Generalized anxiety disorder: Secondary | ICD-10-CM | POA: Diagnosis not present

## 2018-06-06 DIAGNOSIS — N183 Chronic kidney disease, stage 3 (moderate): Secondary | ICD-10-CM | POA: Diagnosis not present

## 2018-06-06 DIAGNOSIS — I10 Essential (primary) hypertension: Secondary | ICD-10-CM | POA: Diagnosis not present

## 2018-06-06 DIAGNOSIS — E1122 Type 2 diabetes mellitus with diabetic chronic kidney disease: Secondary | ICD-10-CM | POA: Diagnosis not present

## 2018-06-06 DIAGNOSIS — E782 Mixed hyperlipidemia: Secondary | ICD-10-CM | POA: Diagnosis not present

## 2018-06-06 DIAGNOSIS — E875 Hyperkalemia: Secondary | ICD-10-CM | POA: Diagnosis not present

## 2018-06-09 DIAGNOSIS — H401131 Primary open-angle glaucoma, bilateral, mild stage: Secondary | ICD-10-CM | POA: Diagnosis not present

## 2018-06-09 DIAGNOSIS — H5034 Intermittent alternating exotropia: Secondary | ICD-10-CM | POA: Diagnosis not present

## 2018-06-27 DIAGNOSIS — M8580 Other specified disorders of bone density and structure, unspecified site: Secondary | ICD-10-CM | POA: Diagnosis not present

## 2018-06-27 DIAGNOSIS — E782 Mixed hyperlipidemia: Secondary | ICD-10-CM | POA: Diagnosis not present

## 2018-06-27 DIAGNOSIS — Z1889 Other specified retained foreign body fragments: Secondary | ICD-10-CM | POA: Diagnosis not present

## 2018-06-27 DIAGNOSIS — E1122 Type 2 diabetes mellitus with diabetic chronic kidney disease: Secondary | ICD-10-CM | POA: Diagnosis not present

## 2018-06-27 DIAGNOSIS — I10 Essential (primary) hypertension: Secondary | ICD-10-CM | POA: Diagnosis not present

## 2018-06-27 DIAGNOSIS — R600 Localized edema: Secondary | ICD-10-CM | POA: Diagnosis not present

## 2018-06-27 DIAGNOSIS — E875 Hyperkalemia: Secondary | ICD-10-CM | POA: Diagnosis not present

## 2018-06-27 DIAGNOSIS — R928 Other abnormal and inconclusive findings on diagnostic imaging of breast: Secondary | ICD-10-CM | POA: Diagnosis not present

## 2018-06-27 DIAGNOSIS — Z6839 Body mass index (BMI) 39.0-39.9, adult: Secondary | ICD-10-CM | POA: Diagnosis not present

## 2018-06-27 DIAGNOSIS — N183 Chronic kidney disease, stage 3 (moderate): Secondary | ICD-10-CM | POA: Diagnosis not present

## 2018-06-27 DIAGNOSIS — W57XXXA Bitten or stung by nonvenomous insect and other nonvenomous arthropods, initial encounter: Secondary | ICD-10-CM | POA: Diagnosis not present

## 2018-06-27 DIAGNOSIS — L03818 Cellulitis of other sites: Secondary | ICD-10-CM | POA: Diagnosis not present

## 2018-10-01 DIAGNOSIS — H401131 Primary open-angle glaucoma, bilateral, mild stage: Secondary | ICD-10-CM | POA: Diagnosis not present

## 2018-10-06 DIAGNOSIS — I10 Essential (primary) hypertension: Secondary | ICD-10-CM | POA: Diagnosis not present

## 2018-10-06 DIAGNOSIS — E782 Mixed hyperlipidemia: Secondary | ICD-10-CM | POA: Diagnosis not present

## 2018-10-06 DIAGNOSIS — E1122 Type 2 diabetes mellitus with diabetic chronic kidney disease: Secondary | ICD-10-CM | POA: Diagnosis not present

## 2018-10-06 DIAGNOSIS — E875 Hyperkalemia: Secondary | ICD-10-CM | POA: Diagnosis not present

## 2018-10-06 DIAGNOSIS — N183 Chronic kidney disease, stage 3 (moderate): Secondary | ICD-10-CM | POA: Diagnosis not present

## 2018-10-06 DIAGNOSIS — Z23 Encounter for immunization: Secondary | ICD-10-CM | POA: Diagnosis not present

## 2018-10-06 DIAGNOSIS — D509 Iron deficiency anemia, unspecified: Secondary | ICD-10-CM | POA: Diagnosis not present

## 2018-10-10 DIAGNOSIS — R600 Localized edema: Secondary | ICD-10-CM | POA: Diagnosis not present

## 2018-10-10 DIAGNOSIS — M8580 Other specified disorders of bone density and structure, unspecified site: Secondary | ICD-10-CM | POA: Diagnosis not present

## 2018-10-10 DIAGNOSIS — E1122 Type 2 diabetes mellitus with diabetic chronic kidney disease: Secondary | ICD-10-CM | POA: Diagnosis not present

## 2018-10-10 DIAGNOSIS — N183 Chronic kidney disease, stage 3 (moderate): Secondary | ICD-10-CM | POA: Diagnosis not present

## 2018-10-10 DIAGNOSIS — E782 Mixed hyperlipidemia: Secondary | ICD-10-CM | POA: Diagnosis not present

## 2018-10-10 DIAGNOSIS — I10 Essential (primary) hypertension: Secondary | ICD-10-CM | POA: Diagnosis not present

## 2018-10-10 DIAGNOSIS — F411 Generalized anxiety disorder: Secondary | ICD-10-CM | POA: Diagnosis not present

## 2018-10-10 DIAGNOSIS — Z634 Disappearance and death of family member: Secondary | ICD-10-CM | POA: Diagnosis not present

## 2018-10-23 DIAGNOSIS — H01002 Unspecified blepharitis right lower eyelid: Secondary | ICD-10-CM | POA: Diagnosis not present

## 2018-10-23 DIAGNOSIS — H01004 Unspecified blepharitis left upper eyelid: Secondary | ICD-10-CM | POA: Diagnosis not present

## 2018-10-23 DIAGNOSIS — H01001 Unspecified blepharitis right upper eyelid: Secondary | ICD-10-CM | POA: Diagnosis not present

## 2018-10-23 DIAGNOSIS — H01005 Unspecified blepharitis left lower eyelid: Secondary | ICD-10-CM | POA: Diagnosis not present

## 2018-10-29 DIAGNOSIS — C50919 Malignant neoplasm of unspecified site of unspecified female breast: Secondary | ICD-10-CM

## 2018-10-29 HISTORY — PX: BREAST BIOPSY: SHX20

## 2018-10-29 HISTORY — DX: Malignant neoplasm of unspecified site of unspecified female breast: C50.919

## 2018-11-20 DIAGNOSIS — H353132 Nonexudative age-related macular degeneration, bilateral, intermediate dry stage: Secondary | ICD-10-CM | POA: Diagnosis not present

## 2018-11-20 DIAGNOSIS — H40052 Ocular hypertension, left eye: Secondary | ICD-10-CM | POA: Diagnosis not present

## 2018-12-28 DEATH — deceased

## 2019-01-16 DIAGNOSIS — E1122 Type 2 diabetes mellitus with diabetic chronic kidney disease: Secondary | ICD-10-CM | POA: Diagnosis not present

## 2019-01-16 DIAGNOSIS — D509 Iron deficiency anemia, unspecified: Secondary | ICD-10-CM | POA: Diagnosis not present

## 2019-01-16 DIAGNOSIS — N183 Chronic kidney disease, stage 3 (moderate): Secondary | ICD-10-CM | POA: Diagnosis not present

## 2019-01-16 DIAGNOSIS — I10 Essential (primary) hypertension: Secondary | ICD-10-CM | POA: Diagnosis not present

## 2019-01-16 DIAGNOSIS — E782 Mixed hyperlipidemia: Secondary | ICD-10-CM | POA: Diagnosis not present

## 2019-01-23 DIAGNOSIS — E782 Mixed hyperlipidemia: Secondary | ICD-10-CM | POA: Diagnosis not present

## 2019-01-23 DIAGNOSIS — H409 Unspecified glaucoma: Secondary | ICD-10-CM | POA: Diagnosis not present

## 2019-01-23 DIAGNOSIS — E875 Hyperkalemia: Secondary | ICD-10-CM | POA: Diagnosis not present

## 2019-01-23 DIAGNOSIS — Z634 Disappearance and death of family member: Secondary | ICD-10-CM | POA: Diagnosis not present

## 2019-01-23 DIAGNOSIS — J302 Other seasonal allergic rhinitis: Secondary | ICD-10-CM | POA: Diagnosis not present

## 2019-01-23 DIAGNOSIS — I83012 Varicose veins of right lower extremity with ulcer of calf: Secondary | ICD-10-CM | POA: Diagnosis not present

## 2019-01-23 DIAGNOSIS — N183 Chronic kidney disease, stage 3 (moderate): Secondary | ICD-10-CM | POA: Diagnosis not present

## 2019-01-23 DIAGNOSIS — M8580 Other specified disorders of bone density and structure, unspecified site: Secondary | ICD-10-CM | POA: Diagnosis not present

## 2019-01-23 DIAGNOSIS — E1122 Type 2 diabetes mellitus with diabetic chronic kidney disease: Secondary | ICD-10-CM | POA: Diagnosis not present

## 2019-01-23 DIAGNOSIS — I10 Essential (primary) hypertension: Secondary | ICD-10-CM | POA: Diagnosis not present

## 2019-01-23 DIAGNOSIS — F419 Anxiety disorder, unspecified: Secondary | ICD-10-CM | POA: Diagnosis not present

## 2019-01-23 DIAGNOSIS — R600 Localized edema: Secondary | ICD-10-CM | POA: Diagnosis not present

## 2019-02-12 DIAGNOSIS — S81801A Unspecified open wound, right lower leg, initial encounter: Secondary | ICD-10-CM | POA: Diagnosis not present

## 2019-03-11 ENCOUNTER — Encounter: Payer: Self-pay | Admitting: Gastroenterology

## 2019-03-30 DIAGNOSIS — Z0000 Encounter for general adult medical examination without abnormal findings: Secondary | ICD-10-CM | POA: Diagnosis not present

## 2019-04-03 DIAGNOSIS — E1122 Type 2 diabetes mellitus with diabetic chronic kidney disease: Secondary | ICD-10-CM | POA: Diagnosis not present

## 2019-04-03 DIAGNOSIS — E782 Mixed hyperlipidemia: Secondary | ICD-10-CM | POA: Diagnosis not present

## 2019-04-03 DIAGNOSIS — I83012 Varicose veins of right lower extremity with ulcer of calf: Secondary | ICD-10-CM | POA: Diagnosis not present

## 2019-04-03 DIAGNOSIS — F419 Anxiety disorder, unspecified: Secondary | ICD-10-CM | POA: Diagnosis not present

## 2019-04-03 DIAGNOSIS — I10 Essential (primary) hypertension: Secondary | ICD-10-CM | POA: Diagnosis not present

## 2019-04-03 DIAGNOSIS — N183 Chronic kidney disease, stage 3 (moderate): Secondary | ICD-10-CM | POA: Diagnosis not present

## 2019-04-03 DIAGNOSIS — E875 Hyperkalemia: Secondary | ICD-10-CM | POA: Diagnosis not present

## 2019-04-03 DIAGNOSIS — R600 Localized edema: Secondary | ICD-10-CM | POA: Diagnosis not present

## 2019-04-09 DIAGNOSIS — H01001 Unspecified blepharitis right upper eyelid: Secondary | ICD-10-CM | POA: Diagnosis not present

## 2019-04-09 DIAGNOSIS — H353211 Exudative age-related macular degeneration, right eye, with active choroidal neovascularization: Secondary | ICD-10-CM | POA: Diagnosis not present

## 2019-04-09 DIAGNOSIS — H40052 Ocular hypertension, left eye: Secondary | ICD-10-CM | POA: Diagnosis not present

## 2019-04-09 DIAGNOSIS — H353122 Nonexudative age-related macular degeneration, left eye, intermediate dry stage: Secondary | ICD-10-CM | POA: Diagnosis not present

## 2019-04-28 DIAGNOSIS — E1122 Type 2 diabetes mellitus with diabetic chronic kidney disease: Secondary | ICD-10-CM | POA: Diagnosis not present

## 2019-04-28 DIAGNOSIS — D509 Iron deficiency anemia, unspecified: Secondary | ICD-10-CM | POA: Diagnosis not present

## 2019-04-28 DIAGNOSIS — N183 Chronic kidney disease, stage 3 (moderate): Secondary | ICD-10-CM | POA: Diagnosis not present

## 2019-04-28 DIAGNOSIS — E782 Mixed hyperlipidemia: Secondary | ICD-10-CM | POA: Diagnosis not present

## 2019-04-28 DIAGNOSIS — I10 Essential (primary) hypertension: Secondary | ICD-10-CM | POA: Diagnosis not present

## 2019-05-04 ENCOUNTER — Other Ambulatory Visit: Payer: Self-pay | Admitting: Internal Medicine

## 2019-05-04 DIAGNOSIS — Z634 Disappearance and death of family member: Secondary | ICD-10-CM | POA: Diagnosis not present

## 2019-05-04 DIAGNOSIS — N183 Chronic kidney disease, stage 3 (moderate): Secondary | ICD-10-CM | POA: Diagnosis not present

## 2019-05-04 DIAGNOSIS — E875 Hyperkalemia: Secondary | ICD-10-CM | POA: Diagnosis not present

## 2019-05-04 DIAGNOSIS — H409 Unspecified glaucoma: Secondary | ICD-10-CM | POA: Diagnosis not present

## 2019-05-04 DIAGNOSIS — Z1231 Encounter for screening mammogram for malignant neoplasm of breast: Secondary | ICD-10-CM

## 2019-05-04 DIAGNOSIS — I10 Essential (primary) hypertension: Secondary | ICD-10-CM | POA: Diagnosis not present

## 2019-05-04 DIAGNOSIS — F419 Anxiety disorder, unspecified: Secondary | ICD-10-CM | POA: Diagnosis not present

## 2019-05-04 DIAGNOSIS — E2839 Other primary ovarian failure: Secondary | ICD-10-CM

## 2019-05-04 DIAGNOSIS — R600 Localized edema: Secondary | ICD-10-CM | POA: Diagnosis not present

## 2019-05-04 DIAGNOSIS — E1122 Type 2 diabetes mellitus with diabetic chronic kidney disease: Secondary | ICD-10-CM | POA: Diagnosis not present

## 2019-05-04 DIAGNOSIS — I83012 Varicose veins of right lower extremity with ulcer of calf: Secondary | ICD-10-CM | POA: Diagnosis not present

## 2019-05-04 DIAGNOSIS — J302 Other seasonal allergic rhinitis: Secondary | ICD-10-CM | POA: Diagnosis not present

## 2019-05-04 DIAGNOSIS — M8580 Other specified disorders of bone density and structure, unspecified site: Secondary | ICD-10-CM | POA: Diagnosis not present

## 2019-05-04 DIAGNOSIS — Z780 Asymptomatic menopausal state: Secondary | ICD-10-CM

## 2019-05-04 DIAGNOSIS — E782 Mixed hyperlipidemia: Secondary | ICD-10-CM | POA: Diagnosis not present

## 2019-05-14 DIAGNOSIS — H43813 Vitreous degeneration, bilateral: Secondary | ICD-10-CM | POA: Diagnosis not present

## 2019-05-14 DIAGNOSIS — H353231 Exudative age-related macular degeneration, bilateral, with active choroidal neovascularization: Secondary | ICD-10-CM | POA: Diagnosis not present

## 2019-05-14 DIAGNOSIS — H35033 Hypertensive retinopathy, bilateral: Secondary | ICD-10-CM | POA: Diagnosis not present

## 2019-05-14 DIAGNOSIS — H3540 Unspecified peripheral retinal degeneration: Secondary | ICD-10-CM | POA: Diagnosis not present

## 2019-05-18 DIAGNOSIS — H3530 Unspecified macular degeneration: Secondary | ICD-10-CM | POA: Diagnosis not present

## 2019-05-18 DIAGNOSIS — S81801A Unspecified open wound, right lower leg, initial encounter: Secondary | ICD-10-CM | POA: Diagnosis not present

## 2019-05-18 DIAGNOSIS — W57XXXA Bitten or stung by nonvenomous insect and other nonvenomous arthropods, initial encounter: Secondary | ICD-10-CM | POA: Diagnosis not present

## 2019-05-18 DIAGNOSIS — R600 Localized edema: Secondary | ICD-10-CM | POA: Diagnosis not present

## 2019-05-18 DIAGNOSIS — H409 Unspecified glaucoma: Secondary | ICD-10-CM | POA: Diagnosis not present

## 2019-05-18 DIAGNOSIS — F419 Anxiety disorder, unspecified: Secondary | ICD-10-CM | POA: Diagnosis not present

## 2019-05-18 DIAGNOSIS — I83012 Varicose veins of right lower extremity with ulcer of calf: Secondary | ICD-10-CM | POA: Diagnosis not present

## 2019-05-18 DIAGNOSIS — R928 Other abnormal and inconclusive findings on diagnostic imaging of breast: Secondary | ICD-10-CM | POA: Diagnosis not present

## 2019-05-18 DIAGNOSIS — Z1889 Other specified retained foreign body fragments: Secondary | ICD-10-CM | POA: Diagnosis not present

## 2019-05-18 DIAGNOSIS — N183 Chronic kidney disease, stage 3 (moderate): Secondary | ICD-10-CM | POA: Diagnosis not present

## 2019-05-18 DIAGNOSIS — Z6839 Body mass index (BMI) 39.0-39.9, adult: Secondary | ICD-10-CM | POA: Diagnosis not present

## 2019-05-18 DIAGNOSIS — L03818 Cellulitis of other sites: Secondary | ICD-10-CM | POA: Diagnosis not present

## 2019-05-18 DIAGNOSIS — H353230 Exudative age-related macular degeneration, bilateral, stage unspecified: Secondary | ICD-10-CM | POA: Diagnosis not present

## 2019-05-18 DIAGNOSIS — M8580 Other specified disorders of bone density and structure, unspecified site: Secondary | ICD-10-CM | POA: Diagnosis not present

## 2019-05-18 DIAGNOSIS — I10 Essential (primary) hypertension: Secondary | ICD-10-CM | POA: Diagnosis not present

## 2019-05-18 DIAGNOSIS — E875 Hyperkalemia: Secondary | ICD-10-CM | POA: Diagnosis not present

## 2019-05-27 ENCOUNTER — Other Ambulatory Visit (HOSPITAL_COMMUNITY): Payer: Self-pay | Admitting: Internal Medicine

## 2019-05-27 DIAGNOSIS — E2839 Other primary ovarian failure: Secondary | ICD-10-CM

## 2019-05-27 DIAGNOSIS — Z1231 Encounter for screening mammogram for malignant neoplasm of breast: Secondary | ICD-10-CM

## 2019-05-28 ENCOUNTER — Other Ambulatory Visit: Payer: Self-pay

## 2019-05-29 ENCOUNTER — Ambulatory Visit (HOSPITAL_COMMUNITY): Payer: Medicare Other

## 2019-06-02 DIAGNOSIS — Z1211 Encounter for screening for malignant neoplasm of colon: Secondary | ICD-10-CM | POA: Diagnosis not present

## 2019-06-02 DIAGNOSIS — Z1212 Encounter for screening for malignant neoplasm of rectum: Secondary | ICD-10-CM | POA: Diagnosis not present

## 2019-06-09 ENCOUNTER — Ambulatory Visit (HOSPITAL_COMMUNITY)
Admission: RE | Admit: 2019-06-09 | Discharge: 2019-06-09 | Disposition: A | Discharge status: Home / Self Care | Payer: Medicare Other | Source: Ambulatory Visit | Attending: Internal Medicine | Admitting: Internal Medicine

## 2019-06-09 ENCOUNTER — Other Ambulatory Visit: Payer: Self-pay

## 2019-06-09 ENCOUNTER — Ambulatory Visit (HOSPITAL_COMMUNITY): Payer: Medicare Other

## 2019-06-09 DIAGNOSIS — R928 Other abnormal and inconclusive findings on diagnostic imaging of breast: Secondary | ICD-10-CM | POA: Diagnosis not present

## 2019-06-09 DIAGNOSIS — E2839 Other primary ovarian failure: Secondary | ICD-10-CM | POA: Diagnosis not present

## 2019-06-09 DIAGNOSIS — Z1231 Encounter for screening mammogram for malignant neoplasm of breast: Secondary | ICD-10-CM

## 2019-06-09 DIAGNOSIS — N6489 Other specified disorders of breast: Secondary | ICD-10-CM | POA: Diagnosis not present

## 2019-06-09 IMAGING — MG DIGITAL DIAGNOSTIC BILATERAL MAMMOGRAM WITH TOMO AND CAD
6 of 10 series · 6 of 30 positions shown · non-contrast
Comparison: Previous exam(s).

CLINICAL DATA: Patient had a possible asymmetry in the medial right
breast noted on the screening cc view from [DATE]. She was
recommended to have a follow-up diagnostic exam for further
evaluation. This is her first examination since the [DATE]
study. Patient has no current breast complaints.

EXAM:
DIGITAL DIAGNOSTIC BILATERAL MAMMOGRAM WITH CAD AND TOMO
ULTRASOUND RIGHT BREAST

[R CC synth-2D (1 of 2)]
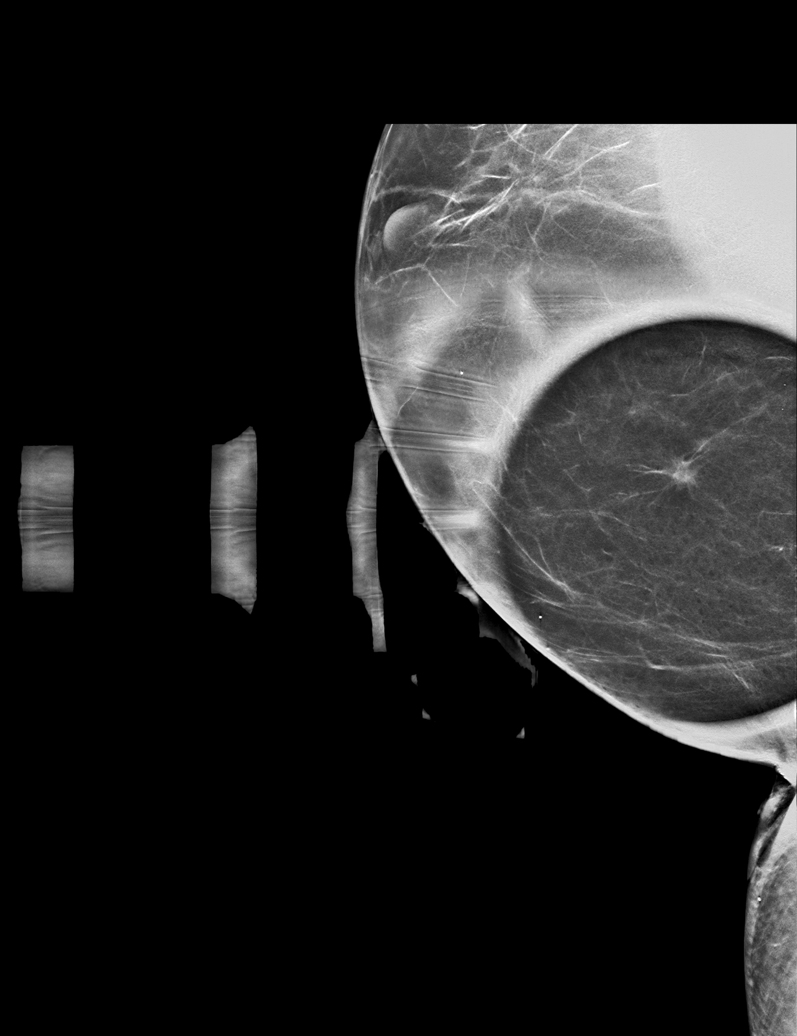

[L MLO synth-2D]
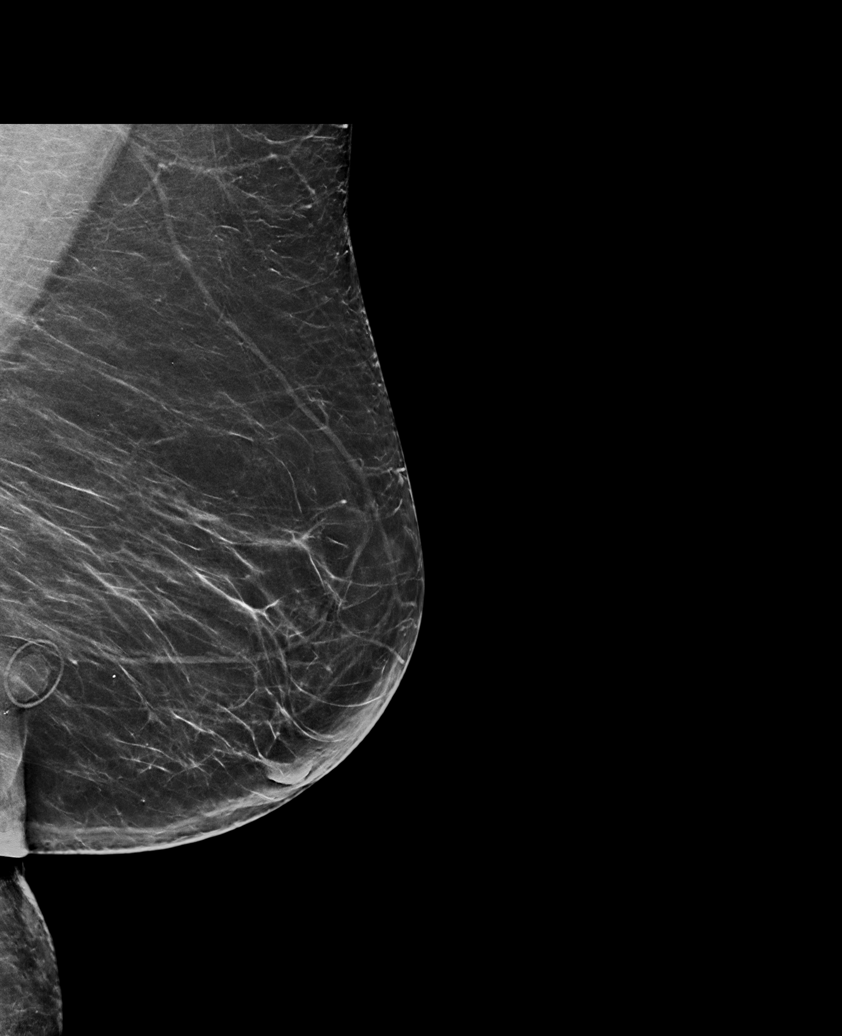

[R CC synth-2D (2 of 2)]
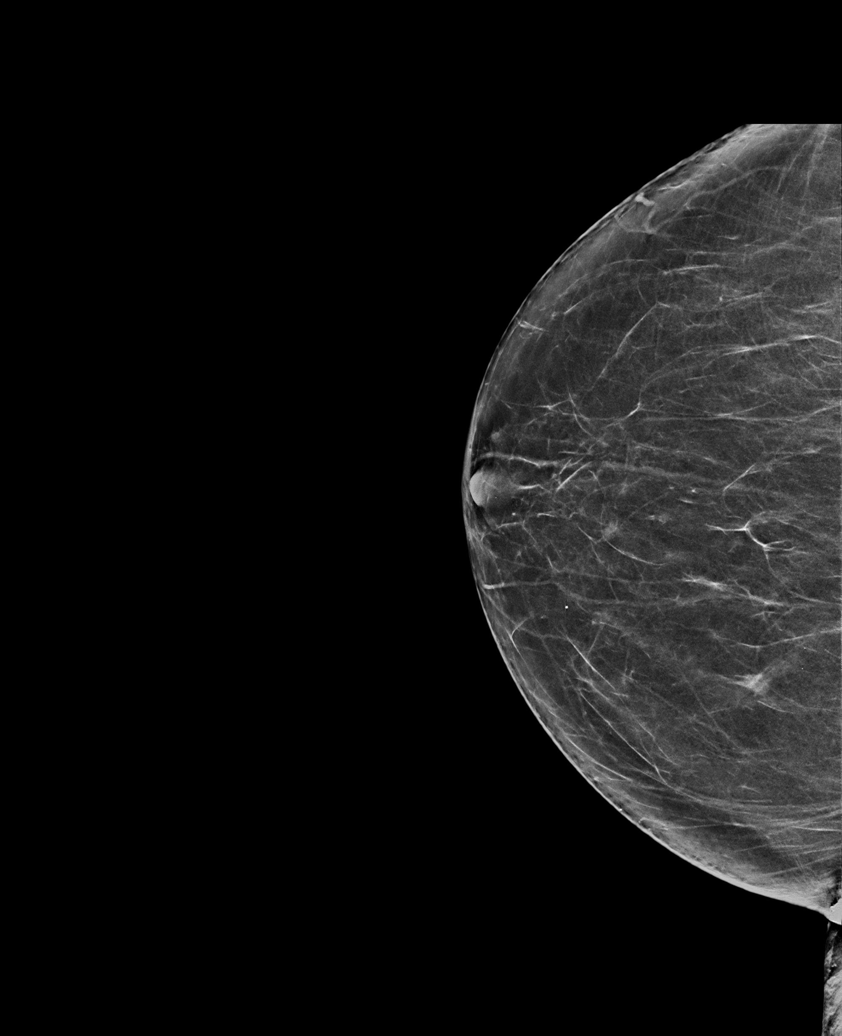

[L CC synth-2D]
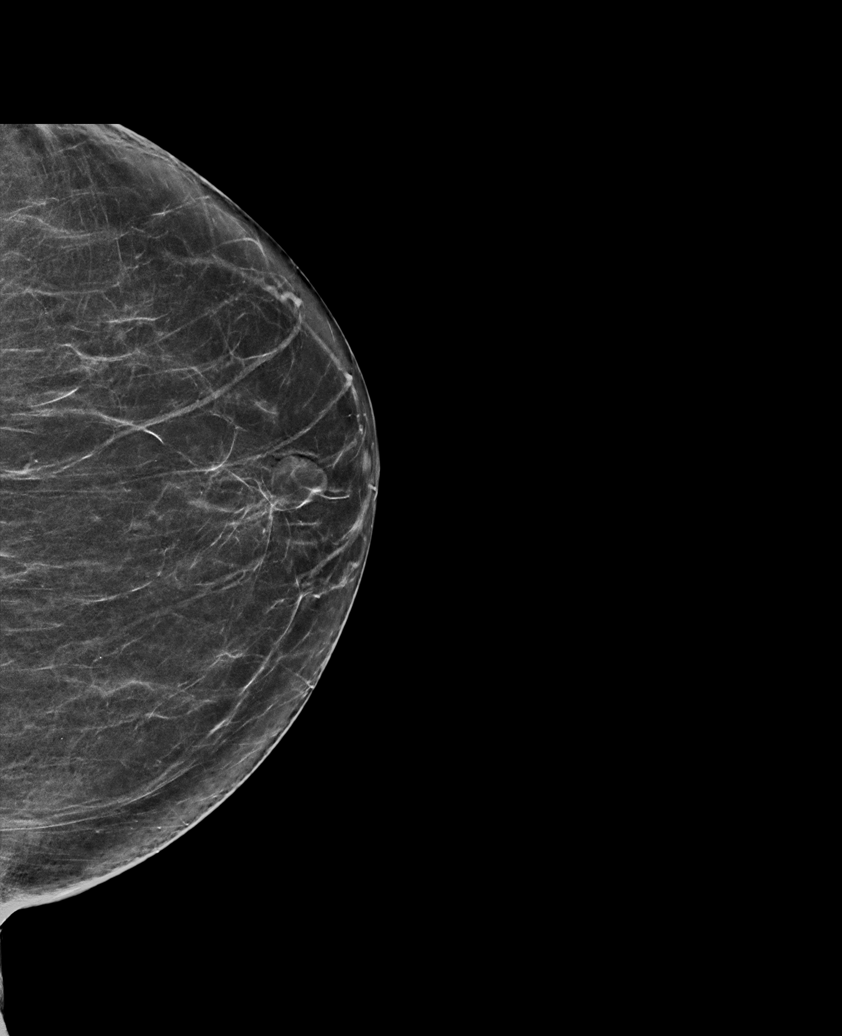

[R MLO synth-2D]
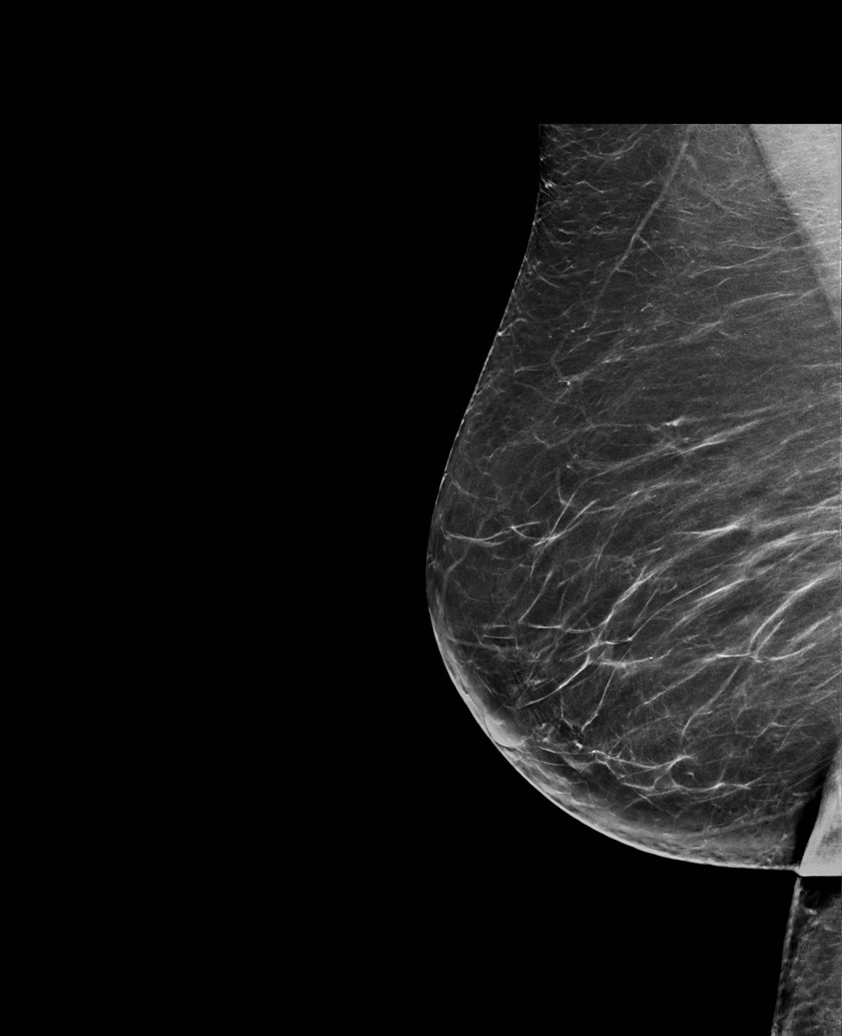

[R CC tomo · tomo slice 27/53.0]
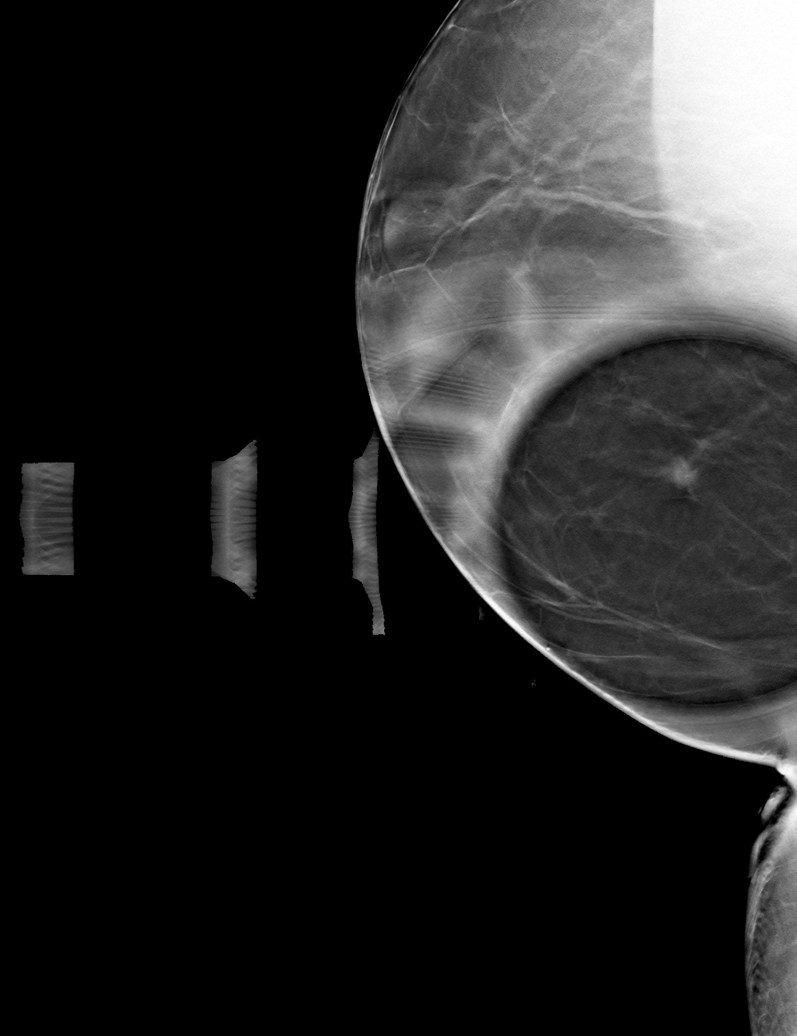

[6 of 30 positions shown; findings below may reference images not displayed]

ACR Breast Density Category b: There are scattered areas of
fibroglandular density.
FINDINGS: The asymmetry noted in the medial right breast is again appreciated.
It may be minimally larger than it was on [DATE]. On a spot
compression CC view, it becomes more focal, appearing as a mass with
associated architectural distortion, with the mass measuring
approximately 4 mm. It is more subtly evident on the 3D MLO images,
without the apparent distortion.

There are no other mammographic abnormalities.

Mammographic images were processed with CAD.

On physical exam, no mass is palpated in the medial right breast.

Targeted ultrasound is performed, showing normal tissue throughout
the medial right breast. No mass or architectural distortion. There
is no sonographic correlate for the mammographic finding.

Sonographic evaluation of the right axilla shows no enlarged or
abnormal lymph nodes.
IMPRESSION: 1. Suspicious mass in the medial right breast with no sonographic
correlate.

RECOMMENDATION:
1. Stereotactic core needle biopsy recommended for the medial right
breast mass.

I have discussed the findings and recommendations with the patient.
Results were also provided in writing at the conclusion of the
visit. If applicable, a reminder letter will be sent to the patient
regarding the next appointment.

BI-RADS CATEGORY  4: Suspicious.

## 2019-06-12 ENCOUNTER — Other Ambulatory Visit: Payer: Self-pay | Admitting: Internal Medicine

## 2019-06-12 DIAGNOSIS — N6310 Unspecified lump in the right breast, unspecified quadrant: Secondary | ICD-10-CM

## 2019-06-16 DIAGNOSIS — L03818 Cellulitis of other sites: Secondary | ICD-10-CM | POA: Diagnosis not present

## 2019-06-16 DIAGNOSIS — E875 Hyperkalemia: Secondary | ICD-10-CM | POA: Diagnosis not present

## 2019-06-16 DIAGNOSIS — I83012 Varicose veins of right lower extremity with ulcer of calf: Secondary | ICD-10-CM | POA: Diagnosis not present

## 2019-06-16 DIAGNOSIS — F419 Anxiety disorder, unspecified: Secondary | ICD-10-CM | POA: Diagnosis not present

## 2019-06-16 DIAGNOSIS — Z1889 Other specified retained foreign body fragments: Secondary | ICD-10-CM | POA: Diagnosis not present

## 2019-06-16 DIAGNOSIS — H409 Unspecified glaucoma: Secondary | ICD-10-CM | POA: Diagnosis not present

## 2019-06-16 DIAGNOSIS — N183 Chronic kidney disease, stage 3 (moderate): Secondary | ICD-10-CM | POA: Diagnosis not present

## 2019-06-16 DIAGNOSIS — R928 Other abnormal and inconclusive findings on diagnostic imaging of breast: Secondary | ICD-10-CM | POA: Diagnosis not present

## 2019-06-16 DIAGNOSIS — W57XXXA Bitten or stung by nonvenomous insect and other nonvenomous arthropods, initial encounter: Secondary | ICD-10-CM | POA: Diagnosis not present

## 2019-06-16 DIAGNOSIS — Z6839 Body mass index (BMI) 39.0-39.9, adult: Secondary | ICD-10-CM | POA: Diagnosis not present

## 2019-06-16 DIAGNOSIS — R600 Localized edema: Secondary | ICD-10-CM | POA: Diagnosis not present

## 2019-06-16 DIAGNOSIS — M8580 Other specified disorders of bone density and structure, unspecified site: Secondary | ICD-10-CM | POA: Diagnosis not present

## 2019-06-16 DIAGNOSIS — H3530 Unspecified macular degeneration: Secondary | ICD-10-CM | POA: Diagnosis not present

## 2019-06-16 DIAGNOSIS — I10 Essential (primary) hypertension: Secondary | ICD-10-CM | POA: Diagnosis not present

## 2019-06-16 DIAGNOSIS — S81801A Unspecified open wound, right lower leg, initial encounter: Secondary | ICD-10-CM | POA: Diagnosis not present

## 2019-06-17 DIAGNOSIS — H353231 Exudative age-related macular degeneration, bilateral, with active choroidal neovascularization: Secondary | ICD-10-CM | POA: Diagnosis not present

## 2019-06-18 ENCOUNTER — Other Ambulatory Visit: Payer: Self-pay | Admitting: Internal Medicine

## 2019-06-18 ENCOUNTER — Ambulatory Visit
Admission: RE | Admit: 2019-06-18 | Discharge: 2019-06-18 | Disposition: A | Discharge status: Home / Self Care | Payer: Medicare Other | Source: Ambulatory Visit | Attending: Internal Medicine | Admitting: Internal Medicine

## 2019-06-18 ENCOUNTER — Other Ambulatory Visit: Payer: Self-pay

## 2019-06-18 DIAGNOSIS — N6312 Unspecified lump in the right breast, upper inner quadrant: Secondary | ICD-10-CM | POA: Diagnosis not present

## 2019-06-18 DIAGNOSIS — C50311 Malignant neoplasm of lower-inner quadrant of right female breast: Secondary | ICD-10-CM | POA: Diagnosis not present

## 2019-06-18 DIAGNOSIS — N6314 Unspecified lump in the right breast, lower inner quadrant: Secondary | ICD-10-CM | POA: Diagnosis not present

## 2019-06-18 DIAGNOSIS — N6310 Unspecified lump in the right breast, unspecified quadrant: Secondary | ICD-10-CM

## 2019-06-18 DIAGNOSIS — Z170 Estrogen receptor positive status [ER+]: Secondary | ICD-10-CM | POA: Diagnosis not present

## 2019-06-18 DIAGNOSIS — N6011 Diffuse cystic mastopathy of right breast: Secondary | ICD-10-CM | POA: Diagnosis not present

## 2019-06-18 IMAGING — MG MM CLIP PLACEMENT
9 of 17 series · 9 of 33 positions shown · non-contrast
Comparison: Previous exam(s).

CLINICAL DATA: Status post stereotactic guided core biopsy of
asymmetry and mass in the RIGHT breast.

EXAM:
DIAGNOSTIC RIGHT MAMMOGRAM POST STEREOTACTIC BIOPSY x2 sites

[R (1 of 9)]
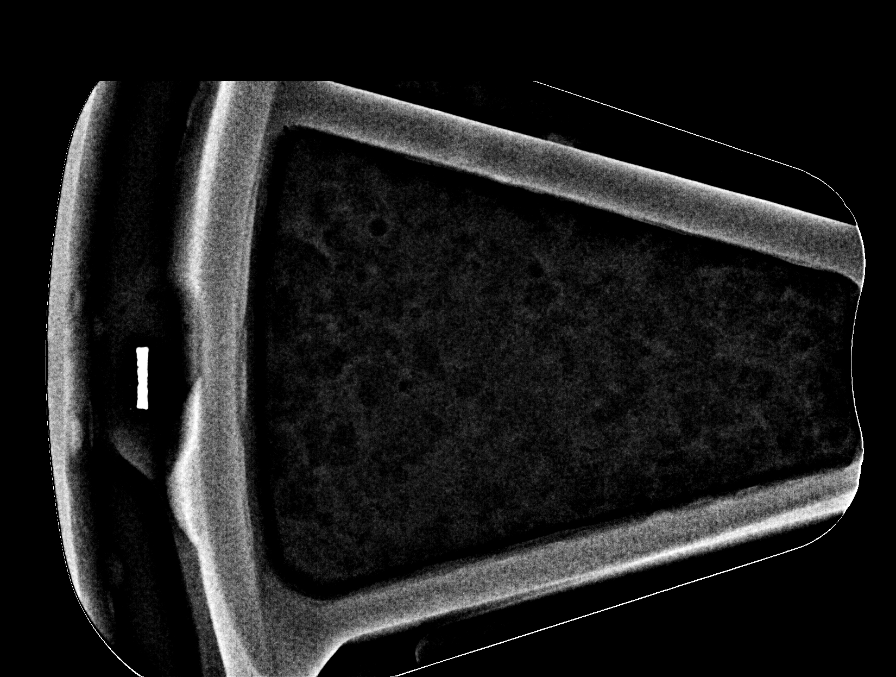

[R (2 of 9)]
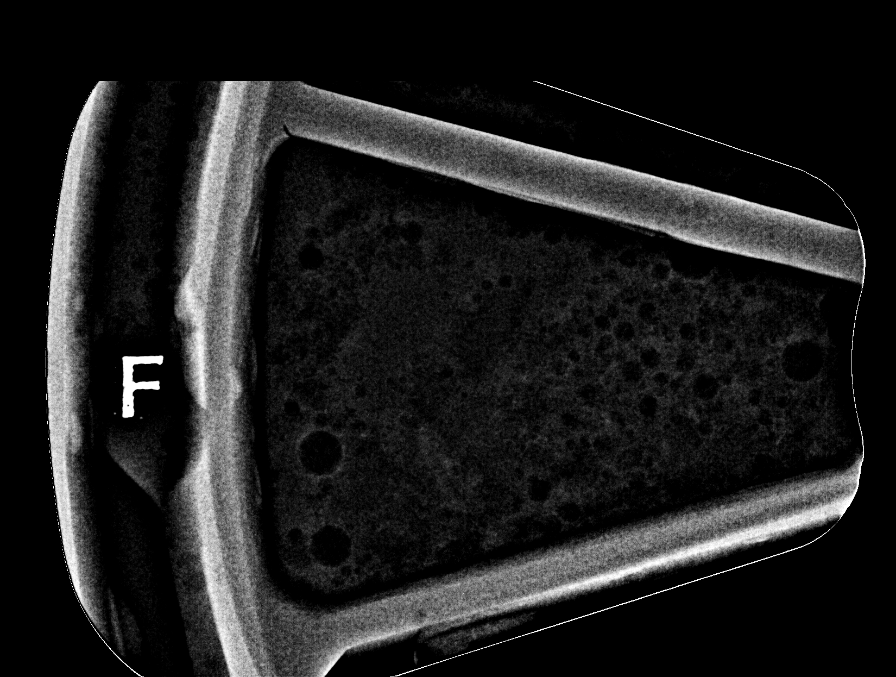

[R (3 of 9)]
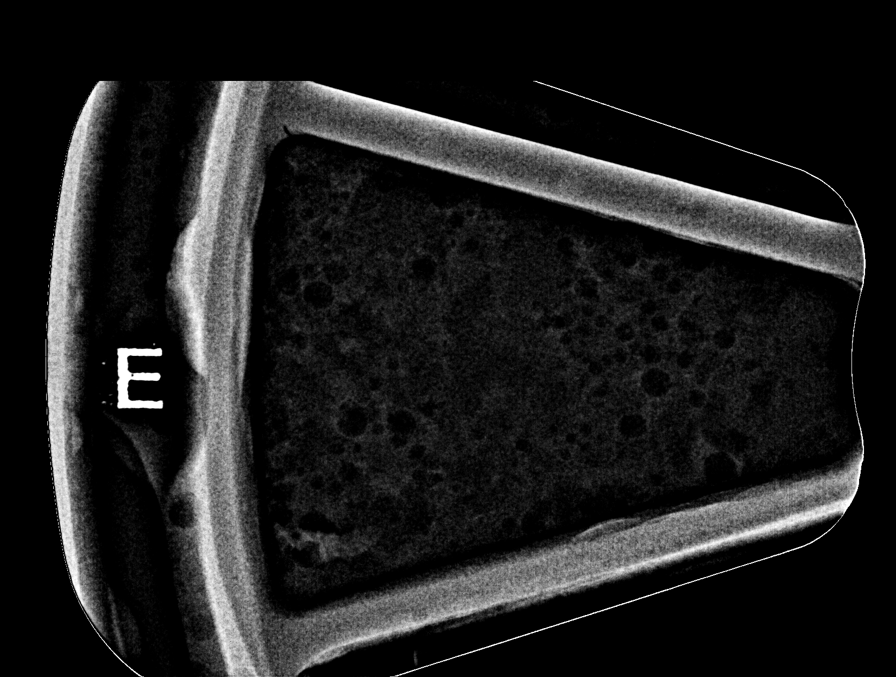

[R (4 of 9)]
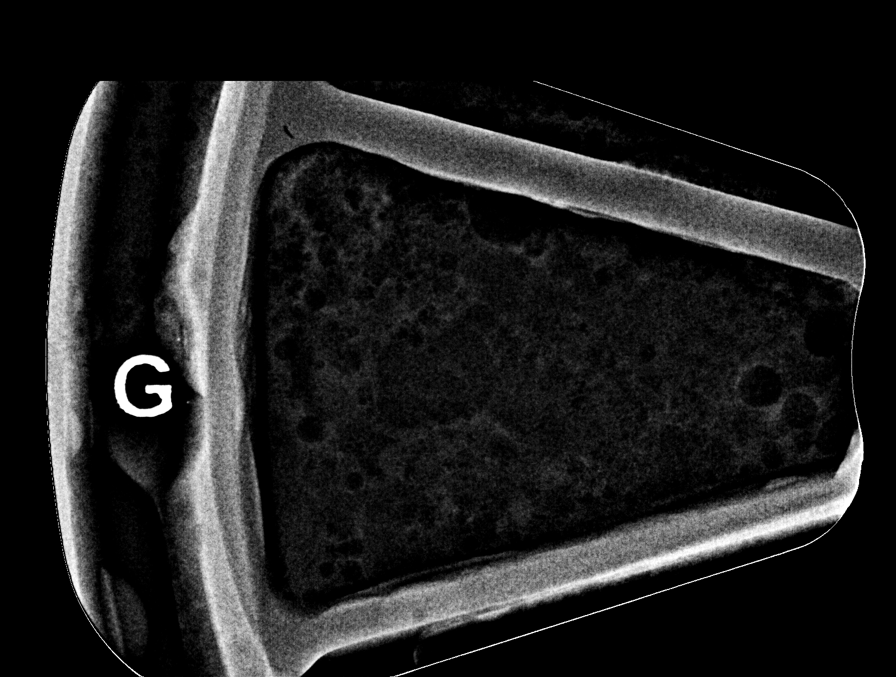

[R (5 of 9)]
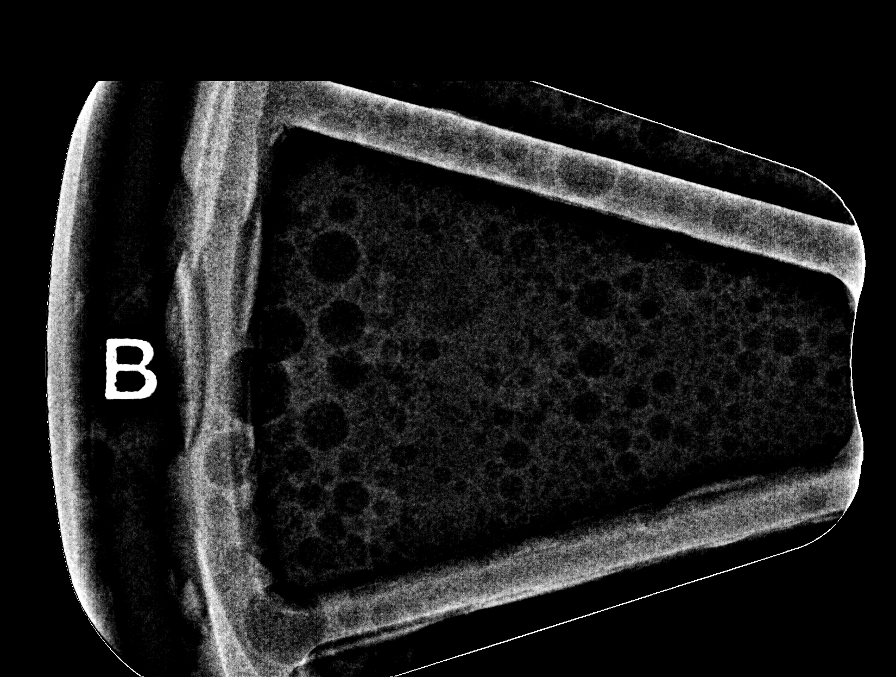

[R (6 of 9)]
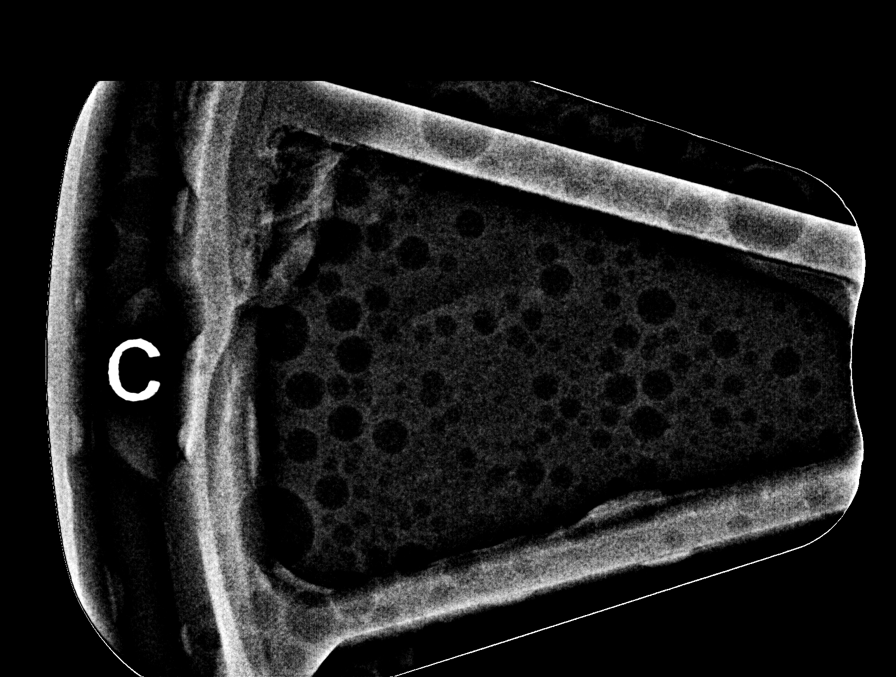

[R (7 of 9)]
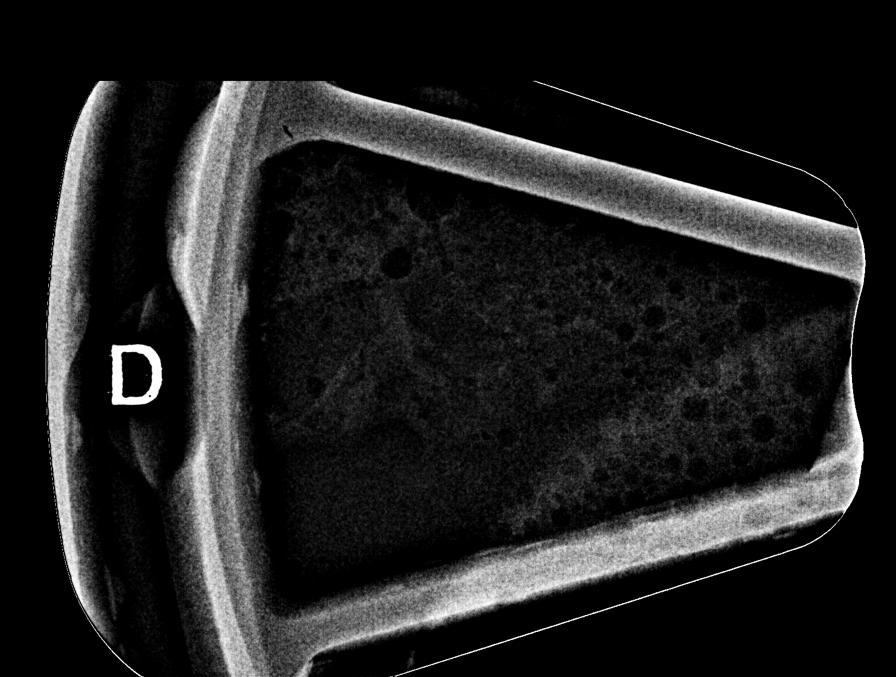

[R (8 of 9)]
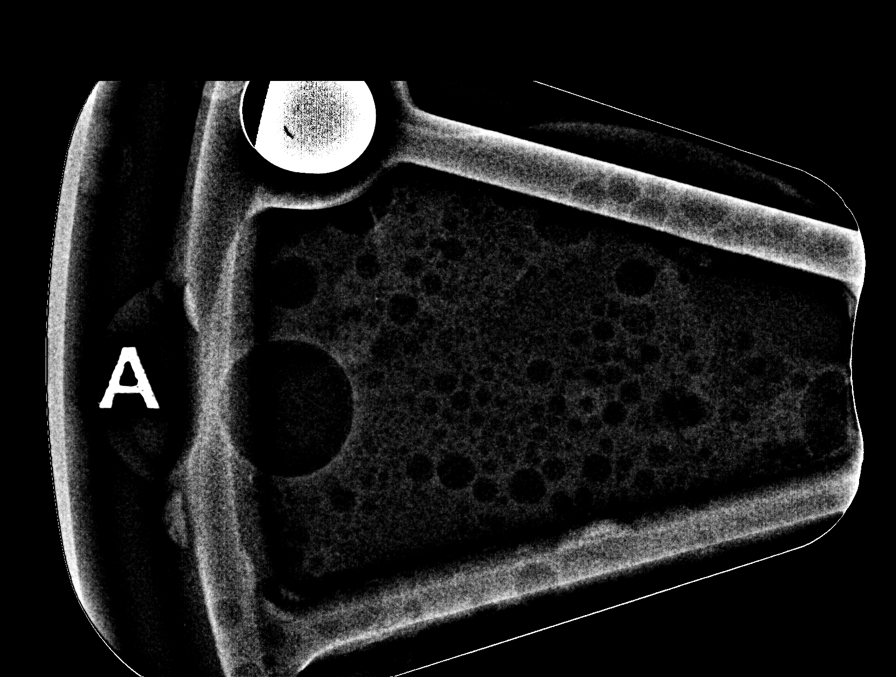

[R (9 of 9)]
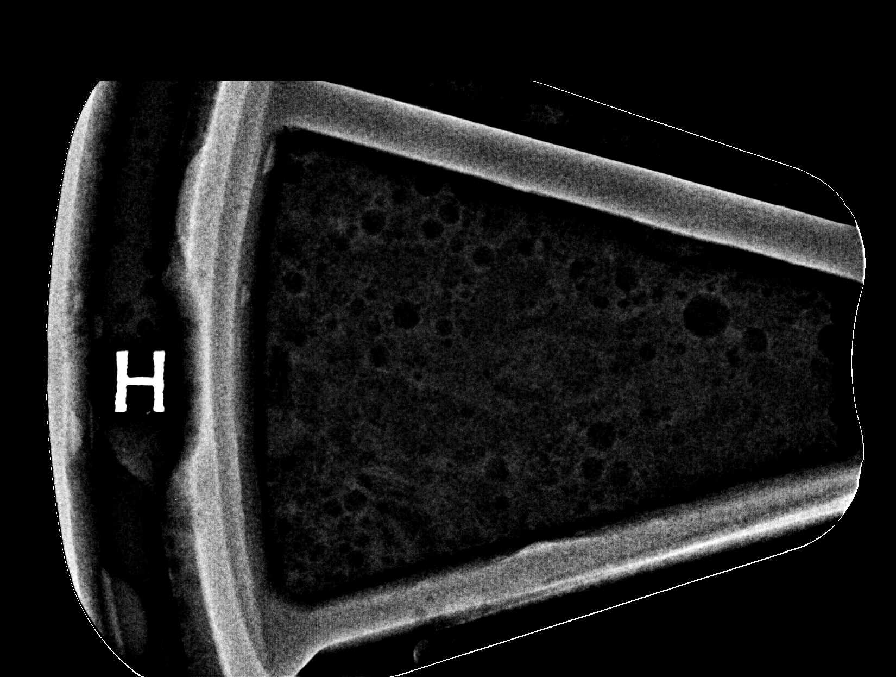

[9 of 33 positions shown; findings below may reference images not displayed]

FINDINGS: Mammographic images were obtained following stereotactic guided
biopsy of mass in the MEDIAL aspect of the RIGHT breast. The initial
clip films demonstrate the coil shaped clip within an asymmetry in
the MEDIAL aspect of the RIGHT breast but discrete from the intended
target of mass further medially in the RIGHT breast. Therefore, an
additional biopsy was performed on the same day with a cranial
caudal approach and placement of an X shaped clip.

Following the second biopsy and placement of an X shaped clip, the
clip is 1.8 centimeters inferior to the mass on the true lateral
projection. Gas is identified beyond the mass, along the trajectory
of the mass and clip.
IMPRESSION: X shaped clip is 1.8 centimeters inferior to the biopsied mass.

Coil shaped clip marks targeted asymmetry also in the MEDIAL aspect
of the RIGHT breast.

Final Assessment: Post Procedure Mammograms for Marker Placement

## 2019-06-18 IMAGING — MG STEREOTACTIC VACUUM ASSIST RIGHT
6 of 17 series · 6 of 40 positions shown · non-contrast
Comparison: Previous exams.
COMPARISON: Previous exams.
COMPARISON: Previous exams.

Addendum:
CLINICAL DATA: The patient presents for stereotactic guided core
biopsy of a RIGHT breast mass.

EXAM:
RIGHT BREAST STEREOTACTIC CORE NEEDLE BIOPSY

[L (1 of 6)]
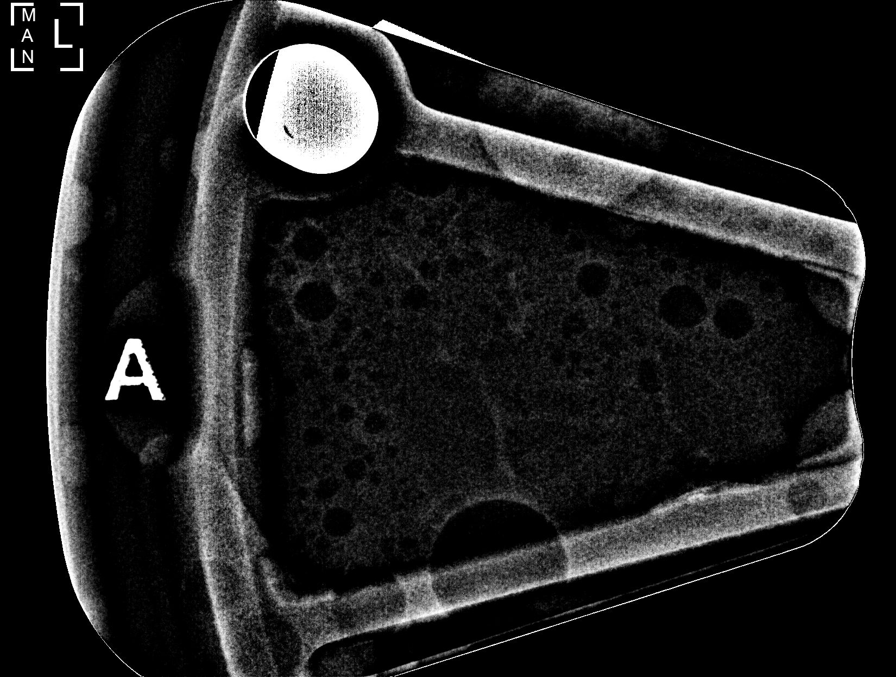

[L (2 of 6)]
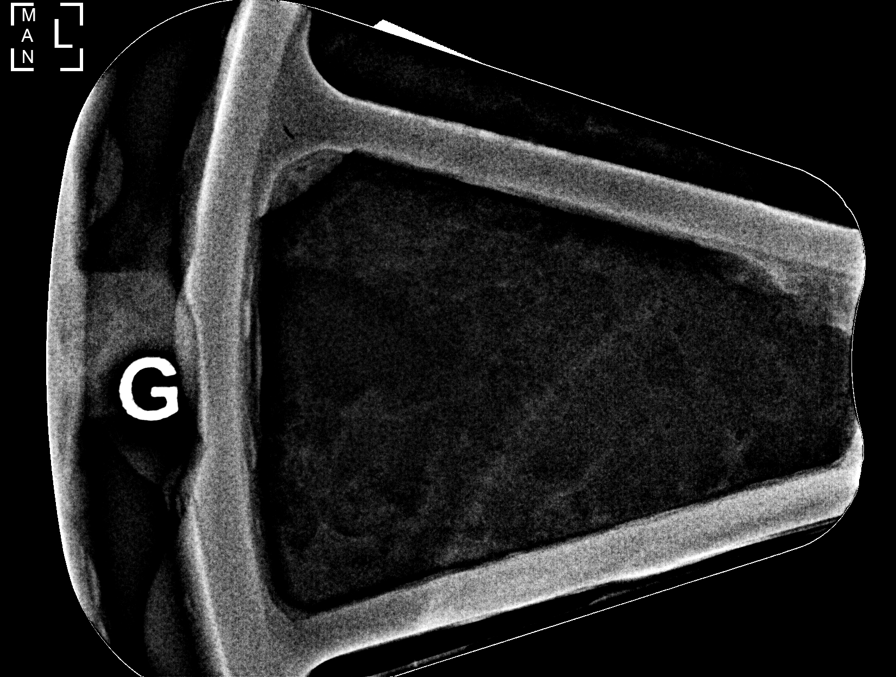

[L (3 of 6)]
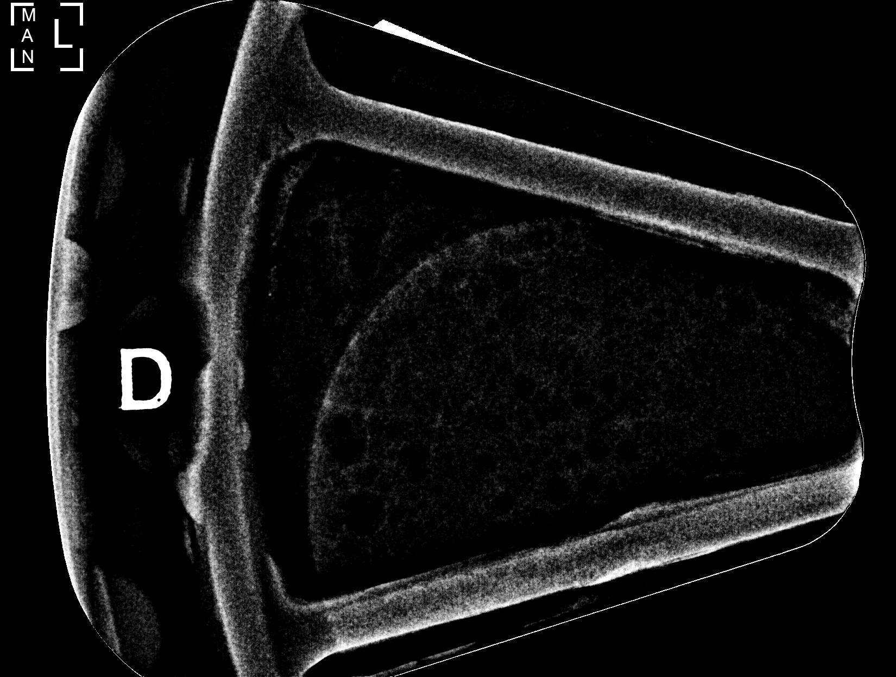

[L (4 of 6)]
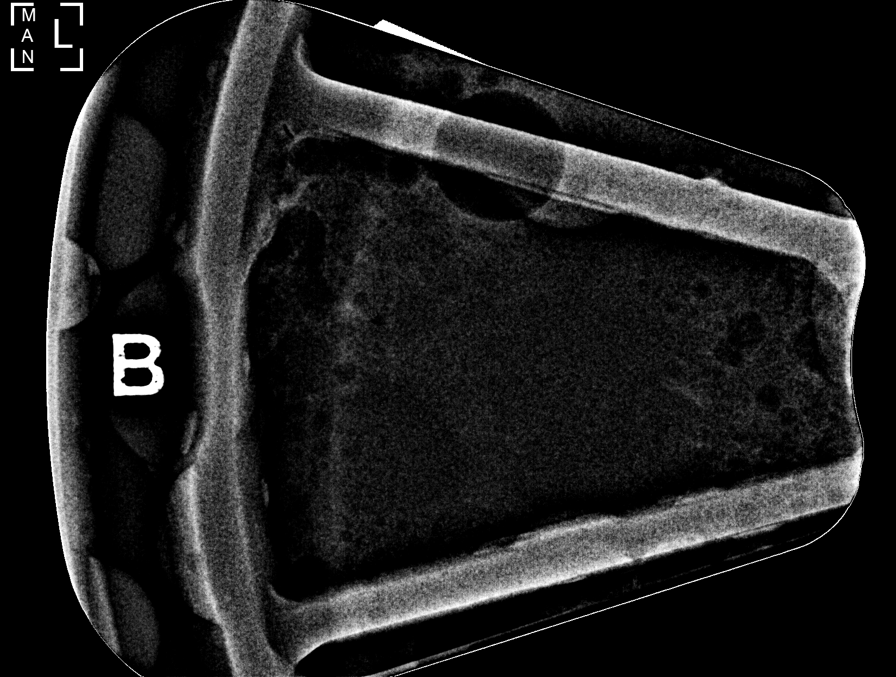

[L (5 of 6)]
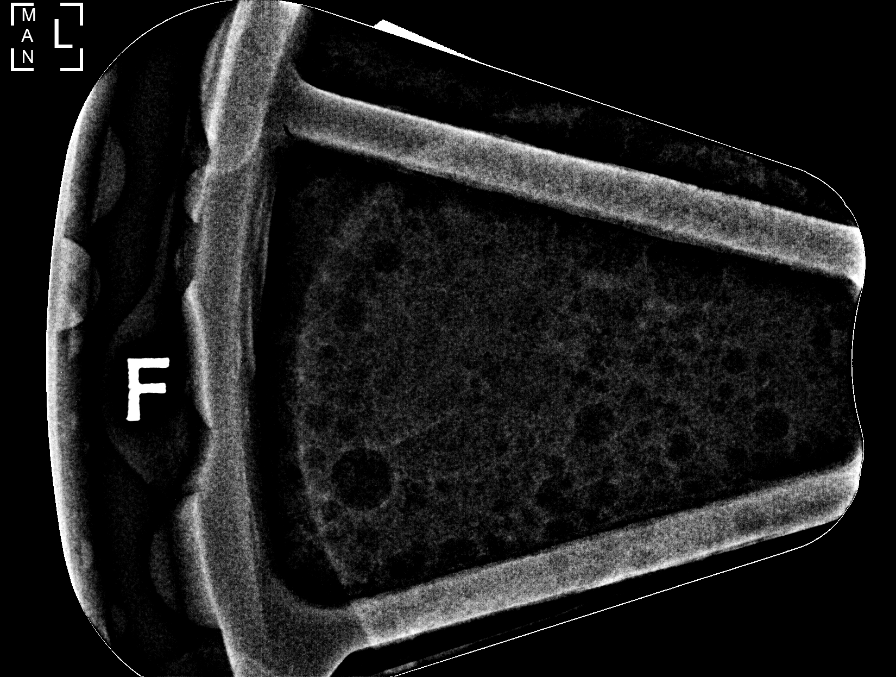

[L (6 of 6)]
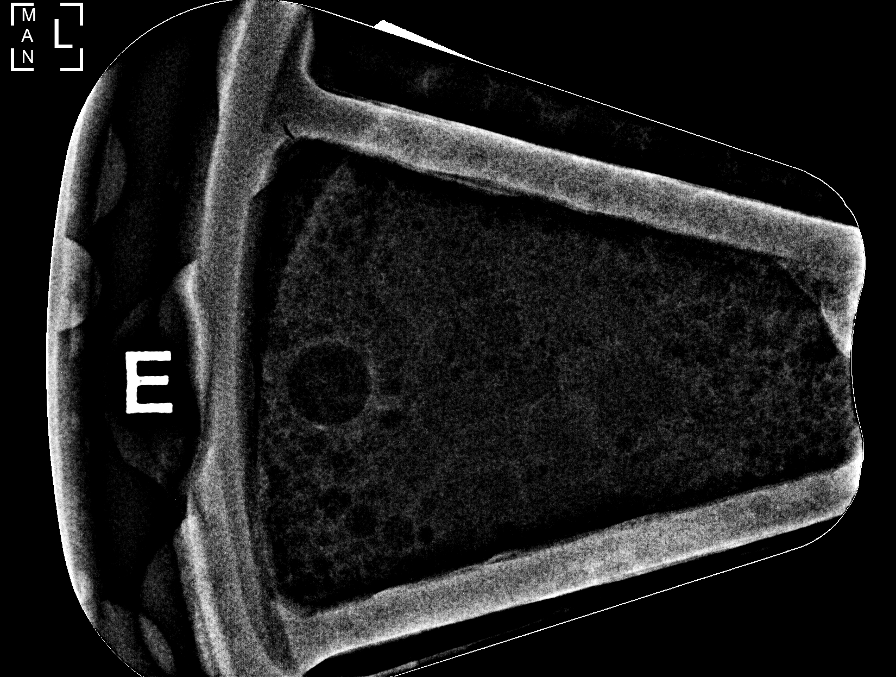

[6 of 40 positions shown; findings below may reference images not displayed]



Site 1: Lesion quadrant: UPPER INNER QUADRANT RIGHT breast. Coil
clip

Initially the mass was targeted from a MEDIAL approach. Using
sterile technique and 1% Lidocaine as local anesthetic, under
stereotactic guidance, a 9 gauge vacuum assisted device was used to
perform core needle biopsy of asymmetry in the MEDIAL aspect of the
RIGHT breast using a MEDIAL approach. A coil shaped clip was placed
at the biopsy site.

On review of clip films, the asymmetry targeted with a MEDIAL
approach is discordant with the intended target in the more MEDIAL
aspect of the breast. Therefore, a second biopsy is performed on the
same day.

Site 2 lesion QUADRANT: LOWER INNER QUADRANT RIGHT breast. X clip

The lesion in the MEDIAL aspect of the RIGHT breast was then
targeted from a craniocaudal approach. Using sterile technique and
1% lidocaine as local anesthetic, under stereotactic guidance, a 9
gauge vacuum assisted device was used to perform core needle biopsy
of mass in the LOWER INNER QUADRANT of the RIGHT breast. An X shaped
clip was placed at the biopsy site.

Follow-up 2-view mammogram was performed and dictated separately.
IMPRESSION: Stereotactic-guided biopsy of asymmetry and mass within the RIGHT
breast. No apparent complications.

ADDENDUM:
Pathology revealed LOBULAR NEOPLASIA (ATYPICAL LOBULAR HYPERPLASIA)
FIBROCYSTIC CHANGES of the RIGHT breast, upper inner quadrant, coil
clip. This was found to be discordant by Dr. BAUMANN, with
excision recommended.

Pathology revealed GRADE II INVASIVE LOBULAR CARCINOMA of the RIGHT
breast, lower inner quadrant, x clip. This was found to be
concordant by Dr. BAUMANN.

Pathology results were discussed with the patient by telephone. The
patient reported doing well after the biopsy with tenderness at the
site. Post biopsy instructions and care were reviewed and questions
were answered. The patient was encouraged to call The [REDACTED]

Surgical consultation has been arranged with Dr. BAUMANN at
[REDACTED] on [DATE], at the request of the
patient.

Pathology results reported by BAUMANN, RN on [DATE].

ADDENDUM:
Surgical consultation has been arranged with Dr. BAUMANN at
[HOSPITAL] on [DATE], at the request
of the patient.

BAUMANN, RN on [DATE].

*** End of Addendum ***
Addendum:
FINDINGS: The patient and I discussed the procedure of stereotactic-guided
biopsy including benefits and alternatives. We discussed the high
likelihood of a successful procedure. We discussed the risks of the
procedure including infection, bleeding, tissue injury, clip
migration, and inadequate sampling. Informed written consent was
given. The usual time out protocol was performed immediately prior
to the procedure.

Site 1: Lesion quadrant: UPPER INNER QUADRANT RIGHT breast. Coil
clip

Initially the mass was targeted from a MEDIAL approach. Using
sterile technique and 1% Lidocaine as local anesthetic, under
stereotactic guidance, a 9 gauge vacuum assisted device was used to
perform core needle biopsy of asymmetry in the MEDIAL aspect of the
RIGHT breast using a MEDIAL approach. A coil shaped clip was placed
at the biopsy site.

On review of clip films, the asymmetry targeted with a MEDIAL
approach is discordant with the intended target in the more MEDIAL
aspect of the breast. Therefore, a second biopsy is performed on the
same day.

Site 2 lesion QUADRANT: LOWER INNER QUADRANT RIGHT breast. X clip

The lesion in the MEDIAL aspect of the RIGHT breast was then
targeted from a craniocaudal approach. Using sterile technique and
1% lidocaine as local anesthetic, under stereotactic guidance, a 9
gauge vacuum assisted device was used to perform core needle biopsy
of mass in the LOWER INNER QUADRANT of the RIGHT breast. An X shaped
clip was placed at the biopsy site.

Follow-up 2-view mammogram was performed and dictated separately.
IMPRESSION: Stereotactic-guided biopsy of asymmetry and mass within the RIGHT
breast. No apparent complications.

ADDENDUM:
Pathology revealed LOBULAR NEOPLASIA (ATYPICAL LOBULAR HYPERPLASIA)
FIBROCYSTIC CHANGES of the RIGHT breast, upper inner quadrant, coil
clip. This was found to be discordant by Dr. BAUMANN, with
excision recommended.

Pathology revealed GRADE II INVASIVE LOBULAR CARCINOMA of the RIGHT
breast, lower inner quadrant, x clip. This was found to be
concordant by Dr. BAUMANN.

Pathology results were discussed with the patient by telephone. The
patient reported doing well after the biopsy with tenderness at the
site. Post biopsy instructions and care were reviewed and questions
were answered. The patient was encouraged to call The [REDACTED]

Surgical consultation has been arranged with Dr. BAUMANN at
[REDACTED] on [DATE], at the request of the
patient.

Pathology results reported by BAUMANN, RN on [DATE].



Site 1: Lesion quadrant: UPPER INNER QUADRANT RIGHT breast. Coil
clip

Initially the mass was targeted from a MEDIAL approach. Using
sterile technique and 1% Lidocaine as local anesthetic, under
stereotactic guidance, a 9 gauge vacuum assisted device was used to
perform core needle biopsy of asymmetry in the MEDIAL aspect of the
RIGHT breast using a MEDIAL approach. A coil shaped clip was placed
at the biopsy site.

On review of clip films, the asymmetry targeted with a MEDIAL
approach is discordant with the intended target in the more MEDIAL
aspect of the breast. Therefore, a second biopsy is performed on the
same day.

Site 2 lesion QUADRANT: LOWER INNER QUADRANT RIGHT breast. X clip

The lesion in the MEDIAL aspect of the RIGHT breast was then
targeted from a craniocaudal approach. Using sterile technique and
1% lidocaine as local anesthetic, under stereotactic guidance, a 9
gauge vacuum assisted device was used to perform core needle biopsy
of mass in the LOWER INNER QUADRANT of the RIGHT breast. An X shaped
clip was placed at the biopsy site.

Follow-up 2-view mammogram was performed and dictated separately.
IMPRESSION: Stereotactic-guided biopsy of asymmetry and mass within the RIGHT
breast. No apparent complications.

## 2019-07-09 ENCOUNTER — Encounter

## 2019-07-09 ENCOUNTER — Other Ambulatory Visit: Payer: Self-pay

## 2019-07-09 ENCOUNTER — Encounter: Payer: Self-pay | Admitting: General Surgery

## 2019-07-09 ENCOUNTER — Encounter (INDEPENDENT_AMBULATORY_CARE_PROVIDER_SITE_OTHER): Payer: Self-pay

## 2019-07-09 ENCOUNTER — Ambulatory Visit (INDEPENDENT_AMBULATORY_CARE_PROVIDER_SITE_OTHER): Payer: Medicare Other | Admitting: General Surgery

## 2019-07-09 VITALS — BP 192/79 | HR 60 | Temp 97.8°F | Resp 16 | Ht 63.0 in | Wt 225.0 lb

## 2019-07-09 DIAGNOSIS — C50911 Malignant neoplasm of unspecified site of right female breast: Secondary | ICD-10-CM | POA: Diagnosis not present

## 2019-07-09 NOTE — Progress Notes (Addendum)
Krista Payne; [id number]; 1941-02-01   HPI Patient is a 78 year old white female who was referred to my care by Dr. Nevada Crane for evaluation treatment of a right breast cancer.  This was found on follow-up mammogram.  A biopsy was performed which revealed invasive lobular carcinoma.  This was found on the inner, lower quadrant of the right breast.  An additional finding of atypical lobular hyperplasia was noted in the upper, inner quadrant of the right breast.  Patient denies any family history of breast cancer.  She denies feeling any breast mass.  No nipple discharge has been noted.  She currently has 0 out of 10 breast pain. Past Medical History:  Diagnosis Date  . Chronic kidney disease   . Hyperlipidemia   . Hypertension   . Osteoarthritis     Past Surgical History:  Procedure Laterality Date  . CATARACT EXTRACTION W/PHACO Right 02/07/2015   Procedure: CATARACT EXTRACTION PHACO AND INTRAOCULAR LENS PLACEMENT (IOC);  Surgeon: Tonny Branch, MD;  Location: [facility];  Service: Ophthalmology;  Laterality: Right;  CDE:12.72  . CATARACT EXTRACTION W/PHACO Left 02/24/2015   Procedure: CATARACT EXTRACTION PHACO AND INTRAOCULAR LENS PLACEMENT LEFT EYE;  Surgeon: Tonny Branch, MD;  Location: [facility];  Service: Ophthalmology;  Laterality: Left;  CDE 11.69  . COLONOSCOPY      History reviewed. No pertinent family history.  Current Outpatient Medications on File Prior to Visit  Medication Sig Dispense Refill  . atenolol (TENORMIN) 25 MG tablet Take by mouth daily.    Marland Kitchen buPROPion (WELLBUTRIN XL) 150 MG 24 hr tablet Take 150 mg by mouth daily.    Marland Kitchen levocetirizine (XYZAL) 5 MG tablet Take 5 mg by mouth every evening.    . Multiple Vitamins-Minerals (PRESERVISION AREDS PO) Take 1 capsule by mouth 2 (two) times daily.    Marland Kitchen olmesartan (BENICAR) 40 MG tablet Take 40 mg by mouth daily.    . simvastatin (ZOCOR) 20 MG tablet Take 1 tablet (20 mg total) by mouth at bedtime. 90 tablet 3  . triamcinolone ointment  (KENALOG) 0.5 % Apply topically 2 (two) times daily. Please give pt a 90 day supply 90 g 1  . ALPRAZolam (XANAX) 0.5 MG tablet Take 0.5 mg by mouth at bedtime as needed for sleep.    Marland Kitchen amitriptyline (ELAVIL) 25 MG tablet Take 1 tablet (25 mg total) by mouth at bedtime. (Patient not taking: Reported on 02/03/2015) 100 tablet 3  . amLODipine (NORVASC) 10 MG tablet Take 1 tablet (10 mg total) by mouth daily. (Patient not taking: Reported on 02/03/2015) 90 tablet 3  . amLODipine (NORVASC) 5 MG tablet Take 5 mg by mouth daily.    Marland Kitchen atenolol (TENORMIN) 25 MG tablet Take 1 tablet (25 mg total) by mouth daily. 90 tablet 3  . Cetirizine HCl 10 MG CAPS Take 1 capsule by mouth daily as needed (allergies).     . hydrochlorothiazide (HYDRODIURIL) 12.5 MG tablet Take 1 tablet (12.5 mg total) by mouth daily. (Patient not taking: Reported on 01/25/2015) 90 tablet 3  . losartan (COZAAR) 100 MG tablet Take 1 tablet (100 mg total) by mouth daily. 90 tablet 3  . losartan (COZAAR) 100 MG tablet Take 100 mg by mouth daily.     No current facility-administered medications on file prior to visit.     Allergies  Allergen Reactions  . Bupropion Hcl     REACTION: unspecified  . Sulfamethoxazole     REACTION: unspecified    Social History   Substance  and Sexual Activity  Alcohol Use No    Social History   Tobacco Use  Smoking Status Never Smoker  Smokeless Tobacco Never Used    Review of Systems  Constitutional: Negative.   HENT: Positive for sinus pain.   Eyes: Negative.   Respiratory: Negative.   Cardiovascular: Negative.   Gastrointestinal: Negative.   Genitourinary: Positive for frequency.  Musculoskeletal: Negative.   Skin: Positive for rash.  Neurological: Negative.   Endo/Heme/Allergies: Negative.   Psychiatric/Behavioral: Negative.     Objective   Vitals:   07/09/19 0851  BP: (!) 192/79  Pulse: 60  Resp: 16  Temp: 97.8 F (36.6 C)  SpO2: 95%    Physical Exam Vitals signs  reviewed.  Constitutional:      Appearance: Normal appearance. She is not ill-appearing.  HENT:     Head: Normocephalic and atraumatic.  Neck:     Musculoskeletal: Neck supple. No muscular tenderness.  Cardiovascular:     Rate and Rhythm: Normal rate and regular rhythm.     Heart sounds: Normal heart sounds. No murmur. No friction rub. No gallop.   Pulmonary:     Effort: Pulmonary effort is normal. No respiratory distress.     Breath sounds: Normal breath sounds. No stridor. No wheezing, rhonchi or rales.  Lymphadenopathy:     Cervical: No cervical adenopathy.  Skin:    General: Skin is warm and dry.  Neurological:     Mental Status: She is alert and oriented to person, place, and time.   Breast: No dominant mass, nipple discharge, or dimpling in either breast.  Pendulous breasts noted.  Axilla is negative for palpable nodes.  Pathology and mammography reports reviewed.  ER positive, PR positive, HER-2 negative  Assessment  Invasive lobular carcinoma of the right breast Plan   Ideally from the surgical standpoint, patient needs a right modified radical mastectomy.  She appears to have both invasive carcinoma and atypical lobular cells in 2 different quadrants of the right breast.  She is somewhat resistant to surgical intervention at this time.  Thus, I will refer her to Dr. Delton Coombes of oncology for further evaluation and treatment.  She will follow-up with me after his appointment.  This has been scheduled for the patient.

## 2019-07-09 NOTE — Patient Instructions (Signed)
Total or Modified Radical Mastectomy A total mastectomy and a modified radical mastectomy are surgeries that are done as part of treatment for breast cancer. You will have one of those types of surgery. Both types involve removing a breast.  In a total mastectomy (simple mastectomy), all breast tissue including the nipple will be removed.  In a modified radical mastectomy, lymph nodes under the arm will be removed along with the breast and nipple. Some of the lining over the muscle tissues under the breast may also be removed. These procedures may also be used to help prevent breast cancer. A preventive (prophylactic) mastectomy may be done if you are at an increased risk of breast cancer due to harmful changes (mutations) in certain genes (BRCA genes). In that case, the procedure involves removing both of your breasts. This can reduce your risk of developing breast cancer in the future. For a transgender person, a total mastectomy may be done as part of a surgical transition from female to female. Let your health care provider know about:  Any allergies you have.  All medicines you are taking, including vitamins, herbs, eye drops, creams, and over-the-counter medicines.  Any problems you or family members have had with anesthetic medicines.  Any blood disorders you have.  Any surgeries you have had.  Any medical conditions you have.  Whether you are pregnant or may be pregnant. What are the risks? Generally, this is a safe procedure. However, problems may occur, including:  Pain.  Infection.  Bleeding.  Allergic reactions to medicines.  Scar tissue.  Chest numbness on the side of the surgery.  Fluid buildup under the skin flaps where your breast was removed (seroma).  Sensation of throbbing or tingling.  Stress or sadness from losing your breast. If you have the lymph nodes under your arm removed, you may have arm swelling, weakness, or numbness on the same side of your body  as your surgery. What happens before the procedure? Staying hydrated Follow instructions from your health care provider about hydration, which may include:  Up to 2 hours before the procedure - you may continue to drink clear liquids, such as water, clear fruit juice, black coffee, and plain tea. Eating and drinking restrictions Follow instructions from your health care provider about eating and drinking, which may include:  8 hours before the procedure - stop eating heavy meals or foods such as meat, fried foods, or fatty foods.  6 hours before the procedure - stop eating light meals or foods, such as toast or cereal.  6 hours before the procedure - stop drinking milk or drinks that contain milk.  2 hours before the procedure - stop drinking clear liquids. Medicines  Ask your health care provider about: ? Changing or stopping your regular medicines. This is especially important if you are taking diabetes medicines or blood thinners. ? Taking medicines such as aspirin and ibuprofen. These medicines can thin your blood. Do not take these medicines unless your health care provider tells you to take them. ? Taking over-the-counter medicines, vitamins, herbs, and supplements.  Your health care team may give you antibiotic medicine to help prevent infection. General instructions  You may be checked for extra fluid around your lymph nodes (lymphedema).  Plan to have someone take you home from the hospital or clinic.  Plan to have a responsible adult care for you for at least 24 hours after you leave the hospital or clinic. This is important.  Ask your health care provider how   your surgical site will be marked or identified.  You may be asked to shower with a germ-killing soap. What happens during the procedure?   To lower your risk of infection: ? Your health care team will wash or sanitize their hands. ? Your skin will be washed with soap.  An IV will be inserted into one of your  veins.  You will be given a medicine to make you fall asleep (general anesthetic).  A wide incision will be made around your nipple. The skin and nipple inside the incision will be removed along with all breast tissue.  If you are having a modified radical mastectomy: ? The lining over your chest muscles will be removed. ? The incision may be extended to reach the lymph nodes under your arm, or a second incision may be made. ? Lymph nodes will be removed.  Breast tissue and lymph nodes that are removed will be sent to the lab for testing.  You may have a drainage tube inserted into your incision to collect fluid that builds up after surgery. This tube will be connected to a suction bulb on the outside of your body to remove the fluid.  Your incision or incisions will be closed with stitches (sutures).  A bandage (dressing) will be placed over your breast area. If lymph nodes were removed, a dressing will also be placed under your arm. The procedure may vary among health care providers and hospitals. What happens after the procedure?  Your blood pressure, heart rate, breathing rate, and blood oxygen level will be monitored until the medicines you were given have worn off.  You will be given pain medicine as needed.  You will be encouraged to get up and walk as soon as you can.  Your IV can be removed when you are able to eat and drink.  You may have a drainage tube in place for 2-3 days to prevent a collection of blood (hematoma) from developing in the breast area. You will be given instructions about caring for the drain before you go home.  A pressure bandage may be applied for 1-2 days to prevent bleeding or swelling. Ask your health care provider how to care for your pressure bandage at home. Summary  In a total mastectomy (simple mastectomy), all breast tissue including the nipple will be removed. In a modified radical mastectomy, the lymph nodes under the arm will be removed  along with the breast and nipple.  Before the procedure, follow instructions from your health care provider about eating and drinking, and ask about changing or stopping your regular medicines.  You will be given a medicine to make you fall asleep (general anesthetic) during the procedure. This information is not intended to replace advice given to you by your health care provider. Make sure you discuss any questions you have with your health care provider. Document Released: 07/10/2001 Document Revised: 12/19/2018 Document Reviewed: 07/19/2017 Elsevier Patient Education  2020 [employer].

## 2019-07-15 ENCOUNTER — Encounter (HOSPITAL_COMMUNITY): Payer: Self-pay

## 2019-07-15 DIAGNOSIS — D0511 Intraductal carcinoma in situ of right breast: Secondary | ICD-10-CM | POA: Diagnosis not present

## 2019-07-15 DIAGNOSIS — N183 Chronic kidney disease, stage 3 (moderate): Secondary | ICD-10-CM | POA: Diagnosis not present

## 2019-07-15 DIAGNOSIS — R600 Localized edema: Secondary | ICD-10-CM | POA: Diagnosis not present

## 2019-07-15 DIAGNOSIS — I1310 Hypertensive heart and chronic kidney disease without heart failure, with stage 1 through stage 4 chronic kidney disease, or unspecified chronic kidney disease: Secondary | ICD-10-CM | POA: Diagnosis not present

## 2019-07-15 DIAGNOSIS — E1122 Type 2 diabetes mellitus with diabetic chronic kidney disease: Secondary | ICD-10-CM | POA: Diagnosis not present

## 2019-07-15 DIAGNOSIS — E875 Hyperkalemia: Secondary | ICD-10-CM | POA: Diagnosis not present

## 2019-07-15 DIAGNOSIS — L97919 Non-pressure chronic ulcer of unspecified part of right lower leg with unspecified severity: Secondary | ICD-10-CM | POA: Diagnosis not present

## 2019-07-16 ENCOUNTER — Other Ambulatory Visit: Payer: Self-pay

## 2019-07-16 ENCOUNTER — Inpatient Hospital Stay (HOSPITAL_COMMUNITY): Payer: Medicare Other | Attending: Hematology | Admitting: Hematology

## 2019-07-16 ENCOUNTER — Encounter (HOSPITAL_COMMUNITY): Payer: Self-pay | Admitting: Hematology

## 2019-07-16 VITALS — BP 191/54 | HR 74 | Temp 99.1°F | Resp 18 | Ht 63.0 in | Wt 230.4 lb

## 2019-07-16 DIAGNOSIS — Z780 Asymptomatic menopausal state: Secondary | ICD-10-CM

## 2019-07-16 DIAGNOSIS — I129 Hypertensive chronic kidney disease with stage 1 through stage 4 chronic kidney disease, or unspecified chronic kidney disease: Secondary | ICD-10-CM | POA: Insufficient documentation

## 2019-07-16 DIAGNOSIS — N189 Chronic kidney disease, unspecified: Secondary | ICD-10-CM | POA: Insufficient documentation

## 2019-07-16 DIAGNOSIS — R200 Anesthesia of skin: Secondary | ICD-10-CM | POA: Diagnosis not present

## 2019-07-16 DIAGNOSIS — C50911 Malignant neoplasm of unspecified site of right female breast: Secondary | ICD-10-CM | POA: Diagnosis not present

## 2019-07-16 DIAGNOSIS — H3530 Unspecified macular degeneration: Secondary | ICD-10-CM | POA: Insufficient documentation

## 2019-07-16 DIAGNOSIS — Z170 Estrogen receptor positive status [ER+]: Secondary | ICD-10-CM | POA: Diagnosis not present

## 2019-07-16 DIAGNOSIS — Z79899 Other long term (current) drug therapy: Secondary | ICD-10-CM | POA: Insufficient documentation

## 2019-07-16 DIAGNOSIS — E785 Hyperlipidemia, unspecified: Secondary | ICD-10-CM | POA: Diagnosis not present

## 2019-07-16 DIAGNOSIS — M8580 Other specified disorders of bone density and structure, unspecified site: Secondary | ICD-10-CM | POA: Insufficient documentation

## 2019-07-16 DIAGNOSIS — M1990 Unspecified osteoarthritis, unspecified site: Secondary | ICD-10-CM | POA: Diagnosis not present

## 2019-07-16 DIAGNOSIS — C50211 Malignant neoplasm of upper-inner quadrant of right female breast: Secondary | ICD-10-CM | POA: Diagnosis not present

## 2019-07-16 NOTE — Patient Instructions (Addendum)
[place] at [facility] Discharge Instructions  You were seen today by Dr. Delton Coombes. He went over your history, family history and how you've been feeling lately. He discussed your cancer with you and the possible treatments you may receive. He discussed lumpectomy versus mastectomy. He would like to schedule your for a MRI of your breast for further evaluation. He discussed a pill that you could take for your cancer and the side effects you could have. He will see you back after your scan for follow up.   Thank you for choosing [place] at [facility] to provide your oncology and hematology care.  To afford each patient quality time with our provider, please arrive at least 15 minutes before your scheduled appointment time.   If you have a lab appointment with the [facility] please come in thru the  Main Entrance and check in at the main information desk  You need to re-schedule your appointment should you arrive 10 or more minutes late.  We strive to give you quality time with our providers, and arriving late affects you and other patients whose appointments are after yours.  Also, if you no show three or more times for appointments you may be dismissed from the clinic at the providers discretion.     Again, thank you for choosing [facility].  Our hope is that these requests will decrease the amount of time that you wait before being seen by our physicians.       _____________________________________________________________  Should you have questions after your visit to [facility], please contact our office at [phone] between the hours of 8:00 a.m. and 4:30 p.m.  Voicemails left after 4:00 p.m. will not be returned until the following business day.  For prescription refill requests, have your pharmacy contact our office and allow 72 hours.    Cancer Center Support Programs:   > Cancer Support Group  2nd  Tuesday of the month 1pm-2pm, Journey Room

## 2019-07-16 NOTE — Progress Notes (Signed)
[facility] CONSULT NOTE  Patient Care Team: Celene Squibb, MD as PCP - General (Internal Medicine)  CHIEF COMPLAINTS/PURPOSE OF CONSULTATION:  Newly diagnosed right breast cancer  HISTORY OF PRESENTING ILLNESS:  Krista Payne 78 y.o. female is seen in consultation today at the request of Dr. Arnoldo Morale for newly diagnosed right breast lobular invasive carcinoma.  She had an abnormal mammogram on 06/09/2019 which showed suspicious mass in the medial right breast with no sonographic correlation.  Ultrasound did not reveal any adenopathy.  Left breast did not show any suspicious masses.  She underwent stereotactic biopsy of right breast lower inner quadrant which showed invasive lobular carcinoma.  Biopsy of the upper inner quadrant of the right breast showed atypical lobular hyperplasia with fibrocystic changes.  Invasive lobular carcinoma was 90% ER/PR positive and HER-2 negative.  Ki-67 was 5%.  Dr. Arnoldo Morale has recommended mastectomy.  Patient does not want to have any surgery done.  She is here to talk about nonsurgical options.  She lives by herself and is able to do all her ADLs and IADLs.  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  She was never smoker.  No family history of malignancies.  She has 2 grown children.  I reviewed her records extensively and collaborated the history with the patient.  SUMMARY OF ONCOLOGIC HISTORY: Oncology History   No history exists.    In terms of breast cancer risk profile:  She menarched at early age of 58 and went to menopause at age 22 She had 2 pregnancies, her first child was born at age 59  She did not use birth control pills. She was never exposed to fertility medications or hormone replacement therapy.  She has no family history of Breast/GYN/GI cancer  MEDICAL HISTORY:  Past Medical History:  Diagnosis Date  . Chronic kidney disease   . Hyperlipidemia   .  Hypertension   . Macular degeneration   . Osteoarthritis     SURGICAL HISTORY: Past Surgical History:  Procedure Laterality Date  . CATARACT EXTRACTION W/PHACO Right 02/07/2015   Procedure: CATARACT EXTRACTION PHACO AND INTRAOCULAR LENS PLACEMENT (IOC);  Surgeon: Tonny Branch, MD;  Location: [facility]S;  Service: Ophthalmology;  Laterality: Right;  CDE:12.72  . CATARACT EXTRACTION W/PHACO Left 02/24/2015   Procedure: CATARACT EXTRACTION PHACO AND INTRAOCULAR LENS PLACEMENT LEFT EYE;  Surgeon: Tonny Branch, MD;  Location: [facility];  Service: Ophthalmology;  Laterality: Left;  CDE 11.69  . COLONOSCOPY      SOCIAL HISTORY: Social History   Socioeconomic History  . Marital status: Widowed    Spouse name: Not on file  . Number of children: 3  . Years of education: Not on file  . Highest education level: Not on file  Occupational History  . Not on file  Social Needs  . Financial resource strain: Not hard at all  . Food insecurity    Worry: Never true    Inability: Never true  . Transportation needs    Medical: No    Non-medical: No  Tobacco Use  . Smoking status: Never Smoker  . Smokeless tobacco: Never Used  Substance and Sexual Activity  . Alcohol use: No  . Drug use: No  . Sexual activity: Never  Lifestyle  . Physical activity    Days per week: 0 days    Minutes per session: 0 min  . Stress: Not at all  Relationships  .  Social connections    Talks on phone: More than three times a week    Gets together: More than three times a week    Attends religious service: More than 4 times per year    Active member of club or organization: No    Attends meetings of clubs or organizations: Never    Relationship status: Widowed  . Intimate partner violence    Fear of current or ex partner: No    Emotionally abused: No    Physically abused: No    Forced sexual activity: No  Other Topics Concern  . Not on file  Social History Narrative  . Not on file    FAMILY HISTORY: Family  History  Problem Relation Age of Onset  . Macular degeneration Mother   . Diabetes Father   . Heart disease Daughter     ALLERGIES:  is allergic to bupropion hcl and sulfamethoxazole.  MEDICATIONS:  Current Outpatient Medications  Medication Sig Dispense Refill  . ALPRAZolam (XANAX) 0.5 MG tablet Take 0.5 mg by mouth at bedtime as needed for sleep.    Marland Kitchen amLODipine (NORVASC) 5 MG tablet Take 5 mg by mouth daily.    Marland Kitchen atenolol (TENORMIN) 25 MG tablet Take by mouth daily.    Marland Kitchen buPROPion (WELLBUTRIN XL) 150 MG 24 hr tablet Take 150 mg by mouth daily.    Marland Kitchen levocetirizine (XYZAL) 5 MG tablet Take 5 mg by mouth every evening.    . Melatonin 5 MG SUBL Place 1 tablet under the tongue every evening. PRN    . Multiple Vitamins-Minerals (PRESERVISION AREDS PO) Take 1 capsule by mouth 2 (two) times daily.    Marland Kitchen olmesartan (BENICAR) 40 MG tablet Take 40 mg by mouth daily.    . simvastatin (ZOCOR) 20 MG tablet Take 1 tablet (20 mg total) by mouth at bedtime. 90 tablet 3  . triamcinolone ointment (KENALOG) 0.5 % Apply topically 2 (two) times daily. Please give pt a 90 day supply (Patient not taking: Reported on 07/15/2019) 90 g 1   No current facility-administered medications for this visit.     REVIEW OF SYSTEMS:   Constitutional: Denies fevers, chills or abnormal night sweats Eyes: Denies blurriness of vision, double vision or watery eyes Ears, nose, mouth, throat, and face: Denies mucositis or sore throat Respiratory: Denies cough, dyspnea or wheezes Cardiovascular: Denies palpitation, chest discomfort.  Positive for leg swelling. Gastrointestinal:  Denies nausea, heartburn or change in bowel habits Skin: Denies abnormal skin rashes Lymphatics: Denies new lymphadenopathy or easy bruising Neurological:Denies numbness, tingling or new weaknesses Behavioral/Psych: Mood is stable, no new changes  Breast:  Denies any palpable lumps or discharge All other systems were reviewed with the patient and  are negative.  PHYSICAL EXAMINATION: ECOG PERFORMANCE STATUS: 1 - Symptomatic but completely ambulatory  Vitals:   07/16/19 1403  BP: (!) 191/54  Pulse: 74  Resp: 18  Temp: 99.1 F (37.3 C)  SpO2: 96%   Filed Weights   07/16/19 1403  Weight: 230 lb 6.4 oz (104.5 kg)    GENERAL:alert, no distress and comfortable SKIN: skin color, texture, turgor are normal, no rashes or significant lesions EYES: normal, conjunctiva are pink and non-injected, sclera clear OROPHARYNX:no exudate, no erythema and lips, buccal mucosa, and tongue normal  NECK: supple, thyroid normal size, non-tender, without nodularity LYMPH:  no palpable lymphadenopathy in the cervical, axillary or inguinal LUNGS: clear to auscultation and percussion with normal breathing effort HEART: regular rate & rhythm and no murmurs  and no lower extremity edema ABDOMEN:abdomen soft, non-tender and normal bowel sounds Musculoskeletal:no cyanosis of digits and no clubbing  PSYCH: alert & oriented x 3 with fluent speech NEURO: no focal motor/sensory deficits BREAST: No palpable nodules in breast. No palpable axillary or supraclavicular lymphadenopathy (exam performed in the presence of a chaperone)   LABORATORY DATA:  I have reviewed the data as listed Lab Results  Component Value Date   WBC 8.1 02/03/2015   HGB 12.2 02/03/2015   HCT 37.6 02/03/2015   MCV 91.0 02/03/2015   PLT 405 (H) 02/03/2015   Lab Results  Component Value Date   NA 138 02/03/2015   K 4.8 02/03/2015   CL 105 02/03/2015   CO2 26 02/03/2015    RADIOGRAPHIC STUDIES: I have personally reviewed the radiological reports and agreed with the findings in the report.  ASSESSMENT AND PLAN:  Infiltrating lobular carcinoma of right breast in female ([place]) 1.  Right breast lobular carcinoma: - Mammogram on 06/09/2019 showed suspicious mass around 5 mm in the medial right breast with no sonographic correlate.  No lymphadenopathy in the right axilla. -  Stereotactic biopsy on 06/18/2019 showed invasive lobular carcinoma in the right breast lower inner quadrant.  This was 90% positive for estrogen and progesterone.  Ki-67 was 5%.  Tumor is negative for HER-2. - Right breast upper inner quadrant biopsy shows atypical lobular hyperplasia with fibrocystic changes. - Patient met with Dr. Arnoldo Morale who recommended mastectomy. - Patient does not want to have any surgery done.  She is inquiring about options which does not involve surgery. - Physical exam today did not reveal any palpable masses. -I discussed the normal treatment paradigm of breast cancer which involves surgery followed by chemotherapy/radiation followed by antiestrogen therapy. -I talked about the normal behavior of invasive lobular cancers which are typically multifocal and slow-growing. - Patient is not keen on having any surgery done. -I have recommended doing a breast MRI to further delineate her malignancy. -We have also talked about anti-estrogen therapy, which cannot cure her cancer.  I have stressed the fact that surgery is only cure.  I will talk to her again at next visit. -I have ordered bone density test as her previous testing in February 2018 shows a T score of -2.1.   All questions were answered. The patient knows to call the clinic with any problems, questions or concerns.    Derek Jack, MD 07/16/19

## 2019-07-16 NOTE — Assessment & Plan Note (Signed)
1.  Right breast lobular carcinoma: - Mammogram on 06/09/2019 showed suspicious mass around 5 mm in the medial right breast with no sonographic correlate.  No lymphadenopathy in the right axilla. - Stereotactic biopsy on 06/18/2019 showed invasive lobular carcinoma in the right breast lower inner quadrant.  This was 90% positive for estrogen and progesterone.  Ki-67 was 5%.  Tumor is negative for HER-2. - Right breast upper inner quadrant biopsy shows atypical lobular hyperplasia with fibrocystic changes. - Patient met with Dr. Arnoldo Morale who recommended mastectomy. - Patient does not want to have any surgery done.  She is inquiring about options which does not involve surgery. - Physical exam today did not reveal any palpable masses. -I discussed the normal treatment paradigm of breast cancer which involves surgery followed by chemotherapy/radiation followed by antiestrogen therapy. -I talked about the normal behavior of invasive lobular cancers which are typically multifocal and slow-growing. - Patient is not keen on having any surgery done. -I have recommended doing a breast MRI to further delineate her malignancy. -We have also talked about anti-estrogen therapy, which cannot cure her cancer.  I have stressed the fact that surgery is only cure.  I will talk to her again at next visit. -I have ordered bone density test as her previous testing in February 2018 shows a T score of -2.1.

## 2019-07-20 ENCOUNTER — Other Ambulatory Visit: Payer: Self-pay

## 2019-07-20 ENCOUNTER — Ambulatory Visit (HOSPITAL_COMMUNITY)
Admission: RE | Admit: 2019-07-20 | Discharge: 2019-07-20 | Disposition: A | Discharge status: Home / Self Care | Payer: Medicare Other | Source: Ambulatory Visit | Attending: Hematology | Admitting: Hematology

## 2019-07-20 DIAGNOSIS — C50911 Malignant neoplasm of unspecified site of right female breast: Secondary | ICD-10-CM | POA: Diagnosis not present

## 2019-07-20 DIAGNOSIS — Z780 Asymptomatic menopausal state: Secondary | ICD-10-CM | POA: Diagnosis not present

## 2019-07-20 DIAGNOSIS — M85852 Other specified disorders of bone density and structure, left thigh: Secondary | ICD-10-CM | POA: Diagnosis not present

## 2019-07-21 ENCOUNTER — Ambulatory Visit (HOSPITAL_COMMUNITY)
Admission: RE | Admit: 2019-07-21 | Discharge: 2019-07-21 | Disposition: A | Discharge status: Home / Self Care | Payer: Medicare Other | Source: Ambulatory Visit | Attending: Hematology | Admitting: Hematology

## 2019-07-21 ENCOUNTER — Encounter (HOSPITAL_COMMUNITY): Payer: Self-pay

## 2019-07-21 DIAGNOSIS — C50911 Malignant neoplasm of unspecified site of right female breast: Secondary | ICD-10-CM

## 2019-07-22 ENCOUNTER — Encounter (HOSPITAL_COMMUNITY): Payer: Self-pay | Admitting: Hematology

## 2019-07-22 ENCOUNTER — Other Ambulatory Visit: Payer: Self-pay

## 2019-07-22 ENCOUNTER — Inpatient Hospital Stay (HOSPITAL_BASED_OUTPATIENT_CLINIC_OR_DEPARTMENT_OTHER): Payer: Medicare Other | Admitting: Hematology

## 2019-07-22 VITALS — BP 188/63 | HR 66 | Temp 97.6°F | Resp 18 | Wt 229.4 lb

## 2019-07-22 DIAGNOSIS — M8580 Other specified disorders of bone density and structure, unspecified site: Secondary | ICD-10-CM | POA: Diagnosis not present

## 2019-07-22 DIAGNOSIS — C50211 Malignant neoplasm of upper-inner quadrant of right female breast: Secondary | ICD-10-CM | POA: Diagnosis not present

## 2019-07-22 DIAGNOSIS — Z170 Estrogen receptor positive status [ER+]: Secondary | ICD-10-CM | POA: Diagnosis not present

## 2019-07-22 DIAGNOSIS — C50911 Malignant neoplasm of unspecified site of right female breast: Secondary | ICD-10-CM | POA: Diagnosis not present

## 2019-07-22 DIAGNOSIS — R200 Anesthesia of skin: Secondary | ICD-10-CM | POA: Diagnosis not present

## 2019-07-22 DIAGNOSIS — N189 Chronic kidney disease, unspecified: Secondary | ICD-10-CM | POA: Diagnosis not present

## 2019-07-22 DIAGNOSIS — I129 Hypertensive chronic kidney disease with stage 1 through stage 4 chronic kidney disease, or unspecified chronic kidney disease: Secondary | ICD-10-CM | POA: Diagnosis not present

## 2019-07-22 NOTE — Progress Notes (Signed)
[place], [place] [address]   CLINIC:  Medical Oncology/Hematology  PCP:  Celene Squibb, MD [place] [address] [phone]   REASON FOR VISIT:  Follow-up for right breast lobular carcinoma.  CURRENT THERAPY: Under work-up.   INTERVAL HISTORY:  Ms. Oatis 78 y.o. female seen for follow-up of right breast lobular carcinoma.  We have ordered MRI of the right breast at last visit.  Apparently she cannot do it in [place] as the machine is small.  Hence it was postponed.  She did complete DEXA scan which was done on 07/20/2019.  She denies any breast pains.  Numbness in the hands has been stable.  Appetite and energy levels are 100%.  Denies any new onset pains.    REVIEW OF SYSTEMS:  Review of Systems  Neurological: Positive for numbness.  All other systems reviewed and are negative.    PAST MEDICAL/SURGICAL HISTORY:  Past Medical History:  Diagnosis Date  . Chronic kidney disease   . Hyperlipidemia   . Hypertension   . Macular degeneration   . Osteoarthritis    Past Surgical History:  Procedure Laterality Date  . CATARACT EXTRACTION W/PHACO Right 02/07/2015   Procedure: CATARACT EXTRACTION PHACO AND INTRAOCULAR LENS PLACEMENT (IOC);  Surgeon: Tonny Branch, MD;  Location: [facility];  Service: Ophthalmology;  Laterality: Right;  CDE:12.72  . CATARACT EXTRACTION W/PHACO Left 02/24/2015   Procedure: CATARACT EXTRACTION PHACO AND INTRAOCULAR LENS PLACEMENT LEFT EYE;  Surgeon: Tonny Branch, MD;  Location: [facility];  Service: Ophthalmology;  Laterality: Left;  CDE 11.69  . COLONOSCOPY       SOCIAL HISTORY:  Social History   Socioeconomic History  . Marital status: Widowed    Spouse name: Not on file  . Number of children: 3  . Years of education: Not on file  . Highest education level: Not on file  Occupational History  . Not on file  Social Needs  . Financial resource strain: Not hard at all  . Food insecurity    Worry: Never true    Inability: Never true  . Transportation needs    Medical: No    Non-medical: No  Tobacco Use  . Smoking status: Never Smoker  . Smokeless tobacco: Never Used  Substance and Sexual Activity  . Alcohol use: No  . Drug use: No  . Sexual activity: Never  Lifestyle  . Physical activity    Days per week: 0 days    Minutes per session: 0 min  . Stress: Not at all  Relationships  . Social connections    Talks on phone: More than three times a week    Gets together: More than three times a week    Attends religious service: More than 4 times per year    Active member of club or organization: No    Attends meetings of clubs or organizations: Never    Relationship status: Widowed  . Intimate partner violence    Fear of current or ex partner: No    Emotionally abused: No    Physically abused: No    Forced sexual activity: No  Other Topics Concern  . Not on file  Social History Narrative  . Not on file    FAMILY HISTORY:  Family History  Problem Relation Age of Onset  . Macular degeneration Mother   . Diabetes Father   . Heart disease Daughter     CURRENT MEDICATIONS:  Outpatient  Encounter Medications as of 07/22/2019  Medication Sig  . amLODipine (NORVASC) 5 MG tablet Take 5 mg by mouth daily.  Marland Kitchen atenolol (TENORMIN) 25 MG tablet Take by mouth daily.  Marland Kitchen buPROPion (WELLBUTRIN XL) 150 MG 24 hr tablet Take 150 mg by mouth daily.  Marland Kitchen levocetirizine (XYZAL) 5 MG tablet Take 5 mg by mouth every evening.  . Melatonin 5 MG SUBL Place 1 tablet under the tongue every evening. PRN  . Multiple Vitamins-Minerals (PRESERVISION AREDS PO) Take 1 capsule by mouth 2 (two) times daily.  Marland Kitchen olmesartan (BENICAR) 40 MG tablet Take 40 mg by mouth daily.  . simvastatin (ZOCOR) 20 MG tablet Take 1 tablet (20 mg total) by mouth at bedtime.  . sodium zirconium cyclosilicate (LOKELMA) 5 g packet Take 5 g by mouth. One pack per day  . ALPRAZolam (XANAX) 0.5 MG tablet Take 0.5 mg by  mouth at bedtime as needed for sleep.  Marland Kitchen triamcinolone ointment (KENALOG) 0.5 % Apply topically 2 (two) times daily. Please give pt a 90 day supply (Patient not taking: Reported on 07/15/2019)   No facility-administered encounter medications on file as of 07/22/2019.     ALLERGIES:  Allergies  Allergen Reactions  . Bupropion Hcl     REACTION: unspecified  . Sulfamethoxazole     REACTION: unspecified     PHYSICAL EXAM:  ECOG Performance status: 1  Vitals:   07/22/19 1102  BP: (!) 188/63  Pulse: 66  Resp: 18  Temp: 97.6 F (36.4 C)  SpO2: 99%   Filed Weights   07/22/19 1102  Weight: 229 lb 6.4 oz (104.1 kg)    Physical Exam Vitals signs reviewed.  Constitutional:      Appearance: Normal appearance.  Cardiovascular:     Rate and Rhythm: Normal rate and regular rhythm.     Heart sounds: Normal heart sounds.  Pulmonary:     Effort: Pulmonary effort is normal.     Breath sounds: Normal breath sounds.  Abdominal:     General: There is no distension.     Palpations: Abdomen is soft. There is no mass.  Musculoskeletal:        General: No swelling.  Skin:    General: Skin is warm.  Neurological:     General: No focal deficit present.     Mental Status: She is alert and oriented to person, place, and time.  Psychiatric:        Mood and Affect: Mood normal.        Behavior: Behavior normal.    No breast mass palpable.  LABORATORY DATA:  I have reviewed the labs as listed.  CBC    Component Value Date/Time   WBC 8.1 02/03/2015 1130   RBC 4.13 02/03/2015 1130   HGB 12.2 02/03/2015 1130   HCT 37.6 02/03/2015 1130   PLT 405 (H) 02/03/2015 1130   MCV 91.0 02/03/2015 1130   MCH 29.5 02/03/2015 1130   MCHC 32.4 02/03/2015 1130   RDW 13.1 02/03/2015 1130   LYMPHSABS 1.2 11/13/2011 1443   MONOABS 0.7 11/13/2011 1443   EOSABS 0.4 11/13/2011 1443   BASOSABS 0.1 11/13/2011 1443   CMP Latest Ref Rng & Units 02/03/2015 12/26/2011 11/15/2011  Glucose 70 - 99 mg/dL  139(H) 135(H) 134(H)  BUN 6 - 23 mg/dL 22 20 32(H)  Creatinine 0.50 - 1.10 mg/dL 1.13(H) 1.3(H) 1.6(H)  Sodium 135 - 145 mmol/L 138 138 136  Potassium 3.5 - 5.1 mmol/L 4.8 4.7 3.9  Chloride 96 -  112 mmol/L 105 104 98  CO2 19 - 32 mmol/L _0 Calcium 8.4 - 10.5 mg/dL 9.8 10.2 9.6  Total Protein 6.0 - 8.3 g/dL - - -  Total Bilirubin 0.3 - 1.2 mg/dL - - -  Alkaline Phos 39 - 117 U/L - - -  AST 0 - 37 U/L - - -  ALT 0 - 35 U/L - - -       DIAGNOSTIC IMAGING:  I have independently reviewed the scans and discussed with the patient.    ASSESSMENT & PLAN:   Infiltrating lobular carcinoma of right breast in female ([place]) 1.  Right breast lobular carcinoma: - Mammogram on 06/09/2019 showed suspicious mass around 5 mm in the medial right breast with no sonographic correlate.  No lymphadenopathy in the right axilla. -Stereotactic biopsy on 06/18/2019 showed ILC in the right breast lower inner quadrant.  This was 90% positive for ER/PR.  Ki-67 was 5%.  Tumor was negative for HER-2. - Right breast upper inner quadrant biopsy showed atypical lobular hyperplasia with fibrocystic changes. - Dr. Arnoldo Morale recommended mastectomy.  Patient did not want to have any surgery done. -Physical examination did not reveal any palpable masses. - I have recommended doing MRI of the breast.  Patient could not do it as the machine was smaller in [place].  We will make arrangements for her to do it in [place]. -I will see her back after the MRI.  Patient is very interested in pursuing antiestrogen therapy rather than surgical resection.  I have clearly explained to the patient that cure can be achieved only by surgery.  She would like to think about it.  2.  Osteopenia: - DEXA scan in February 2018 showed T score of -2.1. - DEXA scan on 07/20/2019 shows T score of -1.9. -I have counseled her to start taking calcium and vitamin D supplements.  She apparently could not swallow big pills.  She will look for  gel capsules.  Total time spent is 25 minutes with more than 50% of time spent face-to-face discussing further work-up, counseling and coordination of care.    Orders placed this encounter:  Orders Placed This Encounter  Procedures  . MR BREAST BILATERAL W [place] CAD      Derek Jack, [place] [phone]

## 2019-07-22 NOTE — Assessment & Plan Note (Signed)
1.  Right breast lobular carcinoma: - Mammogram on 06/09/2019 showed suspicious mass around 5 mm in the medial right breast with no sonographic correlate.  No lymphadenopathy in the right axilla. -Stereotactic biopsy on 06/18/2019 showed ILC in the right breast lower inner quadrant.  This was 90% positive for ER/PR.  Ki-67 was 5%.  Tumor was negative for HER-2. - Right breast upper inner quadrant biopsy showed atypical lobular hyperplasia with fibrocystic changes. - Dr. Arnoldo Morale recommended mastectomy.  Patient did not want to have any surgery done. -Physical examination did not reveal any palpable masses. - I have recommended doing MRI of the breast.  Patient could not do it as the machine was smaller in [place].  We will make arrangements for her to do it in [place]. -I will see her back after the MRI.  Patient is very interested in pursuing antiestrogen therapy rather than surgical resection.  I have clearly explained to the patient that cure can be achieved only by surgery.  She would like to think about it.  2.  Osteopenia: - DEXA scan in February 2018 showed T score of -2.1. - DEXA scan on 07/20/2019 shows T score of -1.9. -I have counseled her to start taking calcium and vitamin D supplements.  She apparently could not swallow big pills.  She will look for gel capsules.

## 2019-07-22 NOTE — Patient Instructions (Addendum)
[place] at [facility] Discharge Instructions  You were seen today by Dr. Delton Coombes. He went over your recent test results. He will see you back after your MRI for follow up.   Thank you for choosing [place] at [facility] to provide your oncology and hematology care.  To afford each patient quality time with our provider, please arrive at least 15 minutes before your scheduled appointment time.   If you have a lab appointment with the [facility] please come in thru the  Main Entrance and check in at the main information desk  You need to re-schedule your appointment should you arrive 10 or more minutes late.  We strive to give you quality time with our providers, and arriving late affects you and other patients whose appointments are after yours.  Also, if you no show three or more times for appointments you may be dismissed from the clinic at the providers discretion.     Again, thank you for choosing [facility].  Our hope is that these requests will decrease the amount of time that you wait before being seen by our physicians.       _____________________________________________________________  Should you have questions after your visit to [facility], please contact our office at [phone] between the hours of 8:00 a.m. and 4:30 p.m.  Voicemails left after 4:00 p.m. will not be returned until the following business day.  For prescription refill requests, have your pharmacy contact our office and allow 72 hours.    Cancer Center Support Programs:   > Cancer Support Group  2nd Tuesday of the month 1pm-2pm, Journey Room

## 2019-07-23 DIAGNOSIS — H353231 Exudative age-related macular degeneration, bilateral, with active choroidal neovascularization: Secondary | ICD-10-CM | POA: Diagnosis not present

## 2019-08-05 DIAGNOSIS — Z23 Encounter for immunization: Secondary | ICD-10-CM | POA: Diagnosis not present

## 2019-08-06 DIAGNOSIS — H01001 Unspecified blepharitis right upper eyelid: Secondary | ICD-10-CM | POA: Diagnosis not present

## 2019-08-06 DIAGNOSIS — H01004 Unspecified blepharitis left upper eyelid: Secondary | ICD-10-CM | POA: Diagnosis not present

## 2019-08-06 DIAGNOSIS — H353211 Exudative age-related macular degeneration, right eye, with active choroidal neovascularization: Secondary | ICD-10-CM | POA: Diagnosis not present

## 2019-08-06 DIAGNOSIS — Z961 Presence of intraocular lens: Secondary | ICD-10-CM | POA: Diagnosis not present

## 2019-08-06 DIAGNOSIS — H353122 Nonexudative age-related macular degeneration, left eye, intermediate dry stage: Secondary | ICD-10-CM | POA: Diagnosis not present

## 2019-08-06 DIAGNOSIS — H01005 Unspecified blepharitis left lower eyelid: Secondary | ICD-10-CM | POA: Diagnosis not present

## 2019-08-06 DIAGNOSIS — H01002 Unspecified blepharitis right lower eyelid: Secondary | ICD-10-CM | POA: Diagnosis not present

## 2019-08-06 DIAGNOSIS — H40052 Ocular hypertension, left eye: Secondary | ICD-10-CM | POA: Diagnosis not present

## 2019-08-11 ENCOUNTER — Inpatient Hospital Stay: Admission: RE | Admit: 2019-08-11 | Payer: Medicare Other | Source: Ambulatory Visit

## 2019-08-12 ENCOUNTER — Other Ambulatory Visit: Payer: Medicare Other

## 2019-08-13 ENCOUNTER — Ambulatory Visit (HOSPITAL_COMMUNITY): Payer: Medicare Other | Admitting: Hematology

## 2019-08-21 ENCOUNTER — Other Ambulatory Visit: Payer: Self-pay

## 2019-08-21 ENCOUNTER — Ambulatory Visit
Admission: RE | Admit: 2019-08-21 | Discharge: 2019-08-21 | Disposition: A | Discharge status: Home / Self Care | Payer: Medicare Other | Source: Ambulatory Visit | Attending: Hematology | Admitting: Hematology

## 2019-08-21 DIAGNOSIS — C50911 Malignant neoplasm of unspecified site of right female breast: Secondary | ICD-10-CM

## 2019-08-21 DIAGNOSIS — N62 Hypertrophy of breast: Secondary | ICD-10-CM | POA: Diagnosis not present

## 2019-08-21 IMAGING — MR MR BREAST BILAT WO/W CM
8 of 12 series · 32 of 48 positions shown · IV contrast (10 ml Gadavist)
Comparison: Previous exam(s).

CLINICAL DATA: 78-year-old female with recently diagnosed right
breast cancer. Two sites were biopsied stereotactically, 1 of which
demonstrating grade 2 invasive lobular carcinoma and the other
demonstrating atypical lobular hyperplasia.

LABS:  Creatinine was obtained on site at [HOSPITAL] at [REDACTED] [HOSPITAL].
Results: Creatinine 1.5 mg/dL.
EXAM:
BILATERAL BREAST MRI WITH AND WITHOUT CONTRAST
TECHNIQUE: Multiplanar, multisequence MR images of both breasts were obtained
prior to and following the intravenous administration of 10 ml of
Gadavist.

[Series 3: t2_tirm_tra ipat (a-p) · axial · 3.0mm · 0.76mm/px · 1 of 62 slices shown]
[im 1/62]
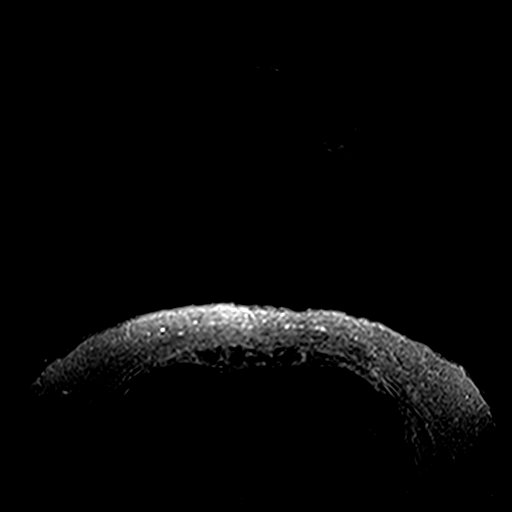

[Series 4: fl3d pre-cm no · axial · non-contrast · 1.2mm · 1.02mm/px · z∈[-38,+172]mm · 5 of 176 slices shown]
[im 1/176]
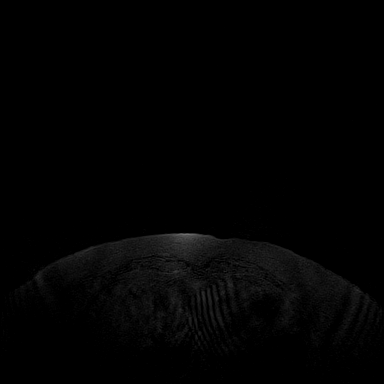
[im 44/176]
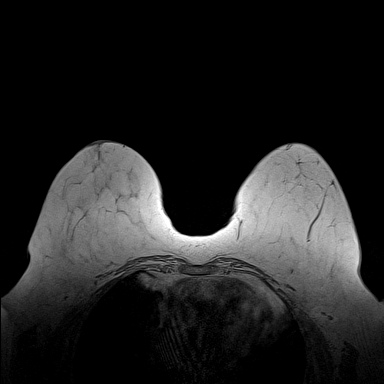
[im 88/176]
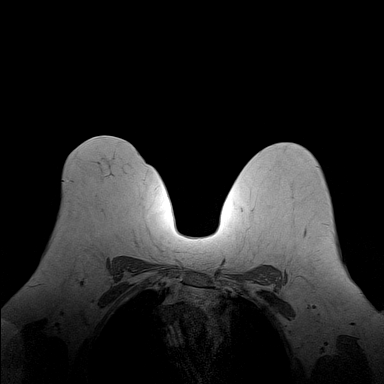
[im 132/176]
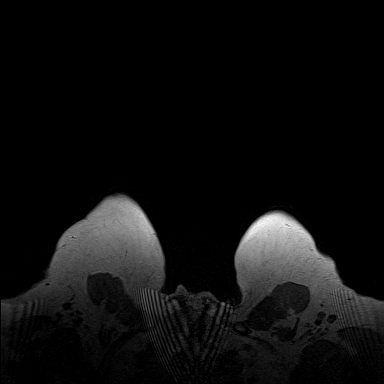
[im 176/176]
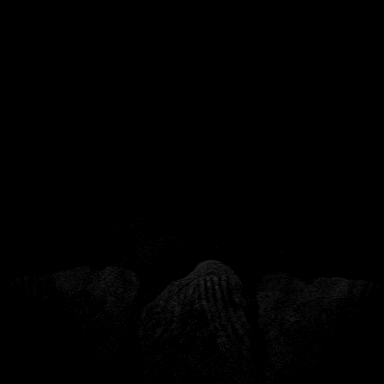

[Series 5: fl3d pre-cm · axial · non-contrast · 1.2mm · 1.02mm/px · z∈[-38,+172]mm · 5 of 176 slices shown]
[im 1/176]
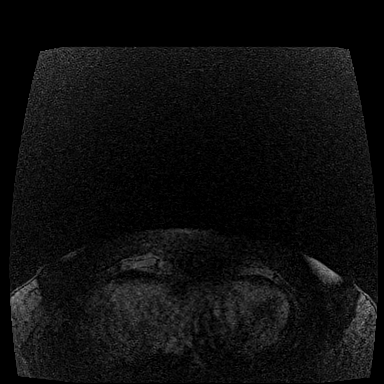
[im 44/176]
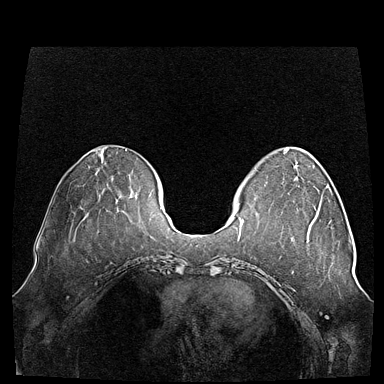
[im 88/176]
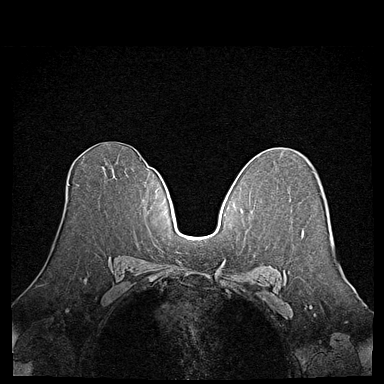
[im 132/176]
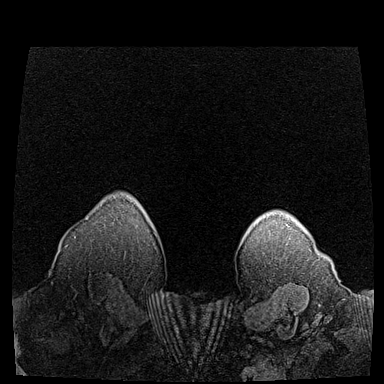
[im 176/176]
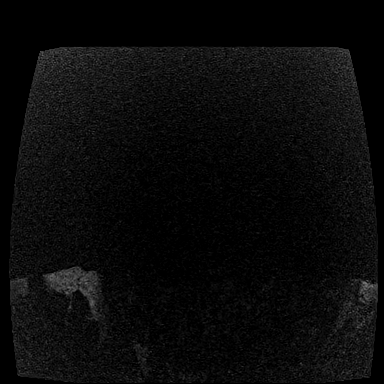

[Series 6: fl3d post-cm 20 · axial · 1.2mm · 1.02mm/px · z∈[-38,+172]mm · 5 of 176 slices shown (1 of 3)]
[im 1/176]
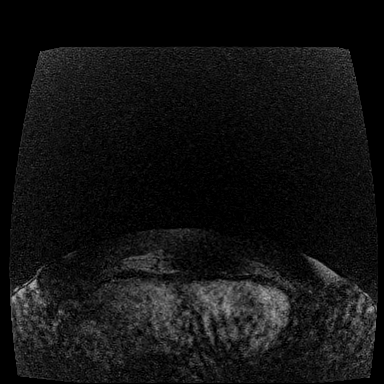
[im 44/176]
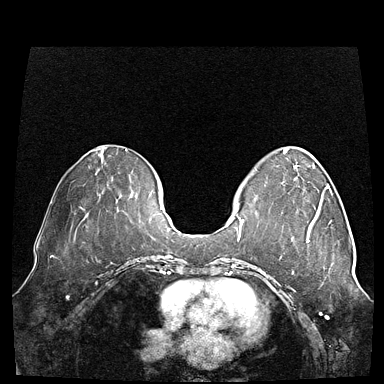
[im 88/176]
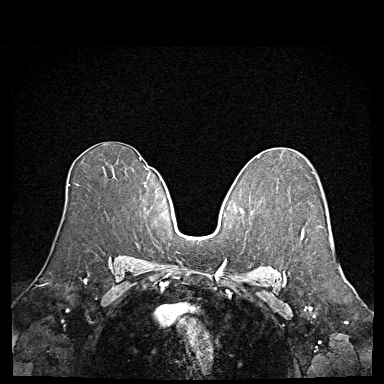
[im 132/176]
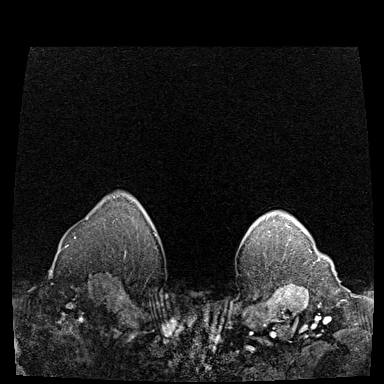
[im 176/176]
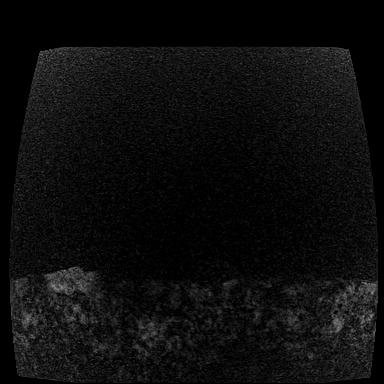

[Series 7: fl3d post-cm 20 · axial · 1.2mm · 1.02mm/px · z∈[-38,+172]mm · 5 of 176 slices shown (2 of 3)]
[im 1/176]
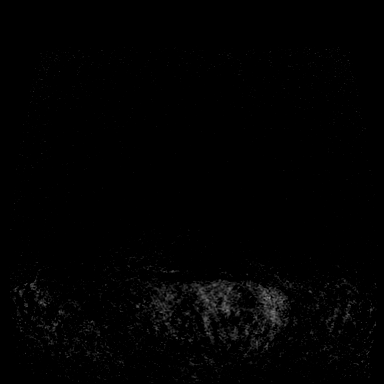
[im 44/176]
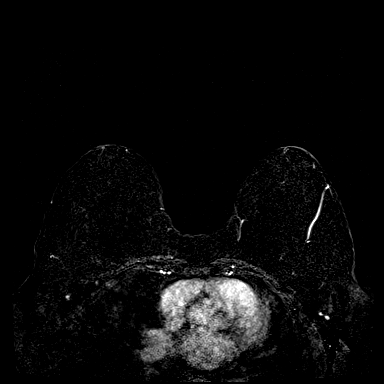
[im 88/176]
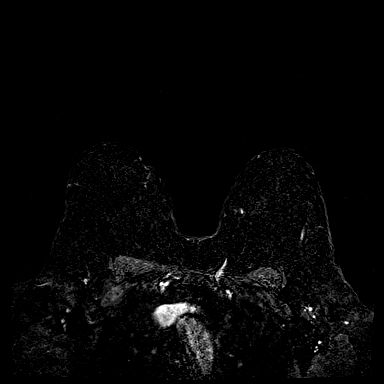
[im 132/176]
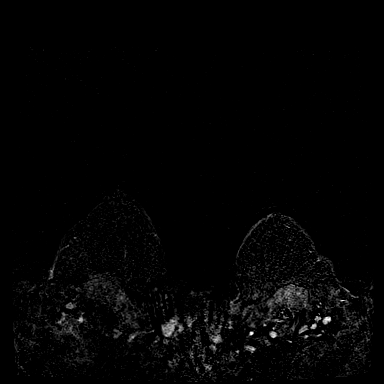
[im 176/176]
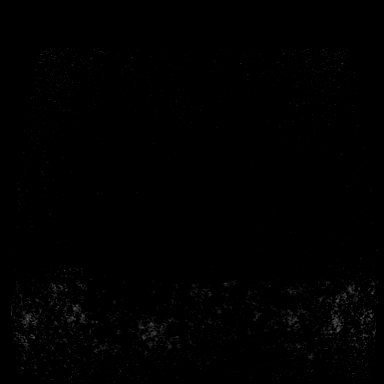

[Series 8: fl3d post-cm 20 · axial · 211.2mm · 1.02mm/px · 1 of 1 slices shown (3 of 3)]
[im 1/1]
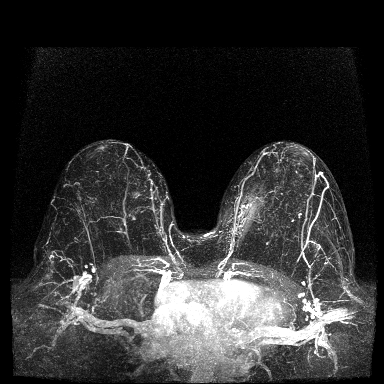

[Series 9: fl3d post-cm 3min · axial · 1.2mm · 1.02mm/px · z∈[-38,+172]mm · 6 of 176 slices shown]
[im 1/176]
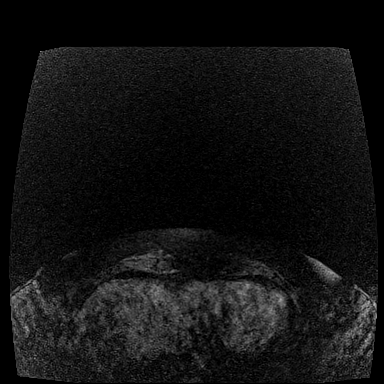
[im 36/176]
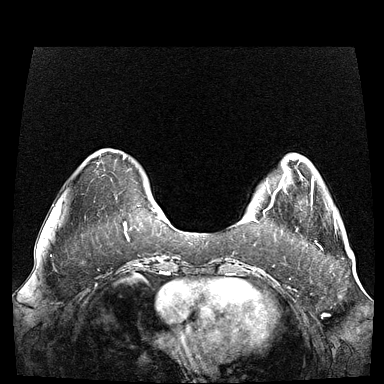
[im 71/176]
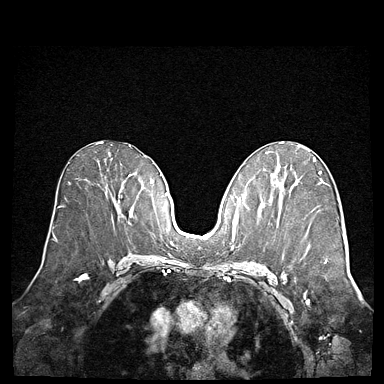
[im 106/176]
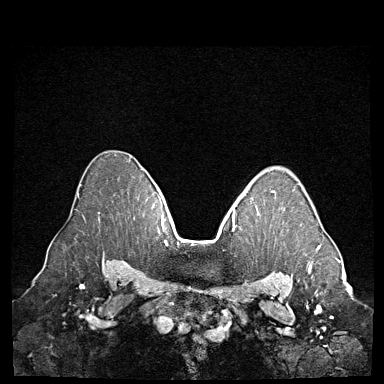
[im 141/176]
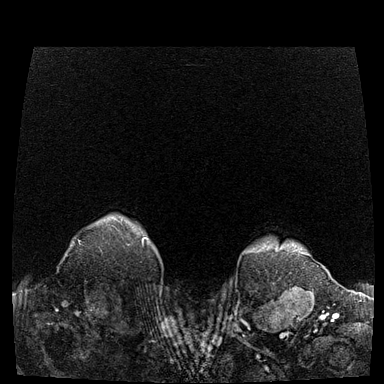
[im 176/176]
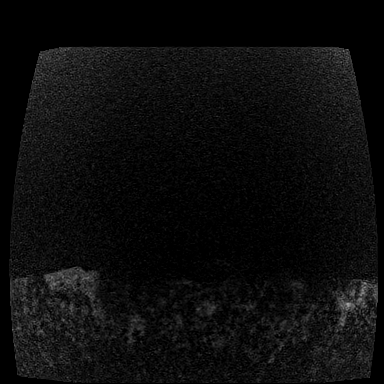

[Series 10: fl3d post-cm 3min_sub · axial · 1.2mm · 1.02mm/px · z∈[-38,+88]mm · 4 of 176 slices shown]
[im 1/176]
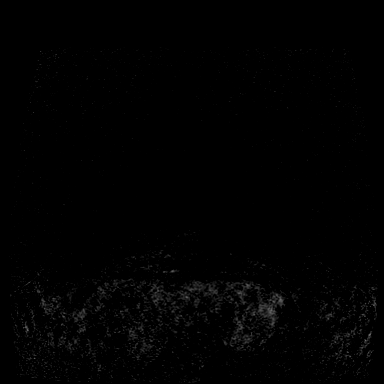
[im 36/176]
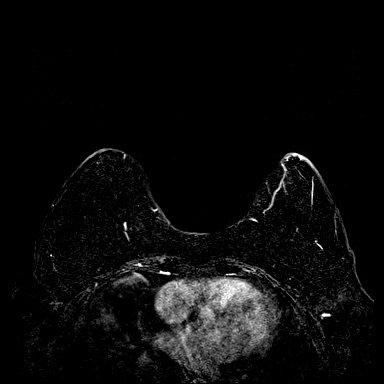
[im 71/176]
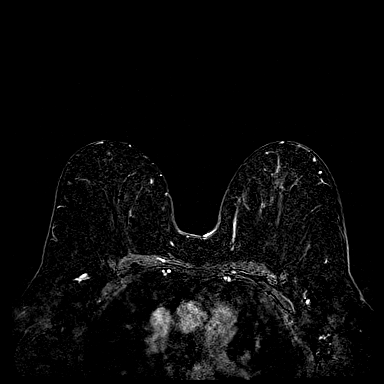
[im 106/176]
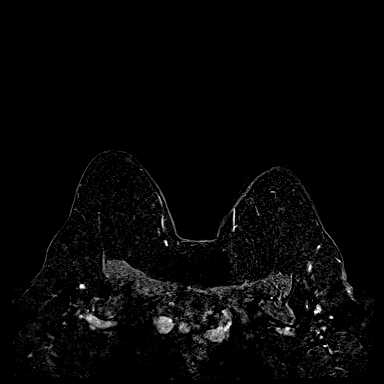

[32 of 48 positions shown; findings below may reference images not displayed]

Three-dimensional MR images were rendered by post-processing of the
original MR data on an independent workstation. The
three-dimensional MR images were interpreted, and findings are
reported in the following complete MRI report for this study. Three
dimensional images were evaluated at the independent [employer]
workstation
FINDINGS: Breast composition: b. Scattered fibroglandular tissue.

Background parenchymal enhancement: Mild.

Right breast: Susceptibility artifact from post biopsy clip is
demonstrated in the upper slightly inner aspect of the right breast
at middle depth (series 8, image 112/176). There are mild residual
post biopsy changes as well as associated non mass enhancement
measuring up to 1.3 x 0.9 cm just medial to the biopsy clip. This is
consistent with the patient's site of biopsy-proven atypical lobular
hyperplasia. A second post biopsy clip is demonstrated in the lower
inner right breast at posterior depth (series 8, image 116/176).
There is an associated 6 x 7 x 7 mm enhancing mass and mild post
biopsy changes. This is consistent with the patient's site of biopsy
proven invasive lobular carcinoma. Otherwise, now mass or abnormal
enhancement throughout the remainder of the right breast.

Left breast: No mass or abnormal enhancement.

Lymph nodes: No abnormal appearing lymph nodes.

Ancillary findings:  None.
IMPRESSION: 1. Biopsy proven right breast invasive lobular carcinoma and
atypical lobular hyperplasia.
2. No additional suspicious enhancement in the remainder of the
right breast.
3. No MRI evidence of malignancy in the left breast.

RECOMMENDATION:
Per clinical treatment plan.

BI-RADS CATEGORY  6: Known biopsy-proven malignancy.

## 2019-08-21 MED ORDER — GADOBUTROL 1 MMOL/ML IV SOLN
10.0000 mL | Freq: Once | INTRAVENOUS | Status: AC | PRN
  Administered 2019-08-21: 10 mL via INTRAVENOUS

## 2019-08-24 ENCOUNTER — Encounter (HOSPITAL_COMMUNITY): Payer: Self-pay | Admitting: Hematology

## 2019-08-24 ENCOUNTER — Other Ambulatory Visit: Payer: Self-pay

## 2019-08-24 ENCOUNTER — Inpatient Hospital Stay (HOSPITAL_COMMUNITY): Payer: Medicare Other | Attending: Hematology | Admitting: Hematology

## 2019-08-24 ENCOUNTER — Encounter (HOSPITAL_COMMUNITY): Payer: Self-pay

## 2019-08-24 VITALS — BP 182/44 | HR 60 | Temp 97.1°F | Resp 16 | Wt 228.3 lb

## 2019-08-24 DIAGNOSIS — N189 Chronic kidney disease, unspecified: Secondary | ICD-10-CM | POA: Insufficient documentation

## 2019-08-24 DIAGNOSIS — E785 Hyperlipidemia, unspecified: Secondary | ICD-10-CM | POA: Insufficient documentation

## 2019-08-24 DIAGNOSIS — M8580 Other specified disorders of bone density and structure, unspecified site: Secondary | ICD-10-CM | POA: Diagnosis not present

## 2019-08-24 DIAGNOSIS — C50211 Malignant neoplasm of upper-inner quadrant of right female breast: Secondary | ICD-10-CM | POA: Diagnosis not present

## 2019-08-24 DIAGNOSIS — M1990 Unspecified osteoarthritis, unspecified site: Secondary | ICD-10-CM | POA: Diagnosis not present

## 2019-08-24 DIAGNOSIS — C50911 Malignant neoplasm of unspecified site of right female breast: Secondary | ICD-10-CM

## 2019-08-24 DIAGNOSIS — I129 Hypertensive chronic kidney disease with stage 1 through stage 4 chronic kidney disease, or unspecified chronic kidney disease: Secondary | ICD-10-CM | POA: Insufficient documentation

## 2019-08-24 DIAGNOSIS — Z170 Estrogen receptor positive status [ER+]: Secondary | ICD-10-CM | POA: Diagnosis not present

## 2019-08-24 DIAGNOSIS — Z79899 Other long term (current) drug therapy: Secondary | ICD-10-CM | POA: Insufficient documentation

## 2019-08-24 MED ORDER — ANASTROZOLE 1 MG PO TABS: 1.0000 mg | ORAL_TABLET | Freq: Every day | ORAL | 6 refills | Status: DC

## 2019-08-24 NOTE — Progress Notes (Signed)
[place], [place] [address]   CLINIC:  Medical Oncology/Hematology  PCP:  Celene Squibb, MD [place] [id number] [phone]   REASON FOR VISIT:  Follow-up for right breast lobular carcinoma.  CURRENT THERAPY: Under work-up.   INTERVAL HISTORY:  Ms. Oatis 78 y.o. female seen for follow-up of right breast lobular carcinoma.  We have ordered MRI of the right breast at last visit.  Apparently she cannot do it in [place] as the machine is small.  Hence it was postponed.  She did complete DEXA scan which was done on 07/20/2019.  She denies any breast pains.  Numbness in the hands has been stable.  Appetite and energy levels are 100%.  Denies any new onset pains.    REVIEW OF SYSTEMS:  Review of Systems  Neurological: Positive for numbness.  All other systems reviewed and are negative.    PAST MEDICAL/SURGICAL HISTORY:  Past Medical History:  Diagnosis Date  . Chronic kidney disease   . Hyperlipidemia   . Hypertension   . Macular degeneration   . Osteoarthritis    Past Surgical History:  Procedure Laterality Date  . CATARACT EXTRACTION W/PHACO Right 02/07/2015   Procedure: CATARACT EXTRACTION PHACO AND INTRAOCULAR LENS PLACEMENT (IOC);  Surgeon: Tonny Branch, MD;  Location: [facility];  Service: Ophthalmology;  Laterality: Right;  CDE:12.72  . CATARACT EXTRACTION W/PHACO Left 02/24/2015   Procedure: CATARACT EXTRACTION PHACO AND INTRAOCULAR LENS PLACEMENT LEFT EYE;  Surgeon: Tonny Branch, MD;  Location: [facility];  Service: Ophthalmology;  Laterality: Left;  CDE 11.69  . COLONOSCOPY       SOCIAL HISTORY:  Social History   Socioeconomic History  . Marital status: Widowed    Spouse name: Not on file  . Number of children: 3  . Years of education: Not on file  . Highest education level: Not on file  Occupational History  . Not on file  Social Needs  . Financial resource strain: Not hard at all  . Food insecurity    Worry: Never true    Inability: Never true  . Transportation needs    Medical: No    Non-medical: No  Tobacco Use  . Smoking status: Never Smoker  . Smokeless tobacco: Never Used  Substance and Sexual Activity  . Alcohol use: No  . Drug use: No  . Sexual activity: Never  Lifestyle  . Physical activity    Days per week: 0 days    Minutes per session: 0 min  . Stress: Not at all  Relationships  . Social connections    Talks on phone: More than three times a week    Gets together: More than three times a week    Attends religious service: More than 4 times per year    Active member of club or organization: No    Attends meetings of clubs or organizations: Never    Relationship status: Widowed  . Intimate partner violence    Fear of current or ex partner: No    Emotionally abused: No    Physically abused: No    Forced sexual activity: No  Other Topics Concern  . Not on file  Social History Narrative  . Not on file    FAMILY HISTORY:  Family History  Problem Relation Age of Onset  . Macular degeneration Mother   . Diabetes Father   . Heart disease Daughter     CURRENT MEDICATIONS:  Outpatient  Encounter Medications as of 08/24/2019  Medication Sig  . Calcium-Vitamin D-Vitamin K (VIACTIV CALCIUM PLUS D) 650-12.5-40 MG-MCG-MCG CHEW Chew by mouth daily. Pt is taking 2 chewables daily  . amLODipine (NORVASC) 5 MG tablet Take 5 mg by mouth daily.  Marland Kitchen anastrozole (ARIMIDEX) 1 MG tablet Take 1 tablet (1 mg total) by mouth daily.  Marland Kitchen atenolol (TENORMIN) 25 MG tablet Take by mouth daily.  Marland Kitchen buPROPion (WELLBUTRIN XL) 150 MG 24 hr tablet Take 150 mg by mouth daily.  Marland Kitchen levocetirizine (XYZAL) 5 MG tablet Take 5 mg by mouth every evening.  . Melatonin 5 MG SUBL Place 1 tablet under the tongue every evening. PRN  . Multiple Vitamins-Minerals (PRESERVISION AREDS PO) Take 1 capsule by mouth 2 (two) times daily.  Marland Kitchen olmesartan (BENICAR) 40 MG tablet Take 40 mg by mouth daily.  .  simvastatin (ZOCOR) 20 MG tablet Take 1 tablet (20 mg total) by mouth at bedtime.  . triamcinolone ointment (KENALOG) 0.5 % Apply topically 2 (two) times daily. Please give pt a 90 day supply (Patient not taking: Reported on 07/15/2019)  . [DISCONTINUED] ALPRAZolam (XANAX) 0.5 MG tablet Take 0.5 mg by mouth at bedtime as needed for sleep.  . [DISCONTINUED] sodium zirconium cyclosilicate (LOKELMA) 5 g packet Take 5 g by mouth. One pack per day   No facility-administered encounter medications on file as of 08/24/2019.     ALLERGIES:  Allergies  Allergen Reactions  . Bupropion Hcl     REACTION: unspecified  . Sulfamethoxazole     REACTION: unspecified     PHYSICAL EXAM:  ECOG Performance status: 1  Vitals:   08/24/19 1007  BP: (!) 182/44  Pulse: 60  Resp: 16  Temp: (!) 97.1 F (36.2 C)  SpO2: 99%   Filed Weights   08/24/19 1007  Weight: 228 lb 4.8 oz (103.6 kg)    Physical Exam Vitals signs reviewed.  Constitutional:      Appearance: Normal appearance.  Cardiovascular:     Rate and Rhythm: Normal rate and regular rhythm.     Heart sounds: Normal heart sounds.  Pulmonary:     Effort: Pulmonary effort is normal.     Breath sounds: Normal breath sounds.  Abdominal:     General: There is no distension.     Palpations: Abdomen is soft. There is no mass.  Musculoskeletal:        General: No swelling.  Skin:    General: Skin is warm.  Neurological:     General: No focal deficit present.     Mental Status: She is alert and oriented to person, place, and time.  Psychiatric:        Mood and Affect: Mood normal.        Behavior: Behavior normal.    No breast mass palpable.  LABORATORY DATA:  I have reviewed the labs as listed.  CBC    Component Value Date/Time   WBC 8.1 02/03/2015 1130   RBC 4.13 02/03/2015 1130   HGB 12.2 02/03/2015 1130   HCT 37.6 02/03/2015 1130   PLT 405 (H) 02/03/2015 1130   MCV 91.0 02/03/2015 1130   MCH 29.5 02/03/2015 1130   MCHC  32.4 02/03/2015 1130   RDW 13.1 02/03/2015 1130   LYMPHSABS 1.2 11/13/2011 1443   MONOABS 0.7 11/13/2011 1443   EOSABS 0.4 11/13/2011 1443   BASOSABS 0.1 11/13/2011 1443   CMP Latest Ref Rng & Units 02/03/2015 12/26/2011 11/15/2011  Glucose 70 - 99 mg/dL 139(H) 135(H)  134(H)  BUN 6 - 23 mg/dL 22 20 32(H)  Creatinine 0.50 - 1.10 mg/dL 1.13(H) 1.3(H) 1.6(H)  Sodium 135 - 145 mmol/L 138 138 136  Potassium 3.5 - 5.1 mmol/L 4.8 4.7 3.9  Chloride 96 - 112 mmol/L 105 104 98  CO2 19 - 32 mmol/L _0 Calcium 8.4 - 10.5 mg/dL 9.8 10.2 9.6  Total Protein 6.0 - 8.3 g/dL - - -  Total Bilirubin 0.3 - 1.2 mg/dL - - -  Alkaline Phos 39 - 117 U/L - - -  AST 0 - 37 U/L - - -  ALT 0 - 35 U/L - - -       DIAGNOSTIC IMAGING:  I have independently reviewed the scans and discussed with the patient.    ASSESSMENT & PLAN:   Infiltrating lobular carcinoma of right breast in female ([place]) 1.  Right breast lobular carcinoma: - Mammogram on 06/09/2019 showed suspicious mass around 5 mm in the medial right breast with no sonographic correlate.  No lymphadenopathy in the right axilla. -Stereotactic biopsy on 06/18/2019 showed ILC in the right breast lower inner quadrant.  This was 90% positive for ER/PR.  Ki-67 was 5%.  Tumor was negative for HER-2. - Right breast upper inner quadrant biopsy showed atypical lobular hyperplasia with fibrocystic changes. - Dr. Arnoldo Morale recommended mastectomy.  Patient did not want to have any surgery done. -Physical examination did not reveal any palpable masses. - I have recommended doing MRI of the breast.  Patient could not do it as the machine was smaller in [place].  We will make arrangements for her to do it in [place]. - MRI of breast; 08/24/19: Revealed Biopsy proven right breast invasive lobular carcinoma and atypical lobular hyperplasia. No additional suspicious enhancement in the remainder of the right breast. No MRI evidence of malignancy in the left breast.  -   I have clearly explained to the patient that cure can be achieved only by surgery.  -  Pt does not want to pursue surgery at this time. She has opted for aromatase inhibitor. I have recommended Arimidex 1 mg daily. I discussed side effects at length. Pt verbalized understanding.  - She will return to clinic in 6 weeks.   2.  Osteopenia: - DEXA scan in February 2018 showed T score of -2.1. - DEXA scan on 07/20/2019 shows T score of -1.9. - She is currently taking Calcium and Vitamin D.   Total time spent is 25 minutes with more than 50% of time spent face-to-face discussing further work-up, counseling and coordination of care.    Orders placed this encounter:  Orders Placed This Encounter  Procedures  . CBC with Differential/Platelet  . Comprehensive metabolic panel      Derek Jack, MD [place] [phone]

## 2019-08-24 NOTE — Assessment & Plan Note (Signed)
1.  Right breast lobular carcinoma: - Mammogram on 06/09/2019 showed suspicious mass around 5 mm in the medial right breast with no sonographic correlate.  No lymphadenopathy in the right axilla. -Stereotactic biopsy on 06/18/2019 showed ILC in the right breast lower inner quadrant.  This was 90% positive for ER/PR.  Ki-67 was 5%.  Tumor was negative for HER-2. - Right breast upper inner quadrant biopsy showed atypical lobular hyperplasia with fibrocystic changes. - Dr. Arnoldo Morale recommended mastectomy.  Patient did not want to have any surgery done. -Physical examination did not reveal any palpable masses. - I have recommended doing MRI of the breast.  Patient could not do it as the machine was smaller in [place].  We will make arrangements for her to do it in [place]. - MRI of breast; 08/24/19: Revealed Biopsy proven right breast invasive lobular carcinoma and atypical lobular hyperplasia. No additional suspicious enhancement in the remainder of the right breast. No MRI evidence of malignancy in the left breast. -   I have clearly explained to the patient that cure can be achieved only by surgery.  -  Pt does not want to pursue surgery at this time. She has opted for aromatase inhibitor. I have recommended Arimidex 1 mg daily. I discussed side effects at length. Pt verbalized understanding.  - She will return to clinic in 6 weeks.   2.  Osteopenia: - DEXA scan in February 2018 showed T score of -2.1. - DEXA scan on 07/20/2019 shows T score of -1.9. - She is currently taking Calcium and Vitamin D.

## 2019-08-24 NOTE — Patient Instructions (Addendum)
[place] at [facility] Discharge Instructions  You were seen today by Dr. Delton Coombes. He went over your recent test results. He will send in a prescription for anastrazole (Arimidex), you should take this daily. He will see you back in 6 weeks for labs and follow up.   Thank you for choosing [place] at [facility] to provide your oncology and hematology care.  To afford each patient quality time with our provider, please arrive at least 15 minutes before your scheduled appointment time.   If you have a lab appointment with the [facility] please come in thru the  Main Entrance and check in at the main information desk  You need to re-schedule your appointment should you arrive 10 or more minutes late.  We strive to give you quality time with our providers, and arriving late affects you and other patients whose appointments are after yours.  Also, if you no show three or more times for appointments you may be dismissed from the clinic at the providers discretion.     Again, thank you for choosing [facility].  Our hope is that these requests will decrease the amount of time that you wait before being seen by our physicians.       _____________________________________________________________  Should you have questions after your visit to [facility], please contact our office at [phone] between the hours of 8:00 a.m. and 4:30 p.m.  Voicemails left after 4:00 p.m. will not be returned until the following business day.  For prescription refill requests, have your pharmacy contact our office and allow 72 hours.    Cancer Center Support Programs:   > Cancer Support Group  2nd Tuesday of the month 1pm-2pm, Journey Room

## 2019-08-26 ENCOUNTER — Other Ambulatory Visit: Payer: Medicare Other

## 2019-08-27 ENCOUNTER — Ambulatory Visit (HOSPITAL_COMMUNITY): Payer: Medicare Other | Admitting: Hematology

## 2019-09-02 DIAGNOSIS — I10 Essential (primary) hypertension: Secondary | ICD-10-CM | POA: Diagnosis not present

## 2019-09-02 DIAGNOSIS — E1122 Type 2 diabetes mellitus with diabetic chronic kidney disease: Secondary | ICD-10-CM | POA: Diagnosis not present

## 2019-09-02 DIAGNOSIS — E782 Mixed hyperlipidemia: Secondary | ICD-10-CM | POA: Diagnosis not present

## 2019-09-02 DIAGNOSIS — N183 Chronic kidney disease, stage 3 unspecified: Secondary | ICD-10-CM | POA: Diagnosis not present

## 2019-09-03 DIAGNOSIS — N1831 Chronic kidney disease, stage 3a: Secondary | ICD-10-CM | POA: Diagnosis not present

## 2019-09-03 DIAGNOSIS — C50911 Malignant neoplasm of unspecified site of right female breast: Secondary | ICD-10-CM | POA: Diagnosis not present

## 2019-09-03 DIAGNOSIS — E875 Hyperkalemia: Secondary | ICD-10-CM | POA: Diagnosis not present

## 2019-09-03 DIAGNOSIS — Z634 Disappearance and death of family member: Secondary | ICD-10-CM | POA: Diagnosis not present

## 2019-09-03 DIAGNOSIS — E782 Mixed hyperlipidemia: Secondary | ICD-10-CM | POA: Diagnosis not present

## 2019-09-03 DIAGNOSIS — I83012 Varicose veins of right lower extremity with ulcer of calf: Secondary | ICD-10-CM | POA: Diagnosis not present

## 2019-09-03 DIAGNOSIS — J302 Other seasonal allergic rhinitis: Secondary | ICD-10-CM | POA: Diagnosis not present

## 2019-09-03 DIAGNOSIS — R600 Localized edema: Secondary | ICD-10-CM | POA: Diagnosis not present

## 2019-09-03 DIAGNOSIS — E1122 Type 2 diabetes mellitus with diabetic chronic kidney disease: Secondary | ICD-10-CM | POA: Diagnosis not present

## 2019-09-03 DIAGNOSIS — H409 Unspecified glaucoma: Secondary | ICD-10-CM | POA: Diagnosis not present

## 2019-09-03 DIAGNOSIS — F419 Anxiety disorder, unspecified: Secondary | ICD-10-CM | POA: Diagnosis not present

## 2019-09-03 DIAGNOSIS — I10 Essential (primary) hypertension: Secondary | ICD-10-CM | POA: Diagnosis not present

## 2019-09-10 DIAGNOSIS — H353231 Exudative age-related macular degeneration, bilateral, with active choroidal neovascularization: Secondary | ICD-10-CM | POA: Diagnosis not present

## 2019-09-10 DIAGNOSIS — H43813 Vitreous degeneration, bilateral: Secondary | ICD-10-CM | POA: Diagnosis not present

## 2019-09-10 DIAGNOSIS — H35033 Hypertensive retinopathy, bilateral: Secondary | ICD-10-CM | POA: Diagnosis not present

## 2019-09-10 DIAGNOSIS — H3540 Unspecified peripheral retinal degeneration: Secondary | ICD-10-CM | POA: Diagnosis not present

## 2019-09-14 ENCOUNTER — Ambulatory Visit (HOSPITAL_COMMUNITY): Payer: Medicare Other | Admitting: Hematology

## 2019-09-30 ENCOUNTER — Other Ambulatory Visit: Payer: Self-pay

## 2019-09-30 ENCOUNTER — Inpatient Hospital Stay (HOSPITAL_COMMUNITY): Payer: Medicare Other | Attending: Hematology

## 2019-09-30 DIAGNOSIS — Z79899 Other long term (current) drug therapy: Secondary | ICD-10-CM | POA: Diagnosis not present

## 2019-09-30 DIAGNOSIS — E785 Hyperlipidemia, unspecified: Secondary | ICD-10-CM | POA: Insufficient documentation

## 2019-09-30 DIAGNOSIS — C50911 Malignant neoplasm of unspecified site of right female breast: Secondary | ICD-10-CM | POA: Insufficient documentation

## 2019-09-30 DIAGNOSIS — I129 Hypertensive chronic kidney disease with stage 1 through stage 4 chronic kidney disease, or unspecified chronic kidney disease: Secondary | ICD-10-CM | POA: Insufficient documentation

## 2019-09-30 DIAGNOSIS — Z170 Estrogen receptor positive status [ER+]: Secondary | ICD-10-CM | POA: Insufficient documentation

## 2019-09-30 DIAGNOSIS — Z79811 Long term (current) use of aromatase inhibitors: Secondary | ICD-10-CM | POA: Diagnosis not present

## 2019-09-30 DIAGNOSIS — M8580 Other specified disorders of bone density and structure, unspecified site: Secondary | ICD-10-CM | POA: Insufficient documentation

## 2019-09-30 DIAGNOSIS — M1990 Unspecified osteoarthritis, unspecified site: Secondary | ICD-10-CM | POA: Diagnosis not present

## 2019-09-30 DIAGNOSIS — N189 Chronic kidney disease, unspecified: Secondary | ICD-10-CM | POA: Diagnosis not present

## 2019-09-30 LAB — CBC WITH DIFFERENTIAL/PLATELET
Abs Immature Granulocytes: 0.02 10*3/uL (ref 0.00–0.07)
Basophils Absolute: 0.1 10*3/uL (ref 0.0–0.1)
Basophils Relative: 1 %
Eosinophils Absolute: 0.4 10*3/uL (ref 0.0–0.5)
Eosinophils Relative: 6 %
HCT: 35.8 % — ABNORMAL LOW (ref 36.0–46.0)
Hemoglobin: 11.1 g/dL — ABNORMAL LOW (ref 12.0–15.0)
Immature Granulocytes: 0 %
Lymphocytes Relative: 14 %
Lymphs Abs: 1 10*3/uL (ref 0.7–4.0)
MCH: 29.7 pg (ref 26.0–34.0)
MCHC: 31 g/dL (ref 30.0–36.0)
MCV: 95.7 fL (ref 80.0–100.0)
Monocytes Absolute: 0.6 10*3/uL (ref 0.1–1.0)
Monocytes Relative: 9 %
Neutro Abs: 4.7 10*3/uL (ref 1.7–7.7)
Neutrophils Relative %: 70 %
Platelets: 328 10*3/uL (ref 150–400)
RBC: 3.74 MIL/uL — ABNORMAL LOW (ref 3.87–5.11)
RDW: 12.7 % (ref 11.5–15.5)
WBC: 6.7 10*3/uL (ref 4.0–10.5)
nRBC: 0 % (ref 0.0–0.2)

## 2019-09-30 LAB — COMPREHENSIVE METABOLIC PANEL
ALT: 13 U/L (ref 0–44)
AST: 14 U/L — ABNORMAL LOW (ref 15–41)
Albumin: 4 g/dL (ref 3.5–5.0)
Alkaline Phosphatase: 59 U/L (ref 38–126)
Anion gap: 11 (ref 5–15)
BUN: 34 mg/dL — ABNORMAL HIGH (ref 8–23)
CO2: 24 mmol/L (ref 22–32)
Calcium: 9.8 mg/dL (ref 8.9–10.3)
Chloride: 103 mmol/L (ref 98–111)
Creatinine, Ser: 1.35 mg/dL — ABNORMAL HIGH (ref 0.44–1.00)
GFR calc Af Amer: 43 mL/min — ABNORMAL LOW (ref >60)
GFR calc non Af Amer: 38 mL/min — ABNORMAL LOW (ref >60)
Glucose, Bld: 162 mg/dL — ABNORMAL HIGH (ref 70–99)
Potassium: 5.1 mmol/L (ref 3.5–5.1)
Sodium: 138 mmol/L (ref 135–145)
Total Bilirubin: 0.9 mg/dL (ref 0.3–1.2)
Total Protein: 7.3 g/dL (ref 6.5–8.1)

## 2019-10-05 ENCOUNTER — Other Ambulatory Visit: Payer: Self-pay

## 2019-10-05 ENCOUNTER — Inpatient Hospital Stay (HOSPITAL_BASED_OUTPATIENT_CLINIC_OR_DEPARTMENT_OTHER): Payer: Medicare Other | Admitting: Hematology

## 2019-10-05 DIAGNOSIS — C50911 Malignant neoplasm of unspecified site of right female breast: Secondary | ICD-10-CM

## 2019-10-05 NOTE — Progress Notes (Signed)
Krista Payne, [address] [address]   CLINIC:  Medical Oncology/Hematology  PCP:  Celene Squibb, MD [place] [address] [phone]   REASON FOR VISIT:  Follow-up for Breast Cancer   CURRENT THERAPY: Anastrozole       INTERVAL HISTORY:  Krista Payne 78 y.o. female presents today for follow-up.  She reports overall doing well.  She denies any significant fatigue.  She is currently on anastrozole daily, tolerating well.  She denies any hot flashes.  No myalgias.  She recently had a breast MRI she is here to discuss results.  She denies any changes in her breast.  No new lumps, masses, nipple inversion or discharge.  She is still adamant that she does not want to pursue surgery.   REVIEW OF SYSTEMS:  Review of Systems  Constitutional: Negative.   HENT:  Negative.   Eyes: Negative.   Respiratory: Negative.   Cardiovascular: Negative.   Gastrointestinal: Negative.   Endocrine: Negative.   Genitourinary: Negative.    Musculoskeletal: Negative.   Skin: Negative.   Neurological: Negative.   Hematological: Negative.   Psychiatric/Behavioral: Negative.      PAST MEDICAL/SURGICAL HISTORY:  Past Medical History:  Diagnosis Date  . Chronic kidney disease   . Hyperlipidemia   . Hypertension   . Macular degeneration   . Osteoarthritis    Past Surgical History:  Procedure Laterality Date  . CATARACT EXTRACTION W/PHACO Right 02/07/2015   Procedure: CATARACT EXTRACTION PHACO AND INTRAOCULAR LENS PLACEMENT (IOC);  Surgeon: Tonny Branch, MD;  Location: [facility];  Service: Ophthalmology;  Laterality: Right;  CDE:12.72  . CATARACT EXTRACTION W/PHACO Left 02/24/2015   Procedure: CATARACT EXTRACTION PHACO AND INTRAOCULAR LENS PLACEMENT LEFT EYE;  Surgeon: Tonny Branch, MD;  Location: [facility];  Service: Ophthalmology;  Laterality: Left;  CDE 11.69  . COLONOSCOPY       SOCIAL HISTORY:  Social History   Socioeconomic History  . Marital  status: Widowed    Spouse name: Not on file  . Number of children: 3  . Years of education: Not on file  . Highest education level: Not on file  Occupational History  . Not on file  Social Needs  . Financial resource strain: Not hard at all  . Food insecurity    Worry: Never true    Inability: Never true  . Transportation needs    Medical: No    Non-medical: No  Tobacco Use  . Smoking status: Never Smoker  . Smokeless tobacco: Never Used  Substance and Sexual Activity  . Alcohol use: No  . Drug use: No  . Sexual activity: Never  Lifestyle  . Physical activity    Days per week: 0 days    Minutes per session: 0 min  . Stress: Not at all  Relationships  . Social connections    Talks on phone: More than three times a week    Gets together: More than three times a week    Attends religious service: More than 4 times per year    Active member of club or organization: No    Attends meetings of clubs or organizations: Never    Relationship status: Widowed  . Intimate partner violence    Fear of current or ex partner: No    Emotionally abused: No    Physically abused: No    Forced sexual activity: No  Other Topics Concern  . Not on file  Social  History Narrative  . Not on file    FAMILY HISTORY:  Family History  Problem Relation Age of Onset  . Macular degeneration Mother   . Diabetes Father   . Heart disease Daughter     CURRENT MEDICATIONS:  Outpatient Encounter Medications as of 10/05/2019  Medication Sig  . amLODipine (NORVASC) 5 MG tablet Take 5 mg by mouth daily.  Marland Kitchen anastrozole (ARIMIDEX) 1 MG tablet Take 1 tablet (1 mg total) by mouth daily.  Marland Kitchen atenolol (TENORMIN) 25 MG tablet Take by mouth daily.  Marland Kitchen buPROPion (WELLBUTRIN XL) 150 MG 24 hr tablet Take 150 mg by mouth daily.  . Calcium-Vitamin D-Vitamin K (VIACTIV CALCIUM PLUS D) 650-12.5-40 MG-MCG-MCG CHEW Chew by mouth daily. Pt is taking 2 chewables daily  . levocetirizine (XYZAL) 5 MG tablet Take 5 mg by  mouth every evening.  . Melatonin 5 MG SUBL Place 1 tablet under the tongue every evening. PRN  . Multiple Vitamins-Minerals (PRESERVISION AREDS PO) Take 1 capsule by mouth 2 (two) times daily.  Marland Kitchen olmesartan (BENICAR) 40 MG tablet Take 40 mg by mouth daily.  . simvastatin (ZOCOR) 20 MG tablet Take 1 tablet (20 mg total) by mouth at bedtime.  . triamcinolone ointment (KENALOG) 0.5 % Apply topically 2 (two) times daily. Please give pt a 90 day supply (Patient not taking: Reported on 07/15/2019)   No facility-administered encounter medications on file as of 10/05/2019.     ALLERGIES:  Allergies  Allergen Reactions  . Bupropion Hcl     REACTION: unspecified  . Sulfamethoxazole     REACTION: unspecified     PHYSICAL EXAM:  ECOG Performance status: 1  Vitals:   10/05/19 1358  BP: (!) 196/61  Pulse: 69  Resp: 18  Temp: (!) 97.5 F (36.4 C)  SpO2: 100%   Filed Weights   10/05/19 1358  Weight: 228 lb 9.6 oz (103.7 kg)    Physical Exam Constitutional:      Appearance: Normal appearance.  HENT:     Head: Normocephalic.     Right Ear: External ear normal.     Left Ear: External ear normal.  Eyes:     Conjunctiva/sclera: Conjunctivae normal.  Neck:     Musculoskeletal: Normal range of motion.  Cardiovascular:     Rate and Rhythm: Normal rate and regular rhythm.     Pulses: Normal pulses.     Heart sounds: Normal heart sounds.  Pulmonary:     Effort: Pulmonary effort is normal.     Breath sounds: Normal breath sounds.  Abdominal:     General: Bowel sounds are normal.  Musculoskeletal: Normal range of motion.  Skin:    General: Skin is warm.  Neurological:     General: No focal deficit present.     Mental Status: She is alert and oriented to person, place, and time.  Psychiatric:        Mood and Affect: Mood normal.        Behavior: Behavior normal.      LABORATORY DATA:  I have reviewed the labs as listed.  CBC    Component Value Date/Time   WBC 6.7  09/30/2019 1012   RBC 3.74 (L) 09/30/2019 1012   HGB 11.1 (L) 09/30/2019 1012   HCT 35.8 (L) 09/30/2019 1012   PLT 328 09/30/2019 1012   MCV 95.7 09/30/2019 1012   MCH 29.7 09/30/2019 1012   MCHC 31.0 09/30/2019 1012   RDW 12.7 09/30/2019 1012   LYMPHSABS 1.0 09/30/2019 1012  MONOABS 0.6 09/30/2019 1012   EOSABS 0.4 09/30/2019 1012   BASOSABS 0.1 09/30/2019 1012   CMP Latest Ref Rng & Units 09/30/2019 02/03/2015 12/26/2011  Glucose 70 - 99 mg/dL 162(H) 139(H) 135(H)  BUN 8 - 23 mg/dL 34(H) 22 20  Creatinine 0.44 - 1.00 mg/dL 1.35(H) 1.13(H) 1.3(H)  Sodium 135 - 145 mmol/L 138 138 138  Potassium 3.5 - 5.1 mmol/L 5.1 4.8 4.7  Chloride 98 - 111 mmol/L 103 105 104  CO2 22 - 32 mmol/L '24 26 24  '$ Calcium 8.9 - 10.3 mg/dL 9.8 9.8 10.2  Total Protein 6.5 - 8.1 g/dL 7.3 - -  Total Bilirubin 0.3 - 1.2 mg/dL 0.9 - -  Alkaline Phos 38 - 126 U/L 59 - -  AST 15 - 41 U/L 14(L) - -  ALT 0 - 44 U/L 13 - -          ASSESSMENT & PLAN:   Infiltrating lobular carcinoma of right breast in female ([place]) 1.  Right breast lobular carcinoma: - Mammogram on 06/09/2019 showed suspicious mass around 5 mm in the medial right breast with no sonographic correlate.  No lymphadenopathy in the right axilla. -Stereotactic biopsy on 06/18/2019 showed ILC in the right breast lower inner quadrant.  This was 90% positive for ER/PR.  Ki-67 was 5%.  Tumor was negative for HER-2. - Right breast upper inner quadrant biopsy showed atypical lobular hyperplasia with fibrocystic changes. - Dr. Arnoldo Morale recommended mastectomy.  Patient did not want to have any surgery done. -Physical examination did not reveal any palpable masses. - I have recommended doing MRI of the breast.  Patient could not do it as the machine was smaller in [place].  We will make arrangements for her to do it in [place]. - MRI of breast; 08/24/19: Revealed Biopsy proven right breast invasive lobular carcinoma and atypical lobular hyperplasia. No  additional suspicious enhancement in the remainder of the right breast. No MRI evidence of malignancy in the left breast. -   I have clearly explained to the patient that cure can be achieved only by surgery.  -  She has opted for aromatase inhibitor. I have recommended Arimidex 1 mg daily. I discussed side effects at length. Pt verbalized understanding.  -Discussed surgery options with patient again.  Patient is not interested in pursuing surgery at this time.  She will continue Arimidex 1 mg daily. -She will return to clinic in 12 weeks or sooner if needed.  2.  Osteopenia: - DEXA scan in February 2018 showed T score of -2.1. - DEXA scan on 07/20/2019 shows T score of -1.9. - She is currently taking Calcium and Vitamin D.   3.  Hypertension Blood pressure was elevated at time of arrival at 196/61.  Patient states she does have whitecoat syndrome.  She states she is taking her blood pressure meds as prescribed.  Repeat blood pressure was 170/60.  Have asked the patient to check blood pressure when she is home.  If blood pressure remains greater than 160/90 to follow-up with her primary care.         [place] [phone]

## 2019-10-05 NOTE — Assessment & Plan Note (Addendum)
1.  Right breast lobular carcinoma: - Mammogram on 06/09/2019 showed suspicious mass around 5 mm in the medial right breast with no sonographic correlate.  No lymphadenopathy in the right axilla. -Stereotactic biopsy on 06/18/2019 showed ILC in the right breast lower inner quadrant.  This was 90% positive for ER/PR.  Ki-67 was 5%.  Tumor was negative for HER-2. - Right breast upper inner quadrant biopsy showed atypical lobular hyperplasia with fibrocystic changes. - Dr. Arnoldo Morale recommended mastectomy.  Patient did not want to have any surgery done. -Physical examination did not reveal any palpable masses. - I have recommended doing MRI of the breast.  Patient could not do it as the machine was smaller in [place].  We will make arrangements for her to do it in [place]. - MRI of breast; 08/24/19: Revealed Biopsy proven right breast invasive lobular carcinoma and atypical lobular hyperplasia. No additional suspicious enhancement in the remainder of the right breast. No MRI evidence of malignancy in the left breast. -   I have clearly explained to the patient that cure can be achieved only by surgery.  -  She has opted for aromatase inhibitor. I have recommended Arimidex 1 mg daily. I discussed side effects at length. Pt verbalized understanding.  -Discussed surgery options with patient again.  Patient is not interested in pursuing surgery at this time.  She will continue Arimidex 1 mg daily. -She will return to clinic in 12 weeks or sooner if needed.  2.  Osteopenia: - DEXA scan in February 2018 showed T score of -2.1. - DEXA scan on 07/20/2019 shows T score of -1.9. - She is currently taking Calcium and Vitamin D.   3.  Hypertension Blood pressure was elevated at time of arrival at 196/61.  Patient states she does have whitecoat syndrome.  She states she is taking her blood pressure meds as prescribed.  Repeat blood pressure was 170/60.  Have asked the patient to check blood pressure when she is  home.  If blood pressure remains greater than 160/90 to follow-up with her primary care.

## 2019-10-06 ENCOUNTER — Ambulatory Visit (HOSPITAL_COMMUNITY): Payer: Medicare Other | Admitting: Hematology

## 2019-11-12 DIAGNOSIS — H35033 Hypertensive retinopathy, bilateral: Secondary | ICD-10-CM | POA: Diagnosis not present

## 2019-11-12 DIAGNOSIS — H3540 Unspecified peripheral retinal degeneration: Secondary | ICD-10-CM | POA: Diagnosis not present

## 2019-11-12 DIAGNOSIS — H43813 Vitreous degeneration, bilateral: Secondary | ICD-10-CM | POA: Diagnosis not present

## 2019-11-12 DIAGNOSIS — H353231 Exudative age-related macular degeneration, bilateral, with active choroidal neovascularization: Secondary | ICD-10-CM | POA: Diagnosis not present

## 2019-12-21 DIAGNOSIS — F419 Anxiety disorder, unspecified: Secondary | ICD-10-CM | POA: Diagnosis not present

## 2019-12-21 DIAGNOSIS — S81801A Unspecified open wound, right lower leg, initial encounter: Secondary | ICD-10-CM | POA: Diagnosis not present

## 2019-12-21 DIAGNOSIS — H409 Unspecified glaucoma: Secondary | ICD-10-CM | POA: Diagnosis not present

## 2019-12-21 DIAGNOSIS — W57XXXA Bitten or stung by nonvenomous insect and other nonvenomous arthropods, initial encounter: Secondary | ICD-10-CM | POA: Diagnosis not present

## 2019-12-21 DIAGNOSIS — N1831 Chronic kidney disease, stage 3a: Secondary | ICD-10-CM | POA: Diagnosis not present

## 2019-12-21 DIAGNOSIS — H3530 Unspecified macular degeneration: Secondary | ICD-10-CM | POA: Diagnosis not present

## 2019-12-21 DIAGNOSIS — D0511 Intraductal carcinoma in situ of right breast: Secondary | ICD-10-CM | POA: Diagnosis not present

## 2019-12-21 DIAGNOSIS — I1310 Hypertensive heart and chronic kidney disease without heart failure, with stage 1 through stage 4 chronic kidney disease, or unspecified chronic kidney disease: Secondary | ICD-10-CM | POA: Diagnosis not present

## 2019-12-21 DIAGNOSIS — Z1889 Other specified retained foreign body fragments: Secondary | ICD-10-CM | POA: Diagnosis not present

## 2019-12-21 DIAGNOSIS — R928 Other abnormal and inconclusive findings on diagnostic imaging of breast: Secondary | ICD-10-CM | POA: Diagnosis not present

## 2019-12-21 DIAGNOSIS — L97919 Non-pressure chronic ulcer of unspecified part of right lower leg with unspecified severity: Secondary | ICD-10-CM | POA: Diagnosis not present

## 2019-12-21 DIAGNOSIS — M8580 Other specified disorders of bone density and structure, unspecified site: Secondary | ICD-10-CM | POA: Diagnosis not present

## 2019-12-23 DIAGNOSIS — E782 Mixed hyperlipidemia: Secondary | ICD-10-CM | POA: Diagnosis not present

## 2019-12-23 DIAGNOSIS — I10 Essential (primary) hypertension: Secondary | ICD-10-CM | POA: Diagnosis not present

## 2019-12-23 DIAGNOSIS — R600 Localized edema: Secondary | ICD-10-CM | POA: Diagnosis not present

## 2019-12-23 DIAGNOSIS — F419 Anxiety disorder, unspecified: Secondary | ICD-10-CM | POA: Diagnosis not present

## 2019-12-23 DIAGNOSIS — H409 Unspecified glaucoma: Secondary | ICD-10-CM | POA: Diagnosis not present

## 2019-12-23 DIAGNOSIS — I83012 Varicose veins of right lower extremity with ulcer of calf: Secondary | ICD-10-CM | POA: Diagnosis not present

## 2019-12-23 DIAGNOSIS — E875 Hyperkalemia: Secondary | ICD-10-CM | POA: Diagnosis not present

## 2019-12-23 DIAGNOSIS — J302 Other seasonal allergic rhinitis: Secondary | ICD-10-CM | POA: Diagnosis not present

## 2019-12-23 DIAGNOSIS — N1831 Chronic kidney disease, stage 3a: Secondary | ICD-10-CM | POA: Diagnosis not present

## 2019-12-23 DIAGNOSIS — E1122 Type 2 diabetes mellitus with diabetic chronic kidney disease: Secondary | ICD-10-CM | POA: Diagnosis not present

## 2019-12-23 DIAGNOSIS — Z634 Disappearance and death of family member: Secondary | ICD-10-CM | POA: Diagnosis not present

## 2019-12-23 DIAGNOSIS — C50911 Malignant neoplasm of unspecified site of right female breast: Secondary | ICD-10-CM | POA: Diagnosis not present

## 2020-01-18 ENCOUNTER — Inpatient Hospital Stay (HOSPITAL_COMMUNITY): Payer: Medicare Other

## 2020-01-21 DIAGNOSIS — H35033 Hypertensive retinopathy, bilateral: Secondary | ICD-10-CM | POA: Diagnosis not present

## 2020-01-21 DIAGNOSIS — H353231 Exudative age-related macular degeneration, bilateral, with active choroidal neovascularization: Secondary | ICD-10-CM | POA: Diagnosis not present

## 2020-01-21 DIAGNOSIS — H43813 Vitreous degeneration, bilateral: Secondary | ICD-10-CM | POA: Diagnosis not present

## 2020-01-21 DIAGNOSIS — H3540 Unspecified peripheral retinal degeneration: Secondary | ICD-10-CM | POA: Diagnosis not present

## 2020-01-25 ENCOUNTER — Inpatient Hospital Stay (HOSPITAL_COMMUNITY): Payer: Medicare Other | Attending: Hematology | Admitting: Hematology

## 2020-01-25 ENCOUNTER — Other Ambulatory Visit: Payer: Self-pay

## 2020-01-25 ENCOUNTER — Encounter (HOSPITAL_COMMUNITY): Payer: Self-pay | Admitting: Hematology

## 2020-01-25 DIAGNOSIS — Z79811 Long term (current) use of aromatase inhibitors: Secondary | ICD-10-CM | POA: Diagnosis not present

## 2020-01-25 DIAGNOSIS — C50311 Malignant neoplasm of lower-inner quadrant of right female breast: Secondary | ICD-10-CM | POA: Diagnosis not present

## 2020-01-25 DIAGNOSIS — M8580 Other specified disorders of bone density and structure, unspecified site: Secondary | ICD-10-CM | POA: Diagnosis not present

## 2020-01-25 DIAGNOSIS — Z170 Estrogen receptor positive status [ER+]: Secondary | ICD-10-CM | POA: Diagnosis not present

## 2020-01-25 DIAGNOSIS — N189 Chronic kidney disease, unspecified: Secondary | ICD-10-CM | POA: Insufficient documentation

## 2020-01-25 DIAGNOSIS — C50911 Malignant neoplasm of unspecified site of right female breast: Secondary | ICD-10-CM | POA: Diagnosis not present

## 2020-01-25 DIAGNOSIS — Z79899 Other long term (current) drug therapy: Secondary | ICD-10-CM | POA: Insufficient documentation

## 2020-01-25 DIAGNOSIS — I10 Essential (primary) hypertension: Secondary | ICD-10-CM | POA: Diagnosis not present

## 2020-01-25 MED ORDER — ANASTROZOLE 1 MG PO TABS: 1.0000 mg | ORAL_TABLET | Freq: Every day | ORAL | 3 refills | Status: DC

## 2020-01-25 NOTE — Patient Instructions (Addendum)
[place] at [facility] Discharge Instructions  You were seen today by Dr. Delton Coombes. He went over your recent lab results. We will schedule you for a MRI of your right breast.  He will see you back after your MRI for labs and follow up.   Thank you for choosing [place] at [facility] to provide your oncology and hematology care.  To afford each patient quality time with our provider, please arrive at least 15 minutes before your scheduled appointment time.   If you have a lab appointment with the [facility] please come in thru the  Main Entrance and check in at the main information desk  You need to re-schedule your appointment should you arrive 10 or more minutes late.  We strive to give you quality time with our providers, and arriving late affects you and other patients whose appointments are after yours.  Also, if you no show three or more times for appointments you may be dismissed from the clinic at the providers discretion.     Again, thank you for choosing [facility].  Our hope is that these requests will decrease the amount of time that you wait before being seen by our physicians.       _____________________________________________________________  Should you have questions after your visit to [facility], please contact our office at [phone] between the hours of 8:00 a.m. and 4:30 p.m.  Voicemails left after 4:00 p.m. will not be returned until the following business day.  For prescription refill requests, have your pharmacy contact our office and allow 72 hours.    Cancer Center Support Programs:   > Cancer Support Group  2nd Tuesday of the month 1pm-2pm, [facility]

## 2020-01-25 NOTE — Assessment & Plan Note (Signed)
1.  Right breast invasive lobular carcinoma: -Stereotactic biopsy on 06/18/2019 showed ILC in the right breast lower inner quadrant.  90% positive for ER/PR.  Ki-67 was 5%.  Tumor negative for HER-2. -She refused surgical intervention. -MRI of the breast on 08/24/2019 showed biopsy-proven right breast invasive lobular carcinoma and atypical lobular hyperplasia.  No additional suspicious enhancements. -She was started on anastrozole on 08/24/2019. -She is tolerating it very well.  Denies any major hot flashes or musculoskeletal symptoms. -Physical exam today did not reveal any palpable masses.  No palpable adenopathy. -I have reviewed labs from Dr. Hall's office dated 12/21/2019.  White count is 7.3, hemoglobin 12, platelet count 316.  Absolute eosinophil count was slightly high at 600.  LFTs, calcium was normal. -I have recommended MRI of the breast with and without contrast to evaluate response from anastrozole. -We will see her back after the MRI.  2.  Osteopenia: -DEXA scan in February 2018 showed T score -2.1. -DEXA scan on 07/20/2019 showed T score -1.9. -She will continue calcium and vitamin D supplements.  3.  CKD: -Creatinine is stable around 1.4. 

## 2020-01-25 NOTE — Progress Notes (Signed)
[place] Krista Payne, [place] [address]   CLINIC:  Medical Oncology/Hematology  PCP:  Celene Squibb, MD [place] [address] [phone]   REASON FOR VISIT:  Follow-up for right breast cancer.  CURRENT THERAPY: Anastrozole.   INTERVAL HISTORY:  Krista Payne 79 y.o. female seen for follow-up of right breast invasive lobular carcinoma.  She is taking anastrozole without fail.  She denies any hot flashes or musculoskeletal symptoms.  She takes calcium and vitamin D daily.  Denies any new onset pains.  Denies any fevers, night sweats or weight loss.    REVIEW OF SYSTEMS:  Review of Systems  All other systems reviewed and are negative.    PAST MEDICAL/SURGICAL HISTORY:  Past Medical History:  Diagnosis Date  . Chronic kidney disease   . Hyperlipidemia   . Hypertension   . Macular degeneration   . Osteoarthritis    Past Surgical History:  Procedure Laterality Date  . CATARACT EXTRACTION W/PHACO Right 02/07/2015   Procedure: CATARACT EXTRACTION PHACO AND INTRAOCULAR LENS PLACEMENT (IOC);  Surgeon: Tonny Branch, MD;  Location: [facility];  Service: Ophthalmology;  Laterality: Right;  CDE:12.72  . CATARACT EXTRACTION W/PHACO Left 02/24/2015   Procedure: CATARACT EXTRACTION PHACO AND INTRAOCULAR LENS PLACEMENT LEFT EYE;  Surgeon: Tonny Branch, MD;  Location: [facility];  Service: Ophthalmology;  Laterality: Left;  CDE 11.69  . COLONOSCOPY       SOCIAL HISTORY:  Social History   Socioeconomic History  . Marital status: Widowed    Spouse name: Not on file  . Number of children: 3  . Years of education: Not on file  . Highest education level: Not on file  Occupational History  . Not on file  Tobacco Use  . Smoking status: Never Smoker  . Smokeless tobacco: Never Used  Substance and Sexual Activity  . Alcohol use: No  . Drug use: No  . Sexual activity: Never  Other Topics Concern  . Not on file  Social History Narrative  . Not on  file   Social Determinants of Health   Financial Resource Strain: Low Risk   . Difficulty of Paying Living Expenses: Not hard at all  Food Insecurity: No Food Insecurity  . Worried About Charity fundraiser in the Last Year: Never true  . Ran Out of Food in the Last Year: Never true  Transportation Needs: No Transportation Needs  . Lack of Transportation (Medical): No  . Lack of Transportation (Non-Medical): No  Physical Activity: Inactive  . Days of Exercise per Week: 0 days  . Minutes of Exercise per Session: 0 min  Stress: No Stress Concern Present  . Feeling of Stress : Not at all  Social Connections: Somewhat Isolated  . Frequency of Communication with Friends and Family: More than three times a week  . Frequency of Social Gatherings with Friends and Family: More than three times a week  . Attends Religious Services: More than 4 times per year  . Active Member of Clubs or Organizations: No  . Attends Archivist Meetings: Never  . Marital Status: Widowed  Intimate Partner Violence: Not At Risk  . Fear of Current or Ex-Partner: No  . Emotionally Abused: No  . Physically Abused: No  . Sexually Abused: No    FAMILY HISTORY:  Family History  Problem Relation Age of Onset  . Macular degeneration Mother   . Diabetes Father   . Heart disease Daughter  CURRENT MEDICATIONS:  Outpatient Encounter Medications as of 01/25/2020  Medication Sig  . amLODipine (NORVASC) 5 MG tablet Take 5 mg by mouth daily.  Marland Kitchen anastrozole (ARIMIDEX) 1 MG tablet Take 1 tablet (1 mg total) by mouth daily.  Marland Kitchen atenolol (TENORMIN) 25 MG tablet Take by mouth daily.  Marland Kitchen buPROPion (WELLBUTRIN XL) 150 MG 24 hr tablet Take 150 mg by mouth daily.  . Calcium-Vitamin D-Vitamin K (VIACTIV CALCIUM PLUS D) 650-12.5-40 MG-MCG-MCG CHEW Chew by mouth daily. Pt is taking 2 chewables daily  . levocetirizine (XYZAL) 5 MG tablet Take 5 mg by mouth every evening.  . Melatonin 5 MG SUBL Place 1 tablet under the  tongue every evening. PRN  . Multiple Vitamins-Minerals (PRESERVISION AREDS PO) Take 1 capsule by mouth 2 (two) times daily.  Marland Kitchen olmesartan (BENICAR) 40 MG tablet Take 40 mg by mouth daily.  . simvastatin (ZOCOR) 20 MG tablet Take 1 tablet (20 mg total) by mouth at bedtime.  . triamcinolone ointment (KENALOG) 0.5 % Apply topically 2 (two) times daily. Please give pt a 90 day supply  . [DISCONTINUED] anastrozole (ARIMIDEX) 1 MG tablet Take 1 tablet (1 mg total) by mouth daily.   No facility-administered encounter medications on file as of 01/25/2020.    ALLERGIES:  Allergies  Allergen Reactions  . Bupropion Hcl     REACTION: unspecified  . Sulfamethoxazole     REACTION: unspecified     PHYSICAL EXAM:  ECOG Performance status: 1  Vitals:   01/25/20 1125  BP: (!) 189/89  Pulse: 77  Resp: 16  Temp: (!) 96.8 F (36 C)  SpO2: 99%   Filed Weights   01/25/20 1125  Weight: 229 lb 1 oz (103.9 kg)    Physical Exam Vitals reviewed.  Constitutional:      Appearance: Normal appearance.  Cardiovascular:     Rate and Rhythm: Normal rate and regular rhythm.     Heart sounds: Normal heart sounds.  Pulmonary:     Effort: Pulmonary effort is normal.     Breath sounds: Normal breath sounds.  Abdominal:     General: There is no distension.     Palpations: Abdomen is soft. There is no mass.  Skin:    General: Skin is warm.  Neurological:     General: No focal deficit present.     Mental Status: She is alert and oriented to person, place, and time.  Psychiatric:        Mood and Affect: Mood normal.        Behavior: Behavior normal.    No breast masses palpable.  No palpable adenopathy.  LABORATORY DATA:  I have reviewed the labs as listed.  CBC    Component Value Date/Time   WBC 6.7 09/30/2019 1012   RBC 3.74 (L) 09/30/2019 1012   HGB 11.1 (L) 09/30/2019 1012   HCT 35.8 (L) 09/30/2019 1012   PLT 328 09/30/2019 1012   MCV 95.7 09/30/2019 1012   MCH 29.7 09/30/2019 1012     MCHC 31.0 09/30/2019 1012   RDW 12.7 09/30/2019 1012   LYMPHSABS 1.0 09/30/2019 1012   MONOABS 0.6 09/30/2019 1012   EOSABS 0.4 09/30/2019 1012   BASOSABS 0.1 09/30/2019 1012   CMP Latest Ref Rng & Units 09/30/2019 02/03/2015 12/26/2011  Glucose 70 - 99 mg/dL 162(H) 139(H) 135(H)  BUN 8 - 23 mg/dL 34(H) 22 20  Creatinine 0.44 - 1.00 mg/dL 1.35(H) 1.13(H) 1.3(H)  Sodium 135 - 145 mmol/L 138 138 138  Potassium  3.5 - 5.1 mmol/L 5.1 4.8 4.7  Chloride 98 - 111 mmol/L 103 105 104  CO2 22 - 32 mmol/L '24 26 24  '$ Calcium 8.9 - 10.3 mg/dL 9.8 9.8 10.2  Total Protein 6.5 - 8.1 g/dL 7.3 - -  Total Bilirubin 0.3 - 1.2 mg/dL 0.9 - -  Alkaline Phos 38 - 126 U/L 59 - -  AST 15 - 41 U/L 14(L) - -  ALT 0 - 44 U/L 13 - -       DIAGNOSTIC IMAGING:  I have independently reviewed the scans and discussed with the patient.    ASSESSMENT & PLAN:   Infiltrating lobular carcinoma of right breast in female ([place]) 1.  Right breast invasive lobular carcinoma: -Stereotactic biopsy on 06/18/2019 showed ILC in the right breast lower inner quadrant.  90% positive for ER/PR.  Ki-67 was 5%.  Tumor negative for HER-2. -She refused surgical intervention. -MRI of the breast on 08/24/2019 showed biopsy-proven right breast invasive lobular carcinoma and atypical lobular hyperplasia.  No additional suspicious enhancements. -She was started on anastrozole on 08/24/2019. -She is tolerating it very well.  Denies any major hot flashes or musculoskeletal symptoms. -Physical exam today did not reveal any palpable masses.  No palpable adenopathy. -I have reviewed labs from Dr. Juel Burrow office dated 12/21/2019.  White count is 7.3, hemoglobin 12, platelet count 316.  Absolute eosinophil count was slightly high at 600.  LFTs, calcium was normal. -I have recommended MRI of the breast with and without contrast to evaluate response from anastrozole. -We will see her back after the MRI.  2.  Osteopenia: -DEXA scan in February 2018  showed T score -2.1. -DEXA scan on 07/20/2019 showed T score -1.9. -She will continue calcium and vitamin D supplements.  3.  CKD: -Creatinine is stable around 1.4.      Orders placed this encounter:  Orders Placed This Encounter  Procedures  . MR BREAST RIGHT [place] CAD      Derek Jack, [place] [phone]

## 2020-01-27 ENCOUNTER — Other Ambulatory Visit (HOSPITAL_COMMUNITY): Payer: Self-pay | Admitting: *Deleted

## 2020-01-27 DIAGNOSIS — C50911 Malignant neoplasm of unspecified site of right female breast: Secondary | ICD-10-CM

## 2020-02-02 DIAGNOSIS — R062 Wheezing: Secondary | ICD-10-CM | POA: Diagnosis not present

## 2020-02-02 DIAGNOSIS — R0602 Shortness of breath: Secondary | ICD-10-CM | POA: Diagnosis not present

## 2020-02-08 ENCOUNTER — Ambulatory Visit (HOSPITAL_COMMUNITY): Payer: Medicare Other

## 2020-02-11 ENCOUNTER — Ambulatory Visit (HOSPITAL_COMMUNITY): Payer: Medicare Other | Admitting: Hematology

## 2020-02-19 ENCOUNTER — Ambulatory Visit
Admission: RE | Admit: 2020-02-19 | Discharge: 2020-02-19 | Disposition: A | Discharge status: Home / Self Care | Payer: Medicare Other | Source: Ambulatory Visit | Attending: Hematology | Admitting: Hematology

## 2020-02-19 ENCOUNTER — Other Ambulatory Visit: Payer: Self-pay

## 2020-02-19 DIAGNOSIS — C50211 Malignant neoplasm of upper-inner quadrant of right female breast: Secondary | ICD-10-CM | POA: Diagnosis not present

## 2020-02-19 DIAGNOSIS — C50911 Malignant neoplasm of unspecified site of right female breast: Secondary | ICD-10-CM

## 2020-02-19 IMAGING — MR MR BREAST BILAT WO/W CM
8 of 12 series · 32 of 48 positions shown · IV contrast (gadavist)
Comparison: MRI on [DATE], and earlier exams

CLINICAL DATA: Assess treatment response. Stereotactic guided core
biopsy was performed of the RIGHT breast UPPER INNER QUADRANT
showing atypical lobular hyperplasia marked with a coil clip. Biopsy
of the RIGHT breast LOWER INNER QUADRANT showed grade 2 invasive
lobular carcinoma, marked with an X clip.

LABS:  None obtained at the time of imaging.
EXAM:
BILATERAL BREAST MRI WITH AND WITHOUT CONTRAST
TECHNIQUE: Multiplanar, multisequence MR images of both breasts were obtained
prior to and following the intravenous administration of 10 ml of
Gadavist

[Series 2: t2_tirm_tra ipat (a-p) · axial · 3.0mm · 0.76mm/px · 1 of 55 slices shown]
[im 1/55]
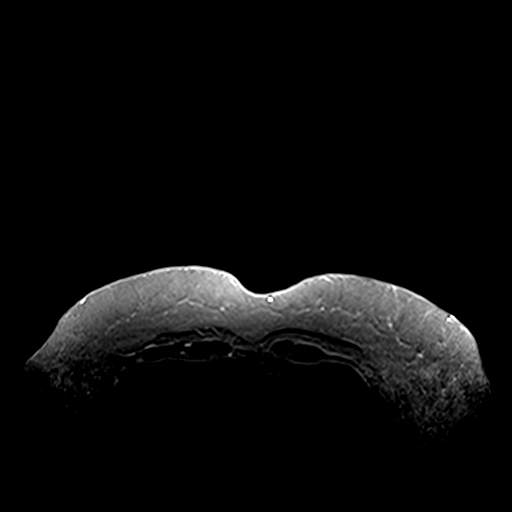

[Series 3: fl3d pre-cm no · axial · non-contrast · 1.2mm · 1.02mm/px · z∈[-53,+118]mm · 5 of 144 slices shown]
[im 1/144]
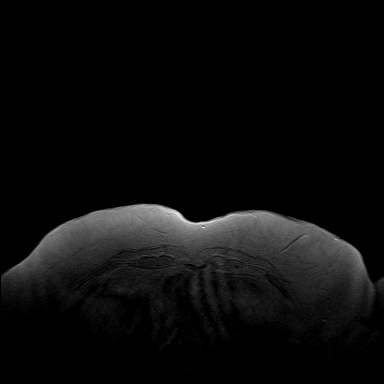
[im 36/144]
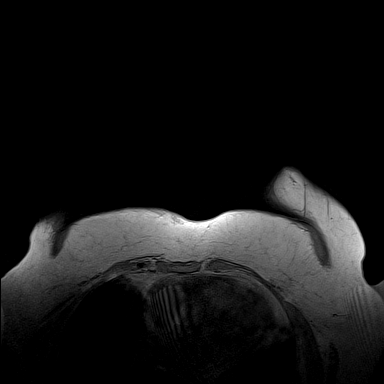
[im 72/144]
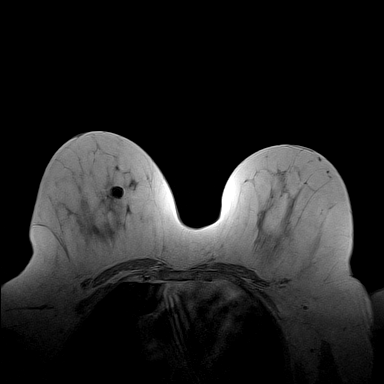
[im 108/144]
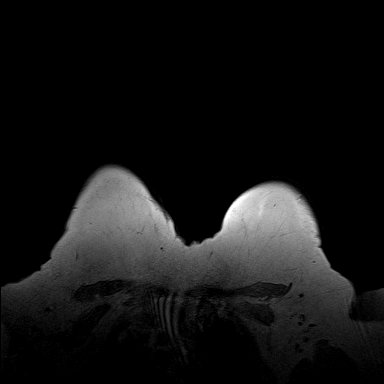
[im 144/144]
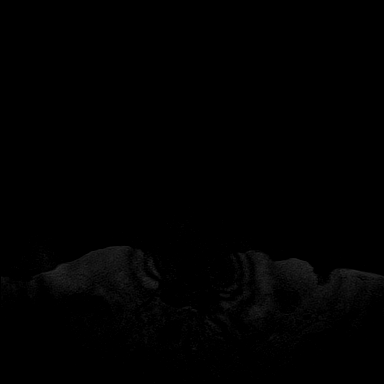

[Series 4: fl3d pre-cm · axial · non-contrast · 1.2mm · 1.02mm/px · z∈[-53,+118]mm · 5 of 144 slices shown]
[im 1/144]
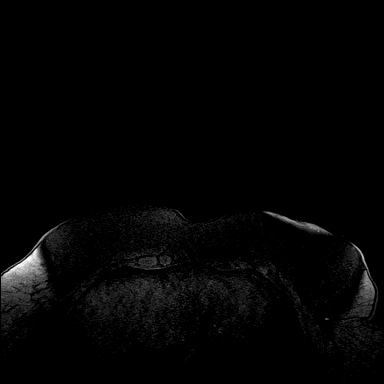
[im 36/144]
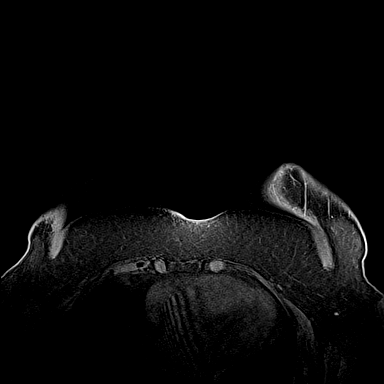
[im 72/144]
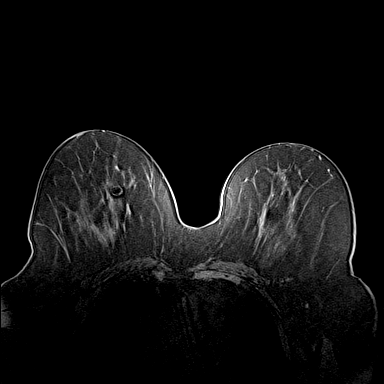
[im 108/144]
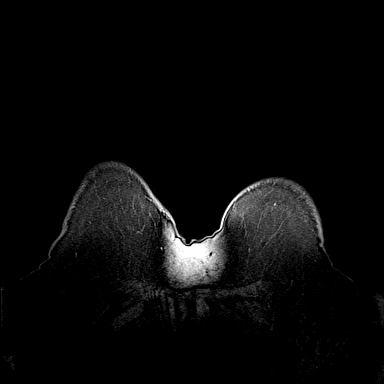
[im 144/144]
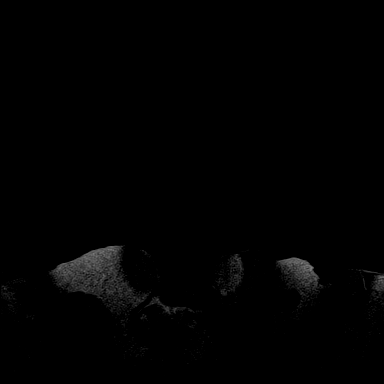

[Series 5: fl3d post-cm 20 · axial · 1.2mm · 1.02mm/px · z∈[-53,+118]mm · 5 of 144 slices shown (1 of 3)]
[im 1/144]
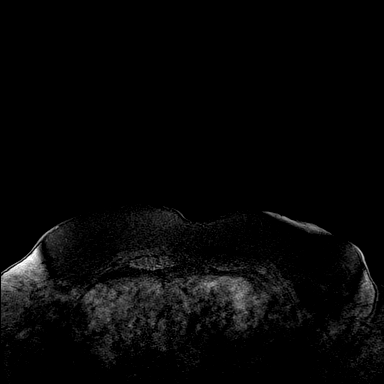
[im 36/144]
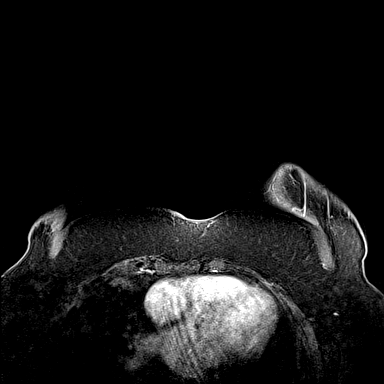
[im 72/144]
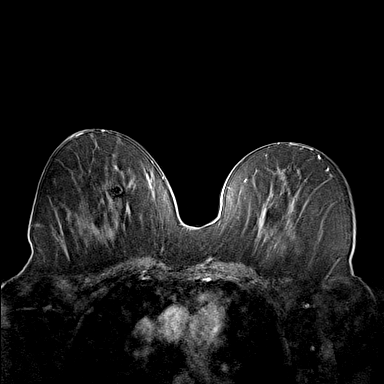
[im 108/144]
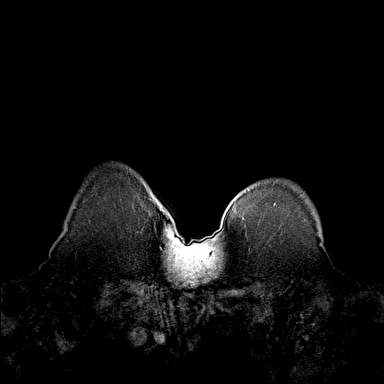
[im 144/144]
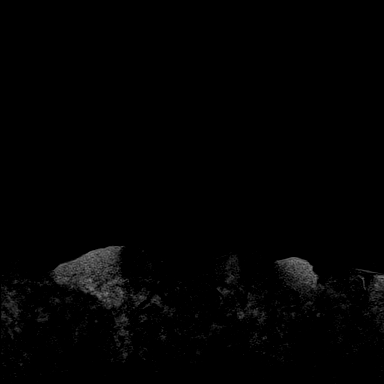

[Series 6: fl3d post-cm 20 · axial · 1.2mm · 1.02mm/px · z∈[-53,+118]mm · 5 of 144 slices shown (2 of 3)]
[im 1/144]
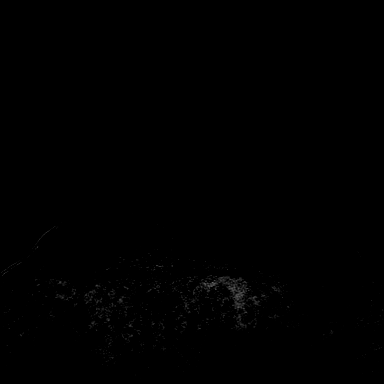
[im 36/144]
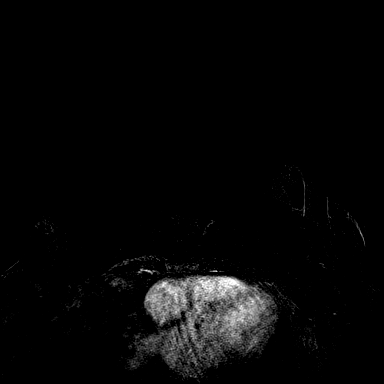
[im 72/144]
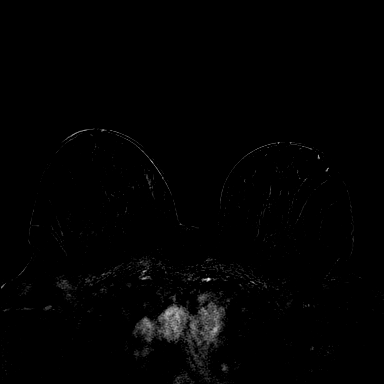
[im 108/144]
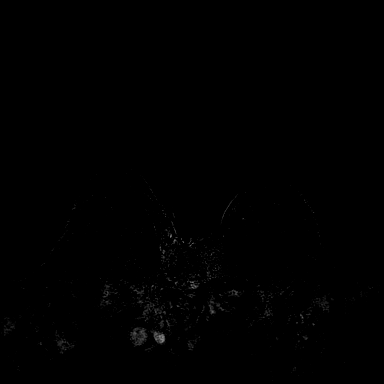
[im 144/144]
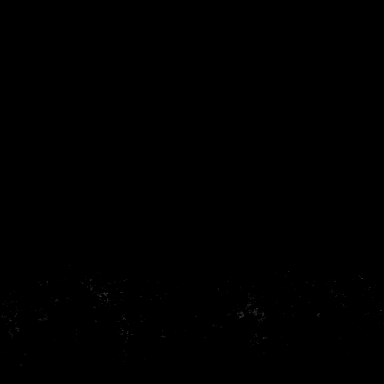

[Series 7: fl3d post-cm 20 · axial · 172.8mm · 1.02mm/px · 1 of 1 slices shown (3 of 3)]
[im 1/1]
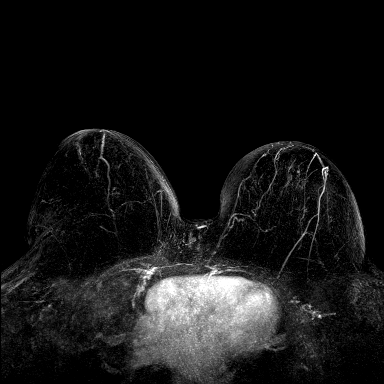

[Series 8: fl3d post-cm 3min · axial · 1.2mm · 1.02mm/px · z∈[-53,+118]mm · 6 of 144 slices shown]
[im 1/144]
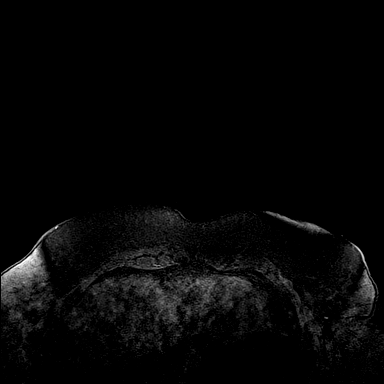
[im 29/144]
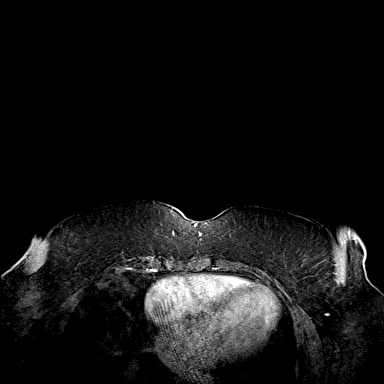
[im 58/144]
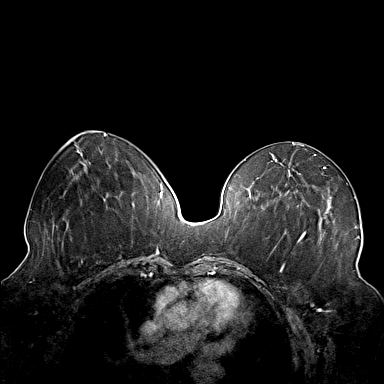
[im 86/144]
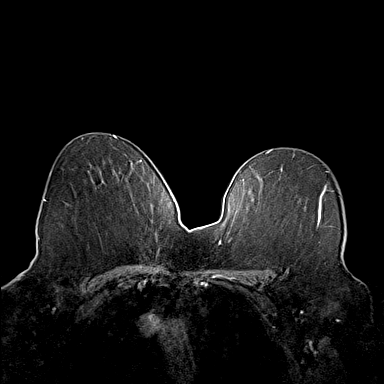
[im 115/144]
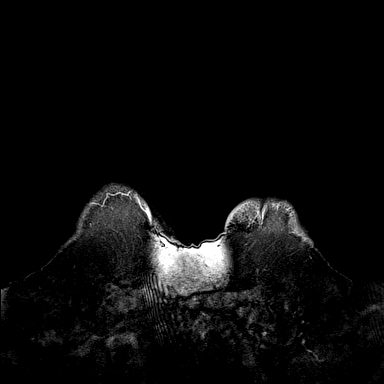
[im 144/144]
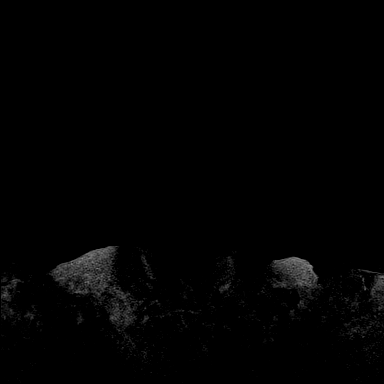

[Series 9: fl3d post-cm 3min_sub · axial · 1.2mm · 1.02mm/px · z∈[-53,+49]mm · 4 of 144 slices shown]
[im 1/144]
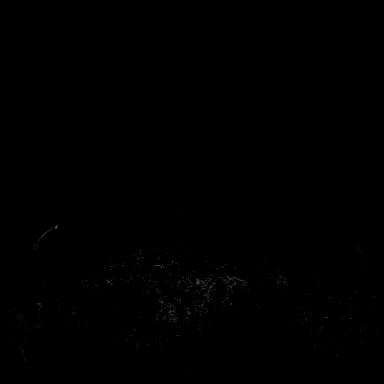
[im 29/144]
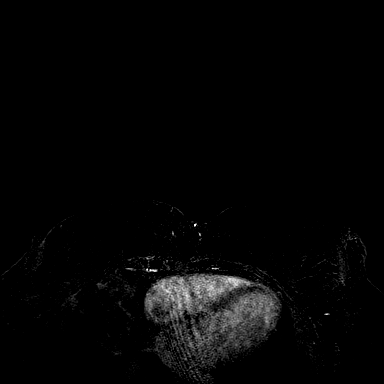
[im 58/144]
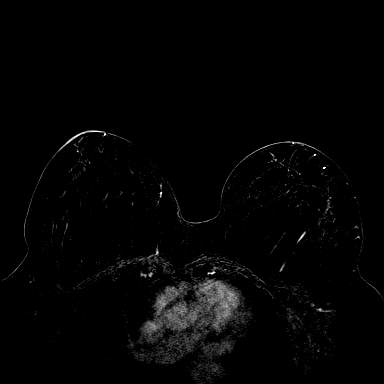
[im 86/144]
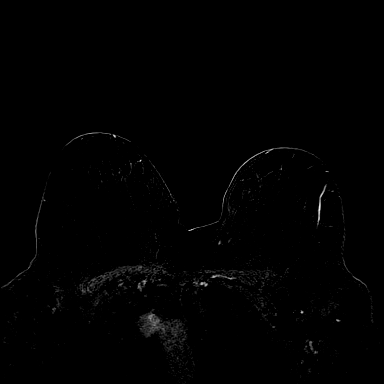

[32 of 48 positions shown; findings below may reference images not displayed]

Three-dimensional MR images were rendered by post-processing of the
original MR data on an independent workstation. The
three-dimensional MR images were interpreted, and findings are
reported in the following complete MRI report for this study. Three
dimensional images were evaluated at the independent [employer]
workstation
FINDINGS: Breast composition: a. Almost entirely fat.

Background parenchymal enhancement: Minimal

Right breast: Within the UPPER INNER QUADRANT, middle depth, there
is tissue marker clip artifact, best seen on image 72 of series 3.
Just MEDIAL to this tissue marker clip, there is a residual area of
enhancement which measures 0.9 x 0.5 centimeters on image 72 of
series 12. Previously, this area of enhancement measured 1.3 x
centimeters. This represents a site of atypical lobular hyperplasia
on biopsy.

The tissue marker clip in the LOWER INNER QUADRANT of the RIGHT
breast is best seen on image 81 of series 3 there within this
location, there is no significant residual enhancement, image 81 of
series 12. This site represents the known invasive lobular
carcinoma.

No new suspicious abnormalities in the RIGHT breast.

Left breast: No mass or abnormal enhancement.

Lymph nodes: No abnormal appearing lymph nodes.

Ancillary findings:  None.
IMPRESSION: 1. Treatment response.
2. Enhancement at the site of atypical lobular hyperplasia in the
UPPER INNER QUADRANT of the RIGHT breast (coil clip) now measures
0.9 x 0.5 centimeters (previously 1.3 x 0.9 centimeters. )
3. Enhancement of the site of known invasive lobular carcinoma in
the LOWER INNER QUADRANT of the RIGHT breast (X clip) has resolved.
(Previously 0.6 x 0.7 x 0.7 centimeters).
4. LEFT breast remains negative. No adenopathy or ancillary
findings.

RECOMMENDATION:
Treatment plan for known RIGHT breast malignancy.

BI-RADS CATEGORY  6: Known biopsy-proven malignancy.

## 2020-02-19 MED ORDER — GADOBUTROL 1 MMOL/ML IV SOLN
10.0000 mL | Freq: Once | INTRAVENOUS | Status: AC | PRN
  Administered 2020-02-19: 10 mL via INTRAVENOUS

## 2020-02-24 ENCOUNTER — Encounter (HOSPITAL_COMMUNITY): Payer: Self-pay | Admitting: Hematology

## 2020-02-24 ENCOUNTER — Other Ambulatory Visit: Payer: Self-pay

## 2020-02-24 ENCOUNTER — Inpatient Hospital Stay (HOSPITAL_COMMUNITY): Payer: Medicare Other | Attending: Hematology | Admitting: Hematology

## 2020-02-24 VITALS — BP 189/79 | HR 65 | Temp 96.0°F | Resp 18 | Wt 229.3 lb

## 2020-02-24 DIAGNOSIS — C50311 Malignant neoplasm of lower-inner quadrant of right female breast: Secondary | ICD-10-CM | POA: Insufficient documentation

## 2020-02-24 DIAGNOSIS — N189 Chronic kidney disease, unspecified: Secondary | ICD-10-CM | POA: Insufficient documentation

## 2020-02-24 DIAGNOSIS — M1990 Unspecified osteoarthritis, unspecified site: Secondary | ICD-10-CM | POA: Diagnosis not present

## 2020-02-24 DIAGNOSIS — E785 Hyperlipidemia, unspecified: Secondary | ICD-10-CM | POA: Insufficient documentation

## 2020-02-24 DIAGNOSIS — I129 Hypertensive chronic kidney disease with stage 1 through stage 4 chronic kidney disease, or unspecified chronic kidney disease: Secondary | ICD-10-CM | POA: Diagnosis not present

## 2020-02-24 DIAGNOSIS — M8580 Other specified disorders of bone density and structure, unspecified site: Secondary | ICD-10-CM | POA: Insufficient documentation

## 2020-02-24 DIAGNOSIS — Z79811 Long term (current) use of aromatase inhibitors: Secondary | ICD-10-CM | POA: Diagnosis not present

## 2020-02-24 DIAGNOSIS — Z79899 Other long term (current) drug therapy: Secondary | ICD-10-CM | POA: Diagnosis not present

## 2020-02-24 DIAGNOSIS — C50911 Malignant neoplasm of unspecified site of right female breast: Secondary | ICD-10-CM | POA: Diagnosis not present

## 2020-02-24 DIAGNOSIS — Z170 Estrogen receptor positive status [ER+]: Secondary | ICD-10-CM | POA: Insufficient documentation

## 2020-02-24 NOTE — Patient Instructions (Addendum)
[facility] at [facility] Discharge Instructions  You were seen today by Dr. Katragadda. He went over your recent lab results. He will see you back in 3 months for labs and follow up.   Thank you for choosing [facility] at [facility] to provide your oncology and hematology care.  To afford each patient quality time with our provider, please arrive at least 15 minutes before your scheduled appointment time.   If you have a lab appointment with the Cancer Center please come in thru the  Main Entrance and check in at the main information desk  You need to re-schedule your appointment should you arrive 10 or more minutes late.  We strive to give you quality time with our providers, and arriving late affects you and other patients whose appointments are after yours.  Also, if you no show three or more times for appointments you may be dismissed from the clinic at the providers discretion.     Again, thank you for choosing [facility].  Our hope is that these requests will decrease the amount of time that you wait before being seen by our physicians.       _____________________________________________________________  Should you have questions after your visit to [facility], please contact our office at [phone] between the hours of 8:00 a.m. and 4:30 p.m.  Voicemails left after 4:00 p.m. will not be returned until the following business day.  For prescription refill requests, have your pharmacy contact our office and allow 72 hours.    Cancer Center Support Programs:   > Cancer Support Group  2nd Tuesday of the month 1pm-2pm, Journey Room    

## 2020-02-24 NOTE — Progress Notes (Signed)
[place], [place] [address]   CLINIC:  Medical Oncology/Hematology  PCP:  Celene Squibb, MD [place] [address] [phone]   REASON FOR VISIT:  Follow-up for right breast cancer.  CURRENT THERAPY: Anastrozole.   INTERVAL HISTORY:  Krista Payne 79 y.o. female seen for follow-up of right breast invasive lobular carcinoma.  She is tolerating anastrozole very well.  Appetite and energy levels are 100%.  No new onset pains reported.  No arthralgias or hot flashes reported.    REVIEW OF SYSTEMS:  Review of Systems  All other systems reviewed and are negative.    PAST MEDICAL/SURGICAL HISTORY:  Past Medical History:  Diagnosis Date  . Chronic kidney disease   . Hyperlipidemia   . Hypertension   . Macular degeneration   . Osteoarthritis    Past Surgical History:  Procedure Laterality Date  . CATARACT EXTRACTION W/PHACO Right 02/07/2015   Procedure: CATARACT EXTRACTION PHACO AND INTRAOCULAR LENS PLACEMENT (IOC);  Surgeon: Tonny Branch, MD;  Location: [facility];  Service: Ophthalmology;  Laterality: Right;  CDE:12.72  . CATARACT EXTRACTION W/PHACO Left 02/24/2015   Procedure: CATARACT EXTRACTION PHACO AND INTRAOCULAR LENS PLACEMENT LEFT EYE;  Surgeon: Tonny Branch, MD;  Location: [facility];  Service: Ophthalmology;  Laterality: Left;  CDE 11.69  . COLONOSCOPY       SOCIAL HISTORY:  Social History   Socioeconomic History  . Marital status: Widowed    Spouse name: Not on file  . Number of children: 3  . Years of education: Not on file  . Highest education level: Not on file  Occupational History  . Not on file  Tobacco Use  . Smoking status: Never Smoker  . Smokeless tobacco: Never Used  Substance and Sexual Activity  . Alcohol use: No  . Drug use: No  . Sexual activity: Never  Other Topics Concern  . Not on file  Social History Narrative  . Not on file   Social Determinants of Health   Financial Resource  Strain: Low Risk   . Difficulty of Paying Living Expenses: Not hard at all  Food Insecurity: No Food Insecurity  . Worried About Charity fundraiser in the Last Year: Never true  . Ran Out of Food in the Last Year: Never true  Transportation Needs: No Transportation Needs  . Lack of Transportation (Medical): No  . Lack of Transportation (Non-Medical): No  Physical Activity: Inactive  . Days of Exercise per Week: 0 days  . Minutes of Exercise per Session: 0 min  Stress: No Stress Concern Present  . Feeling of Stress : Not at all  Social Connections: Somewhat Isolated  . Frequency of Communication with Friends and Family: More than three times a week  . Frequency of Social Gatherings with Friends and Family: More than three times a week  . Attends Religious Services: More than 4 times per year  . Active Member of Clubs or Organizations: No  . Attends Archivist Meetings: Never  . Marital Status: Widowed  Intimate Partner Violence: Not At Risk  . Fear of Current or Ex-Partner: No  . Emotionally Abused: No  . Physically Abused: No  . Sexually Abused: No    FAMILY HISTORY:  Family History  Problem Relation Age of Onset  . Macular degeneration Mother   . Diabetes Father   . Heart disease Daughter     CURRENT MEDICATIONS:  Outpatient Encounter Medications as of  02/24/2020  Medication Sig  . amLODipine (NORVASC) 5 MG tablet Take 5 mg by mouth daily.  [place] Kitchen anastrozole (ARIMIDEX) 1 MG tablet Take 1 tablet (1 mg total) by mouth daily.  [place] Kitchen atenolol (TENORMIN) 25 MG tablet Take by mouth daily.  . Calcium-Vitamin D-Vitamin K (VIACTIV CALCIUM PLUS D) 650-12.5-40 MG-MCG-MCG CHEW Chew by mouth daily. Pt is taking 2 chewables daily  . levocetirizine (XYZAL) 5 MG tablet Take 5 mg by mouth every evening.  . Multiple Vitamins-Minerals (PRESERVISION AREDS PO) Take 1 capsule by mouth 2 (two) times daily.  [place] Kitchen olmesartan (BENICAR) 40 MG tablet Take 40 mg by mouth daily.  . simvastatin (ZOCOR)  20 MG tablet Take 1 tablet (20 mg total) by mouth at bedtime.  Mar[place] Kitchen albuterol (VENTOLIN HFA) 108 (90 Base) MCG/ACT inhaler Inhale 2 puffs into the lungs every 4 (four) hours as needed.  Mar[place] Kitchen buPROPion (WELLBUTRIN XL) 150 MG 24 hr tablet Take 150 mg by mouth daily.  . Melatonin 5 MG SUBL Place 1 tablet under the tongue every evening. PRN  . triamcinolone ointment (KENALOG) 0.5 % Apply topically 2 (two) times daily. Please give pt a 90 day supply (Patient not taking: Reported on 02/24/2020)   No facility-administered encounter medications on file as of 02/24/2020.    ALLERGIES:  Allergies  Allergen Reactions  . Bupropion Hcl     REACTION: unspecified  . Sulfamethoxazole     REACTION: unspecified     PHYSICAL EXAM:  ECOG Performance status: 1  Vitals:   02/24/20 1216  BP: (!) 189/79  Pulse: 65  Resp: 18  Temp: (!) 96 F (35.6 C)  SpO2: 98%   Filed Weights   02/24/20 1216  Weight: 229 lb 4.8 oz (104 kg)    Physical Exam Vitals reviewed.  Constitutional:      Appearance: Normal appearance.  Cardiovascular:     Rate and Rhythm: Normal rate and regular rhythm.     Heart sounds: Normal heart sounds.  Pulmonary:     Effort: Pulmonary effort is normal.     Breath sounds: Normal breath sounds.  Abdominal:     General: There is no distension.     Palpations: Abdomen is soft. There is no mass.  Skin:    General: Skin is warm.  Neurological:     General: No focal deficit present.     Mental Status: She is alert and oriented to person, place, and time.  Psychiatric:        Mood and Affect: Mood normal.        Behavior: Behavior normal.    No masses or adenopathy in the bilateral breast.  LABORATORY DATA:  I have reviewed the labs as listed.  CBC    Component Value Date/Time   WBC 6.7 09/30/2019 1012   RBC 3.74 (L) 09/30/2019 1012   HGB 11.1 (L) 09/30/2019 1012   HCT 35.8 (L) 09/30/2019 1012   PLT 328 09/30/2019 1012   MCV 95.7 09/30/2019 1012   MCH 29.7 09/30/2019  1012   MCHC 31.0 09/30/2019 1012   RDW 12.7 09/30/2019 1012   LYMPHSABS 1.0 09/30/2019 1012   MONOABS 0.6 09/30/2019 1012   EOSABS 0.4 09/30/2019 1012   BASOSABS 0.1 09/30/2019 1012   CMP Latest Ref Rng & Units 09/30/2019 02/03/2015 12/26/2011  Glucose 70 - 99 mg/dL 162(H) 139(H) 135(H)  BUN 8 - 23 mg/dL 34(H) 22 20  Creatinine 0.44 - 1.00 mg/dL 1.35(H) 1.13(H) 1.3(H)  Sodium 135 - 145 mmol/L 138 138 138  Potassium 3.5 - 5.1 mmol/L 5.1 4.8 4.7  Chloride 98 - 111 mmol/L 103 105 104  CO2 22 - 32 mmol/L '24 26 24  '$ Calcium 8.9 - 10.3 mg/dL 9.8 9.8 10.2  Total Protein 6.5 - 8.1 g/dL 7.3 - -  Total Bilirubin 0.3 - 1.2 mg/dL 0.9 - -  Alkaline Phos 38 - 126 U/L 59 - -  AST 15 - 41 U/L 14(L) - -  ALT 0 - 44 U/L 13 - -       DIAGNOSTIC IMAGING:  I have independently reviewed the scan and discussed with the patient.    ASSESSMENT & PLAN:   Infiltrating lobular carcinoma of right breast in female ([facility]) 1.  Right breast invasive lobular carcinoma: -Stereotactic biopsy on 06/18/2019 showed ILC, 90% positive for ER/PR.  Ki-67 5%.  Tumor negative for HER-2. -She refused surgical intervention. -She started anastrozole on 08/24/2019.  She is tolerating it very well. -Physical exam today did not reveal any palpable masses. -She has not had any blood drawn in our clinic as she reportedly had blood work with Dr. [place] Crane recently. -I reviewed MRI of the breast dated 02/19/2020 which showed invasive lobular carcinoma in the lower inner quadrant of the right breast has resolved, previously 0.6 x 0.7 x 0.7 cm.  Enhancement at the site of atypical lobular hyperplasia in the upper inner quadrant of the right breast now measures 0.9 x 0.5 cm, previously 1.3 x 0.9 cm.  No other adenopathy or masses. -I have again discussed with her about definitive curative surgery.  She is not interested in it. -We will see her back in 3 months for follow-up.  I plan to repeat another MRI in 6 months.  We will do blood work at  next visit.  2.  Osteopenia: -DEXA scan on 07/20/2019 shows T score -1.9.  She will continue calcium and vitamin D supplements.  We will plan to repeat DEXA scan in 2 years.  3.  CKD: -Creatinine is stable around 1.4.      Orders placed this encounter:  Orders Placed This Encounter  Procedures  . CBC with Differential  . Comprehensive metabolic panel      Derek Jack, MD [place] [phone]

## 2020-02-24 NOTE — Assessment & Plan Note (Signed)
1.  Right breast invasive lobular carcinoma: -Stereotactic biopsy on 06/18/2019 showed ILC, 90% positive for ER/PR.  Ki-67 5%.  Tumor negative for HER-2. -She refused surgical intervention. -She started anastrozole on 08/24/2019.  She is tolerating it very well. -Physical exam today did not reveal any palpable masses. -She has not had any blood drawn in our clinic as she reportedly had blood work with Dr. Nevada Crane recently. -I reviewed MRI of the breast dated 02/19/2020 which showed invasive lobular carcinoma in the lower inner quadrant of the right breast has resolved, previously 0.6 x 0.7 x 0.7 cm.  Enhancement at the site of atypical lobular hyperplasia in the upper inner quadrant of the right breast now measures 0.9 x 0.5 cm, previously 1.3 x 0.9 cm.  No other adenopathy or masses. -I have again discussed with her about definitive curative surgery.  She is not interested in it. -We will see her back in 3 months for follow-up.  I plan to repeat another MRI in 6 months.  We will do blood work at next visit.  2.  Osteopenia: -DEXA scan on 07/20/2019 shows T score -1.9.  She will continue calcium and vitamin D supplements.  We will plan to repeat DEXA scan in 2 years.  3.  CKD: -Creatinine is stable around 1.4.

## 2020-06-01 ENCOUNTER — Other Ambulatory Visit: Payer: Self-pay

## 2020-06-01 ENCOUNTER — Inpatient Hospital Stay (HOSPITAL_COMMUNITY): Payer: Medicare Other | Attending: Hematology

## 2020-06-01 DIAGNOSIS — E875 Hyperkalemia: Secondary | ICD-10-CM | POA: Diagnosis not present

## 2020-06-01 DIAGNOSIS — Z170 Estrogen receptor positive status [ER+]: Secondary | ICD-10-CM | POA: Insufficient documentation

## 2020-06-01 DIAGNOSIS — M8580 Other specified disorders of bone density and structure, unspecified site: Secondary | ICD-10-CM | POA: Diagnosis not present

## 2020-06-01 DIAGNOSIS — Z79899 Other long term (current) drug therapy: Secondary | ICD-10-CM | POA: Diagnosis not present

## 2020-06-01 DIAGNOSIS — Z79811 Long term (current) use of aromatase inhibitors: Secondary | ICD-10-CM | POA: Insufficient documentation

## 2020-06-01 DIAGNOSIS — C50911 Malignant neoplasm of unspecified site of right female breast: Secondary | ICD-10-CM | POA: Diagnosis not present

## 2020-06-01 DIAGNOSIS — N189 Chronic kidney disease, unspecified: Secondary | ICD-10-CM | POA: Insufficient documentation

## 2020-06-01 LAB — COMPREHENSIVE METABOLIC PANEL
ALT: 14 U/L (ref 0–44)
AST: 15 U/L (ref 15–41)
Albumin: 4.2 g/dL (ref 3.5–5.0)
Alkaline Phosphatase: 56 U/L (ref 38–126)
Anion gap: 8 (ref 5–15)
BUN: 29 mg/dL — ABNORMAL HIGH (ref 8–23)
CO2: 22 mmol/L (ref 22–32)
Calcium: 9.4 mg/dL (ref 8.9–10.3)
Chloride: 105 mmol/L (ref 98–111)
Creatinine, Ser: 1.35 mg/dL — ABNORMAL HIGH (ref 0.44–1.00)
GFR calc Af Amer: 43 mL/min — ABNORMAL LOW (ref >60)
GFR calc non Af Amer: 37 mL/min — ABNORMAL LOW (ref >60)
Glucose, Bld: 140 mg/dL — ABNORMAL HIGH (ref 70–99)
Potassium: 5.8 mmol/L — ABNORMAL HIGH (ref 3.5–5.1)
Sodium: 135 mmol/L (ref 135–145)
Total Bilirubin: 0.6 mg/dL (ref 0.3–1.2)
Total Protein: 7.4 g/dL (ref 6.5–8.1)

## 2020-06-01 LAB — CBC WITH DIFFERENTIAL/PLATELET
Abs Immature Granulocytes: 0.03 10*3/uL (ref 0.00–0.07)
Basophils Absolute: 0 10*3/uL (ref 0.0–0.1)
Basophils Relative: 1 %
Eosinophils Absolute: 0.4 10*3/uL (ref 0.0–0.5)
Eosinophils Relative: 6 %
HCT: 34.7 % — ABNORMAL LOW (ref 36.0–46.0)
Hemoglobin: 10.9 g/dL — ABNORMAL LOW (ref 12.0–15.0)
Immature Granulocytes: 0 %
Lymphocytes Relative: 13 %
Lymphs Abs: 1 10*3/uL (ref 0.7–4.0)
MCH: 30.1 pg (ref 26.0–34.0)
MCHC: 31.4 g/dL (ref 30.0–36.0)
MCV: 95.9 fL (ref 80.0–100.0)
Monocytes Absolute: 0.6 10*3/uL (ref 0.1–1.0)
Monocytes Relative: 8 %
Neutro Abs: 5.6 10*3/uL (ref 1.7–7.7)
Neutrophils Relative %: 72 %
Platelets: 317 10*3/uL (ref 150–400)
RBC: 3.62 MIL/uL — ABNORMAL LOW (ref 3.87–5.11)
RDW: 12.9 % (ref 11.5–15.5)
WBC: 7.7 10*3/uL (ref 4.0–10.5)
nRBC: 0 % (ref 0.0–0.2)

## 2020-06-08 ENCOUNTER — Other Ambulatory Visit: Payer: Self-pay

## 2020-06-08 ENCOUNTER — Inpatient Hospital Stay (HOSPITAL_BASED_OUTPATIENT_CLINIC_OR_DEPARTMENT_OTHER): Payer: Medicare Other | Admitting: Hematology

## 2020-06-08 VITALS — BP 184/57 | HR 71 | Temp 97.1°F | Resp 20 | Wt 229.7 lb

## 2020-06-08 DIAGNOSIS — M8580 Other specified disorders of bone density and structure, unspecified site: Secondary | ICD-10-CM | POA: Diagnosis not present

## 2020-06-08 DIAGNOSIS — Z79811 Long term (current) use of aromatase inhibitors: Secondary | ICD-10-CM | POA: Diagnosis not present

## 2020-06-08 DIAGNOSIS — N189 Chronic kidney disease, unspecified: Secondary | ICD-10-CM | POA: Diagnosis not present

## 2020-06-08 DIAGNOSIS — C50911 Malignant neoplasm of unspecified site of right female breast: Secondary | ICD-10-CM | POA: Diagnosis not present

## 2020-06-08 DIAGNOSIS — Z170 Estrogen receptor positive status [ER+]: Secondary | ICD-10-CM | POA: Diagnosis not present

## 2020-06-08 DIAGNOSIS — E875 Hyperkalemia: Secondary | ICD-10-CM | POA: Diagnosis not present

## 2020-06-08 MED ORDER — SODIUM POLYSTYRENE SULFONATE 15 GM/60ML PO SUSP: 30.0000 g | Freq: Once | ORAL | 0 refills | Status: AC

## 2020-06-08 NOTE — Progress Notes (Signed)
 [facility] [address] [place], [place] [address]   Patient Care Team: Hall, John Z, MD as PCP - General (Internal Medicine)  SUMMARY OF ONCOLOGIC HISTORY: Oncology History   No history exists.    CHIEF COMPLIANT: right breast cancer   INTERVAL HISTORY: [name] is a 79 y.o. female here today for follow up of her right breast cancer. Her last visit was on 01/25/2020.  Today she reports that she is taking the Arimidex and is tolerating it well. She also takes calcium and vitamin D, but not consistently.  She is not followed by a nephrologist.   REVIEW OF SYSTEMS:   Review of Systems  Constitutional: Negative for appetite change and fatigue.  All other systems reviewed and are negative.   I have reviewed the past medical history, past surgical history, social history and family history with the patient and they are unchanged from previous note.   ALLERGIES:   is allergic to bupropion hcl and sulfamethoxazole.   MEDICATIONS:  Current Outpatient Medications  Medication Sig Dispense Refill  . amLODipine (NORVASC) 5 MG tablet Take 5 mg by mouth daily.    . anastrozole (ARIMIDEX) 1 MG tablet Take 1 tablet (1 mg total) by mouth daily. 90 tablet 3  . atenolol (TENORMIN) 25 MG tablet Take by mouth daily.    . buPROPion (WELLBUTRIN XL) 150 MG 24 hr tablet Take 150 mg by mouth daily.    . Calcium-Vitamin D-Vitamin K (VIACTIV CALCIUM PLUS D) 650-12.5-40 MG-MCG-MCG CHEW Chew by mouth daily. Pt is taking 2 chewables daily    . levocetirizine (XYZAL) 5 MG tablet Take 5 mg by mouth every evening.    . Melatonin 5 MG SUBL Place 1 tablet under the tongue every evening. PRN    . Multiple Vitamins-Minerals (PRESERVISION AREDS PO) Take 1 capsule by mouth 2 (two) times daily.    . olmesartan (BENICAR) 40 MG tablet Take 40 mg by mouth daily.    . simvastatin (ZOCOR) 20 MG tablet Take 1 tablet (20 mg total) by mouth at bedtime. 90 tablet 3  . triamcinolone ointment  (KENALOG) 0.5 % Apply topically 2 (two) times daily. Please give pt a 90 day supply 90 g 1   No current facility-administered medications for this visit.     PHYSICAL EXAMINATION: Performance status (ECOG): 1 - Symptomatic but completely ambulatory  Vitals:   06/08/20 1458  BP: (!) 184/57  Pulse: 71  Resp: 20  Temp: (!) 97.1 F (36.2 C)  SpO2: 97%   Wt Readings from Last 3 Encounters:  06/08/20 229 lb 11.2 oz (104.2 kg)  02/24/20 229 lb 4.8 oz (104 kg)  01/25/20 229 lb 1 oz (103.9 kg)   Physical Exam Vitals reviewed.  Constitutional:      Appearance: Normal appearance. She is obese.  Cardiovascular:     Rate and Rhythm: Normal rate and regular rhythm.     Pulses: Normal pulses.     Heart sounds: Normal heart sounds.  Pulmonary:     Effort: Pulmonary effort is normal.     Breath sounds: Normal breath sounds.  Chest:     Breasts:        Right: Normal. No mass or skin change.        Left: Normal. No mass or skin change.  Abdominal:     Palpations: Abdomen is soft. There is no mass.     Tenderness: There is no abdominal tenderness.  Lymphadenopathy:       Upper Body:     Right upper body: No supraclavicular or axillary adenopathy.     Left upper body: No supraclavicular or axillary adenopathy.  Neurological:     General: No focal deficit present.     Mental Status: She is alert and oriented to person, place, and time.  Psychiatric:        Mood and Affect: Mood normal.        Behavior: Behavior normal.     Breast Exam Chaperone: Daniel Khashchuk, MD     LABORATORY DATA:  I have reviewed the data as listed CMP Latest Ref Rng & Units 06/01/2020 09/30/2019 02/03/2015  Glucose 70 - 99 mg/dL 140(H) 162(H) 139(H)  BUN 8 - 23 mg/dL 29(H) 34(H) 22  Creatinine 0.44 - 1.00 mg/dL 1.35(H) 1.35(H) 1.13(H)  Sodium 135 - 145 mmol/L 135 138 138  Potassium 3.5 - 5.1 mmol/L 5.8(H) 5.1 4.8  Chloride 98 - 111 mmol/L 105 103 105  CO2 22 - 32 mmol/L 22 24 26  Calcium 8.9 - 10.3 mg/dL  9.4 9.8 9.8  Total Protein 6.5 - 8.1 g/dL 7.4 7.3 -  Total Bilirubin 0.3 - 1.2 mg/dL 0.6 0.9 -  Alkaline Phos 38 - 126 U/L 56 59 -  AST 15 - 41 U/L 15 14(L) -  ALT 0 - 44 U/L 14 13 -   No results found for: CAN153 Lab Results  Component Value Date   WBC 7.7 06/01/2020   HGB 10.9 (L) 06/01/2020   HCT 34.7 (L) 06/01/2020   MCV 95.9 06/01/2020   PLT 317 06/01/2020   NEUTROABS 5.6 06/01/2020    ASSESSMENT:  1.  Right breast invasive lobular carcinoma: -Stereotactic biopsy on 06/18/2019 showed ILC in the right breast lower inner quadrant.  90% positive for ER/PR.  Ki-67 was 5%.  Tumor negative for HER-2. -She refused surgical intervention. -MRI of the breast on 08/24/2019 showed biopsy-proven right breast invasive lobular carcinoma and atypical lobular hyperplasia.  No additional suspicious enhancements. -She was started on anastrozole on 08/24/2019. -MRI of the breast on 02/19/2020 showed treatment response with enhancement at the site of atypical lobular hyperplasia in the upper inner quadrant of the right breast measuring 0.9 x 0.5 cm (previously 1.3 x 0.9 cm).  Enhancement at the site of known invasive lobular carcinoma in the lower inner quadrant of the right breast has resolved completely.  Left breast remains negative.  No adenopathy.  2.  Osteopenia: -DEXA scan on 07/20/2019 shows T score of -1.9.  3.  CKD: -Creatinine is stable around 1.4.   PLAN:  1.  Right breast invasive lobular carcinoma: -She is continuing to tolerate anastrozole very well. -Physical exam today did not reveal any palpable masses. -I have reviewed her labs which showed normal LFTs.  CBC was grossly normal. -I plan to see her back in 3 months for follow-up.  I plan to repeat MRI of the breast with and without contrast prior to next visit.  2.  Osteopenia: -Continue calcium and vitamin D supplements.  Will check vitamin D level at next visit.  3.  CKD: -I have recommended plenty of fluids.  4.   Hyperkalemia: -I have reviewed all her medications.  Likely preservation multivitamin has some potassium in it.  I have told her to read the label and discontinue it if it has potassium.  Today we will give her Kayexalate. -If potassium continues to be high, will make a referral to Dr. Bhutani.   No orders of the defined types   were placed in this encounter.  The patient has a good understanding of the overall plan. she agrees with it. she will call with any problems that may develop before the next visit here.     , MD [facility] [phone]   I, Daniel Khashchuk, am acting as a scribe for Dr.  Katagadda.  I,   MD, have reviewed the above documentation for accuracy and completeness, and I agree with the above.     

## 2020-06-08 NOTE — Patient Instructions (Addendum)
[facility] at [facility] Discharge Instructions  You were seen today by Dr. Delton Coombes. He went over your recent results. Please check the Preservision bottle to see if it has potassium in the ingredients list; if it does, stop taking it. You will be prescribed kayexalate liquid to lower your potassium levels. You will be scheduled for an MRI of both breasts. Dr. Delton Coombes will see you back in 3 months for labs and follow up.   Thank you for choosing [facility] at [facility] to provide your oncology and hematology care.  To afford each patient quality time with our provider, please arrive at least 15 minutes before your scheduled appointment time.   If you have a lab appointment with the [facility] please come in thru the Main Entrance and check in at the main information desk  You need to re-schedule your appointment should you arrive 10 or more minutes late.  We strive to give you quality time with our providers, and arriving late affects you and other patients whose appointments are after yours.  Also, if you no show three or more times for appointments you may be dismissed from the clinic at the providers discretion.     Again, thank you for choosing [facility].  Our hope is that these requests will decrease the amount of time that you wait before being seen by our physicians.       _____________________________________________________________  Should you have questions after your visit to [facility], please contact our office at [phone] between the hours of 8:00 a.m. and 4:30 p.m.  Voicemails left after 4:00 p.m. will not be returned until the following business day.  For prescription refill requests, have your pharmacy contact our office and allow 72 hours.    Cancer Center Support Programs:   > Cancer Support Group  2nd Tuesday of the month 1pm-2pm, Journey Room

## 2020-07-07 DIAGNOSIS — H43813 Vitreous degeneration, bilateral: Secondary | ICD-10-CM | POA: Diagnosis not present

## 2020-07-07 DIAGNOSIS — H35033 Hypertensive retinopathy, bilateral: Secondary | ICD-10-CM | POA: Diagnosis not present

## 2020-07-07 DIAGNOSIS — H3540 Unspecified peripheral retinal degeneration: Secondary | ICD-10-CM | POA: Diagnosis not present

## 2020-07-07 DIAGNOSIS — H353231 Exudative age-related macular degeneration, bilateral, with active choroidal neovascularization: Secondary | ICD-10-CM | POA: Diagnosis not present

## 2020-07-26 ENCOUNTER — Other Ambulatory Visit (HOSPITAL_COMMUNITY): Payer: Self-pay | Admitting: Hematology

## 2020-07-26 DIAGNOSIS — C801 Malignant (primary) neoplasm, unspecified: Secondary | ICD-10-CM

## 2020-08-01 DIAGNOSIS — Z1889 Other specified retained foreign body fragments: Secondary | ICD-10-CM | POA: Diagnosis not present

## 2020-08-01 DIAGNOSIS — L97919 Non-pressure chronic ulcer of unspecified part of right lower leg with unspecified severity: Secondary | ICD-10-CM | POA: Diagnosis not present

## 2020-08-01 DIAGNOSIS — D0511 Intraductal carcinoma in situ of right breast: Secondary | ICD-10-CM | POA: Diagnosis not present

## 2020-08-01 DIAGNOSIS — I1310 Hypertensive heart and chronic kidney disease without heart failure, with stage 1 through stage 4 chronic kidney disease, or unspecified chronic kidney disease: Secondary | ICD-10-CM | POA: Diagnosis not present

## 2020-08-01 DIAGNOSIS — N1831 Chronic kidney disease, stage 3a: Secondary | ICD-10-CM | POA: Diagnosis not present

## 2020-08-01 DIAGNOSIS — S81801A Unspecified open wound, right lower leg, initial encounter: Secondary | ICD-10-CM | POA: Diagnosis not present

## 2020-08-01 DIAGNOSIS — H3530 Unspecified macular degeneration: Secondary | ICD-10-CM | POA: Diagnosis not present

## 2020-08-01 DIAGNOSIS — H409 Unspecified glaucoma: Secondary | ICD-10-CM | POA: Diagnosis not present

## 2020-08-01 DIAGNOSIS — D0501 Lobular carcinoma in situ of right breast: Secondary | ICD-10-CM | POA: Diagnosis not present

## 2020-08-01 DIAGNOSIS — F419 Anxiety disorder, unspecified: Secondary | ICD-10-CM | POA: Diagnosis not present

## 2020-08-01 DIAGNOSIS — M8580 Other specified disorders of bone density and structure, unspecified site: Secondary | ICD-10-CM | POA: Diagnosis not present

## 2020-08-01 DIAGNOSIS — R928 Other abnormal and inconclusive findings on diagnostic imaging of breast: Secondary | ICD-10-CM | POA: Diagnosis not present

## 2020-08-02 ENCOUNTER — Encounter (HOSPITAL_COMMUNITY): Payer: Medicare Other

## 2020-08-02 ENCOUNTER — Ambulatory Visit (HOSPITAL_COMMUNITY): Payer: Medicare Other

## 2020-08-04 DIAGNOSIS — C50911 Malignant neoplasm of unspecified site of right female breast: Secondary | ICD-10-CM | POA: Diagnosis not present

## 2020-08-04 DIAGNOSIS — F419 Anxiety disorder, unspecified: Secondary | ICD-10-CM | POA: Diagnosis not present

## 2020-08-04 DIAGNOSIS — E875 Hyperkalemia: Secondary | ICD-10-CM | POA: Diagnosis not present

## 2020-08-04 DIAGNOSIS — E1122 Type 2 diabetes mellitus with diabetic chronic kidney disease: Secondary | ICD-10-CM | POA: Diagnosis not present

## 2020-08-04 DIAGNOSIS — I10 Essential (primary) hypertension: Secondary | ICD-10-CM | POA: Diagnosis not present

## 2020-08-04 DIAGNOSIS — J302 Other seasonal allergic rhinitis: Secondary | ICD-10-CM | POA: Diagnosis not present

## 2020-08-04 DIAGNOSIS — N1831 Chronic kidney disease, stage 3a: Secondary | ICD-10-CM | POA: Diagnosis not present

## 2020-08-04 DIAGNOSIS — E782 Mixed hyperlipidemia: Secondary | ICD-10-CM | POA: Diagnosis not present

## 2020-08-04 DIAGNOSIS — Z23 Encounter for immunization: Secondary | ICD-10-CM | POA: Diagnosis not present

## 2020-08-04 DIAGNOSIS — I83012 Varicose veins of right lower extremity with ulcer of calf: Secondary | ICD-10-CM | POA: Diagnosis not present

## 2020-08-04 DIAGNOSIS — Z634 Disappearance and death of family member: Secondary | ICD-10-CM | POA: Diagnosis not present

## 2020-08-04 DIAGNOSIS — R600 Localized edema: Secondary | ICD-10-CM | POA: Diagnosis not present

## 2020-08-11 ENCOUNTER — Other Ambulatory Visit (HOSPITAL_COMMUNITY): Payer: Medicare Other

## 2020-08-11 ENCOUNTER — Encounter (HOSPITAL_COMMUNITY): Payer: Medicare Other

## 2020-08-16 ENCOUNTER — Encounter (HOSPITAL_COMMUNITY): Payer: Medicare Other

## 2020-08-16 ENCOUNTER — Other Ambulatory Visit (HOSPITAL_COMMUNITY): Payer: Medicare Other

## 2020-08-16 ENCOUNTER — Ambulatory Visit (HOSPITAL_COMMUNITY)
Admission: RE | Admit: 2020-08-16 | Discharge: 2020-08-16 | Disposition: A | Discharge status: Home / Self Care | Payer: Medicare Other | Source: Ambulatory Visit | Attending: Hematology | Admitting: Hematology

## 2020-08-16 ENCOUNTER — Other Ambulatory Visit: Payer: Self-pay

## 2020-08-16 ENCOUNTER — Other Ambulatory Visit (HOSPITAL_COMMUNITY): Payer: Self-pay | Admitting: *Deleted

## 2020-08-16 DIAGNOSIS — C50911 Malignant neoplasm of unspecified site of right female breast: Secondary | ICD-10-CM

## 2020-08-16 DIAGNOSIS — C801 Malignant (primary) neoplasm, unspecified: Secondary | ICD-10-CM

## 2020-08-16 DIAGNOSIS — R928 Other abnormal and inconclusive findings on diagnostic imaging of breast: Secondary | ICD-10-CM | POA: Diagnosis not present

## 2020-08-16 IMAGING — MG DIGITAL DIAGNOSTIC BILAT W/ TOMO W/ CAD
8 series · 8 of 24 positions shown · non-contrast
Comparison: Previous exams, including breast MRI dated [DATE].

CLINICAL DATA: 79-year-old female with history of grade 2 invasive
lobular carcinoma post stereotactic guided biopsy of a mass in the
inner right breast adjacent to x shaped biopsy marking clip. In
addition, coil shaped biopsy marking clip marks site of biopsied
atypical lobular hyperplasia. The patient is currently taking
anastrozole. She is doing well and reports no problems today.

EXAM:
DIGITAL DIAGNOSTIC BILATERAL MAMMOGRAM WITH TOMO AND CAD

[L CC synth-2D]
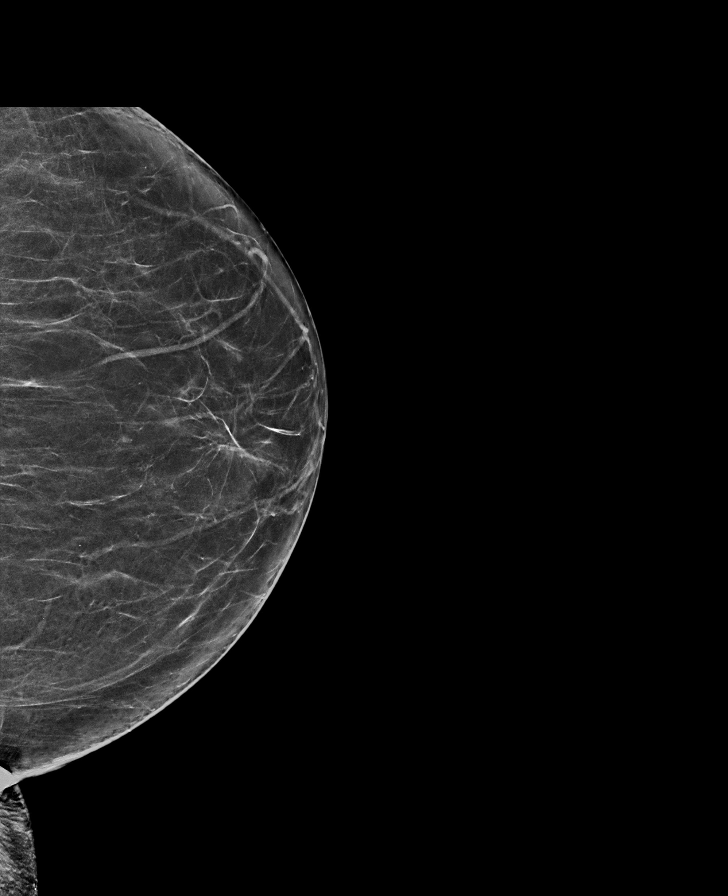

[R MLO synth-2D]
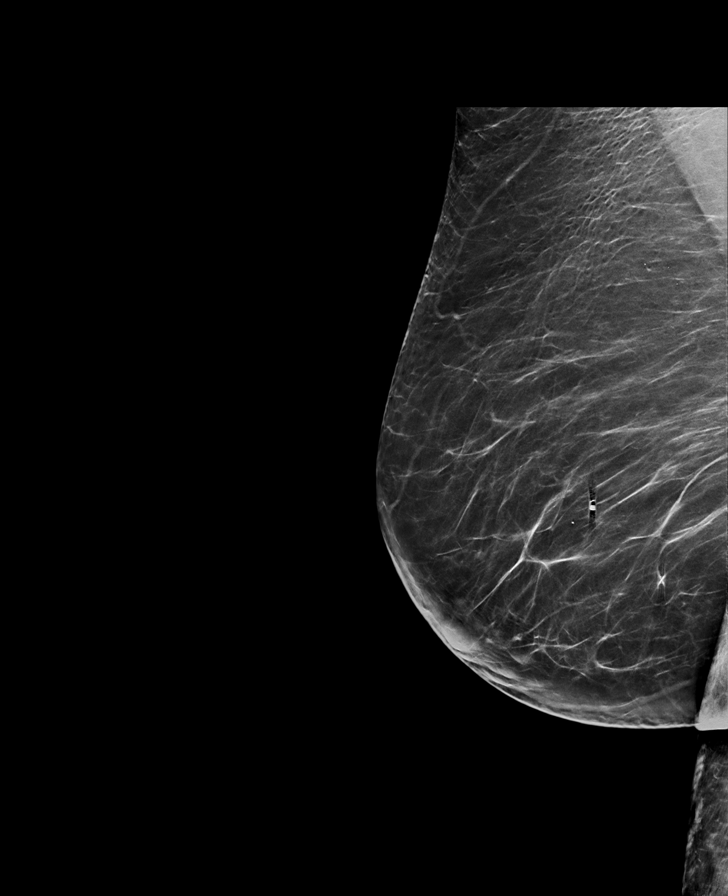

[R CC synth-2D]
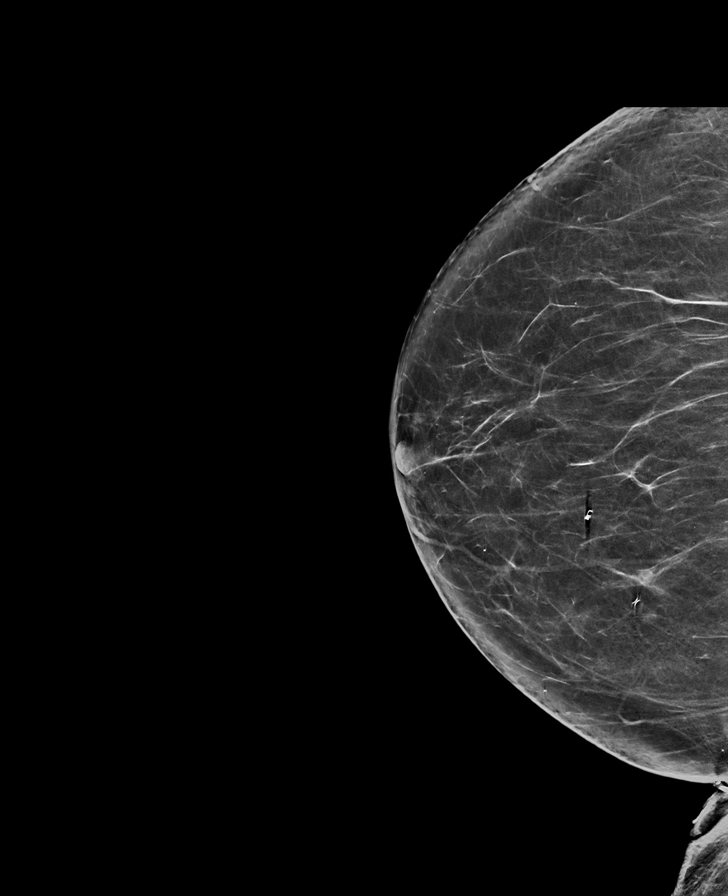

[L MLO synth-2D]
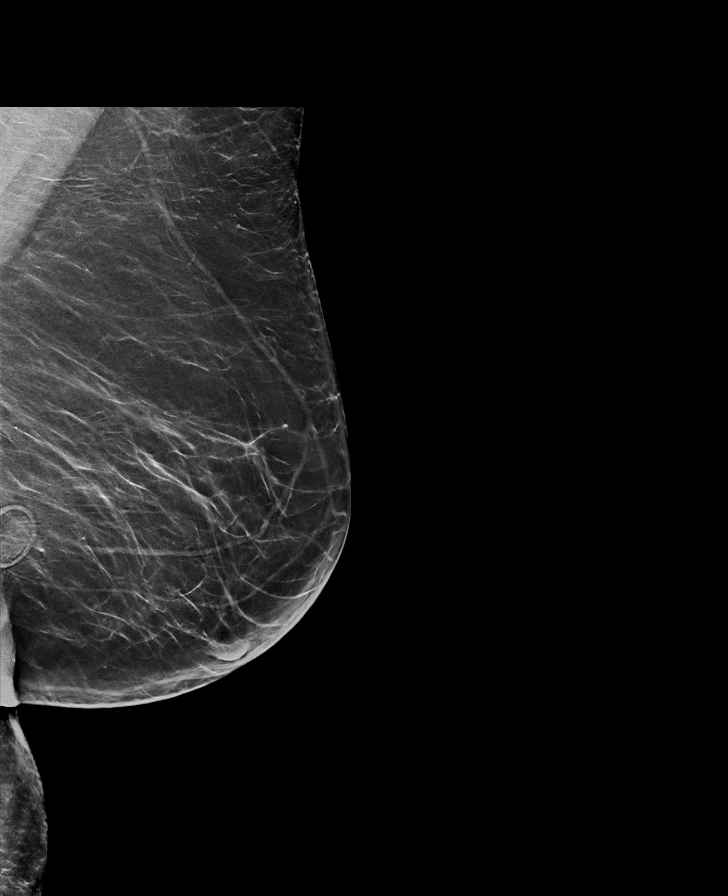

[L CC tomo · tomo slice 33/66.0]
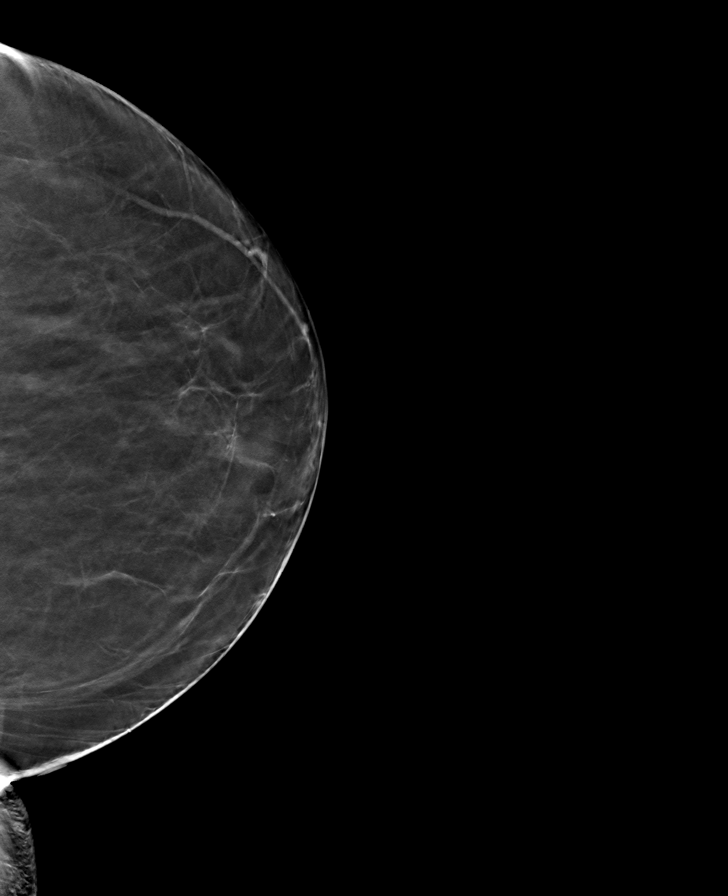

[L MLO tomo · tomo slice 40/79.0]
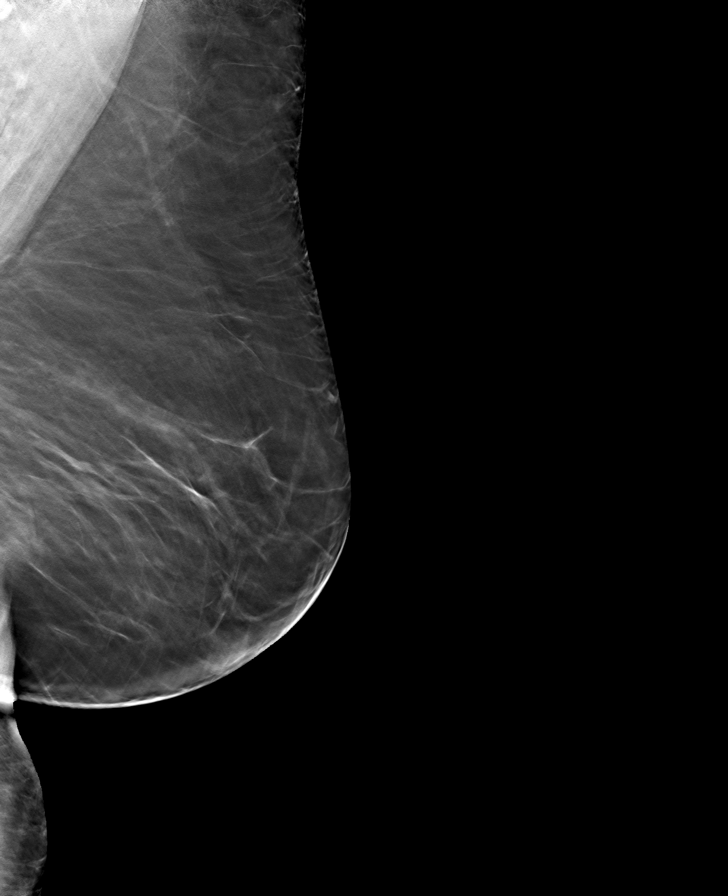

[R MLO tomo · tomo slice 39/78.0]
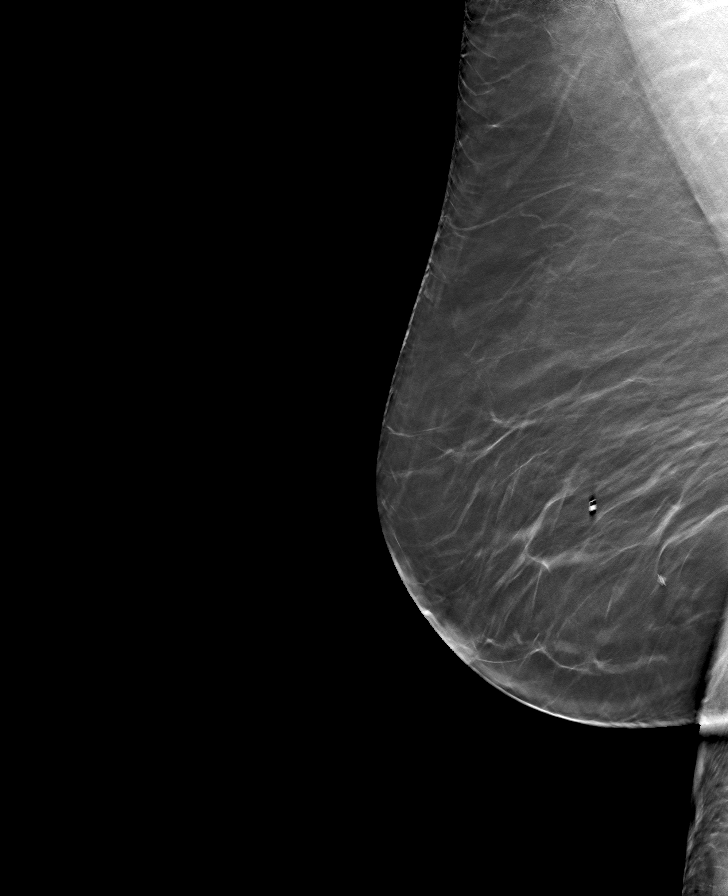

[R CC tomo · tomo slice 35/69.0]
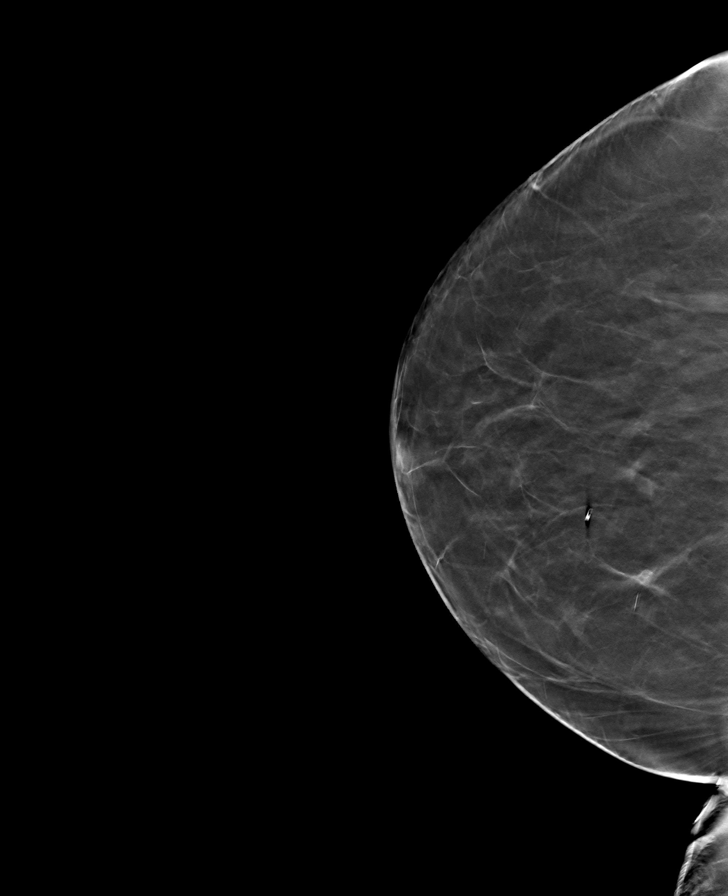

[8 of 24 positions shown; findings below may reference images not displayed]

ACR Breast Density Category b: There are scattered areas of
fibroglandular density.
FINDINGS: The 0.5 cm spiculated mass in the inner right breast at site of
biopsy-proven invasive lobular carcinoma appears stable in size and
appearance when compared to the prior exam from [DATE]. The X
shaped biopsy marking clip is located slightly medial and inferior
to this mass. No suspicious masses or abnormality seen at site of
the coil shaped biopsy marking clip site of biopsy proven atypical
lobular hyperplasia. No new masses or abnormality seen in the right
breast. No suspicious masses or calcifications seen in the left
breast.

Mammographic images were processed with CAD.
IMPRESSION: 1. Stable appearance of the right breast, with 0.5 cm biopsy-proven
malignancy unchanged in size in appearance when compared to [DATE]. No new masses or suspicious abnormalities seen in the right
breast.

2.  No mammographic evidence of malignancy in the left breast.

RECOMMENDATION:
Treatment plan for known right breast malignancy.

I have discussed the findings and recommendations with the patient.
If applicable, a reminder letter will be sent to the patient
regarding the next appointment.

BI-RADS CATEGORY  6: Known biopsy-proven malignancy.

## 2020-08-29 ENCOUNTER — Other Ambulatory Visit: Payer: Self-pay

## 2020-08-29 ENCOUNTER — Ambulatory Visit
Admission: RE | Admit: 2020-08-29 | Discharge: 2020-08-29 | Disposition: A | Discharge status: Home / Self Care | Payer: Medicare Other | Source: Ambulatory Visit | Attending: Hematology | Admitting: Hematology

## 2020-08-29 DIAGNOSIS — Z853 Personal history of malignant neoplasm of breast: Secondary | ICD-10-CM | POA: Diagnosis not present

## 2020-08-29 DIAGNOSIS — C50911 Malignant neoplasm of unspecified site of right female breast: Secondary | ICD-10-CM

## 2020-08-29 IMAGING — MR MR BREAST BILAT WO/W CM
19 of 27 series · 31 of 48 positions shown · IV contrast (10 GADAVIST)
Comparison: Previous exam(s).

CLINICAL DATA: 79-year-old with a personal history of malignant
lumpectomy of the LOWER INNER QUADRANT of the RIGHT breast in
[DATE] for grade 2 invasive lobular carcinoma. She also had
biopsy of an asymmetry in the INNER RIGHT breast which demonstrated
lobular neoplasia (ALH). High risk supplemental MRI is performed.

LABS:  Not applicable.
EXAM:
BILATERAL BREAST MRI WITH AND WITHOUT CONTRAST
TECHNIQUE: Multiplanar, multisequence MR images of both breasts were obtained
prior to and following the intravenous administration of 10 ml of
Gadavist.

[Series 2: t2_tirm_tra ipat (a-p) · axial · 3.0mm · 0.82mm/px · 1 of 42 slices shown (1 of 2)]
[im 1/42]
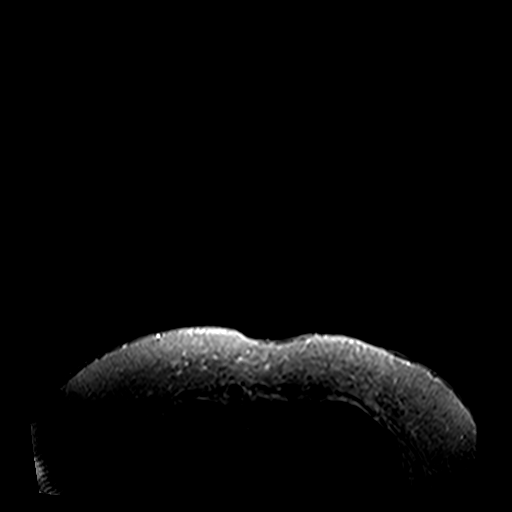

[Series 3: fl3d pre-cm no · axial · non-contrast · 1.2mm · 1.09mm/px · z∈[-73,+117]mm · 2 of 160 slices shown]
[im 1/160]
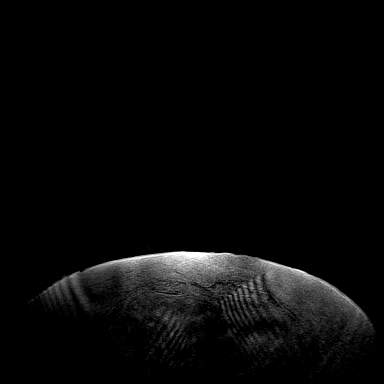
[im 160/160]
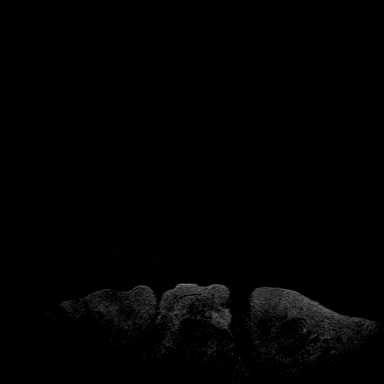

[Series 4: t2_tirm_tra ipat (a-p) · axial · 3.0mm · 0.82mm/px · 1 of 63 slices shown (2 of 2)]
[im 1/63]
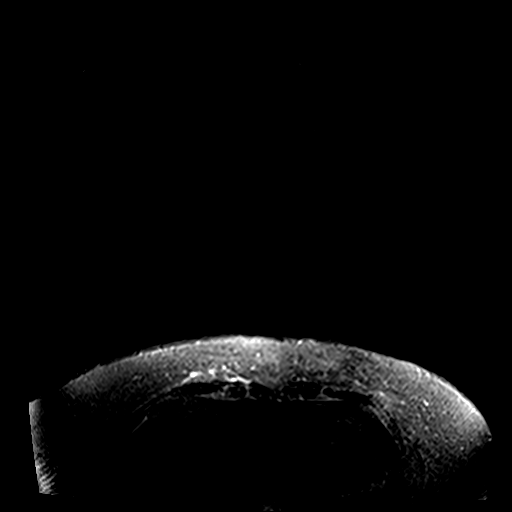

[Series 5: fl3d pre-cm · axial · non-contrast · 1.2mm · 1.09mm/px · z∈[-73,+117]mm · 2 of 160 slices shown (1 of 3)]
[im 1/160]
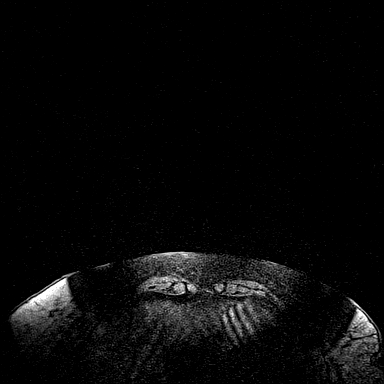
[im 160/160]
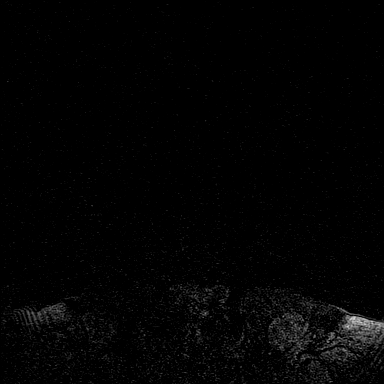

[Series 6: fl3d pre-cm · axial · non-contrast · 1.2mm · 1.09mm/px · z∈[-73,+117]mm · 2 of 160 slices shown (2 of 3)]
[im 1/160]
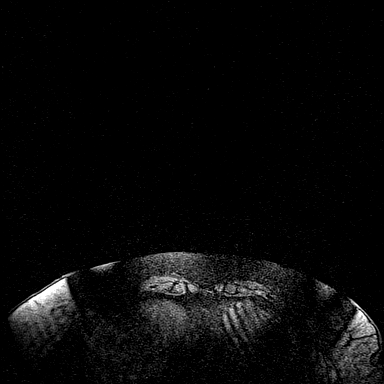
[im 160/160]
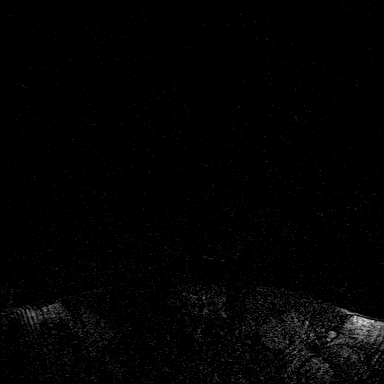

[Series 7: fl3d pre-cm_sub · axial · non-contrast · 1.2mm · 1.09mm/px · z∈[-73,+117]mm · 2 of 160 slices shown]
[im 1/160]
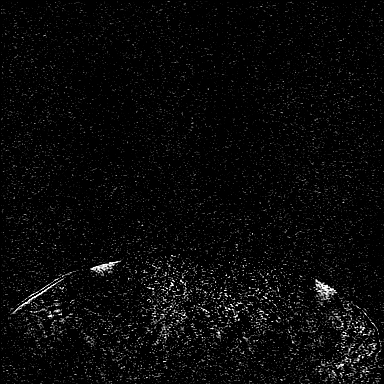
[im 160/160]
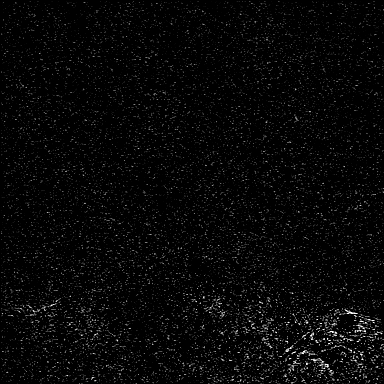

[Series 8: fl3d pre-cm_sub_mip_tra · axial · non-contrast · 192.0mm · 1.09mm/px · 1 of 1 slices shown]
[im 1/1]
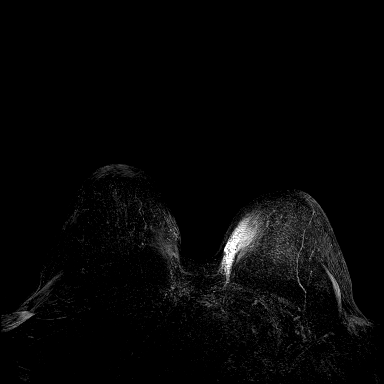

[Series 9: fl3d post-cm 20 · axial · 1.2mm · 1.09mm/px · z∈[-73,+117]mm · 2 of 160 slices shown (1 of 5)]
[im 1/160]
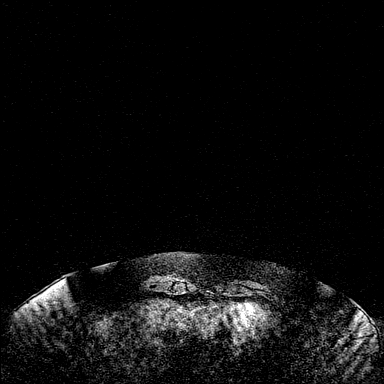
[im 160/160]
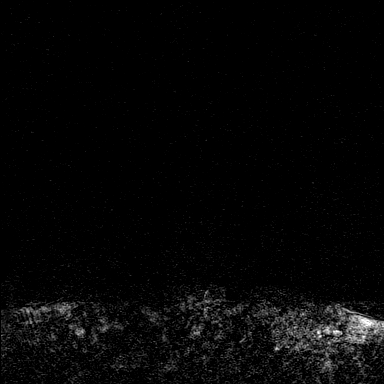

[Series 10: fl3d post-cm 20 · axial · 1.2mm · 1.09mm/px · z∈[-73,+117]mm · 2 of 160 slices shown (2 of 5)]
[im 1/160]
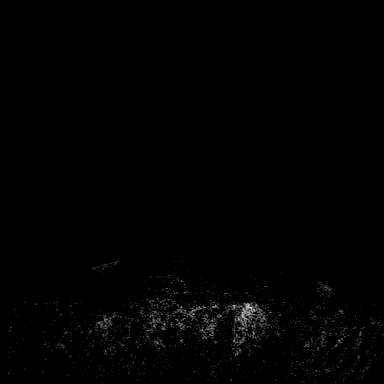
[im 160/160]
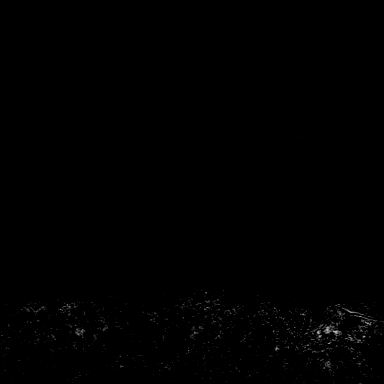

[Series 11: fl3d post-cm 20 · axial · 192.0mm · 1.09mm/px · 1 of 1 slices shown (3 of 5)]
[im 1/1]
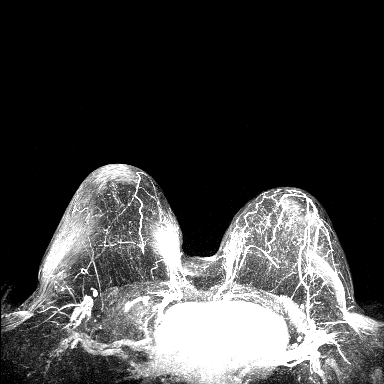

[Series 12: fl3d post-cm 3min · axial · 1.2mm · 1.09mm/px · z∈[-73,+117]mm · 2 of 160 slices shown]
[im 1/160]
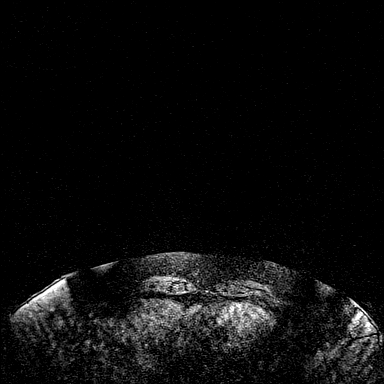
[im 160/160]
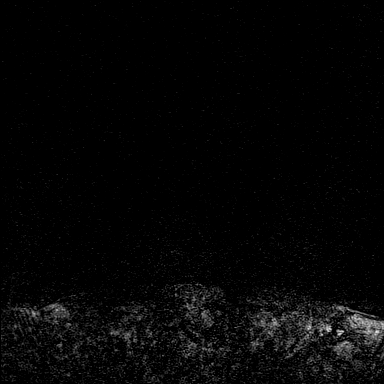

[Series 13: fl3d post-cm 3min_sub · axial · 1.2mm · 1.09mm/px · z∈[-73,+117]mm · 2 of 160 slices shown]
[im 1/160]
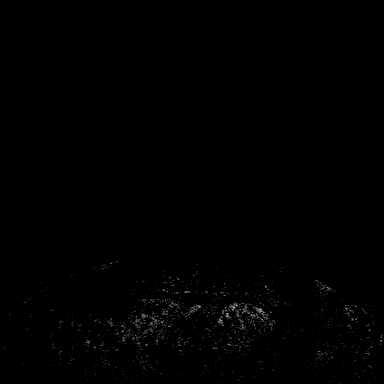
[im 160/160]
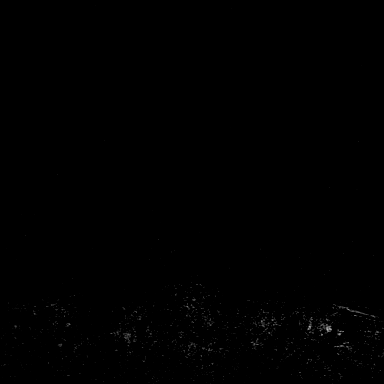

[Series 14: fl3d post-cm 3min_sub_mip_tra · axial · 192.0mm · 1.09mm/px · 1 of 1 slices shown]
[im 1/1]
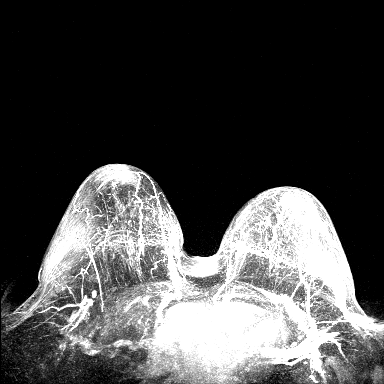

[Series 15: fl3d post-cm 5min · axial · 1.2mm · 1.09mm/px · z∈[-73,+117]mm · 2 of 160 slices shown]
[im 1/160]
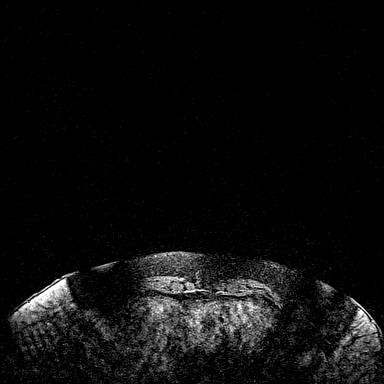
[im 160/160]
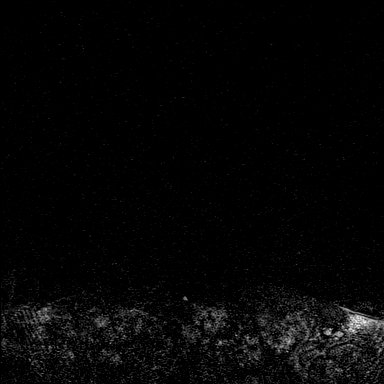

[Series 16: fl3d post-cm 5min_sub · axial · 1.2mm · 1.09mm/px · z∈[-73,+117]mm · 2 of 160 slices shown]
[im 1/160]
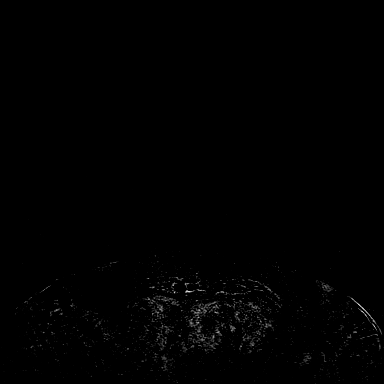
[im 160/160]
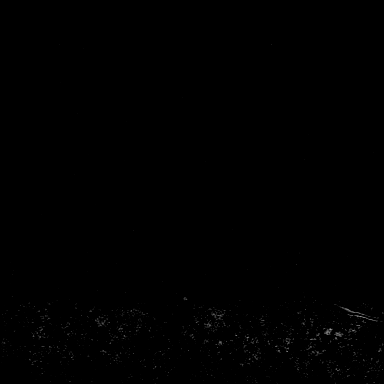

[Series 17: fl3d post-cm 5min_sub_mip_tra · axial · 192.0mm · 1.09mm/px · 1 of 1 slices shown]
[im 1/1]
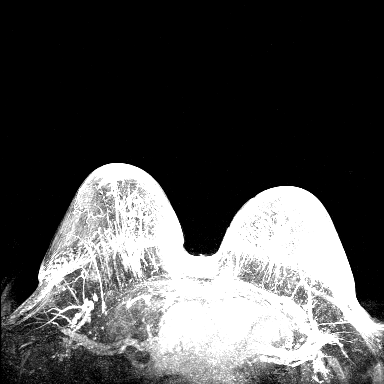

[Series 20: fl3d pre-cm · axial · non-contrast · 0.9mm · 0.87mm/px · z∈[-45,+127]mm · 2 of 192 slices shown (3 of 3)]
[im 1/192]
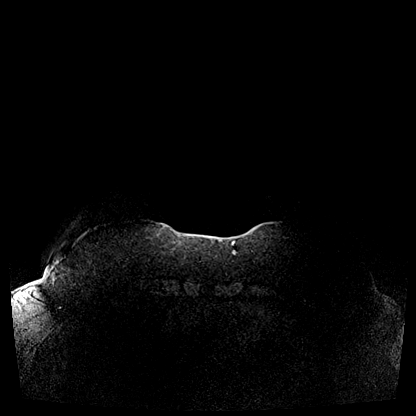
[im 192/192]
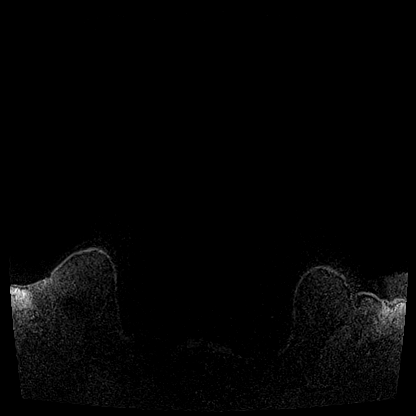

[Series 21: fl3d post-cm 20 · axial · 0.9mm · 0.87mm/px · z∈[-45,+127]mm · 2 of 192 slices shown (4 of 5)]
[im 1/192]
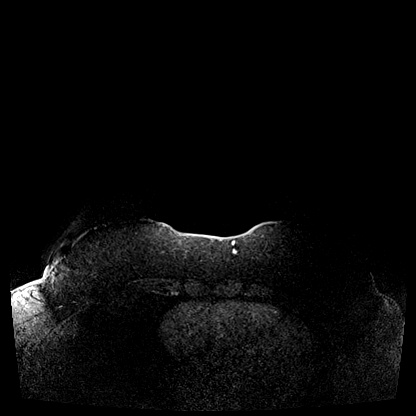
[im 192/192]
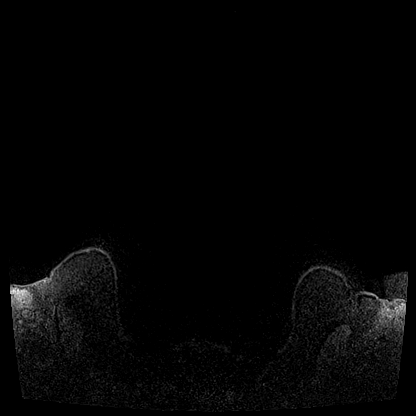

[Series 22: fl3d post-cm 20 · axial · 0.9mm · 0.87mm/px · 1 of 192 slices shown (5 of 5)]
[im 1/192]
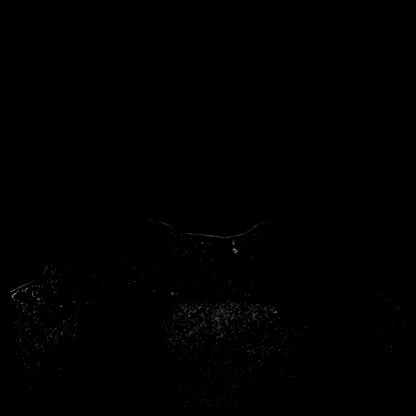

[31 of 48 positions shown; findings below may reference images not displayed]

Three-dimensional MR images were rendered by post-processing of the
original MR data on an independent workstation. The
three-dimensional MR images were interpreted, and findings are
reported in the following complete MRI report for this study. Three
dimensional images were evaluated at the independent interpreting
workstation using the DynaCAD thin client.

The patient's original imaging was performed on [DATE], but due
to technical issues with fat saturation on these original images,
the patient returned on [DATE] for repeat imaging, accounting
for the delay in this report.
FINDINGS: Breast composition: a. Almost entirely fat.

Background parenchymal enhancement: Moderate.

Right breast: Non-mass enhancement involving the LOWER OUTER
QUADRANT at ANTERIOR depth spanning approximately 9 x 8 x 3 mm,
demonstrating progressive enhancement kinetics, new since the prior
MRIs [DATE] and [DATE], without mammographic correlate.

Blooming artifact from the coil shaped tissue marker clip in the
INNER breast at the site of the biopsy-proven lobular neoplasia
(ALH). No abnormal enhancement in this location or elsewhere in the
breast.

Left breast: No suspicious mass or abnormal enhancement.

Lymph nodes: No pathologic lymphadenopathy.

Ancillary findings:  None.
IMPRESSION: 1. New indeterminate non-mass enhancement involving the LOWER OUTER
QUADRANT of the RIGHT breast at ANTERIOR depth spanning 9 mm.
2. No MRI evidence of malignancy involving the LEFT breast.
3. No pathologic lymphadenopathy.

RECOMMENDATION:
MRI guided biopsy of the new indeterminate NME involving the RIGHT
breast.

BI-RADS CATEGORY  4: Suspicious.

## 2020-08-29 MED ORDER — GADOBUTROL 1 MMOL/ML IV SOLN
10.0000 mL | Freq: Once | INTRAVENOUS | Status: AC | PRN
  Administered 2020-08-29: 10 mL via INTRAVENOUS

## 2020-08-31 ENCOUNTER — Other Ambulatory Visit (HOSPITAL_COMMUNITY): Payer: Self-pay | Admitting: Hematology

## 2020-08-31 DIAGNOSIS — C50911 Malignant neoplasm of unspecified site of right female breast: Secondary | ICD-10-CM

## 2020-09-05 ENCOUNTER — Inpatient Hospital Stay (HOSPITAL_COMMUNITY): Payer: Medicare Other | Attending: Hematology

## 2020-09-05 ENCOUNTER — Other Ambulatory Visit: Payer: Self-pay

## 2020-09-05 DIAGNOSIS — Z79811 Long term (current) use of aromatase inhibitors: Secondary | ICD-10-CM | POA: Diagnosis not present

## 2020-09-05 DIAGNOSIS — Z79899 Other long term (current) drug therapy: Secondary | ICD-10-CM | POA: Insufficient documentation

## 2020-09-05 DIAGNOSIS — Z170 Estrogen receptor positive status [ER+]: Secondary | ICD-10-CM | POA: Diagnosis not present

## 2020-09-05 DIAGNOSIS — M8580 Other specified disorders of bone density and structure, unspecified site: Secondary | ICD-10-CM | POA: Insufficient documentation

## 2020-09-05 DIAGNOSIS — N189 Chronic kidney disease, unspecified: Secondary | ICD-10-CM | POA: Insufficient documentation

## 2020-09-05 DIAGNOSIS — E875 Hyperkalemia: Secondary | ICD-10-CM | POA: Diagnosis not present

## 2020-09-05 DIAGNOSIS — C50911 Malignant neoplasm of unspecified site of right female breast: Secondary | ICD-10-CM | POA: Diagnosis not present

## 2020-09-05 LAB — VITAMIN D 25 HYDROXY (VIT D DEFICIENCY, FRACTURES): Vit D, 25-Hydroxy: 30.23 ng/mL (ref 30–100)

## 2020-09-05 LAB — COMPREHENSIVE METABOLIC PANEL
ALT: 14 U/L (ref 0–44)
AST: 15 U/L (ref 15–41)
Albumin: 4.1 g/dL (ref 3.5–5.0)
Alkaline Phosphatase: 54 U/L (ref 38–126)
Anion gap: 7 (ref 5–15)
BUN: 37 mg/dL — ABNORMAL HIGH (ref 8–23)
CO2: 24 mmol/L (ref 22–32)
Calcium: 10.1 mg/dL (ref 8.9–10.3)
Chloride: 104 mmol/L (ref 98–111)
Creatinine, Ser: 1.52 mg/dL — ABNORMAL HIGH (ref 0.44–1.00)
GFR, Estimated: 35 mL/min — ABNORMAL LOW (ref >60)
Glucose, Bld: 143 mg/dL — ABNORMAL HIGH (ref 70–99)
Potassium: 5.3 mmol/L — ABNORMAL HIGH (ref 3.5–5.1)
Sodium: 135 mmol/L (ref 135–145)
Total Bilirubin: 0.6 mg/dL (ref 0.3–1.2)
Total Protein: 7.2 g/dL (ref 6.5–8.1)

## 2020-09-05 LAB — CBC WITH DIFFERENTIAL/PLATELET
Abs Immature Granulocytes: 0.02 10*3/uL (ref 0.00–0.07)
Basophils Absolute: 0.1 10*3/uL (ref 0.0–0.1)
Basophils Relative: 1 %
Eosinophils Absolute: 0.6 10*3/uL — ABNORMAL HIGH (ref 0.0–0.5)
Eosinophils Relative: 9 %
HCT: 34.1 % — ABNORMAL LOW (ref 36.0–46.0)
Hemoglobin: 10.8 g/dL — ABNORMAL LOW (ref 12.0–15.0)
Immature Granulocytes: 0 %
Lymphocytes Relative: 16 %
Lymphs Abs: 1.1 10*3/uL (ref 0.7–4.0)
MCH: 29.5 pg (ref 26.0–34.0)
MCHC: 31.7 g/dL (ref 30.0–36.0)
MCV: 93.2 fL (ref 80.0–100.0)
Monocytes Absolute: 0.7 10*3/uL (ref 0.1–1.0)
Monocytes Relative: 10 %
Neutro Abs: 4.4 10*3/uL (ref 1.7–7.7)
Neutrophils Relative %: 64 %
Platelets: 323 10*3/uL (ref 150–400)
RBC: 3.66 MIL/uL — ABNORMAL LOW (ref 3.87–5.11)
RDW: 12.4 % (ref 11.5–15.5)
WBC: 6.9 10*3/uL (ref 4.0–10.5)
nRBC: 0 % (ref 0.0–0.2)

## 2020-09-06 ENCOUNTER — Ambulatory Visit
Admission: RE | Admit: 2020-09-06 | Discharge: 2020-09-06 | Disposition: A | Discharge status: Home / Self Care | Payer: Medicare Other | Source: Ambulatory Visit | Attending: Hematology | Admitting: Hematology

## 2020-09-06 DIAGNOSIS — C50911 Malignant neoplasm of unspecified site of right female breast: Secondary | ICD-10-CM

## 2020-09-12 ENCOUNTER — Other Ambulatory Visit: Payer: Self-pay

## 2020-09-12 ENCOUNTER — Encounter (HOSPITAL_COMMUNITY): Payer: Self-pay | Admitting: Hematology

## 2020-09-12 ENCOUNTER — Inpatient Hospital Stay (HOSPITAL_BASED_OUTPATIENT_CLINIC_OR_DEPARTMENT_OTHER): Payer: Medicare Other | Admitting: Hematology

## 2020-09-12 VITALS — BP 179/68 | HR 69 | Temp 98.5°F | Resp 18 | Wt 226.9 lb

## 2020-09-12 DIAGNOSIS — Z79811 Long term (current) use of aromatase inhibitors: Secondary | ICD-10-CM | POA: Diagnosis not present

## 2020-09-12 DIAGNOSIS — C50911 Malignant neoplasm of unspecified site of right female breast: Secondary | ICD-10-CM | POA: Diagnosis not present

## 2020-09-12 DIAGNOSIS — M8580 Other specified disorders of bone density and structure, unspecified site: Secondary | ICD-10-CM | POA: Diagnosis not present

## 2020-09-12 DIAGNOSIS — Z170 Estrogen receptor positive status [ER+]: Secondary | ICD-10-CM | POA: Diagnosis not present

## 2020-09-12 DIAGNOSIS — E875 Hyperkalemia: Secondary | ICD-10-CM | POA: Diagnosis not present

## 2020-09-12 DIAGNOSIS — R928 Other abnormal and inconclusive findings on diagnostic imaging of breast: Secondary | ICD-10-CM | POA: Diagnosis not present

## 2020-09-12 DIAGNOSIS — N189 Chronic kidney disease, unspecified: Secondary | ICD-10-CM | POA: Diagnosis not present

## 2020-09-12 NOTE — Progress Notes (Signed)
[facility] [address], [place] [address]   Patient Care Team: Celene Squibb, MD as PCP - General (Internal Medicine)  SUMMARY OF ONCOLOGIC HISTORY: Oncology History   No history exists.    CHIEF COMPLIANT: Follow-up for right breast cancer   INTERVAL HISTORY: Krista Payne is a 79 y.o. female here today for follow up of her right breast cancer. Her last visit was on 06/08/2020.   Today she reports feeling well. She is tolerating Arimidex well and denies having hot flashes or joint aches. She has leg swelling due to poor veins.   REVIEW OF SYSTEMS:   Review of Systems  Constitutional: Positive for appetite change (90%) and fatigue (75%).  Respiratory: Positive for cough (allergies).   Cardiovascular: Positive for leg swelling.  Endocrine: Negative for hot flashes.  Genitourinary: Positive for difficulty urinating and frequency.   Musculoskeletal: Negative for arthralgias.  All other systems reviewed and are negative.   I have reviewed the past medical history, past surgical history, social history and family history with the patient and they are unchanged from previous note.   ALLERGIES:   is allergic to bupropion hcl and sulfamethoxazole.   MEDICATIONS:  Current Outpatient Medications  Medication Sig Dispense Refill  . amLODipine (NORVASC) 5 MG tablet Take 5 mg by mouth daily.    Marland Kitchen anastrozole (ARIMIDEX) 1 MG tablet Take 1 tablet (1 mg total) by mouth daily. 90 tablet 3  . atenolol (TENORMIN) 25 MG tablet Take by mouth daily.    Marland Kitchen buPROPion (WELLBUTRIN XL) 150 MG 24 hr tablet Take 150 mg by mouth daily.    . Calcium-Vitamin D-Vitamin K (VIACTIV CALCIUM PLUS D) 650-12.5-40 MG-MCG-MCG CHEW Chew by mouth daily. Pt is taking 2 chewables daily    . levocetirizine (XYZAL) 5 MG tablet Take 5 mg by mouth every evening.    . Melatonin 5 MG SUBL Place 1 tablet under the tongue every evening. PRN    . Multiple Vitamins-Minerals (PRESERVISION AREDS  PO) Take 1 capsule by mouth 2 (two) times daily.    Marland Kitchen olmesartan (BENICAR) 40 MG tablet Take 40 mg by mouth daily.    . simvastatin (ZOCOR) 20 MG tablet Take 1 tablet (20 mg total) by mouth at bedtime. 90 tablet 3  . triamcinolone ointment (KENALOG) 0.5 % Apply topically 2 (two) times daily. Please give pt a 90 day supply 90 g 1   No current facility-administered medications for this visit.     PHYSICAL EXAMINATION: Performance status (ECOG): 1 - Symptomatic but completely ambulatory  Vitals:   09/12/20 1514  BP: (!) 179/68  Pulse: 69  Resp: 18  Temp: 98.5 F (36.9 C)  SpO2: 97%   Wt Readings from Last 3 Encounters:  09/12/20 226 lb 14.4 oz (102.9 kg)  06/08/20 229 lb 11.2 oz (104.2 kg)  02/24/20 229 lb 4.8 oz (104 kg)   Physical Exam Vitals reviewed.  Constitutional:      Appearance: Normal appearance. She is obese.  Cardiovascular:     Rate and Rhythm: Normal rate and regular rhythm.     Pulses: Normal pulses.     Heart sounds: Normal heart sounds.  Pulmonary:     Effort: Pulmonary effort is normal.     Breath sounds: Normal breath sounds.  Musculoskeletal:     Right lower leg: Edema (1+) present.     Left lower leg: Edema (1+) present.  Neurological:     General: No focal deficit present.  Mental Status: She is alert and oriented to person, place, and time.  Psychiatric:        Mood and Affect: Mood normal.        Behavior: Behavior normal.     Breast Exam Chaperone: Milinda Antis, MD     LABORATORY DATA:  I have reviewed the data as listed CMP Latest Ref Rng & Units 09/05/2020 06/01/2020 09/30/2019  Glucose 70 - 99 mg/dL 143(H) 140(H) 162(H)  BUN 8 - 23 mg/dL 37(H) 29(H) 34(H)  Creatinine 0.44 - 1.00 mg/dL 1.52(H) 1.35(H) 1.35(H)  Sodium 135 - 145 mmol/L 135 135 138  Potassium 3.5 - 5.1 mmol/L 5.3(H) 5.8(H) 5.1  Chloride 98 - 111 mmol/L 104 105 103  CO2 22 - 32 mmol/L $RemoveB'24 22 24  'amILzAvN$ Calcium 8.9 - 10.3 mg/dL 10.1 9.4 9.8  Total Protein 6.5 - 8.1 g/dL 7.2  7.4 7.3  Total Bilirubin 0.3 - 1.2 mg/dL 0.6 0.6 0.9  Alkaline Phos 38 - 126 U/L 54 56 59  AST 15 - 41 U/L 15 15 14(L)  ALT 0 - 44 U/L $Remo'14 14 13   'Zvvmu$ No results found for: [id number] Lab Results  Component Value Date   WBC 6.9 09/05/2020   HGB 10.8 (L) 09/05/2020   HCT 34.1 (L) 09/05/2020   MCV 93.2 09/05/2020   PLT 323 09/05/2020   NEUTROABS 4.4 09/05/2020   Lab Results  Component Value Date   VD25OH 30.23 09/05/2020   VD25OH 41 02/20/2011   VD25OH 34 04/18/2010    ASSESSMENT:  1. Right breast invasive lobular carcinoma: -Stereotactic biopsy on 06/18/2019 showed ILC in the right breast lower inner quadrant. 90% positive for ER/PR. Ki-67 was 5%. Tumor negative for HER-2. -She refused surgical intervention. -MRI of the breast on 08/24/2019 showed biopsy-proven right breast invasive lobular carcinoma and atypical lobular hyperplasia. No additional suspicious enhancements. -She was started on anastrozole on 08/24/2019. -MRI of the breast on 02/19/2020 showed treatment response with enhancement at the site of atypical lobular hyperplasia in the upper inner quadrant of the right breast measuring 0.9 x 0.5 cm (previously 1.3 x 0.9 cm).  Enhancement at the site of known invasive lobular carcinoma in the lower inner quadrant of the right breast has resolved completely.  Left breast remains negative.  No adenopathy. -MRI of the breast on 08/29/2020 shows non-mass enhancement involving the lower outer quadrant at anterior depth spanning 9 x 8 x 3 mm demonstrating progressive enhancement kinetics, new since prior MRI in April.  No mammographic correlate.  No abnormal enhancement at the clip biopsy site.  Left breast has no lesions.  2. Osteopenia: -DEXA scan on 07/20/2019 shows T score of -1.9.  3. CKD: -Creatinine is stable around 1.4.   PLAN:  1. Right breast invasive lobular carcinoma: -She is tolerating anastrozole very well.  No major side effects. -We reviewed results of the MRI of  the breast from 08/29/2020.  No evidence of malignancy involving the right lower inner quadrant.  New indeterminate nonmass enhancement involving the lower outer quadrant of the right breast at anterior depth spanning about 9 mm.  No abnormal lesions in the left breast or adenopathy. -We discussed biopsy versus watchful waiting.  Patient is reluctant to consider biopsy at this time. -I have recommended follow-up in 4 months with a repeat MRI of the right breast.  2. Osteopenia: -Continue calcium and vitamin D supplements. -Vitamin D level on the latest labs is normal.  3. CKD: -This is more or less stable around 1.5.  4.  Hyperkalemia: -Her potassium today is 5.3. -I have counseled her to avoid potassium rich foods.   Breast Cancer therapy associated bone loss: I have recommended calcium, Vitamin D and weight bearing exercises.   No orders of the defined types were placed in this encounter.  The patient has a good understanding of the overall plan. she agrees with it. she will call with any problems that may develop before the next visit here.    Derek Jack, MD [place] [phone]   I, Milinda Antis, am acting as a scribe for Dr. Sanda Linger.  I, Derek Jack MD, have reviewed the above documentation for accuracy and completeness, and I agree with the above.

## 2020-09-12 NOTE — Patient Instructions (Signed)
[place] at [facility] Discharge Instructions  You were seen today by Dr. Delton Coombes. He went over your recent results and scans. Do not eat foods with high potassium due to your mild kidney disease. You will be scheduled for an MRI scan of your right breast before your next visit. Dr. Delton Coombes will see you back in 4 months for labs and follow up.   Thank you for choosing [place] at [facility] to provide your oncology and hematology care.  To afford each patient quality time with our provider, please arrive at least 15 minutes before your scheduled appointment time.   If you have a lab appointment with the [facility] please come in thru the Main Entrance and check in at the main information desk  You need to re-schedule your appointment should you arrive 10 or more minutes late.  We strive to give you quality time with our providers, and arriving late affects you and other patients whose appointments are after yours.  Also, if you no show three or more times for appointments you may be dismissed from the clinic at the providers discretion.     Again, thank you for choosing [facility].  Our hope is that these requests will decrease the amount of time that you wait before being seen by our physicians.       _____________________________________________________________  Should you have questions after your visit to [facility], please contact our office at [phone] between the hours of 8:00 a.m. and 4:30 p.m.  Voicemails left after 4:00 p.m. will not be returned until the following business day.  For prescription refill requests, have your pharmacy contact our office and allow 72 hours.    Cancer Center Support Programs:   > Cancer Support Group  2nd Tuesday of the month 1pm-2pm, Journey Room

## 2020-09-13 ENCOUNTER — Other Ambulatory Visit: Payer: Self-pay | Admitting: Hematology

## 2020-09-28 DIAGNOSIS — H353231 Exudative age-related macular degeneration, bilateral, with active choroidal neovascularization: Secondary | ICD-10-CM | POA: Diagnosis not present

## 2020-10-29 DIAGNOSIS — Z853 Personal history of malignant neoplasm of breast: Secondary | ICD-10-CM | POA: Insufficient documentation

## 2020-12-08 ENCOUNTER — Encounter: Payer: Medicare HMO | Attending: Internal Medicine | Admitting: Nutrition

## 2020-12-08 ENCOUNTER — Other Ambulatory Visit: Payer: Self-pay

## 2020-12-08 DIAGNOSIS — E1165 Type 2 diabetes mellitus with hyperglycemia: Secondary | ICD-10-CM | POA: Insufficient documentation

## 2020-12-08 DIAGNOSIS — N183 Chronic kidney disease, stage 3 unspecified: Secondary | ICD-10-CM | POA: Diagnosis not present

## 2020-12-08 DIAGNOSIS — E118 Type 2 diabetes mellitus with unspecified complications: Secondary | ICD-10-CM | POA: Insufficient documentation

## 2020-12-08 DIAGNOSIS — E669 Obesity, unspecified: Secondary | ICD-10-CM | POA: Insufficient documentation

## 2020-12-08 NOTE — Progress Notes (Signed)
Medical Nutrition Therapy  This visit was completed via telephone due to the COVID-19 pandemic.   I spoke with Krista Payne  and verified that I was speaking with the correct person with two patient identifiers (full name and date of birth).   I discussed the limitations related to this kind of visit and the patient is willing to proceed.  Appointment Start time:  19 Appointment End time: 1400  Primary concerns today: Diabetes Referral diagnosis: E11.8  Preferred learning style:  no preference indicated Learning readiness:  change in progress   NUTRITION ASSESSMENT   Anthropometrics  Wt Readings from Last 3 Encounters:  09/12/20 226 lb 14.4 oz (102.9 kg)  06/08/20 229 lb 11.2 oz (104.2 kg)  02/24/20 229 lb 4.8 oz (104 kg)   Ht Readings from Last 3 Encounters:  07/16/19 5\' 3"  (1.6 m)  07/09/19 5\' 3"  (1.6 m)  02/07/15 5\' 3"  (1.6 m)   There is no height or weight on file to calculate BMI. @BMIFA @ Facility age limit for growth percentiles is 20 years. Facility age limit for growth percentiles is 20 years.   Clinical Medical Hx: HTN, Hyperlipidemia, DM Type 2, Vit D deficiency, obesity, osteoarthritis, CKD, Cancer in right breast  Medication Jardiance    Results for Krista Payne (MRN [id number]) as of 01/05/2021 16:06  Ref. Range 09/05/2020 12:26  COMPREHENSIVE METABOLIC PANEL Unknown Rpt (A)  Sodium Latest Ref Range: 135 - 145 mmol/L 135  Potassium Latest Ref Range: 3.5 - 5.1 mmol/L 5.3 (H)  Chloride Latest Ref Range: 98 - 111 mmol/L 104  CO2 Latest Ref Range: 22 - 32 mmol/L 24  Glucose Latest Ref Range: 70 - 99 mg/dL 143 (H)  BUN Latest Ref Range: 8 - 23 mg/dL 37 (H)  Creatinine Latest Ref Range: 0.44 - 1.00 mg/dL 1.52 (H)  Calcium Latest Ref Range: 8.9 - 10.3 mg/dL 10.1  Anion gap Latest Ref Range: 5 - 15  7  Alkaline Phosphatase Latest Ref Range: 38 - 126 U/L 54  Albumin Latest Ref Range: 3.5 - 5.0 g/dL 4.1  AST Latest Ref Range: 15 - 41 U/L 15  ALT Latest Ref  Range: 0 - 44 U/L 14  Total Protein Latest Ref Range: 6.5 - 8.1 g/dL 7.2  Total Bilirubin Latest Ref Range: 0.3 - 1.2 mg/dL 0.6  GFR, Estimated Latest Ref Range: >60 mL/min 35 (L)  Vitamin D, 25-Hydroxy Latest Ref Range: 30 - 100 ng/mL 30.23  WBC Latest Ref Range: 4.0 - 10.5 K/uL 6.9  RBC Latest Ref Range: 3.87 - 5.11 MIL/uL 3.66 (L)  Hemoglobin Latest Ref Range: 12.0 - 15.0 g/dL 10.8 (L)  HCT Latest Ref Range: 36.0 - 46.0 % 34.1 (L)  MCV Latest Ref Range: 80.0 - 100.0 fL 93.2  MCH Latest Ref Range: 26.0 - 34.0 pg 29.5  MCHC Latest Ref Range: 30.0 - 36.0 g/dL 31.7  RDW Latest Ref Range: 11.5 - 15.5 % 12.4  Platelets Latest Ref Range: 150 - 400 K/uL 323  nRBC Latest Ref Range: 0.0 - 0.2 % 0.0  Neutrophils Latest Units: % 64  Lymphocytes Latest Units: % 16  Monocytes Relative Latest Units: % 10  Eosinophil Latest Units: % 9  Basophil Latest Units: % 1  Immature Granulocytes Latest Units: % 0  NEUT# Latest Ref Range: 1.7 - 7.7 K/uL 4.4  Lymphocyte # Latest Ref Range: 0.7 - 4.0 K/uL 1.1  Monocyte # Latest Ref Range: 0.1 - 1.0 K/uL 0.7  Eosinophils Absolute Latest Ref Range: 0.0 -  0.5 K/uL 0.6 (H)  Basophils Absolute Latest Ref Range: 0.0 - 0.1 K/uL 0.1  Abs Immature Granulocytes Latest Ref Range: 0.00 - 0.07 K/uL 0.02  s:Jardiance. Labs.  Notable Signs/Symptoms: fatigue, thirst, frequent urination,  Her diet is poor and not appropriate for a diabetic that needs weight loss and has brest cancer. She needs a high plant based diet and avoiding processed and convenient foods. Diet is high in salt, potassium and fat which is contraindicated for her current medical issues.  Lifestyle & Dietary Hx Had treatment for r breast cancer. Has CKD. Sees Dr. Allyn Kenner PCP. Is on Jardiance. She doesn't coook much and eats out mostly.  Estimated daily fluid intake: 24-30oz Supplements:Vit D Sleep: varies  Stress / self-care: stress of cancer Current average weekly physical activity: ADL   24-Hr  Dietary Recall First Meal: birthday cake Snack: Second Meal: Fish fried, slaw, onion rings hushpuppies, water with lemon Snack:  Third Meal: sandwich-pimento cheese, water,  Snack: cereal lucky charms,milk  Beverages: water.  Estimated Energy Needs Calories: 1200 Carbohydrate: 135g Protein: 90g Fat: 33g   NUTRITION DIAGNOSIS  NB-1.1 Food and nutrition-related knowledge deficit As related to Diabetes Type 2.  As evidenced by A1C .   NUTRITION INTERVENTION  Nutrition education (E-1) on the following topics:   Nutrition and Diabetes education provided on My Plate, CHO counting, meal planning, portion sizes, timing of meals, avoiding snacks between meals unless having a low blood sugar, target ranges for A1C and blood sugars, signs/symptoms and treatment of hyper/hypoglycemia, monitoring blood sugars, taking medications as prescribed, benefits of exercising 30 minutes per day and prevention of complications of DM.  Healthy diet for cancer and weight loss  Handouts Provided Include   Emailed; My plate, diabetes instructions    Learning Style & Readiness for Change Teaching method utilized: Visual & Auditory  Demonstrated degree of understanding via: Teach Back  Barriers to learning/adherence to lifestyle change: none  Goals Established by Pt  Follow MY Plate  Increase fresh fruits and vegetables.  Cut out processed high salt high potassium foods  Drink only water  Walk 15 minutes twice a day  Don't skip meals  Avoid processed foods and concentrated foods.   MONITORING & EVALUATION Dietary intake, weekly physical activity, and  Blood sugars in 1 month.  Next Steps  Patient is to work on improving what she is eating..  Recommend to follow up with at kidney doctor for CKD. May choose another medication for treating DM since she is  At higher risk for fourneir's gangrene with obesity using [place].

## 2020-12-15 DIAGNOSIS — H43813 Vitreous degeneration, bilateral: Secondary | ICD-10-CM | POA: Diagnosis not present

## 2020-12-15 DIAGNOSIS — H353231 Exudative age-related macular degeneration, bilateral, with active choroidal neovascularization: Secondary | ICD-10-CM | POA: Diagnosis not present

## 2020-12-15 DIAGNOSIS — H3540 Unspecified peripheral retinal degeneration: Secondary | ICD-10-CM | POA: Diagnosis not present

## 2020-12-15 DIAGNOSIS — H35033 Hypertensive retinopathy, bilateral: Secondary | ICD-10-CM | POA: Diagnosis not present

## 2020-12-27 DIAGNOSIS — Z0100 Encounter for examination of eyes and vision without abnormal findings: Secondary | ICD-10-CM | POA: Diagnosis not present

## 2020-12-28 DIAGNOSIS — R69 Illness, unspecified: Secondary | ICD-10-CM | POA: Diagnosis not present

## 2020-12-30 ENCOUNTER — Ambulatory Visit
Admission: RE | Admit: 2020-12-30 | Discharge: 2020-12-30 | Disposition: A | Discharge status: Home / Self Care | Payer: Medicare HMO | Source: Ambulatory Visit | Attending: Hematology | Admitting: Hematology

## 2020-12-30 ENCOUNTER — Other Ambulatory Visit: Payer: Self-pay

## 2020-12-30 DIAGNOSIS — C50911 Malignant neoplasm of unspecified site of right female breast: Secondary | ICD-10-CM

## 2020-12-30 DIAGNOSIS — C50311 Malignant neoplasm of lower-inner quadrant of right female breast: Secondary | ICD-10-CM | POA: Diagnosis not present

## 2020-12-30 IMAGING — MR MR BREAST BILAT WO/W CM
8 of 12 series · 32 of 48 positions shown · IV contrast (gadavist)
Comparison: Bilateral breast MRI dated [DATE]. Bilateral
diagnostic mammogram dated [DATE].

CLINICAL DATA: Status post right stereotactic biopsy in [DATE]
demonstrating grade 2 invasive lobular carcinoma in the lower inner
quadrant the breast, marked with X shaped biopsy marker clip. She
also had biopsy of an asymmetry in the upper inner right breast
which demonstrated atypical lobular hyperplasia which was felt to be
discordant and recommended for excision. She was treated with
anastrozole. She refused surgery.

LABS:  None obtained on site today.
EXAM:
BILATERAL BREAST MRI WITH AND WITHOUT CONTRAST
TECHNIQUE: Multiplanar, multisequence MR images of both breasts were obtained
prior to and following the intravenous administration of 10 ml of
Gadavist

[Series 2: t2_tirm_tra ipat (a-p) · axial · 3.0mm · 0.82mm/px · 1 of 55 slices shown]
[im 1/55]
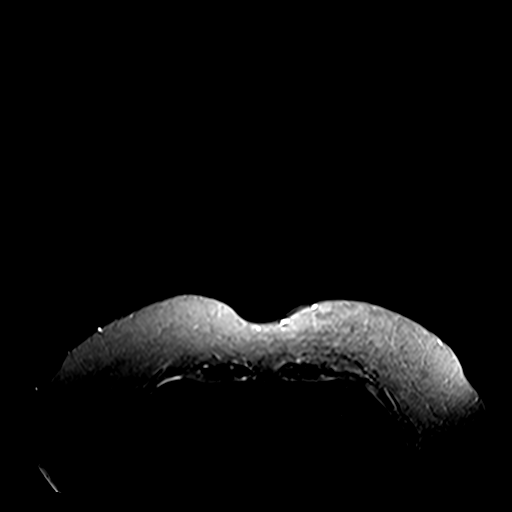

[Series 3: fl3d pre-cm no · axial · non-contrast · 1.2mm · 1.09mm/px · z∈[-51,+101]mm · 5 of 128 slices shown]
[im 1/128]
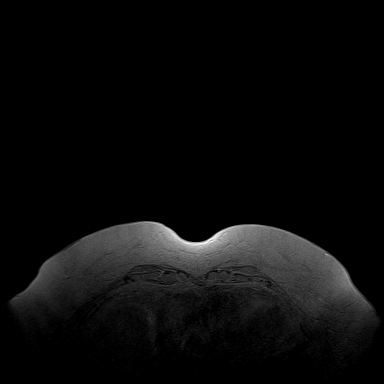
[im 32/128]
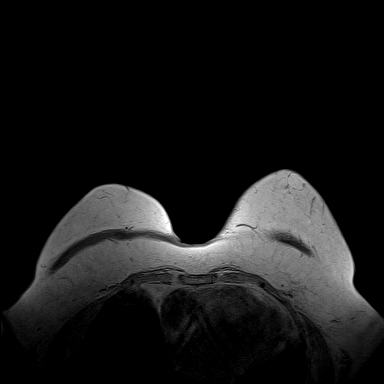
[im 64/128]
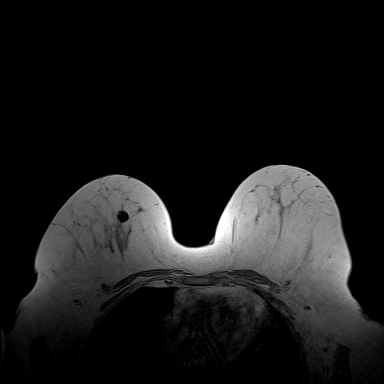
[im 96/128]
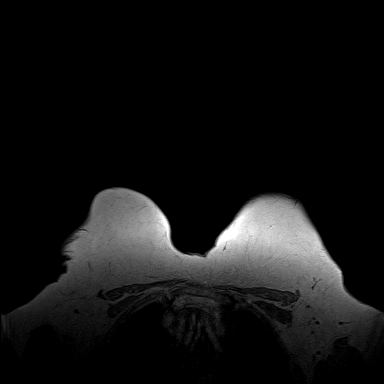
[im 128/128]
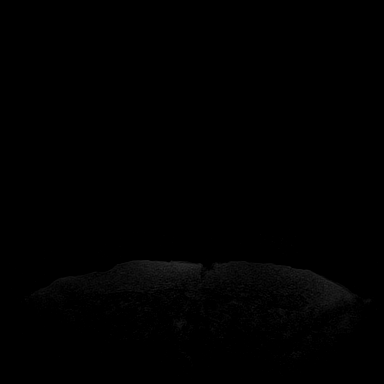

[Series 4: fl3d pre-cm · axial · non-contrast · 1.2mm · 1.09mm/px · z∈[-51,+101]mm · 5 of 128 slices shown]
[im 1/128]
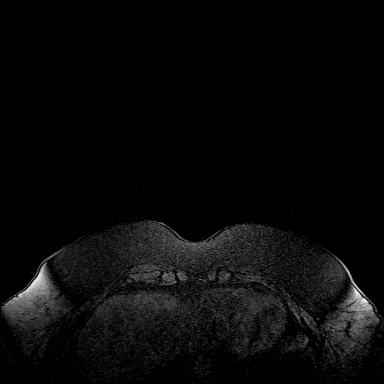
[im 32/128]
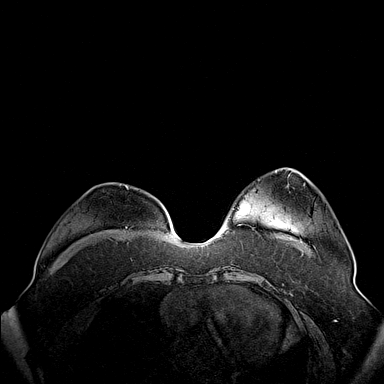
[im 64/128]
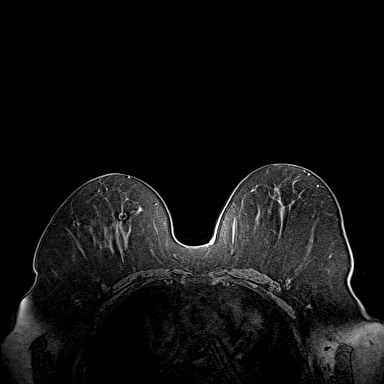
[im 96/128]
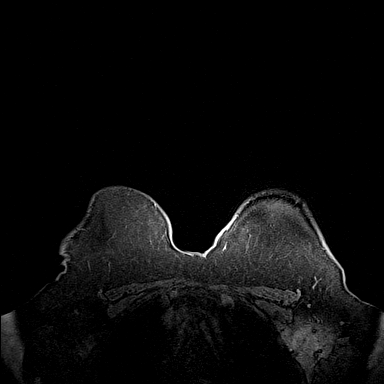
[im 128/128]
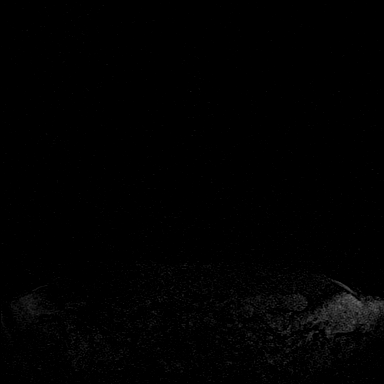

[Series 5: fl3d post-cm 20 · axial · 1.2mm · 1.09mm/px · z∈[-51,+101]mm · 5 of 128 slices shown (1 of 3)]
[im 1/128]
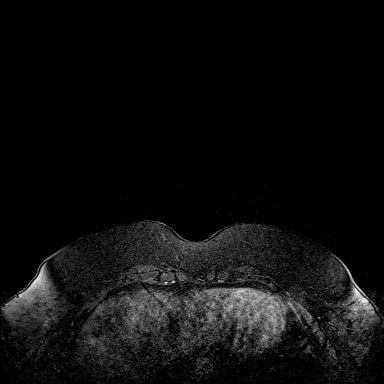
[im 32/128]
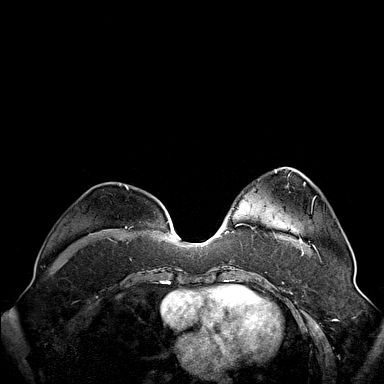
[im 64/128]
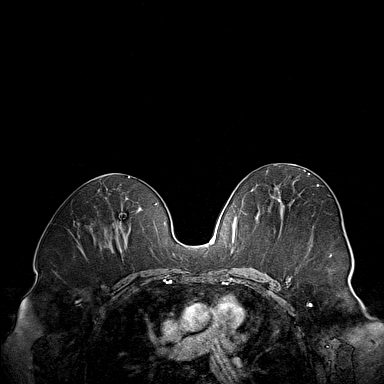
[im 96/128]
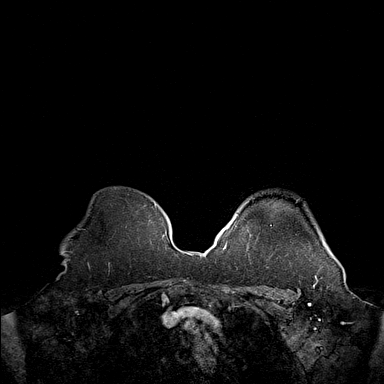
[im 128/128]
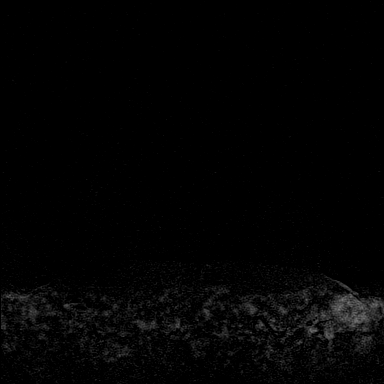

[Series 6: fl3d post-cm 20 · axial · 1.2mm · 1.09mm/px · z∈[-51,+101]mm · 5 of 128 slices shown (2 of 3)]
[im 1/128]
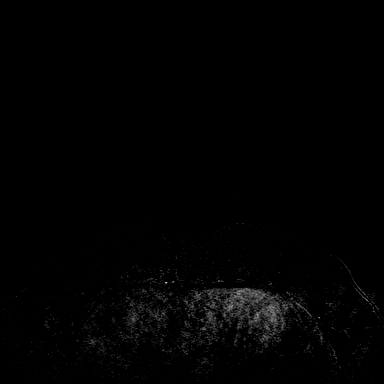
[im 32/128]
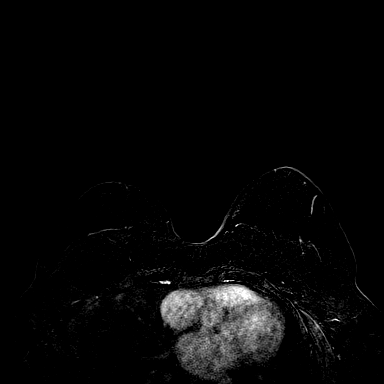
[im 64/128]
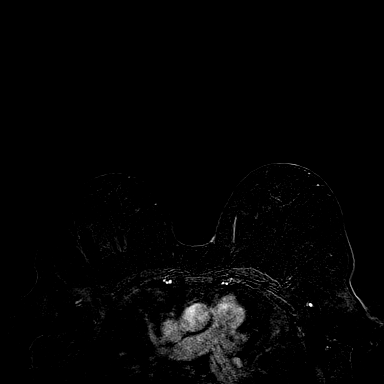
[im 96/128]
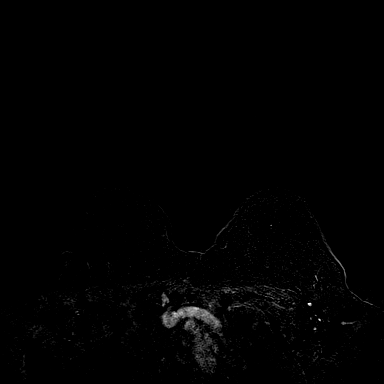
[im 128/128]
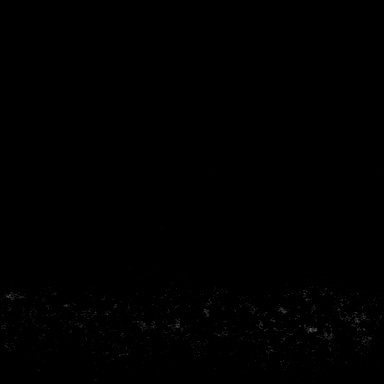

[Series 7: fl3d post-cm 20 · axial · 153.6mm · 1.09mm/px · 1 of 1 slices shown (3 of 3)]
[im 1/1]
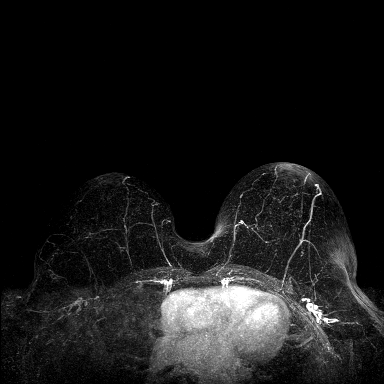

[Series 8: fl3d post-cm 3min · axial · 1.2mm · 1.09mm/px · z∈[-51,+101]mm · 6 of 128 slices shown]
[im 1/128]
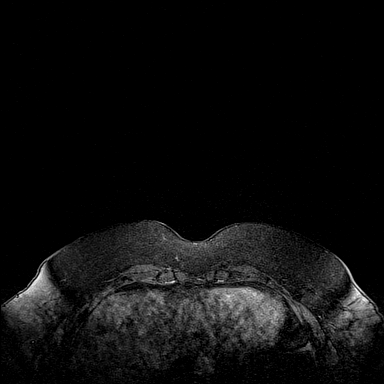
[im 26/128]
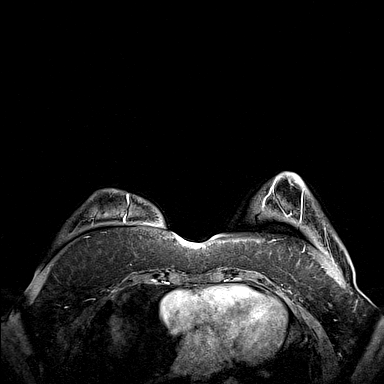
[im 51/128]
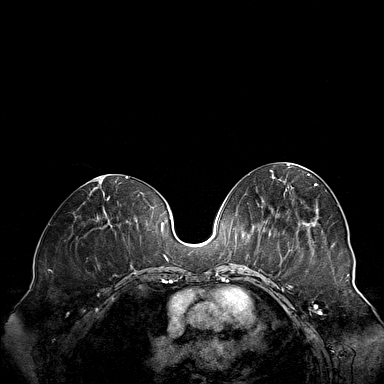
[im 77/128]
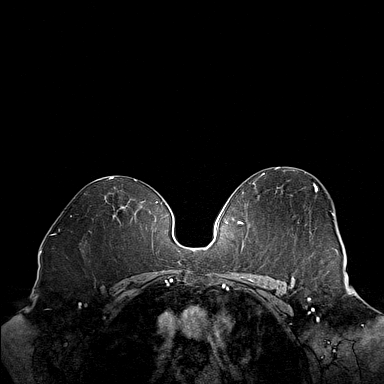
[im 102/128]
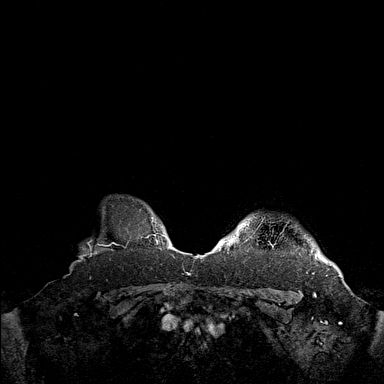
[im 128/128]
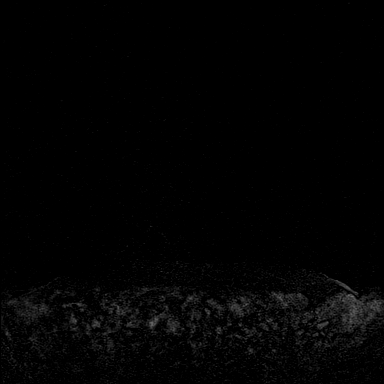

[Series 9: fl3d post-cm 3min_sub · axial · 1.2mm · 1.09mm/px · z∈[-51,+40]mm · 4 of 128 slices shown]
[im 1/128]
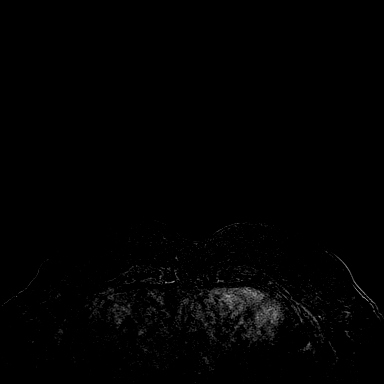
[im 26/128]
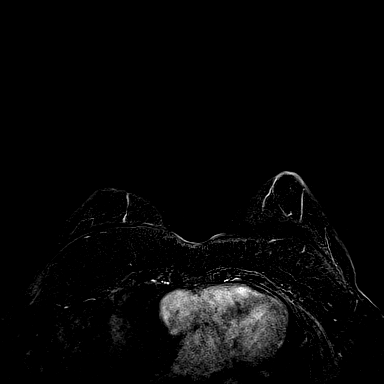
[im 51/128]
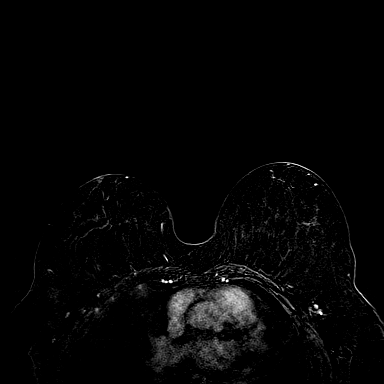
[im 77/128]
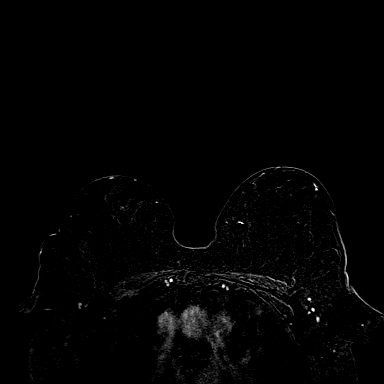

[32 of 48 positions shown; findings below may reference images not displayed]

Three-dimensional MR images were rendered by post-processing of the
original MR data on an independent workstation. The
three-dimensional MR images were interpreted, and findings are
reported in the following complete MRI report for this study. Three
dimensional images were evaluated at the independent interpreting
workstation using the DynaCAD thin client.
FINDINGS: Breast composition: a. Almost entirely fat.

Background parenchymal enhancement: Mild.

Right breast: The previously demonstrated non mass enhancement in
the lower outer quadrant of the right breast is no longer
demonstrated. Currently, no mass or enhancement suspicious for
malignancy are seen in the right breast.

Left breast: No mass or abnormal enhancement.

Lymph nodes: No abnormal appearing lymph nodes.

Ancillary findings:  None.
IMPRESSION: No findings suspicious for malignancy in either breast at this time.
The previously seen non mass enhancement in anterior lower outer
quadrant of the right breast is no longer visualized.

RECOMMENDATION:
1. Treatment plan.
2. Bilateral diagnostic mammogram 8 months when due.

BI-RADS CATEGORY  1: Negative.

## 2020-12-30 MED ORDER — GADOBUTROL 1 MMOL/ML IV SOLN
10.0000 mL | Freq: Once | INTRAVENOUS | Status: AC | PRN
  Administered 2020-12-30: 10 mL via INTRAVENOUS

## 2021-01-04 ENCOUNTER — Inpatient Hospital Stay (HOSPITAL_COMMUNITY): Payer: Medicare HMO | Attending: Hematology

## 2021-01-04 ENCOUNTER — Other Ambulatory Visit: Payer: Self-pay

## 2021-01-04 DIAGNOSIS — M8580 Other specified disorders of bone density and structure, unspecified site: Secondary | ICD-10-CM | POA: Diagnosis not present

## 2021-01-04 DIAGNOSIS — Z170 Estrogen receptor positive status [ER+]: Secondary | ICD-10-CM | POA: Diagnosis not present

## 2021-01-04 DIAGNOSIS — I83012 Varicose veins of right lower extremity with ulcer of calf: Secondary | ICD-10-CM | POA: Diagnosis not present

## 2021-01-04 DIAGNOSIS — E875 Hyperkalemia: Secondary | ICD-10-CM | POA: Insufficient documentation

## 2021-01-04 DIAGNOSIS — Z7984 Long term (current) use of oral hypoglycemic drugs: Secondary | ICD-10-CM | POA: Insufficient documentation

## 2021-01-04 DIAGNOSIS — Z79811 Long term (current) use of aromatase inhibitors: Secondary | ICD-10-CM | POA: Diagnosis not present

## 2021-01-04 DIAGNOSIS — E1122 Type 2 diabetes mellitus with diabetic chronic kidney disease: Secondary | ICD-10-CM | POA: Diagnosis not present

## 2021-01-04 DIAGNOSIS — R69 Illness, unspecified: Secondary | ICD-10-CM | POA: Diagnosis not present

## 2021-01-04 DIAGNOSIS — C50911 Malignant neoplasm of unspecified site of right female breast: Secondary | ICD-10-CM | POA: Insufficient documentation

## 2021-01-04 DIAGNOSIS — Z79899 Other long term (current) drug therapy: Secondary | ICD-10-CM | POA: Diagnosis not present

## 2021-01-04 DIAGNOSIS — N189 Chronic kidney disease, unspecified: Secondary | ICD-10-CM | POA: Insufficient documentation

## 2021-01-04 DIAGNOSIS — E782 Mixed hyperlipidemia: Secondary | ICD-10-CM | POA: Diagnosis not present

## 2021-01-04 DIAGNOSIS — J302 Other seasonal allergic rhinitis: Secondary | ICD-10-CM | POA: Diagnosis not present

## 2021-01-04 DIAGNOSIS — R600 Localized edema: Secondary | ICD-10-CM | POA: Diagnosis not present

## 2021-01-04 DIAGNOSIS — N1831 Chronic kidney disease, stage 3a: Secondary | ICD-10-CM | POA: Diagnosis not present

## 2021-01-04 LAB — CBC WITH DIFFERENTIAL/PLATELET
Abs Immature Granulocytes: 0.05 10*3/uL (ref 0.00–0.07)
Basophils Absolute: 0.1 10*3/uL (ref 0.0–0.1)
Basophils Relative: 1 %
Eosinophils Absolute: 0.5 10*3/uL (ref 0.0–0.5)
Eosinophils Relative: 5 %
HCT: 37 % (ref 36.0–46.0)
Hemoglobin: 11.4 g/dL — ABNORMAL LOW (ref 12.0–15.0)
Immature Granulocytes: 1 %
Lymphocytes Relative: 10 %
Lymphs Abs: 1 10*3/uL (ref 0.7–4.0)
MCH: 29.2 pg (ref 26.0–34.0)
MCHC: 30.8 g/dL (ref 30.0–36.0)
MCV: 94.9 fL (ref 80.0–100.0)
Monocytes Absolute: 0.8 10*3/uL (ref 0.1–1.0)
Monocytes Relative: 8 %
Neutro Abs: 7.8 10*3/uL — ABNORMAL HIGH (ref 1.7–7.7)
Neutrophils Relative %: 75 %
Platelets: 370 10*3/uL (ref 150–400)
RBC: 3.9 MIL/uL (ref 3.87–5.11)
RDW: 12.6 % (ref 11.5–15.5)
WBC: 10.2 10*3/uL (ref 4.0–10.5)
nRBC: 0 % (ref 0.0–0.2)

## 2021-01-04 LAB — COMPREHENSIVE METABOLIC PANEL
ALT: 13 U/L (ref 0–44)
AST: 13 U/L — ABNORMAL LOW (ref 15–41)
Albumin: 3.7 g/dL (ref 3.5–5.0)
Alkaline Phosphatase: 61 U/L (ref 38–126)
Anion gap: 9 (ref 5–15)
BUN: 53 mg/dL — ABNORMAL HIGH (ref 8–23)
CO2: 20 mmol/L — ABNORMAL LOW (ref 22–32)
Calcium: 9.6 mg/dL (ref 8.9–10.3)
Chloride: 105 mmol/L (ref 98–111)
Creatinine, Ser: 1.67 mg/dL — ABNORMAL HIGH (ref 0.44–1.00)
GFR, Estimated: 31 mL/min — ABNORMAL LOW (ref >60)
Glucose, Bld: 310 mg/dL — ABNORMAL HIGH (ref 70–99)
Potassium: 5.5 mmol/L — ABNORMAL HIGH (ref 3.5–5.1)
Sodium: 134 mmol/L — ABNORMAL LOW (ref 135–145)
Total Bilirubin: 0.6 mg/dL (ref 0.3–1.2)
Total Protein: 7.7 g/dL (ref 6.5–8.1)

## 2021-01-04 LAB — VITAMIN D 25 HYDROXY (VIT D DEFICIENCY, FRACTURES): Vit D, 25-Hydroxy: 31.29 ng/mL (ref 30–100)

## 2021-01-05 ENCOUNTER — Encounter: Payer: Self-pay | Admitting: Nutrition

## 2021-01-05 NOTE — Patient Instructions (Signed)
Goals Established by Pt . Follow MY Plate . Increase fresh fruits and vegetables. . Cut out processed high salt high potassium foods . Drink only water . Walk 15 minutes twice a day . Don't skip meals . Avoid processed foods and concentrated foods.

## 2021-01-09 ENCOUNTER — Encounter: Payer: Medicare HMO | Attending: Internal Medicine | Admitting: Nutrition

## 2021-01-09 ENCOUNTER — Other Ambulatory Visit: Payer: Self-pay

## 2021-01-09 VITALS — Ht 63.0 in | Wt 224.0 lb

## 2021-01-09 DIAGNOSIS — E782 Mixed hyperlipidemia: Secondary | ICD-10-CM | POA: Diagnosis not present

## 2021-01-09 DIAGNOSIS — E118 Type 2 diabetes mellitus with unspecified complications: Secondary | ICD-10-CM | POA: Diagnosis not present

## 2021-01-09 DIAGNOSIS — N183 Chronic kidney disease, stage 3 unspecified: Secondary | ICD-10-CM | POA: Diagnosis not present

## 2021-01-09 DIAGNOSIS — I10 Essential (primary) hypertension: Secondary | ICD-10-CM | POA: Insufficient documentation

## 2021-01-09 DIAGNOSIS — E1165 Type 2 diabetes mellitus with hyperglycemia: Secondary | ICD-10-CM | POA: Insufficient documentation

## 2021-01-09 DIAGNOSIS — E669 Obesity, unspecified: Secondary | ICD-10-CM | POA: Insufficient documentation

## 2021-01-09 NOTE — Progress Notes (Signed)
Medical Nutrition Therapy   Appointment Start time:  1400 Appointment End time: 1430 Primary concerns today: Diabetes Follow up visit Referral diagnosis: E11.8  Preferred learning style:  no preference indicated Learning readiness:  change in progress  Changes made:  Saw Dr. Nevada Crane last week and her Vania Rea was increased to 25 mg per day now. FBS:148 -215 mg/dl. Diet and lifestyle still have room for improvement. Trying to make better choices. Trying to eat out less often.  NUTRITION ASSESSMENT   Anthropometrics  Wt Readings from Last 3 Encounters:  09/12/20 226 lb 14.4 oz (102.9 kg)  06/08/20 229 lb 11.2 oz (104.2 kg)  02/24/20 229 lb 4.8 oz (104 kg)   Ht Readings from Last 3 Encounters:  07/16/19 5\' 3"  (1.6 m)  07/09/19 5\' 3"  (1.6 m)  02/07/15 5\' 3"  (1.6 m)   There is no height or weight on file to calculate BMI. @BMIFA @ Facility age limit for growth percentiles is 20 years. Facility age limit for growth percentiles is 20 years.   Clinical Medical Hx: HTN, Hyperlipidemia, DM Type 2, Vit D deficiency, obesity, osteoarthritis, CKD, Cancer in right breast  Medication Jardiance 25 mg a day, Metformin 500 mg a day,    Results for MARVINA, DANNER (MRN [id number]) as of 01/05/2021 16:06  Ref. Range 09/05/2020 12:26  COMPREHENSIVE METABOLIC PANEL Unknown Rpt (A)  Sodium Latest Ref Range: 135 - 145 mmol/L 135  Potassium Latest Ref Range: 3.5 - 5.1 mmol/L 5.3 (H)  Chloride Latest Ref Range: 98 - 111 mmol/L 104  CO2 Latest Ref Range: 22 - 32 mmol/L 24  Glucose Latest Ref Range: 70 - 99 mg/dL 143 (H)  BUN Latest Ref Range: 8 - 23 mg/dL 37 (H)  Creatinine Latest Ref Range: 0.44 - 1.00 mg/dL 1.52 (H)  Calcium Latest Ref Range: 8.9 - 10.3 mg/dL 10.1  Anion gap Latest Ref Range: 5 - 15  7  Alkaline Phosphatase Latest Ref Range: 38 - 126 U/L 54  Albumin Latest Ref Range: 3.5 - 5.0 g/dL 4.1  AST Latest Ref Range: 15 - 41 U/L 15  ALT Latest Ref Range: 0 - 44 U/L 14  Total Protein  Latest Ref Range: 6.5 - 8.1 g/dL 7.2  Total Bilirubin Latest Ref Range: 0.3 - 1.2 mg/dL 0.6  GFR, Estimated Latest Ref Range: >60 mL/min 35 (L)  Vitamin D, 25-Hydroxy Latest Ref Range: 30 - 100 ng/mL 30.23  WBC Latest Ref Range: 4.0 - 10.5 K/uL 6.9  RBC Latest Ref Range: 3.87 - 5.11 MIL/uL 3.66 (L)  Hemoglobin Latest Ref Range: 12.0 - 15.0 g/dL 10.8 (L)  HCT Latest Ref Range: 36.0 - 46.0 % 34.1 (L)  MCV Latest Ref Range: 80.0 - 100.0 fL 93.2  MCH Latest Ref Range: 26.0 - 34.0 pg 29.5  MCHC Latest Ref Range: 30.0 - 36.0 g/dL 31.7  RDW Latest Ref Range: 11.5 - 15.5 % 12.4  Platelets Latest Ref Range: 150 - 400 K/uL 323  nRBC Latest Ref Range: 0.0 - 0.2 % 0.0  Neutrophils Latest Units: % 64  Lymphocytes Latest Units: % 16  Monocytes Relative Latest Units: % 10  Eosinophil Latest Units: % 9  Basophil Latest Units: % 1  Immature Granulocytes Latest Units: % 0  NEUT# Latest Ref Range: 1.7 - 7.7 K/uL 4.4  Lymphocyte # Latest Ref Range: 0.7 - 4.0 K/uL 1.1  Monocyte # Latest Ref Range: 0.1 - 1.0 K/uL 0.7  Eosinophils Absolute Latest Ref Range: 0.0 - 0.5 K/uL 0.6 (H)  Basophils Absolute Latest Ref Range: 0.0 - 0.1 K/uL 0.1  Abs Immature Granulocytes Latest Ref Range: 0.00 - 0.07 K/uL 0.02  s:Jardiance. Labs.  Notable Signs/Symptoms: fatigue, thirst, frequent urination,  Her diet is poor and not appropriate for a diabetic that needs weight loss and has brest cancer. She needs a high plant based diet and avoiding processed and convenient foods. Diet is high in salt, potassium and fat which is contraindicated for her current medical issues.  Lifestyle & Dietary Hx Had treatment for r breast cancer. Has CKD. Sees Dr. Allyn Kenner PCP. Is on Jardiance. She doesn't cook much and eats out mostly.  Estimated daily fluid intake: 24-30oz Supplements:Vit D Sleep: varies  Stress / self-care: stress of cancer Current average weekly physical activity: ADL   24-Hr Dietary Recall First Meal: Breakfast  bar snack: Second Meal: MOW Tv dinner, Water Snack:  Third Meal: Bologna sandwich, water Snack:  Beverages: water.  Estimated Energy Needs Calories: 1200 Carbohydrate: 135g Protein: 90g Fat: 33g   NUTRITION DIAGNOSIS  NB-1.1 Food and nutrition-related knowledge deficit As related to Diabetes Type 2.  As evidenced by A1C    NUTRITION INTERVENTION  Nutrition education (E-1) on the following topics:  . Nutrition and Diabetes education provided on My Plate, CHO counting, meal planning, portion sizes, timing of meals, avoiding snacks between meals unless having a low blood sugar, target ranges for A1C and blood sugars, signs/symptoms and treatment of hyper/hypoglycemia, monitoring blood sugars, taking medications as prescribed, benefits of exercising 30 minutes per day and prevention of complications of DM. Marland Kitchen Healthy diet for cancer and weight loss  Handouts Provided Include    My plate, diabetes instructions  Learning Style & Readiness for Change Teaching method utilized: Visual & Auditory  Demonstrated degree of understanding via: Teach Back  Barriers to learning/adherence to lifestyle change: none  Goals Established by Pt . Follow MY Plate . Avoids high in potassium . Increase fresh fruits and vegetables. . Cut out processed high salt high potassium foods . Drink only water . Walk 15 minutes twice a day . Don't skip meals . Avoid processed foods and concentrated foods. Get FBS less than 150 and less than 180 at bedtime  MONITORING & EVALUATION Dietary intake, weekly physical activity, and  Blood sugars in 1 month.  Next Steps  Patient is to work on improving what she is eating..  Recommend to follow up with at kidney doctor for CKD. May choose another medication for treating DM since she is  At higher risk for fourneir's gangrene with obesity using Jardiance or any SGL-2 medications.  Recommend to consider adding Glipizide and a GLP for improved blood sugar control.

## 2021-01-11 ENCOUNTER — Inpatient Hospital Stay (HOSPITAL_BASED_OUTPATIENT_CLINIC_OR_DEPARTMENT_OTHER): Payer: Medicare HMO | Admitting: Hematology

## 2021-01-11 ENCOUNTER — Encounter (HOSPITAL_COMMUNITY): Payer: Self-pay | Admitting: Lab

## 2021-01-11 ENCOUNTER — Other Ambulatory Visit (HOSPITAL_COMMUNITY): Payer: Self-pay | Admitting: Hematology

## 2021-01-11 ENCOUNTER — Other Ambulatory Visit: Payer: Self-pay

## 2021-01-11 VITALS — BP 131/77 | HR 61 | Temp 98.5°F | Resp 18 | Wt 225.0 lb

## 2021-01-11 DIAGNOSIS — M8580 Other specified disorders of bone density and structure, unspecified site: Secondary | ICD-10-CM | POA: Diagnosis not present

## 2021-01-11 DIAGNOSIS — C50911 Malignant neoplasm of unspecified site of right female breast: Secondary | ICD-10-CM | POA: Diagnosis not present

## 2021-01-11 DIAGNOSIS — Z79811 Long term (current) use of aromatase inhibitors: Secondary | ICD-10-CM | POA: Diagnosis not present

## 2021-01-11 DIAGNOSIS — Z7984 Long term (current) use of oral hypoglycemic drugs: Secondary | ICD-10-CM | POA: Diagnosis not present

## 2021-01-11 DIAGNOSIS — Z170 Estrogen receptor positive status [ER+]: Secondary | ICD-10-CM | POA: Diagnosis not present

## 2021-01-11 DIAGNOSIS — N189 Chronic kidney disease, unspecified: Secondary | ICD-10-CM | POA: Diagnosis not present

## 2021-01-11 DIAGNOSIS — Z79899 Other long term (current) drug therapy: Secondary | ICD-10-CM | POA: Diagnosis not present

## 2021-01-11 DIAGNOSIS — E875 Hyperkalemia: Secondary | ICD-10-CM | POA: Diagnosis not present

## 2021-01-11 NOTE — Progress Notes (Unsigned)
Referral sent to Dr Theador Hawthorne.  Records faxed on 3/16

## 2021-01-11 NOTE — Patient Instructions (Addendum)
[place] at [facility] Discharge Instructions  You were seen today by Dr. Delton Coombes. He went over your recent results and scans. Your mammogram will be scheduled in November. You will be referred to Dr. Ulice Bold to help manage your kidney disease. Dr. Delton Coombes will see you back in 4 months for labs and follow up.   Thank you for choosing [place] at [facility] to provide your oncology and hematology care.  To afford each patient quality time with our provider, please arrive at least 15 minutes before your scheduled appointment time.   If you have a lab appointment with the [facility] please come in thru the Main Entrance and check in at the main information desk  You need to re-schedule your appointment should you arrive 10 or more minutes late.  We strive to give you quality time with our providers, and arriving late affects you and other patients whose appointments are after yours.  Also, if you no show three or more times for appointments you may be dismissed from the clinic at the providers discretion.     Again, thank you for choosing [facility].  Our hope is that these requests will decrease the amount of time that you wait before being seen by our physicians.       _____________________________________________________________  Should you have questions after your visit to [facility], please contact our office at [phone] between the hours of 8:00 a.m. and 4:30 p.m.  Voicemails left after 4:00 p.m. will not be returned until the following business day.  For prescription refill requests, have your pharmacy contact our office and allow 72 hours.    Cancer Center Support Programs:   > Cancer Support Group  2nd Tuesday of the month 1pm-2pm, Journey Room

## 2021-01-11 NOTE — Progress Notes (Signed)
[facility] [address] [place] [address]   Patient Care Team: Celene Squibb, MD as PCP - General (Internal Medicine)  SUMMARY OF ONCOLOGIC HISTORY: Oncology History   No history exists.    CHIEF COMPLIANT: Follow-up for right breast cancer   INTERVAL HISTORY: [name] is a 80 y.o. female here today for follow up of her right breast cancer. Her last visit was on 09/12/2020.   Today she reports feeling well. She went to a dietician and received a list of foods high in potassium and she is trying to avoid those foods. She is taking Arimidex and tolerating it well.    REVIEW OF SYSTEMS:   Review of Systems  Constitutional: Positive for fatigue (75%). Negative for appetite change.  All other systems reviewed and are negative.   I have reviewed the past medical history, past surgical history, social history and family history with the patient and they are unchanged from previous note.   ALLERGIES:   is allergic to bupropion hcl and sulfamethoxazole.   MEDICATIONS:  Current Outpatient Medications  Medication Sig Dispense Refill  . albuterol (VENTOLIN HFA) 108 (90 Base) MCG/ACT inhaler     . amLODipine (NORVASC) 5 MG tablet Take 5 mg by mouth daily.    Marland Kitchen anastrozole (ARIMIDEX) 1 MG tablet Take 1 tablet (1 mg total) by mouth daily. 90 tablet 3  . atenolol (TENORMIN) 25 MG tablet Take by mouth daily.    . Blood Glucose Monitoring Suppl (FREESTYLE LITE) w/Device KIT USE TO CHECK BLOOD SUGARTONCE DAILY AS DIRECTED    . buPROPion (WELLBUTRIN XL) 150 MG 24 hr tablet Take 150 mg by mouth daily.    . Calcium-Vitamin D-Vitamin K (VIACTIV CALCIUM PLUS D) 650-12.5-40 MG-MCG-MCG CHEW Chew by mouth daily. Pt is taking 2 chewables daily    . empagliflozin (JARDIANCE) 10 MG TABS tablet Take 10 mg by mouth daily.    Marland Kitchen FREESTYLE LITE test strip daily.    . Lancets (FREESTYLE) lancets daily.    Marland Kitchen levocetirizine (XYZAL) 5 MG tablet Take 5 mg by mouth every  evening.    . Melatonin 5 MG SUBL Place 1 tablet under the tongue every evening. PRN    . metFORMIN (GLUCOPHAGE) 500 MG tablet Take by mouth.    . Multiple Vitamins-Minerals (PRESERVISION AREDS PO) Take 1 capsule by mouth 2 (two) times daily.    Marland Kitchen olmesartan (BENICAR) 40 MG tablet Take 40 mg by mouth daily.    . predniSONE (DELTASONE) 20 MG tablet     . simvastatin (ZOCOR) 20 MG tablet Take 1 tablet (20 mg total) by mouth at bedtime. 90 tablet 3  . sodium polystyrene (KAYEXALATE) 15 GM/60ML suspension     . SYNJARDY XR 25-1000 MG TB24 Take 1 tablet by mouth daily.    Marland Kitchen triamcinolone ointment (KENALOG) 0.5 % Apply topically 2 (two) times daily. Please give pt a 90 day supply 90 g 1   No current facility-administered medications for this visit.     PHYSICAL EXAMINATION: Performance status (ECOG): 1 - Symptomatic but completely ambulatory  Vitals:   01/11/21 1510  BP: 131/77  Pulse: 61  Resp: 18  Temp: 98.5 F (36.9 C)  SpO2: 93%   Wt Readings from Last 3 Encounters:  01/11/21 225 lb (102.1 kg)  01/09/21 224 lb (101.6 kg)  09/12/20 226 lb 14.4 oz (102.9 kg)   Physical Exam Vitals reviewed.  Constitutional:      Appearance: Normal appearance.  She is obese.  Cardiovascular:     Rate and Rhythm: Normal rate and regular rhythm.     Pulses: Normal pulses.     Heart sounds: Normal heart sounds.  Pulmonary:     Effort: Pulmonary effort is normal.     Breath sounds: Normal breath sounds.  Chest:  Breasts:     Right: No swelling, bleeding, inverted nipple, mass, nipple discharge, skin change, tenderness, axillary adenopathy or supraclavicular adenopathy.     Left: No swelling, bleeding, inverted nipple, mass, nipple discharge, skin change, tenderness, axillary adenopathy or supraclavicular adenopathy.    Musculoskeletal:     Right lower leg: Edema present.     Left lower leg: Edema present.  Lymphadenopathy:     Cervical: No cervical adenopathy.     Upper Body:     Right  upper body: No supraclavicular, axillary or pectoral adenopathy.     Left upper body: No supraclavicular, axillary or pectoral adenopathy.  Skin:    Comments: Bandage above right ankle  Neurological:     General: No focal deficit present.     Mental Status: She is alert and oriented to person, place, and time.  Psychiatric:        Mood and Affect: Mood normal.        Behavior: Behavior normal.     Breast Exam Chaperone: Milinda Antis, MD     LABORATORY DATA:  I have reviewed the data as listed CMP Latest Ref Rng & Units 01/04/2021 09/05/2020 06/01/2020  Glucose 70 - 99 mg/dL 310(H) 143(H) 140(H)  BUN 8 - 23 mg/dL 53(H) 37(H) 29(H)  Creatinine 0.44 - 1.00 mg/dL 1.67(H) 1.52(H) 1.35(H)  Sodium 135 - 145 mmol/L 134(L) 135 135  Potassium 3.5 - 5.1 mmol/L 5.5(H) 5.3(H) 5.8(H)  Chloride 98 - 111 mmol/L 105 104 105  CO2 22 - 32 mmol/L 20(L) 24 22  Calcium 8.9 - 10.3 mg/dL 9.6 10.1 9.4  Total Protein 6.5 - 8.1 g/dL 7.7 7.2 7.4  Total Bilirubin 0.3 - 1.2 mg/dL 0.6 0.6 0.6  Alkaline Phos 38 - 126 U/L 61 54 56  AST 15 - 41 U/L 13(L) 15 15  ALT 0 - 44 U/L $Remo'13 14 14   'GgaJm$ No results found for: [id number] Lab Results  Component Value Date   WBC 10.2 01/04/2021   HGB 11.4 (L) 01/04/2021   HCT 37.0 01/04/2021   MCV 94.9 01/04/2021   PLT 370 01/04/2021   NEUTROABS 7.8 (H) 01/04/2021   Lab Results  Component Value Date   VD25OH 31.29 01/04/2021   VD25OH 30.23 09/05/2020   VD25OH 41 02/20/2011    ASSESSMENT:  1. Right breast invasive lobular carcinoma: -Stereotactic biopsy on 06/18/2019 showed ILC in the right breast lower inner quadrant. 90% positive for ER/PR. Ki-67 was 5%. Tumor negative for HER-2. -She refused surgical intervention. -MRI of the breast on 08/24/2019 showed biopsy-proven right breast invasive lobular carcinoma and atypical lobular hyperplasia. No additional suspicious enhancements. -She was started on anastrozole on 08/24/2019. -MRI of the breast on 02/19/2020 showed  treatment response with enhancement at the site of atypical lobular hyperplasia in the upper inner quadrant of the right breast measuring 0.9 x 0.5 cm (previously 1.3 x 0.9 cm). Enhancement at the site of known invasive lobular carcinoma in the lower inner quadrant of the right breast has resolved completely. Left breast remains negative. No adenopathy. -MRI of the breast on 08/29/2020 shows non-mass enhancement involving the lower outer quadrant at anterior depth spanning 9 x 8 x  3 mm demonstrating progressive enhancement kinetics, new since prior MRI in April.  No mammographic correlate.  No abnormal enhancement at the clip biopsy site.  Left breast has no lesions.  2. Osteopenia: -DEXA scan on 07/20/2019 shows T score of -1.9.  3. CKD: -Creatinine is stable around 1.4.   PLAN:  1. Right breast invasive lobular carcinoma: -She is tolerating anastrozole very well without any side effects. -Physical examination today did not reveal any palpable masses. -Reviewed MRI of the breast from 12/30/2020 which shows no suspicious findings for malignancy in either breast.  BI-RADS Category 1. -She will have a bilateral diagnostic mammogram in 8 months.  RTC 4 months with labs and physical exam.  2. Osteopenia: -Vitamin D level is 31.  Continue calcium and vitamin D supplements.  3. CKD: -Creatinine today is 1.67.  We will make a referral to Dr. Theador Hawthorne.  4. Hyperkalemia: -Her potassium today is 5.5 and has been high for the last 3 times. -Counseled to avoid potassium rich foods.   Breast Cancer therapy associated bone loss: I have recommended calcium, Vitamin D and weight bearing exercises.   No orders of the defined types were placed in this encounter.  The patient has a good understanding of the overall plan. she agrees with it. she will call with any problems that may develop before the next visit here.    Derek Jack, MD [place] [phone]   I,  Milinda Antis, am acting as a scribe for Dr. Sanda Linger.  I, Derek Jack MD, have reviewed the above documentation for accuracy and completeness, and I agree with the above.

## 2021-01-13 DIAGNOSIS — R69 Illness, unspecified: Secondary | ICD-10-CM | POA: Diagnosis not present

## 2021-01-26 DIAGNOSIS — Z6839 Body mass index (BMI) 39.0-39.9, adult: Secondary | ICD-10-CM | POA: Diagnosis not present

## 2021-01-26 DIAGNOSIS — R69 Illness, unspecified: Secondary | ICD-10-CM | POA: Diagnosis not present

## 2021-01-26 DIAGNOSIS — E1165 Type 2 diabetes mellitus with hyperglycemia: Secondary | ICD-10-CM | POA: Diagnosis not present

## 2021-01-26 DIAGNOSIS — E785 Hyperlipidemia, unspecified: Secondary | ICD-10-CM | POA: Diagnosis not present

## 2021-01-26 DIAGNOSIS — I10 Essential (primary) hypertension: Secondary | ICD-10-CM | POA: Diagnosis not present

## 2021-01-26 DIAGNOSIS — C50919 Malignant neoplasm of unspecified site of unspecified female breast: Secondary | ICD-10-CM | POA: Diagnosis not present

## 2021-01-26 DIAGNOSIS — H3530 Unspecified macular degeneration: Secondary | ICD-10-CM | POA: Diagnosis not present

## 2021-01-26 DIAGNOSIS — J309 Allergic rhinitis, unspecified: Secondary | ICD-10-CM | POA: Diagnosis not present

## 2021-01-26 DIAGNOSIS — Z79811 Long term (current) use of aromatase inhibitors: Secondary | ICD-10-CM | POA: Diagnosis not present

## 2021-01-30 ENCOUNTER — Encounter: Payer: Self-pay | Admitting: Nutrition

## 2021-01-30 NOTE — Patient Instructions (Signed)
  Goals Established by Pt . Follow MY Plate . Increase fresh fruits and vegetables. . Cut out processed high salt high potassium foods . Drink only water . Walk 15 minutes twice a day . Don't skip meals . Avoid processed foods and concentrated foods. . Get FBS less than 150 and less than 180 at bedtime

## 2021-02-01 ENCOUNTER — Other Ambulatory Visit (HOSPITAL_COMMUNITY): Payer: Self-pay | Admitting: Nephrology

## 2021-02-01 ENCOUNTER — Other Ambulatory Visit: Payer: Self-pay | Admitting: Nephrology

## 2021-02-01 DIAGNOSIS — E875 Hyperkalemia: Secondary | ICD-10-CM

## 2021-02-01 DIAGNOSIS — E1122 Type 2 diabetes mellitus with diabetic chronic kidney disease: Secondary | ICD-10-CM | POA: Diagnosis not present

## 2021-02-01 DIAGNOSIS — N189 Chronic kidney disease, unspecified: Secondary | ICD-10-CM | POA: Diagnosis not present

## 2021-02-01 DIAGNOSIS — I129 Hypertensive chronic kidney disease with stage 1 through stage 4 chronic kidney disease, or unspecified chronic kidney disease: Secondary | ICD-10-CM

## 2021-02-01 DIAGNOSIS — E871 Hypo-osmolality and hyponatremia: Secondary | ICD-10-CM

## 2021-02-01 DIAGNOSIS — D638 Anemia in other chronic diseases classified elsewhere: Secondary | ICD-10-CM | POA: Diagnosis not present

## 2021-02-01 DIAGNOSIS — Z79899 Other long term (current) drug therapy: Secondary | ICD-10-CM | POA: Diagnosis not present

## 2021-02-04 ENCOUNTER — Other Ambulatory Visit: Payer: Medicare Other

## 2021-02-08 ENCOUNTER — Ambulatory Visit (HOSPITAL_COMMUNITY)
Admission: RE | Admit: 2021-02-08 | Discharge: 2021-02-08 | Disposition: A | Discharge status: Home / Self Care | Payer: Medicare HMO | Source: Ambulatory Visit | Attending: Nephrology | Admitting: Nephrology

## 2021-02-08 DIAGNOSIS — I129 Hypertensive chronic kidney disease with stage 1 through stage 4 chronic kidney disease, or unspecified chronic kidney disease: Secondary | ICD-10-CM | POA: Insufficient documentation

## 2021-02-08 DIAGNOSIS — Z0389 Encounter for observation for other suspected diseases and conditions ruled out: Secondary | ICD-10-CM | POA: Diagnosis not present

## 2021-02-08 DIAGNOSIS — D638 Anemia in other chronic diseases classified elsewhere: Secondary | ICD-10-CM | POA: Insufficient documentation

## 2021-02-08 DIAGNOSIS — E871 Hypo-osmolality and hyponatremia: Secondary | ICD-10-CM | POA: Insufficient documentation

## 2021-02-08 DIAGNOSIS — E875 Hyperkalemia: Secondary | ICD-10-CM | POA: Insufficient documentation

## 2021-02-08 DIAGNOSIS — E1122 Type 2 diabetes mellitus with diabetic chronic kidney disease: Secondary | ICD-10-CM | POA: Insufficient documentation

## 2021-02-08 IMAGING — US US RENAL
1 series · 14 of 25 positions shown · non-contrast
Comparison: None.

CLINICAL DATA: Chronic kidney disease due to type 2 diabetes

EXAM:
RENAL / URINARY TRACT ULTRASOUND COMPLETE

[Series 1: us renal · 0.25mm/px · 14 of 39 slices shown]
[im 1/39]
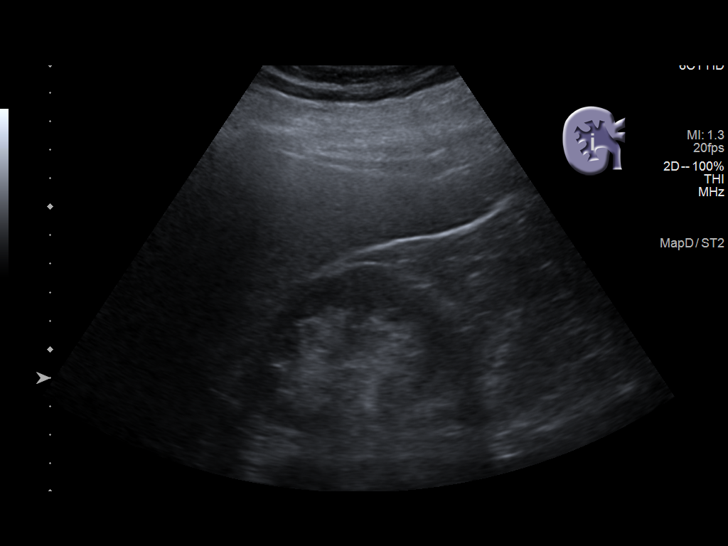
[im 4/39]
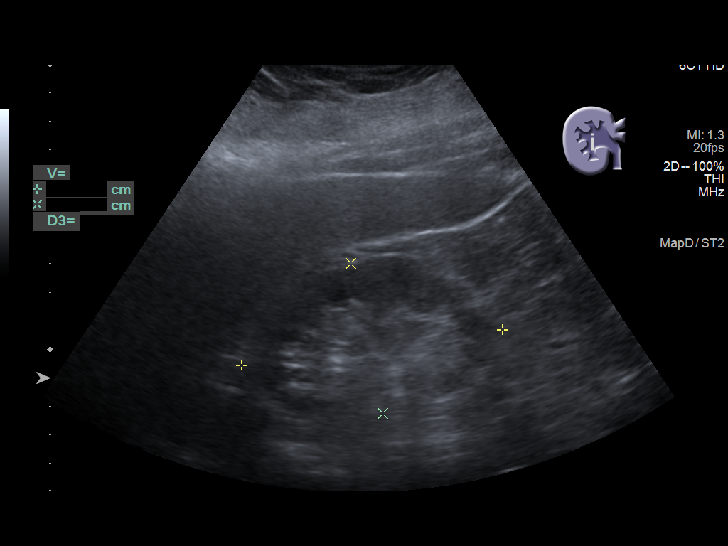
[im 7/39]
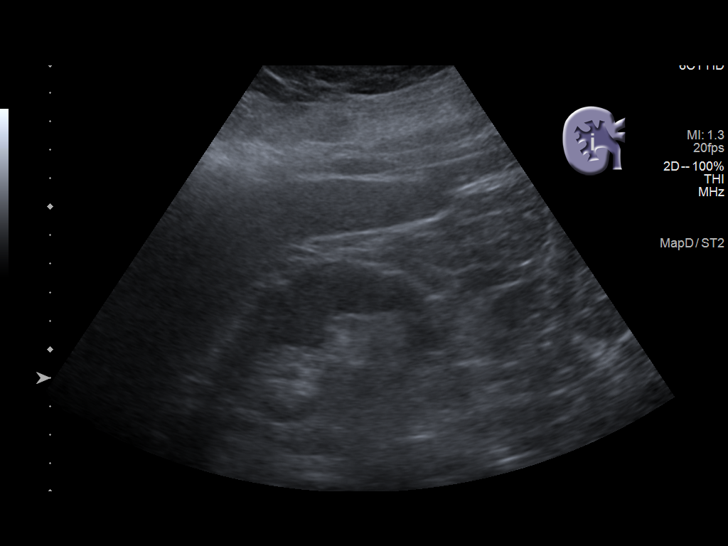
[im 10/39]
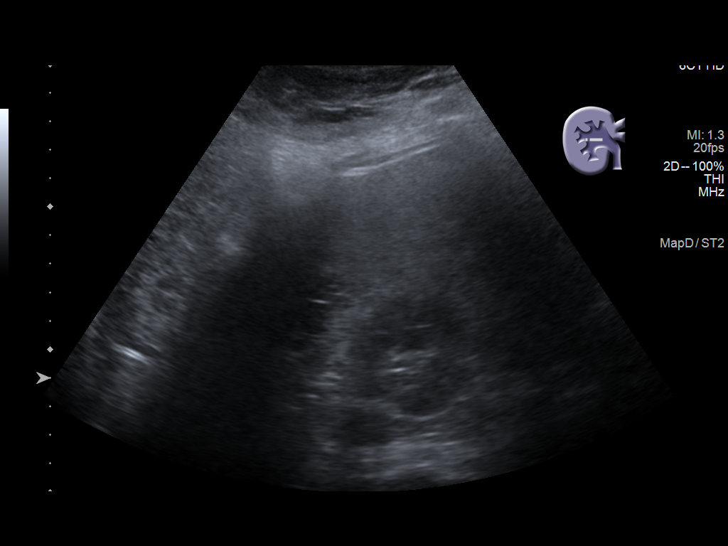
[im 13/39]
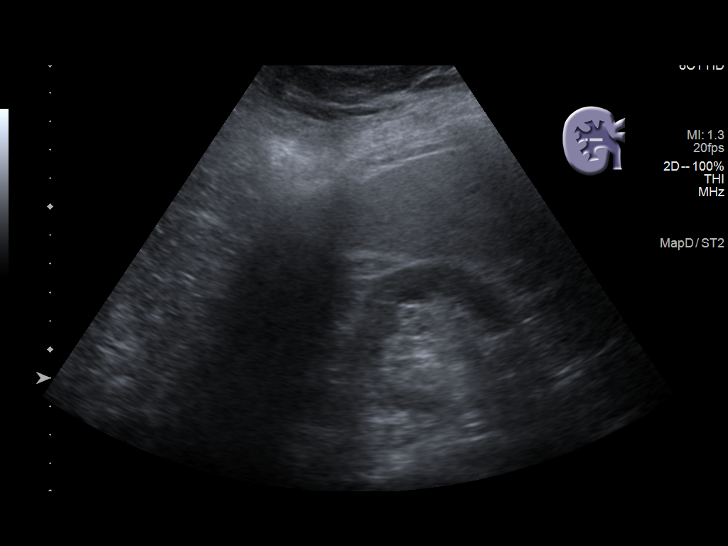
[im 15/39]
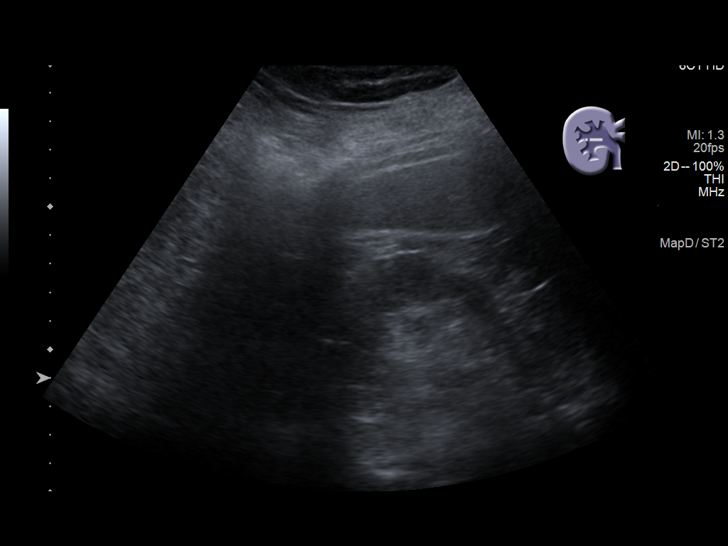
[im 18/39]
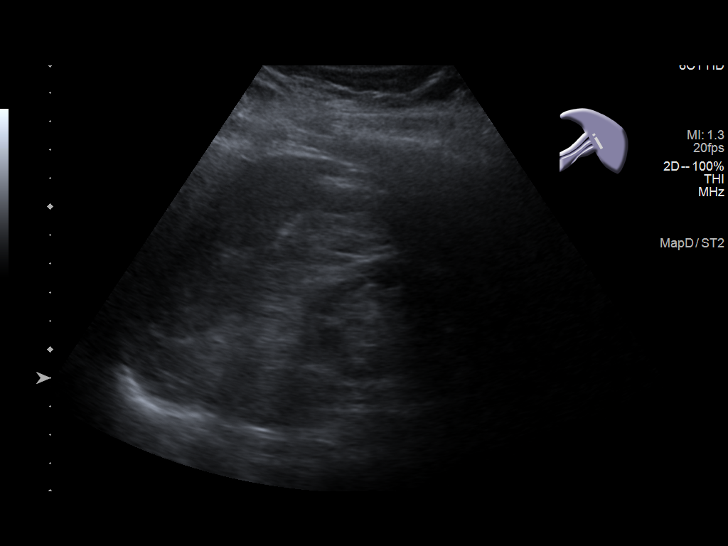
[im 21/39]
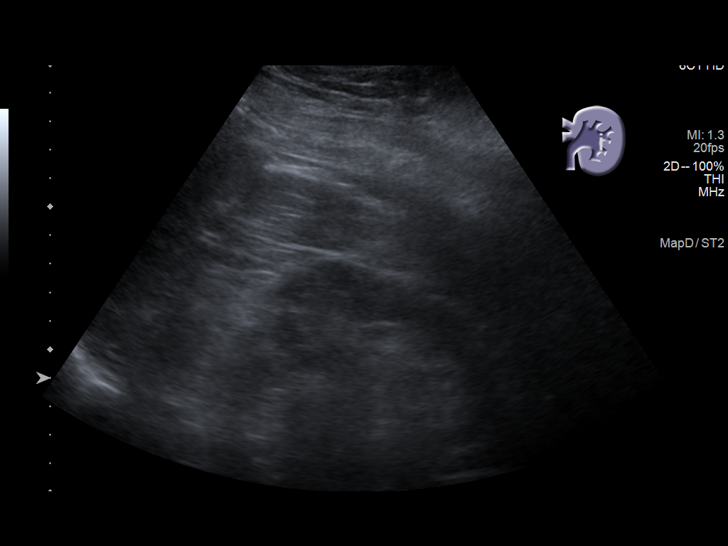
[im 24/39]
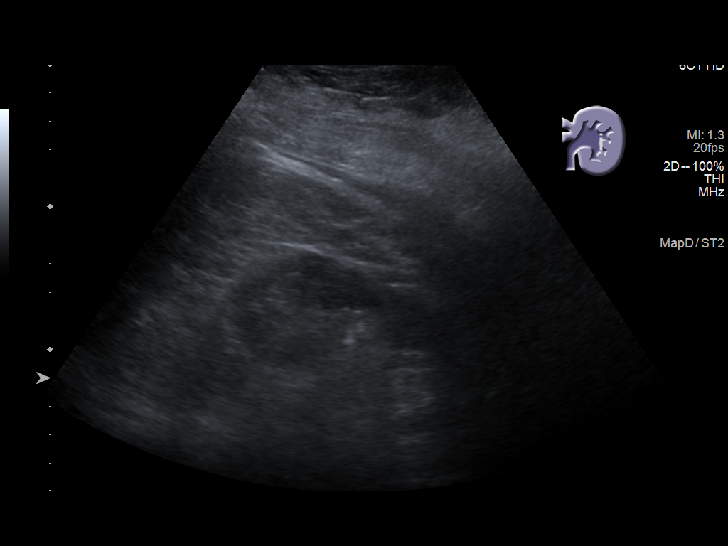
[im 26/39]
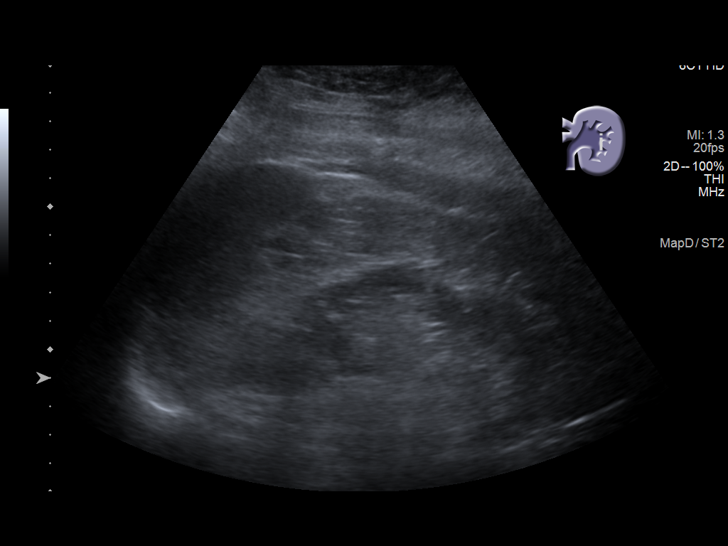
[im 29/39]
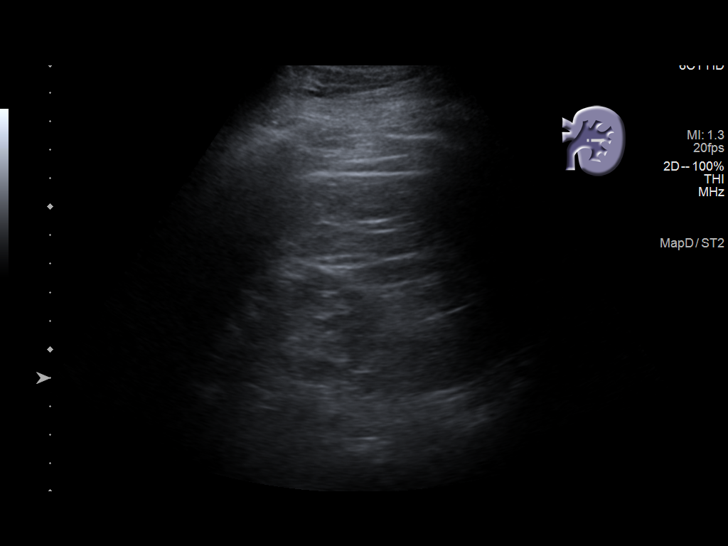
[im 32/39]
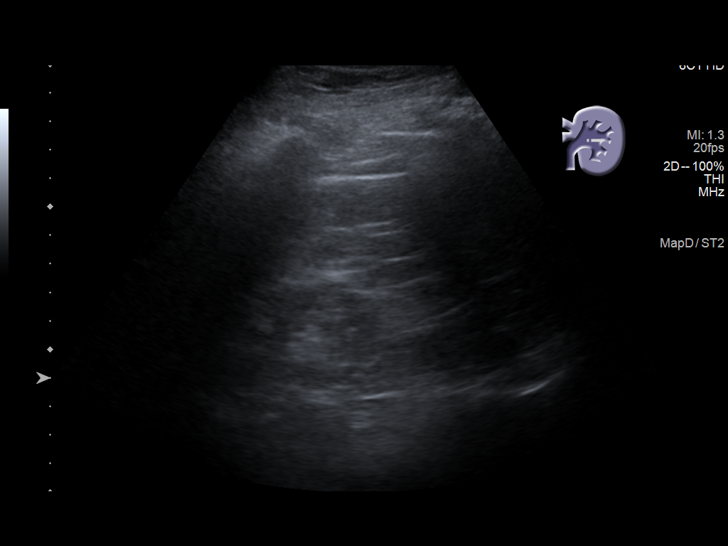
[im 35/39]
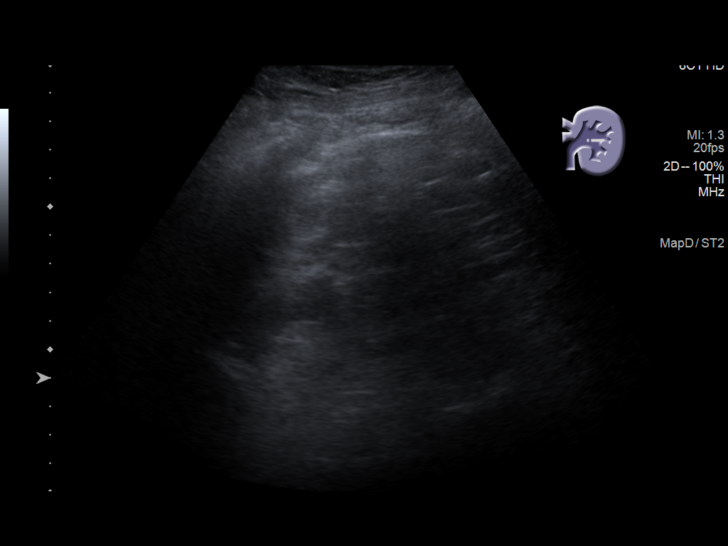
[im 39/39]
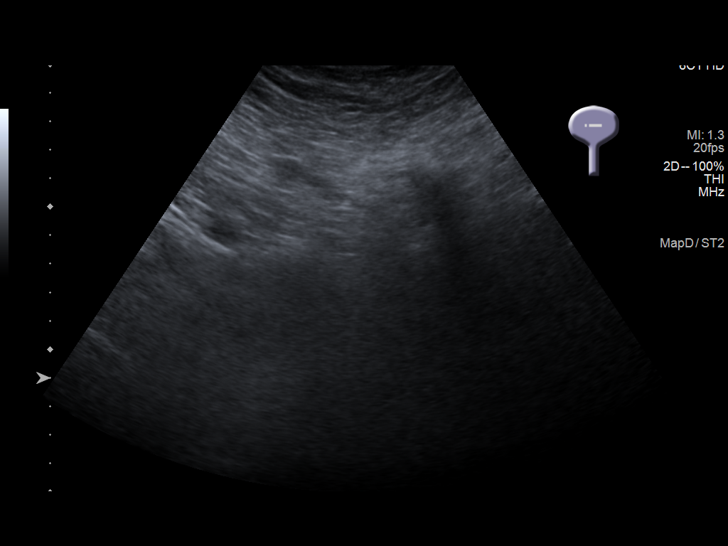

[14 of 25 positions shown; findings below may reference images not displayed]

FINDINGS: Right Kidney:

Renal measurements: 9.2 x 5.4 x 6.0 cm = volume: 155 mL.
Echogenicity within normal limits. No mass or hydronephrosis
visualized.

Left Kidney:

Renal measurements: 9.7 x 5.3 x 5.0 cm = volume: 133 mL.
Echogenicity within normal limits. No mass or hydronephrosis
visualized.

Bladder:

Not visualized due to under distension.

Other:

Diffuse increased echogenicity of the visualized portions of the
hepatic parenchyma are a nonspecific indicator of hepatocellular
dysfunction, most commonly steatosis.
IMPRESSION: No significant sonographic abnormality of the kidneys.

## 2021-02-09 ENCOUNTER — Ambulatory Visit: Payer: Medicare HMO | Admitting: Nutrition

## 2021-02-13 DIAGNOSIS — R69 Illness, unspecified: Secondary | ICD-10-CM | POA: Diagnosis not present

## 2021-02-16 ENCOUNTER — Other Ambulatory Visit: Payer: Self-pay

## 2021-02-16 ENCOUNTER — Encounter: Payer: Medicare HMO | Attending: Internal Medicine | Admitting: Nutrition

## 2021-02-16 VITALS — Ht 63.0 in | Wt 222.2 lb

## 2021-02-16 DIAGNOSIS — E782 Mixed hyperlipidemia: Secondary | ICD-10-CM | POA: Insufficient documentation

## 2021-02-16 DIAGNOSIS — I10 Essential (primary) hypertension: Secondary | ICD-10-CM | POA: Diagnosis not present

## 2021-02-16 DIAGNOSIS — E1165 Type 2 diabetes mellitus with hyperglycemia: Secondary | ICD-10-CM | POA: Diagnosis not present

## 2021-02-16 DIAGNOSIS — E669 Obesity, unspecified: Secondary | ICD-10-CM | POA: Diagnosis not present

## 2021-02-16 DIAGNOSIS — N183 Chronic kidney disease, stage 3 unspecified: Secondary | ICD-10-CM | POA: Diagnosis not present

## 2021-02-16 DIAGNOSIS — C50911 Malignant neoplasm of unspecified site of right female breast: Secondary | ICD-10-CM | POA: Diagnosis not present

## 2021-02-16 DIAGNOSIS — E118 Type 2 diabetes mellitus with unspecified complications: Secondary | ICD-10-CM | POA: Insufficient documentation

## 2021-02-16 NOTE — Progress Notes (Signed)
Medical Nutrition Therapy   Appointment Start time:  [phone] Appointment End time: 1045Primary concerns today: Diabetes Follow up visit Referral diagnosis: E11.8  Preferred learning style:  no preference indicated Learning readiness:  change in progress  Changes made:  Saw Dr. [place] Crane and he changed to Synjardy 25/1000 mg- Has been itching a lot and has a rash. He will call MD to discuss if related to [facility]  Recently saw a nephrologist, Dr, Theador Hawthorne. Potassium is elevated, Hgb low. Creatine High 1.7. Stg 3 b CKD.  7 day avg 225, 14 was 194 and 30  205 and 90 212  FBS:148 -215 mg/dl. Diet and lifestyle still have room for improvement. Trying to make better choices. Trying to eat out less often.  NUTRITION ASSESSMENT  Blood sugars are still poorly controlled. No weight loss. Anthropometrics  Wt Readings from Last 3 Encounters:  01/11/21 225 lb (102.1 kg)  01/09/21 224 lb (101.6 kg)  09/12/20 226 lb 14.4 oz (102.9 kg)   Ht Readings from Last 3 Encounters:  01/09/21 5\' 3"  (1.6 m)  07/16/19 5\' 3"  (1.6 m)  07/09/19 5\' 3"  (1.6 m)   There is no height or weight on file to calculate BMI. @BMIFA @ Facility age limit for growth percentiles is 20 years. Facility age limit for growth percentiles is 20 years.   Clinical Medical Hx: HTN, Hyperlipidemia, DM Type 2, Vit D deficiency, obesity, osteoarthritis, CKD, Cancer in right breast  Medication Synjardy 25/1000 mg   Results for Krista Payne, Krista Payne (MRN [id number]) as of 01/05/2021 16:06  Ref. Range 09/05/2020 12:26  COMPREHENSIVE METABOLIC PANEL Unknown Rpt (A)  Sodium Latest Ref Range: 135 - 145 mmol/L 135  Potassium Latest Ref Range: 3.5 - 5.1 mmol/L 5.3 (H)  Chloride Latest Ref Range: 98 - 111 mmol/L 104  CO2 Latest Ref Range: 22 - 32 mmol/L 24  Glucose Latest Ref Range: 70 - 99 mg/dL 143 (H)  BUN Latest Ref Range: 8 - 23 mg/dL 37 (H)  Creatinine Latest Ref Range: 0.44 - 1.00 mg/dL 1.52 (H)  Calcium Latest Ref Range: 8.9 - 10.3 mg/dL  10.1  Anion gap Latest Ref Range: 5 - 15  7  Alkaline Phosphatase Latest Ref Range: 38 - 126 U/L 54  Albumin Latest Ref Range: 3.5 - 5.0 g/dL 4.1  AST Latest Ref Range: 15 - 41 U/L 15  ALT Latest Ref Range: 0 - 44 U/L 14  Total Protein Latest Ref Range: 6.5 - 8.1 g/dL 7.2  Total Bilirubin Latest Ref Range: 0.3 - 1.2 mg/dL 0.6  GFR, Estimated Latest Ref Range: >60 mL/min 35 (L)  Vitamin D, 25-Hydroxy Latest Ref Range: 30 - 100 ng/mL 30.23  WBC Latest Ref Range: 4.0 - 10.5 K/uL 6.9  RBC Latest Ref Range: 3.87 - 5.11 MIL/uL 3.66 (L)  Hemoglobin Latest Ref Range: 12.0 - 15.0 g/dL 10.8 (L)  HCT Latest Ref Range: 36.0 - 46.0 % 34.1 (L)  MCV Latest Ref Range: 80.0 - 100.0 fL 93.2  MCH Latest Ref Range: 26.0 - 34.0 pg 29.5  MCHC Latest Ref Range: 30.0 - 36.0 g/dL 31.7  RDW Latest Ref Range: 11.5 - 15.5 % 12.4  Platelets Latest Ref Range: 150 - 400 K/uL 323  nRBC Latest Ref Range: 0.0 - 0.2 % 0.0  Neutrophils Latest Units: % 64  Lymphocytes Latest Units: % 16  Monocytes Relative Latest Units: % 10  Eosinophil Latest Units: % 9  Basophil Latest Units: % 1  Immature Granulocytes Latest Units: % 0  NEUT#  Latest Ref Range: 1.7 - 7.7 K/uL 4.4  Lymphocyte # Latest Ref Range: 0.7 - 4.0 K/uL 1.1  Monocyte # Latest Ref Range: 0.1 - 1.0 K/uL 0.7  Eosinophils Absolute Latest Ref Range: 0.0 - 0.5 K/uL 0.6 (H)  Basophils Absolute Latest Ref Range: 0.0 - 0.1 K/uL 0.1  Abs Immature Granulocytes Latest Ref Range: 0.00 - 0.07 K/uL 0.02  s:Jardiance. Labs.  Notable Signs/Symptoms: fatigue, thirst, frequent urination,  Her diet is poor and not appropriate for a diabetic that needs weight loss and has brest cancer. She needs a high plant based diet and avoiding processed and convenient foods. Diet is high in salt, potassium and fat which is contraindicated for her current medical issues.  Lifestyle & Dietary Hx Had treatment for r breast cancer. Has CKD. Sees Dr. Allyn Kenner PCP. Is on Jardiance. She  doesn't cook much and eats out mostly.  Estimated daily fluid intake: 24-30oz Supplements:Vit D Sleep: varies  Stress / self-care: stress of cancer Current average weekly physical activity: ADL   24-Hr Dietary Recall First Meal: Cornflakes or rice krispies,  snack: Second Meal: MOW Tv dinner, Water Snack:  Third Meal: skips if not hungry or misc Snack:  Beverages: water.  Estimated Energy Needs Calories: 1200 Carbohydrate: 135g Protein: 90g Fat: 33g   NUTRITION DIAGNOSIS  NB-1.1 Food and nutrition-related knowledge deficit As related to Diabetes Type 2.  As evidenced by A1C    NUTRITION INTERVENTION  Nutrition education (E-1) on the following topics:  . Low Potassium, and low sodium foods . Importance of fresh fruits and vegetables for cancer protection, lowering blood sugars and needed weight loss. Marland Kitchen Healthy diet for cancer and weight loss  Handouts Provided Include    My plate, diabetes instructions  Learning Style & Readiness for Change Teaching method utilized: Visual & Auditory  Demonstrated degree of understanding via: Teach Back  Barriers to learning/adherence to lifestyle change: none  Goals Established by Pt . Follow MY Plate . Avoids high in potassium . Increase fresh fruits and vegetables. . Cut out processed high salt high potassium foods . Drink only water . Walk 15 minutes twice a day . Don't skip meals . Avoid processed foods and concentrated foods. Get FBS less than 150 and less than 180 at bedtime  MONITORING & EVALUATION Dietary intake, weekly physical activity, and  Blood sugars in 1 month.  Next Steps  Patient is to work on improving what she is eating..   May choose another medication for treating DM since she is  At higher risk for fourneir's gangrene with obesity using Jardiance or any SGL-2 medications.  Recommend to consider adding Glipizide and a GLP for improved blood sugar control.

## 2021-02-16 NOTE — Patient Instructions (Signed)
Goals  Do not eat after 7 pm at night Drink only water -4-5 bottles per day; 64  Oz per day. Avoid processed and salty foods Avoid foods high in potassium Goals AM BS below 150 and bedtime below 150-180 mg/dl. Cut out cereal or snacks after supper.

## 2021-02-27 ENCOUNTER — Other Ambulatory Visit (HOSPITAL_COMMUNITY): Payer: Self-pay | Admitting: Surgery

## 2021-02-27 DIAGNOSIS — C50911 Malignant neoplasm of unspecified site of right female breast: Secondary | ICD-10-CM

## 2021-02-27 MED ORDER — ANASTROZOLE 1 MG PO TABS: 1.0000 mg | ORAL_TABLET | Freq: Every day | ORAL | 3 refills | Status: DC

## 2021-03-01 ENCOUNTER — Encounter: Payer: Self-pay | Admitting: Nutrition

## 2021-03-03 DIAGNOSIS — E1122 Type 2 diabetes mellitus with diabetic chronic kidney disease: Secondary | ICD-10-CM | POA: Diagnosis not present

## 2021-03-03 DIAGNOSIS — N189 Chronic kidney disease, unspecified: Secondary | ICD-10-CM | POA: Diagnosis not present

## 2021-03-03 DIAGNOSIS — E875 Hyperkalemia: Secondary | ICD-10-CM | POA: Diagnosis not present

## 2021-03-03 DIAGNOSIS — Z79899 Other long term (current) drug therapy: Secondary | ICD-10-CM | POA: Diagnosis not present

## 2021-03-03 DIAGNOSIS — D638 Anemia in other chronic diseases classified elsewhere: Secondary | ICD-10-CM | POA: Diagnosis not present

## 2021-03-03 DIAGNOSIS — I129 Hypertensive chronic kidney disease with stage 1 through stage 4 chronic kidney disease, or unspecified chronic kidney disease: Secondary | ICD-10-CM | POA: Diagnosis not present

## 2021-03-03 DIAGNOSIS — E559 Vitamin D deficiency, unspecified: Secondary | ICD-10-CM | POA: Diagnosis not present

## 2021-03-03 DIAGNOSIS — E871 Hypo-osmolality and hyponatremia: Secondary | ICD-10-CM | POA: Diagnosis not present

## 2021-03-08 DIAGNOSIS — I129 Hypertensive chronic kidney disease with stage 1 through stage 4 chronic kidney disease, or unspecified chronic kidney disease: Secondary | ICD-10-CM | POA: Diagnosis not present

## 2021-03-08 DIAGNOSIS — E875 Hyperkalemia: Secondary | ICD-10-CM | POA: Diagnosis not present

## 2021-03-08 DIAGNOSIS — E1122 Type 2 diabetes mellitus with diabetic chronic kidney disease: Secondary | ICD-10-CM | POA: Diagnosis not present

## 2021-03-08 DIAGNOSIS — E211 Secondary hyperparathyroidism, not elsewhere classified: Secondary | ICD-10-CM | POA: Diagnosis not present

## 2021-03-08 DIAGNOSIS — E1129 Type 2 diabetes mellitus with other diabetic kidney complication: Secondary | ICD-10-CM | POA: Diagnosis not present

## 2021-03-08 DIAGNOSIS — E872 Acidosis: Secondary | ICD-10-CM | POA: Diagnosis not present

## 2021-03-08 DIAGNOSIS — N189 Chronic kidney disease, unspecified: Secondary | ICD-10-CM | POA: Diagnosis not present

## 2021-03-08 DIAGNOSIS — D638 Anemia in other chronic diseases classified elsewhere: Secondary | ICD-10-CM | POA: Diagnosis not present

## 2021-03-08 DIAGNOSIS — R809 Proteinuria, unspecified: Secondary | ICD-10-CM | POA: Diagnosis not present

## 2021-03-09 DIAGNOSIS — I1310 Hypertensive heart and chronic kidney disease without heart failure, with stage 1 through stage 4 chronic kidney disease, or unspecified chronic kidney disease: Secondary | ICD-10-CM | POA: Diagnosis not present

## 2021-03-09 DIAGNOSIS — D0501 Lobular carcinoma in situ of right breast: Secondary | ICD-10-CM | POA: Diagnosis not present

## 2021-03-09 DIAGNOSIS — N1831 Chronic kidney disease, stage 3a: Secondary | ICD-10-CM | POA: Diagnosis not present

## 2021-03-09 DIAGNOSIS — D0511 Intraductal carcinoma in situ of right breast: Secondary | ICD-10-CM | POA: Diagnosis not present

## 2021-03-09 DIAGNOSIS — H409 Unspecified glaucoma: Secondary | ICD-10-CM | POA: Diagnosis not present

## 2021-03-09 DIAGNOSIS — S81801A Unspecified open wound, right lower leg, initial encounter: Secondary | ICD-10-CM | POA: Diagnosis not present

## 2021-03-09 DIAGNOSIS — Z1889 Other specified retained foreign body fragments: Secondary | ICD-10-CM | POA: Diagnosis not present

## 2021-03-09 DIAGNOSIS — R69 Illness, unspecified: Secondary | ICD-10-CM | POA: Diagnosis not present

## 2021-03-09 DIAGNOSIS — E1122 Type 2 diabetes mellitus with diabetic chronic kidney disease: Secondary | ICD-10-CM | POA: Diagnosis not present

## 2021-03-09 DIAGNOSIS — L97919 Non-pressure chronic ulcer of unspecified part of right lower leg with unspecified severity: Secondary | ICD-10-CM | POA: Diagnosis not present

## 2021-03-09 DIAGNOSIS — S30860A Insect bite (nonvenomous) of lower back and pelvis, initial encounter: Secondary | ICD-10-CM | POA: Diagnosis not present

## 2021-03-15 DIAGNOSIS — E1121 Type 2 diabetes mellitus with diabetic nephropathy: Secondary | ICD-10-CM | POA: Diagnosis not present

## 2021-03-15 DIAGNOSIS — N184 Chronic kidney disease, stage 4 (severe): Secondary | ICD-10-CM | POA: Diagnosis not present

## 2021-03-15 DIAGNOSIS — Z131 Encounter for screening for diabetes mellitus: Secondary | ICD-10-CM | POA: Diagnosis not present

## 2021-03-15 DIAGNOSIS — E782 Mixed hyperlipidemia: Secondary | ICD-10-CM | POA: Diagnosis not present

## 2021-03-15 DIAGNOSIS — I10 Essential (primary) hypertension: Secondary | ICD-10-CM | POA: Diagnosis not present

## 2021-03-15 DIAGNOSIS — R809 Proteinuria, unspecified: Secondary | ICD-10-CM | POA: Diagnosis not present

## 2021-03-15 DIAGNOSIS — L97909 Non-pressure chronic ulcer of unspecified part of unspecified lower leg with unspecified severity: Secondary | ICD-10-CM | POA: Diagnosis not present

## 2021-03-15 DIAGNOSIS — E875 Hyperkalemia: Secondary | ICD-10-CM | POA: Diagnosis not present

## 2021-03-15 DIAGNOSIS — H3530 Unspecified macular degeneration: Secondary | ICD-10-CM | POA: Diagnosis not present

## 2021-03-15 DIAGNOSIS — C50911 Malignant neoplasm of unspecified site of right female breast: Secondary | ICD-10-CM | POA: Diagnosis not present

## 2021-03-16 DIAGNOSIS — H35033 Hypertensive retinopathy, bilateral: Secondary | ICD-10-CM | POA: Diagnosis not present

## 2021-03-16 DIAGNOSIS — H353231 Exudative age-related macular degeneration, bilateral, with active choroidal neovascularization: Secondary | ICD-10-CM | POA: Diagnosis not present

## 2021-03-16 DIAGNOSIS — H3540 Unspecified peripheral retinal degeneration: Secondary | ICD-10-CM | POA: Diagnosis not present

## 2021-03-16 DIAGNOSIS — H43813 Vitreous degeneration, bilateral: Secondary | ICD-10-CM | POA: Diagnosis not present

## 2021-03-22 ENCOUNTER — Encounter: Payer: Medicare HMO | Attending: Internal Medicine | Admitting: Nutrition

## 2021-03-22 ENCOUNTER — Encounter: Payer: Self-pay | Admitting: Nutrition

## 2021-03-22 ENCOUNTER — Other Ambulatory Visit: Payer: Self-pay

## 2021-03-22 VITALS — Ht 62.0 in | Wt 222.0 lb

## 2021-03-22 DIAGNOSIS — E669 Obesity, unspecified: Secondary | ICD-10-CM

## 2021-03-22 DIAGNOSIS — E1165 Type 2 diabetes mellitus with hyperglycemia: Secondary | ICD-10-CM

## 2021-03-22 DIAGNOSIS — N183 Chronic kidney disease, stage 3 unspecified: Secondary | ICD-10-CM

## 2021-03-22 DIAGNOSIS — E118 Type 2 diabetes mellitus with unspecified complications: Secondary | ICD-10-CM | POA: Diagnosis not present

## 2021-03-22 DIAGNOSIS — I10 Essential (primary) hypertension: Secondary | ICD-10-CM

## 2021-03-22 DIAGNOSIS — E782 Mixed hyperlipidemia: Secondary | ICD-10-CM | POA: Diagnosis not present

## 2021-03-22 NOTE — Progress Notes (Signed)
Medical Nutrition Therapy DM Follow up  Appointment Start time:  4128 Appointment End time: 1100  Referral diagnosis: E11.8  Preferred learning style:  no preference indicated Learning readiness:  change in progress  Changes made: Now taking Janument instead of Synjardy. NO more itching. On Glipizide 5 mg. Taking Furosemide 20 mg a day now for her fluid. FBS [id number]. Forgets to test it at night frequently. BS at night  130-180's. She has cut out sweets . Most recent A1C was 10.3%. Sees Dr. Nevada Crane.  Recently saw a nephrologist, Dr, Theador Hawthorne. Potassium is elevated, Hgb low. Creatine High 1.7. Stg 3 b CKD. Started her on Furosemide Trying to cut back on milk and will switch to almond milk to see if that helps if it doesn't effect her PHOS too much.  Only walking she gets is walking into the [facility] for her daily lunch meal M-Thursday.   NUTRITION ASSESSMENT  Lost 3 lbs.  Anthropometrics  Wt Readings from Last 3 Encounters:  02/16/21 222 lb 3.2 oz (100.8 kg)  01/11/21 225 lb (102.1 kg)  01/09/21 224 lb (101.6 kg)   Ht Readings from Last 3 Encounters:  02/16/21 5\' 3"  (1.6 m)  01/09/21 5\' 3"  (1.6 m)  07/16/19 5\' 3"  (1.6 m)   There is no height or weight on file to calculate BMI. @BMIFA @ Facility age limit for growth percentiles is 20 years. Facility age limit for growth percentiles is 20 years.   Clinical Medical Hx: HTN, Hyperlipidemia, DM Type 2, Vit D deficiency, obesity, osteoarthritis, CKD, Cancer in right breast  Medication Synjardy 25/1000 mg   Results for JESSIE, COWHER (MRN [id number]) as of 01/05/2021 16:06  Ref. Range 09/05/2020 12:26  COMPREHENSIVE METABOLIC PANEL Unknown Rpt (A)  Sodium Latest Ref Range: 135 - 145 mmol/L 135  Potassium Latest Ref Range: 3.5 - 5.1 mmol/L 5.3 (H)  Chloride Latest Ref Range: 98 - 111 mmol/L 104  CO2 Latest Ref Range: 22 - 32 mmol/L 24  Glucose Latest Ref Range: 70 - 99 mg/dL 143 (H)  BUN Latest Ref Range: 8 - 23 mg/dL 37  (H)  Creatinine Latest Ref Range: 0.44 - 1.00 mg/dL 1.52 (H)  Calcium Latest Ref Range: 8.9 - 10.3 mg/dL 10.1  Anion gap Latest Ref Range: 5 - 15  7  Alkaline Phosphatase Latest Ref Range: 38 - 126 U/L 54  Albumin Latest Ref Range: 3.5 - 5.0 g/dL 4.1  AST Latest Ref Range: 15 - 41 U/L 15  ALT Latest Ref Range: 0 - 44 U/L 14  Total Protein Latest Ref Range: 6.5 - 8.1 g/dL 7.2  Total Bilirubin Latest Ref Range: 0.3 - 1.2 mg/dL 0.6  GFR, Estimated Latest Ref Range: >60 mL/min 35 (L)  Vitamin D, 25-Hydroxy Latest Ref Range: 30 - 100 ng/mL 30.23  WBC Latest Ref Range: 4.0 - 10.5 K/uL 6.9  RBC Latest Ref Range: 3.87 - 5.11 MIL/uL 3.66 (L)  Hemoglobin Latest Ref Range: 12.0 - 15.0 g/dL 10.8 (L)  HCT Latest Ref Range: 36.0 - 46.0 % 34.1 (L)  MCV Latest Ref Range: 80.0 - 100.0 fL 93.2  MCH Latest Ref Range: 26.0 - 34.0 pg 29.5  MCHC Latest Ref Range: 30.0 - 36.0 g/dL 31.7  RDW Latest Ref Range: 11.5 - 15.5 % 12.4  Platelets Latest Ref Range: 150 - 400 K/uL 323  nRBC Latest Ref Range: 0.0 - 0.2 % 0.0  Neutrophils Latest Units: % 64  Lymphocytes Latest Units: % 16  Monocytes Relative Latest Units: %  10  Eosinophil Latest Units: % 9  Basophil Latest Units: % 1  Immature Granulocytes Latest Units: % 0  NEUT# Latest Ref Range: 1.7 - 7.7 K/uL 4.4  Lymphocyte # Latest Ref Range: 0.7 - 4.0 K/uL 1.1  Monocyte # Latest Ref Range: 0.1 - 1.0 K/uL 0.7  Eosinophils Absolute Latest Ref Range: 0.0 - 0.5 K/uL 0.6 (H)  Basophils Absolute Latest Ref Range: 0.0 - 0.1 K/uL 0.1  Abs Immature Granulocytes Latest Ref Range: 0.00 - 0.07 K/uL 0.02  s:Jardiance. Labs.  Notable Signs/Symptoms: fatigue, thirst, frequent urination,  Her diet is poor and not appropriate for a diabetic that needs weight loss and has brest cancer. She needs a high plant based diet and avoiding processed and convenient foods. Diet is high in salt, potassium and fat which is contraindicated for her current medical issues.  Lifestyle &  Dietary Hx Had treatment for r breast cancer. Has CKD. Sees Dr. Allyn Kenner PCP. Is on Jardiance. She doesn't cook much and eats out mostly.  Estimated daily fluid intake: 24-30oz Supplements:Vit D Sleep: varies  Stress / self-care: stress of cancer Current average weekly physical activity: ADL   24-Hr Dietary Recall First Meal: Cornflakes or rice krispies,  snack: Second Meal: MOW Tv dinner, Water Snack:  Third Meal: skips if not hungry or misc Snack:  Beverages: water.  Estimated Energy Needs Calories: 1200 Carbohydrate: 135g Protein: 90g Fat: 33g   NUTRITION DIAGNOSIS  NB-1.1 Food and nutrition-related knowledge deficit As related to Diabetes Type 2.  As evidenced by A1C    NUTRITION INTERVENTION  Nutrition education (E-1) on the following topics:  . Low Potassium, and low sodium foods . Importance of fresh fruits and vegetables for cancer protection, lowering blood sugars and needed weight loss. Marland Kitchen Healthy diet for cancer and weight loss  Handouts Provided Include     Low potassium diet  Learning Style & Readiness for Change Teaching method utilized: Visual & Auditory  Demonstrated degree of understanding via: Teach Back  Barriers to learning/adherence to lifestyle change: none  Goals  Drink almond milk instead of cow's milk- limit 2 glasses per day Increase low carb vegetables  Try Healthy Choice meals for dinner meal Avoid foods that are high in potassium-see list given. Keep drinking water- 5 bottles of water per day Increase walking or exercises at the senior center when you go. Test blood sugars three times per day and record on sheets  Get FBS less than 150 and Bedtime less than 180 mg/dl.   MONITORING & EVALUATION Dietary intake, weekly physical activity, and  Blood sugars in 1 month.  Next Steps  Patient is to work on improving what she is eating.Marland Kitchen

## 2021-03-22 NOTE — Patient Instructions (Addendum)
Goals  Drink almond milk instead of cow's milk- limit 2 glasses per day Increase low carb vegetables  Try Healthy Choice meals for dinner meal Avoid foods that are high in potassium-see list given. Keep drinking water- 5 bottles of water per day Increase walking or exercises at the senior center when you go. Test blood sugars three times per day and record on sheets  Get FBS less than 150 and Bedtime less than 180 mg/dl.

## 2021-04-05 ENCOUNTER — Encounter: Payer: Medicare HMO | Attending: Internal Medicine | Admitting: Nutrition

## 2021-04-05 ENCOUNTER — Encounter: Payer: Self-pay | Admitting: Nutrition

## 2021-04-05 ENCOUNTER — Other Ambulatory Visit: Payer: Self-pay

## 2021-04-05 VITALS — Ht 60.0 in | Wt 225.0 lb

## 2021-04-05 DIAGNOSIS — E1165 Type 2 diabetes mellitus with hyperglycemia: Secondary | ICD-10-CM | POA: Insufficient documentation

## 2021-04-05 DIAGNOSIS — E782 Mixed hyperlipidemia: Secondary | ICD-10-CM

## 2021-04-05 DIAGNOSIS — E669 Obesity, unspecified: Secondary | ICD-10-CM | POA: Insufficient documentation

## 2021-04-05 DIAGNOSIS — I10 Essential (primary) hypertension: Secondary | ICD-10-CM | POA: Insufficient documentation

## 2021-04-05 DIAGNOSIS — N183 Chronic kidney disease, stage 3 unspecified: Secondary | ICD-10-CM | POA: Insufficient documentation

## 2021-04-05 DIAGNOSIS — E118 Type 2 diabetes mellitus with unspecified complications: Secondary | ICD-10-CM | POA: Insufficient documentation

## 2021-04-05 NOTE — Progress Notes (Signed)
Medical Nutrition Therapy DM Follow up  Appointment Start time:  1500  Appointment End time: 1530 Referral diagnosis: E11.8  Preferred learning style:  no preference indicated Learning readiness:  change in progress   7 day avg 129 mg/dl 14 133 mg/dl 30 152 mg/dl.   Changes made: Now taking  Metformin and Jardiance instead of Synjardy. NO more itching. On Glipizide 5 mg. Taking Furosemide 20 mg a day now for her fluid. She has cut out sweets . Most recent A1C was 10.3%. Sees Dr. Nevada Crane. Making better food choices and trying to avoid foods high in potassium and phosphorus.   Recently saw a nephrologist, Dr, Theador Hawthorne. Potassium is elevated, Hgb low. Creatine High 1.7. Stg 3 b CKD. Started her on Furosemide Trying to cut back on milk and will switch to almond milk to see if that helps if it doesn't effect her PHOS too much.  Only walking she gets is walking into the [facility] for her daily lunch meal M-Thursday.   NUTRITION ASSESSMENT  Lost 3 lbs.  Anthropometrics  Wt Readings from Last 3 Encounters:  03/22/21 222 lb (100.7 kg)  02/16/21 222 lb 3.2 oz (100.8 kg)  01/11/21 225 lb (102.1 kg)   Ht Readings from Last 3 Encounters:  03/22/21 5\' 2"  (1.575 m)  02/16/21 5\' 3"  (1.6 m)  01/09/21 5\' 3"  (1.6 m)   There is no height or weight on file to calculate BMI. @BMIFA @ Facility age limit for growth percentiles is 20 years. Facility age limit for growth percentiles is 20 years.   Clinical Medical Hx: HTN, Hyperlipidemia, DM Type 2, Vit D deficiency, obesity, osteoarthritis, CKD,  Cancer in right breast  Medication Metformin 500 mg once a day, Jardiance 10 mg a day.   Results for Krista Payne, Krista Payne (MRN [id number]) as of 01/05/2021 16:06  Ref. Range 09/05/2020 12:26  COMPREHENSIVE METABOLIC PANEL Unknown Rpt (A)  Sodium Latest Ref Range: 135 - 145 mmol/L 135  Potassium Latest Ref Range: 3.5 - 5.1 mmol/L 5.3 (H)  Chloride Latest Ref Range: 98 - 111 mmol/L 104  CO2 Latest Ref  Range: 22 - 32 mmol/L 24  Glucose Latest Ref Range: 70 - 99 mg/dL 143 (H)  BUN Latest Ref Range: 8 - 23 mg/dL 37 (H)  Creatinine Latest Ref Range: 0.44 - 1.00 mg/dL 1.52 (H)  Calcium Latest Ref Range: 8.9 - 10.3 mg/dL 10.1  Anion gap Latest Ref Range: 5 - 15  7  Alkaline Phosphatase Latest Ref Range: 38 - 126 U/L 54  Albumin Latest Ref Range: 3.5 - 5.0 g/dL 4.1  AST Latest Ref Range: 15 - 41 U/L 15  ALT Latest Ref Range: 0 - 44 U/L 14  Total Protein Latest Ref Range: 6.5 - 8.1 g/dL 7.2  Total Bilirubin Latest Ref Range: 0.3 - 1.2 mg/dL 0.6  GFR, Estimated Latest Ref Range: >60 mL/min 35 (L)  Vitamin D, 25-Hydroxy Latest Ref Range: 30 - 100 ng/mL 30.23  WBC Latest Ref Range: 4.0 - 10.5 K/uL 6.9  RBC Latest Ref Range: 3.87 - 5.11 MIL/uL 3.66 (L)  Hemoglobin Latest Ref Range: 12.0 - 15.0 g/dL 10.8 (L)  HCT Latest Ref Range: 36.0 - 46.0 % 34.1 (L)  MCV Latest Ref Range: 80.0 - 100.0 fL 93.2  MCH Latest Ref Range: 26.0 - 34.0 pg 29.5  MCHC Latest Ref Range: 30.0 - 36.0 g/dL 31.7  RDW Latest Ref Range: 11.5 - 15.5 % 12.4  Platelets Latest Ref Range: 150 - 400 K/uL 323  nRBC Latest Ref Range: 0.0 - 0.2 % 0.0  Neutrophils Latest Units: % 64  Lymphocytes Latest Units: % 16  Monocytes Relative Latest Units: % 10  Eosinophil Latest Units: % 9  Basophil Latest Units: % 1  Immature Granulocytes Latest Units: % 0  NEUT# Latest Ref Range: 1.7 - 7.7 K/uL 4.4  Lymphocyte # Latest Ref Range: 0.7 - 4.0 K/uL 1.1  Monocyte # Latest Ref Range: 0.1 - 1.0 K/uL 0.7  Eosinophils Absolute Latest Ref Range: 0.0 - 0.5 K/uL 0.6 (H)  Basophils Absolute Latest Ref Range: 0.0 - 0.1 K/uL 0.1  Abs Immature Granulocytes Latest Ref Range: 0.00 - 0.07 K/uL 0.02   Labs.  Notable Signs/Symptoms: fatigue, thirst, frequent urination,  Her diet is improving slowly.  Lifestyle & Dietary Hx Had treatment for r breast cancer. Has CKD. Sees Dr. Allyn Kenner PCP. Is on Jardiance and Metformin. She doesn't cook much and  eats out mostly.  Estimated daily fluid intake: 24-30oz Supplements:Vit D Sleep: varies  Stress / self-care: stress of cancer Current average weekly physical activity: ADL   24-Hr Dietary Recall B) Conflakes with milk L broccoli, carrots and chicken, milk D) Fruit salad,  Water   Estimated Energy Needs Calories: 1200 Carbohydrate: 135g Protein: 90g Fat: 33g   NUTRITION DIAGNOSIS  NB-1.1 Food and nutrition-related knowledge deficit As related to Diabetes Type 2.  As evidenced by A1C 11.3.%  NUTRITION INTERVENTION  Nutrition education (E-1) on the following topics:  Low Potassium, and low sodium foods Importance of fresh fruits and vegetables for cancer protection, lowering blood sugars and needed weight loss. Healthy diet for cancer and weight loss  Handouts Provided Include     Low potassium diet  Learning Style & Readiness for Change Teaching method utilized: Visual & Auditory  Demonstrated degree of understanding via: Teach Back  Barriers to learning/adherence to lifestyle change: none  Goals   Keep up the great job!  Drink almond milk instead of cow's milk- limit 2 glasses per day Increase low carb vegetables  Try Healthy Choice meals for dinner meal Avoid foods that are high in potassium-see list given. Keep drinking water- 5 bottles of water per day Increase walking or exercises at the senior center when you go. Test blood sugars three times per day and record on sheets  Get FBS less than 150 and Bedtime less than 180 mg/dl.   MONITORING & EVALUATION Dietary intake, weekly physical activity, and  Blood sugars in 1 month.  Next Steps  Patient is to work on improving what she is eating..  Recommend a referral to Endocrinology to get better control of her blood sugars.

## 2021-04-05 NOTE — Patient Instructions (Signed)
Goals   Keep up the great job!  Drink almond milk instead of cow's milk- limit 2 glasses per day Increase low carb vegetables  Try Healthy Choice meals for dinner meal Avoid foods that are high in potassium-see list given. Keep drinking water- 5 bottles of water per day Increase walking or exercises at the senior center when you go. Test blood sugars three times per day and record on sheets  Get FBS less than 150 and Bedtime less than 180 mg/dl.

## 2021-04-11 ENCOUNTER — Encounter: Payer: Self-pay | Admitting: Nutrition

## 2021-04-15 DIAGNOSIS — Z131 Encounter for screening for diabetes mellitus: Secondary | ICD-10-CM | POA: Diagnosis not present

## 2021-05-10 ENCOUNTER — Inpatient Hospital Stay (HOSPITAL_COMMUNITY): Payer: Medicare HMO | Attending: Hematology

## 2021-05-10 ENCOUNTER — Other Ambulatory Visit: Payer: Self-pay

## 2021-05-10 DIAGNOSIS — Z7984 Long term (current) use of oral hypoglycemic drugs: Secondary | ICD-10-CM | POA: Diagnosis not present

## 2021-05-10 DIAGNOSIS — Z79899 Other long term (current) drug therapy: Secondary | ICD-10-CM | POA: Diagnosis not present

## 2021-05-10 DIAGNOSIS — C50912 Malignant neoplasm of unspecified site of left female breast: Secondary | ICD-10-CM | POA: Diagnosis not present

## 2021-05-10 DIAGNOSIS — Z79811 Long term (current) use of aromatase inhibitors: Secondary | ICD-10-CM | POA: Insufficient documentation

## 2021-05-10 DIAGNOSIS — I10 Essential (primary) hypertension: Secondary | ICD-10-CM | POA: Insufficient documentation

## 2021-05-10 DIAGNOSIS — N184 Chronic kidney disease, stage 4 (severe): Secondary | ICD-10-CM | POA: Diagnosis not present

## 2021-05-10 DIAGNOSIS — E785 Hyperlipidemia, unspecified: Secondary | ICD-10-CM | POA: Diagnosis not present

## 2021-05-10 DIAGNOSIS — C50911 Malignant neoplasm of unspecified site of right female breast: Secondary | ICD-10-CM

## 2021-05-10 DIAGNOSIS — Z170 Estrogen receptor positive status [ER+]: Secondary | ICD-10-CM | POA: Insufficient documentation

## 2021-05-10 DIAGNOSIS — E559 Vitamin D deficiency, unspecified: Secondary | ICD-10-CM | POA: Diagnosis not present

## 2021-05-10 DIAGNOSIS — Z7952 Long term (current) use of systemic steroids: Secondary | ICD-10-CM | POA: Insufficient documentation

## 2021-05-10 LAB — COMPREHENSIVE METABOLIC PANEL
ALT: 12 U/L (ref 0–44)
AST: 14 U/L — ABNORMAL LOW (ref 15–41)
Albumin: 3.9 g/dL (ref 3.5–5.0)
Alkaline Phosphatase: 55 U/L (ref 38–126)
Anion gap: 6 (ref 5–15)
BUN: 31 mg/dL — ABNORMAL HIGH (ref 8–23)
CO2: 26 mmol/L (ref 22–32)
Calcium: 9.6 mg/dL (ref 8.9–10.3)
Chloride: 105 mmol/L (ref 98–111)
Creatinine, Ser: 1.79 mg/dL — ABNORMAL HIGH (ref 0.44–1.00)
GFR, Estimated: 28 mL/min — ABNORMAL LOW (ref >60)
Glucose, Bld: 157 mg/dL — ABNORMAL HIGH (ref 70–99)
Potassium: 5.1 mmol/L (ref 3.5–5.1)
Sodium: 137 mmol/L (ref 135–145)
Total Bilirubin: 0.6 mg/dL (ref 0.3–1.2)
Total Protein: 7.3 g/dL (ref 6.5–8.1)

## 2021-05-10 LAB — CBC WITH DIFFERENTIAL/PLATELET
Abs Immature Granulocytes: 0.03 10*3/uL (ref 0.00–0.07)
Basophils Absolute: 0.1 10*3/uL (ref 0.0–0.1)
Basophils Relative: 1 %
Eosinophils Absolute: 0.4 10*3/uL (ref 0.0–0.5)
Eosinophils Relative: 5 %
HCT: 33.6 % — ABNORMAL LOW (ref 36.0–46.0)
Hemoglobin: 10.7 g/dL — ABNORMAL LOW (ref 12.0–15.0)
Immature Granulocytes: 0 %
Lymphocytes Relative: 13 %
Lymphs Abs: 1.1 10*3/uL (ref 0.7–4.0)
MCH: 30.5 pg (ref 26.0–34.0)
MCHC: 31.8 g/dL (ref 30.0–36.0)
MCV: 95.7 fL (ref 80.0–100.0)
Monocytes Absolute: 0.7 10*3/uL (ref 0.1–1.0)
Monocytes Relative: 9 %
Neutro Abs: 5.8 10*3/uL (ref 1.7–7.7)
Neutrophils Relative %: 72 %
Platelets: 321 10*3/uL (ref 150–400)
RBC: 3.51 MIL/uL — ABNORMAL LOW (ref 3.87–5.11)
RDW: 13 % (ref 11.5–15.5)
WBC: 8.1 10*3/uL (ref 4.0–10.5)
nRBC: 0 % (ref 0.0–0.2)

## 2021-05-15 DIAGNOSIS — R69 Illness, unspecified: Secondary | ICD-10-CM | POA: Diagnosis not present

## 2021-05-17 ENCOUNTER — Inpatient Hospital Stay (HOSPITAL_BASED_OUTPATIENT_CLINIC_OR_DEPARTMENT_OTHER): Payer: Medicare HMO | Admitting: Hematology and Oncology

## 2021-05-17 ENCOUNTER — Other Ambulatory Visit: Payer: Self-pay

## 2021-05-17 ENCOUNTER — Encounter (HOSPITAL_COMMUNITY): Payer: Self-pay | Admitting: Hematology and Oncology

## 2021-05-17 VITALS — BP 138/59 | HR 63 | Temp 98.0°F | Resp 16 | Wt 234.1 lb

## 2021-05-17 DIAGNOSIS — N184 Chronic kidney disease, stage 4 (severe): Secondary | ICD-10-CM

## 2021-05-17 DIAGNOSIS — I10 Essential (primary) hypertension: Secondary | ICD-10-CM | POA: Diagnosis not present

## 2021-05-17 DIAGNOSIS — Z7984 Long term (current) use of oral hypoglycemic drugs: Secondary | ICD-10-CM | POA: Diagnosis not present

## 2021-05-17 DIAGNOSIS — E785 Hyperlipidemia, unspecified: Secondary | ICD-10-CM | POA: Diagnosis not present

## 2021-05-17 DIAGNOSIS — C50911 Malignant neoplasm of unspecified site of right female breast: Secondary | ICD-10-CM

## 2021-05-17 DIAGNOSIS — Z79811 Long term (current) use of aromatase inhibitors: Secondary | ICD-10-CM | POA: Diagnosis not present

## 2021-05-17 DIAGNOSIS — E559 Vitamin D deficiency, unspecified: Secondary | ICD-10-CM

## 2021-05-17 DIAGNOSIS — C50912 Malignant neoplasm of unspecified site of left female breast: Secondary | ICD-10-CM | POA: Diagnosis not present

## 2021-05-17 DIAGNOSIS — Z170 Estrogen receptor positive status [ER+]: Secondary | ICD-10-CM | POA: Diagnosis not present

## 2021-05-17 DIAGNOSIS — Z7952 Long term (current) use of systemic steroids: Secondary | ICD-10-CM | POA: Diagnosis not present

## 2021-05-17 DIAGNOSIS — Z79899 Other long term (current) drug therapy: Secondary | ICD-10-CM | POA: Diagnosis not present

## 2021-05-17 DIAGNOSIS — N189 Chronic kidney disease, unspecified: Secondary | ICD-10-CM | POA: Insufficient documentation

## 2021-05-17 NOTE — Progress Notes (Signed)
[facility] FOLLOW-UP progress notes  Patient Care Team: Celene Squibb, MD as PCP - General (Internal Medicine)  CHIEF COMPLAINTS/PURPOSE OF VISIT:  Left breast cancer, ongoing antiestrogen therapy  HISTORY OF PRESENTING ILLNESS:  Krista Payne 80 y.o. female is seen because of diagnosis of left breast cancer She is tolerating antiestrogen therapy well without any side effects She denies any recent abnormal breast examination, palpable mass, abnormal breast appearance or nipple changes Her latest MRI from March 2022 was within normal range She is due for mammogram at the end of the year  MEDICAL HISTORY:  Past Medical History:  Diagnosis Date   Chronic kidney disease    Hyperlipidemia    Hypertension    Macular degeneration    Osteoarthritis     SURGICAL HISTORY: Past Surgical History:  Procedure Laterality Date   CATARACT EXTRACTION W/PHACO Right 02/07/2015   Procedure: CATARACT EXTRACTION PHACO AND INTRAOCULAR LENS PLACEMENT ([place]);  Surgeon: Tonny Branch, MD;  Location: [facility];  Service: Ophthalmology;  Laterality: Right;  CDE:12.72   CATARACT EXTRACTION W/PHACO Left 02/24/2015   Procedure: CATARACT EXTRACTION PHACO AND INTRAOCULAR LENS PLACEMENT LEFT EYE;  Surgeon: Tonny Branch, MD;  Location: [facility];  Service: Ophthalmology;  Laterality: Left;  CDE 11.69   COLONOSCOPY      SOCIAL HISTORY: Social History   Socioeconomic History   Marital status: Widowed    Spouse name: Not on file   Number of children: 3   Years of education: Not on file   Highest education level: Not on file  Occupational History   Not on file  Tobacco Use   Smoking status: Never   Smokeless tobacco: Never  Vaping Use   Vaping Use: Never used  Substance and Sexual Activity   Alcohol use: No   Drug use: No   Sexual activity: Never  Other Topics Concern   Not on file  Social History Narrative   Not on file   Social Determinants of Health   Financial Resource Strain: Low Risk     Difficulty of Paying Living Expenses: Not hard at all  Food Insecurity: No Food Insecurity   Worried About Charity fundraiser in the Last Year: Never true   [place] in the Last Year: Never true  Transportation Needs: No Transportation Needs   Lack of Transportation (Medical): No   Lack of Transportation (Non-Medical): No  Physical Activity: Inactive   Days of Exercise per Week: 0 days   Minutes of Exercise per Session: 0 min  Stress: No Stress Concern Present   Feeling of Stress : Not at all  Social Connections: Moderately Integrated   Frequency of Communication with Friends and Family: More than three times a week   Frequency of Social Gatherings with Friends and Family: More than three times a week   Attends Religious Services: More than 4 times per year   Active Member of [employer] or Organizations: Yes   Attends Archivist Meetings: More than 4 times per year   Marital Status: Widowed  Human resources officer Violence: Not At Risk   Fear of Current or Ex-Partner: No   Emotionally Abused: No   Physically Abused: No   Sexually Abused: No    FAMILY HISTORY: Family History  Problem Relation Age of Onset   Macular degeneration Mother    Diabetes Father    Heart disease Daughter     ALLERGIES:  has No Known Allergies.  MEDICATIONS:  Current Outpatient Medications  Medication Sig Dispense Refill   albuterol (VENTOLIN HFA) 108 (90 Base) MCG/ACT inhaler      amLODipine (NORVASC) 5 MG tablet Take 5 mg by mouth daily.     anastrozole (ARIMIDEX) 1 MG tablet Take 1 tablet (1 mg total) by mouth daily. 90 tablet 3   atenolol (TENORMIN) 25 MG tablet Take by mouth daily.     Blood Glucose Monitoring Suppl (FREESTYLE LITE) w/Device KIT USE TO CHECK BLOOD SUGARTONCE DAILY AS DIRECTED     buPROPion (WELLBUTRIN XL) 150 MG 24 hr tablet Take 150 mg by mouth daily.     Calcium-Vitamin D-Vitamin K (VIACTIV CALCIUM PLUS D) 650-12.5-40 MG-MCG-MCG CHEW Chew by mouth daily. Pt is  taking 2 chewables daily     empagliflozin (JARDIANCE) 10 MG TABS tablet Take 10 mg by mouth daily.     FREESTYLE LITE test strip daily.     furosemide (LASIX) 20 MG tablet Take 10 mg by mouth daily.     glipiZIDE (GLUCOTROL XL) 5 MG 24 hr tablet Take 5 mg by mouth daily.     JANUMET XR [phone] MG TB24 Take 1 tablet by mouth daily.     Lancets (FREESTYLE) lancets daily.     levocetirizine (XYZAL) 5 MG tablet Take 5 mg by mouth every evening.     Melatonin 5 MG SUBL Place 1 tablet under the tongue every evening. PRN     Multiple Vitamins-Minerals (PRESERVISION AREDS PO) Take 1 capsule by mouth 2 (two) times daily.     olmesartan (BENICAR) 40 MG tablet Take 40 mg by mouth daily.     predniSONE (DELTASONE) 20 MG tablet      simvastatin (ZOCOR) 20 MG tablet Take 1 tablet (20 mg total) by mouth at bedtime. 90 tablet 3   sodium polystyrene (KAYEXALATE) 15 GM/60ML suspension      triamcinolone ointment (KENALOG) 0.5 % Apply topically 2 (two) times daily. Please give pt a 90 day supply 90 g 1   No current facility-administered medications for this visit.    REVIEW OF SYSTEMS:   Constitutional: Denies fevers, chills or abnormal night sweats Eyes: Denies blurriness of vision, double vision or watery eyes Ears, nose, mouth, throat, and face: Denies mucositis or sore throat Respiratory: Denies cough, dyspnea or wheezes Cardiovascular: Denies palpitation, chest discomfort or lower extremity swelling Gastrointestinal:  Denies nausea, heartburn or change in bowel habits Skin: Denies abnormal skin rashes Lymphatics: Denies new lymphadenopathy or easy bruising Neurological:Denies numbness, tingling or new weaknesses Behavioral/Psych: Mood is stable, no new changes  All other systems were reviewed with the patient and are negative.  PHYSICAL EXAMINATION: ECOG PERFORMANCE STATUS: 0 - Asymptomatic  Vitals:   05/17/21 1507  BP: (!) 138/59  Pulse: 63  Resp: 16  Temp: 98 F (36.7 C)  SpO2: 96%    Filed Weights   05/17/21 1507  Weight: 234 lb 2.1 oz (106.2 kg)    GENERAL:alert, no distress and comfortable SKIN: skin color, texture, turgor are normal, no rashes or significant lesions EYES: normal, conjunctiva are pink and non-injected, sclera clear OROPHARYNX:no exudate, normal lips, buccal mucosa, and tongue  NECK: supple, thyroid normal size, non-tender, without nodularity LYMPH:  no palpable lymphadenopathy in the cervical, axillary or inguinal LUNGS: clear to auscultation and percussion with normal breathing effort HEART: regular rate & rhythm and no murmurs without lower extremity edema ABDOMEN:abdomen soft, non-tender and normal bowel sounds Musculoskeletal:no cyanosis of digits and no clubbing  PSYCH: alert & oriented x 3 with fluent speech  NEURO: no focal motor/sensory deficits Normal breast exam on the right, well-healed surgical scar No abnormalities on the left  LABORATORY DATA:  I have reviewed the data as listed Lab Results  Component Value Date   WBC 8.1 05/10/2021   HGB 10.7 (L) 05/10/2021   HCT 33.6 (L) 05/10/2021   MCV 95.7 05/10/2021   PLT 321 05/10/2021   Recent Labs    06/01/20 1259 09/05/20 1226 01/04/21 1329 05/10/21 1256  NA 135 135 134* 137  K 5.8* 5.3* 5.5* 5.1  CL 105 104 105 105  CO2 22 24 20* 26  GLUCOSE 140* 143* 310* 157*  BUN 29* 37* 53* 31*  CREATININE 1.35* 1.52* 1.67* 1.79*  CALCIUM 9.4 10.1 9.6 9.6  GFRNONAA 37* 35* 31* 28*  GFRAA 43*  --   --   --   PROT 7.4 7.2 7.7 7.3  ALBUMIN 4.2 4.1 3.7 3.9  AST 15 15 13* 14*  ALT $Re'14 14 13 12  'qmq$ ALKPHOS 56 54 61 55  BILITOT 0.6 0.6 0.6 0.6    ASSESSMENT & PLAN:  Infiltrating lobular carcinoma of right breast in female [facility]) Her examination is benign She will continue antiestrogen therapy I will schedule return appointment in 6 months I reminded her the importance of vitamin D supplement to reduce risk of osteopenia/osteoporosis  Chronic kidney disease (CKD), stage IV (severe)  (HCC) Her recent renal function is stable We discussed the importance of adequate hydration and risk factor modification  Vitamin D deficiency She is known to have vitamin D deficiency I reminded her the importance of vitamin D replacement therapy  Orders Placed This Encounter  Procedures   CBC with Differential/Platelet    Standing Status:   Future    Standing Expiration Date:   05/17/2022   Comprehensive metabolic panel    Standing Status:   Future    Standing Expiration Date:   05/17/2022   VITAMIN D 25 Hydroxy (Vit-D Deficiency, Fractures)    Standing Status:   Future    Standing Expiration Date:   05/17/2022    All questions were answered. The patient knows to call the clinic with any problems, questions or concerns. The total time spent in the appointment was 20 minutes encounter with patients including review of chart and various tests results, discussions about plan of care and coordination of care plan   Heath Lark, MD 05/17/2021 4:48 PM

## 2021-05-17 NOTE — Assessment & Plan Note (Signed)
She is known to have vitamin D deficiency I reminded her the importance of vitamin D replacement therapy

## 2021-05-17 NOTE — Assessment & Plan Note (Signed)
Her recent renal function is stable We discussed the importance of adequate hydration and risk factor modification

## 2021-05-17 NOTE — Assessment & Plan Note (Signed)
Her examination is benign She will continue antiestrogen therapy I will schedule return appointment in 6 months I reminded her the importance of vitamin D supplement to reduce risk of osteopenia/osteoporosis

## 2021-06-02 DIAGNOSIS — N189 Chronic kidney disease, unspecified: Secondary | ICD-10-CM | POA: Diagnosis not present

## 2021-06-02 DIAGNOSIS — E211 Secondary hyperparathyroidism, not elsewhere classified: Secondary | ICD-10-CM | POA: Diagnosis not present

## 2021-06-02 DIAGNOSIS — E1122 Type 2 diabetes mellitus with diabetic chronic kidney disease: Secondary | ICD-10-CM | POA: Diagnosis not present

## 2021-06-02 DIAGNOSIS — E1129 Type 2 diabetes mellitus with other diabetic kidney complication: Secondary | ICD-10-CM | POA: Diagnosis not present

## 2021-06-02 DIAGNOSIS — E875 Hyperkalemia: Secondary | ICD-10-CM | POA: Diagnosis not present

## 2021-06-02 DIAGNOSIS — D638 Anemia in other chronic diseases classified elsewhere: Secondary | ICD-10-CM | POA: Diagnosis not present

## 2021-06-02 DIAGNOSIS — R809 Proteinuria, unspecified: Secondary | ICD-10-CM | POA: Diagnosis not present

## 2021-06-02 DIAGNOSIS — I129 Hypertensive chronic kidney disease with stage 1 through stage 4 chronic kidney disease, or unspecified chronic kidney disease: Secondary | ICD-10-CM | POA: Diagnosis not present

## 2021-06-07 ENCOUNTER — Other Ambulatory Visit (HOSPITAL_COMMUNITY): Payer: Self-pay | Admitting: Nephrology

## 2021-06-07 ENCOUNTER — Other Ambulatory Visit: Payer: Self-pay | Admitting: Nephrology

## 2021-06-07 DIAGNOSIS — E872 Acidosis, unspecified: Secondary | ICD-10-CM

## 2021-06-07 DIAGNOSIS — D638 Anemia in other chronic diseases classified elsewhere: Secondary | ICD-10-CM | POA: Diagnosis not present

## 2021-06-07 DIAGNOSIS — E875 Hyperkalemia: Secondary | ICD-10-CM | POA: Diagnosis not present

## 2021-06-07 DIAGNOSIS — N189 Chronic kidney disease, unspecified: Secondary | ICD-10-CM | POA: Diagnosis not present

## 2021-06-07 DIAGNOSIS — I129 Hypertensive chronic kidney disease with stage 1 through stage 4 chronic kidney disease, or unspecified chronic kidney disease: Secondary | ICD-10-CM

## 2021-06-07 DIAGNOSIS — E1122 Type 2 diabetes mellitus with diabetic chronic kidney disease: Secondary | ICD-10-CM

## 2021-06-07 DIAGNOSIS — N170 Acute kidney failure with tubular necrosis: Secondary | ICD-10-CM

## 2021-06-12 DIAGNOSIS — E119 Type 2 diabetes mellitus without complications: Secondary | ICD-10-CM | POA: Diagnosis not present

## 2021-06-12 DIAGNOSIS — I10 Essential (primary) hypertension: Secondary | ICD-10-CM | POA: Diagnosis not present

## 2021-06-12 LAB — CBC AND DIFFERENTIAL
HCT: 31 — AB (ref 36–46)
Hemoglobin: 10.2 — AB (ref 12.0–16.0)
Neutrophils Absolute: 5.2
Platelets: 337 (ref 150–399)
WBC: 7.6

## 2021-06-12 LAB — BASIC METABOLIC PANEL
BUN: 33 — AB (ref 4–21)
CO2: 22 (ref 13–22)
Chloride: 104 (ref 99–108)
Creatinine: 1.7 — AB (ref <=1.1)
Glucose: 141
Potassium: 4.8 (ref 3.4–5.3)
Sodium: 137 (ref 137–147)

## 2021-06-12 LAB — LIPID PANEL
Cholesterol: 173 (ref 0–200)
HDL: 51 (ref 35–70)
LDL Cholesterol: 92
LDL Cholesterol: 92
Triglycerides: 175 — AB (ref 40–160)
Triglycerides: 175 — AB (ref 40–160)

## 2021-06-12 LAB — HEPATIC FUNCTION PANEL
ALT: 9 (ref 7–35)
AST: 12 — AB (ref 13–35)
Alkaline Phosphatase: 50 (ref 25–125)
Bilirubin, Total: 0.4

## 2021-06-12 LAB — COMPREHENSIVE METABOLIC PANEL
Albumin: 4 (ref 3.5–5.0)
Calcium: 9.7 (ref 8.7–10.7)
Globulin: 2.5

## 2021-06-12 LAB — HEMOGLOBIN A1C: Hemoglobin A1C: 6.9

## 2021-06-12 LAB — MICROALBUMIN / CREATININE URINE RATIO: Microalb Creat Ratio: 43

## 2021-06-12 LAB — CBC: RBC: 3.51 — AB (ref 3.87–5.11)

## 2021-06-15 DIAGNOSIS — C50911 Malignant neoplasm of unspecified site of right female breast: Secondary | ICD-10-CM | POA: Diagnosis not present

## 2021-06-15 DIAGNOSIS — H3530 Unspecified macular degeneration: Secondary | ICD-10-CM | POA: Diagnosis not present

## 2021-06-15 DIAGNOSIS — R809 Proteinuria, unspecified: Secondary | ICD-10-CM | POA: Diagnosis not present

## 2021-06-15 DIAGNOSIS — E1121 Type 2 diabetes mellitus with diabetic nephropathy: Secondary | ICD-10-CM | POA: Diagnosis not present

## 2021-06-15 DIAGNOSIS — I10 Essential (primary) hypertension: Secondary | ICD-10-CM | POA: Diagnosis not present

## 2021-06-15 DIAGNOSIS — L97909 Non-pressure chronic ulcer of unspecified part of unspecified lower leg with unspecified severity: Secondary | ICD-10-CM | POA: Diagnosis not present

## 2021-06-15 DIAGNOSIS — E782 Mixed hyperlipidemia: Secondary | ICD-10-CM | POA: Diagnosis not present

## 2021-06-15 DIAGNOSIS — E875 Hyperkalemia: Secondary | ICD-10-CM | POA: Diagnosis not present

## 2021-06-15 DIAGNOSIS — N184 Chronic kidney disease, stage 4 (severe): Secondary | ICD-10-CM | POA: Diagnosis not present

## 2021-06-16 ENCOUNTER — Ambulatory Visit (HOSPITAL_COMMUNITY)
Admission: RE | Admit: 2021-06-16 | Discharge: 2021-06-16 | Disposition: A | Discharge status: Home / Self Care | Payer: Medicare HMO | Source: Ambulatory Visit | Attending: Nephrology | Admitting: Nephrology

## 2021-06-16 ENCOUNTER — Other Ambulatory Visit: Payer: Self-pay

## 2021-06-16 DIAGNOSIS — D638 Anemia in other chronic diseases classified elsewhere: Secondary | ICD-10-CM | POA: Diagnosis not present

## 2021-06-16 DIAGNOSIS — N170 Acute kidney failure with tubular necrosis: Secondary | ICD-10-CM | POA: Diagnosis not present

## 2021-06-16 DIAGNOSIS — I129 Hypertensive chronic kidney disease with stage 1 through stage 4 chronic kidney disease, or unspecified chronic kidney disease: Secondary | ICD-10-CM | POA: Insufficient documentation

## 2021-06-16 DIAGNOSIS — E1122 Type 2 diabetes mellitus with diabetic chronic kidney disease: Secondary | ICD-10-CM | POA: Diagnosis not present

## 2021-06-16 DIAGNOSIS — E872 Acidosis, unspecified: Secondary | ICD-10-CM

## 2021-06-16 DIAGNOSIS — E875 Hyperkalemia: Secondary | ICD-10-CM | POA: Insufficient documentation

## 2021-06-16 DIAGNOSIS — N189 Chronic kidney disease, unspecified: Secondary | ICD-10-CM | POA: Diagnosis not present

## 2021-06-16 IMAGING — US US RENAL
1 series · 14 of 25 positions shown · non-contrast
Comparison: [DATE]

CLINICAL DATA: Chronic kidney disease follow-up

EXAM:
RENAL / URINARY TRACT ULTRASOUND COMPLETE

[Series 1: us renal · 0.23mm/px · 14 of 57 slices shown]
[im 1/57]
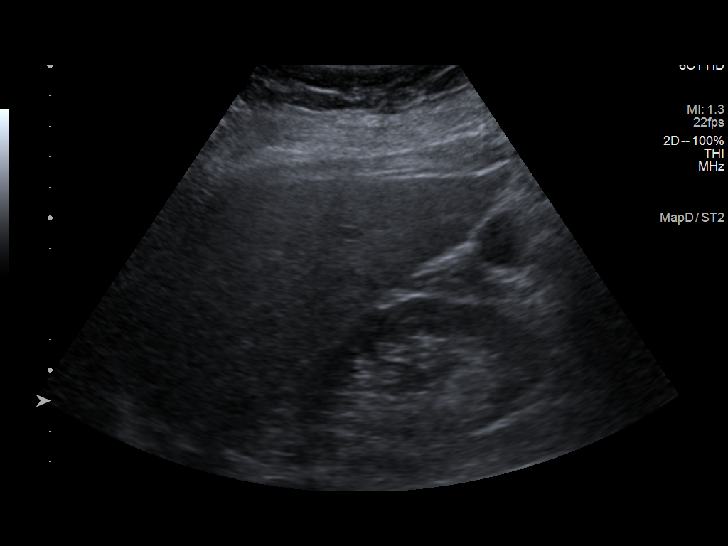
[im 5/57]
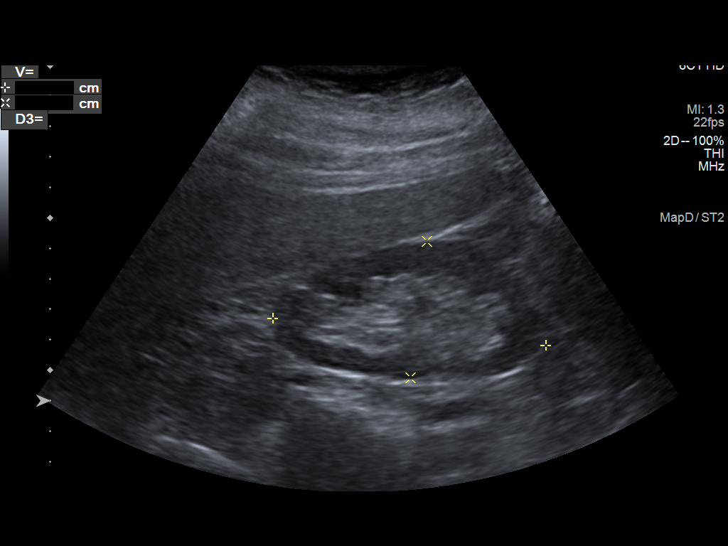
[im 10/57]
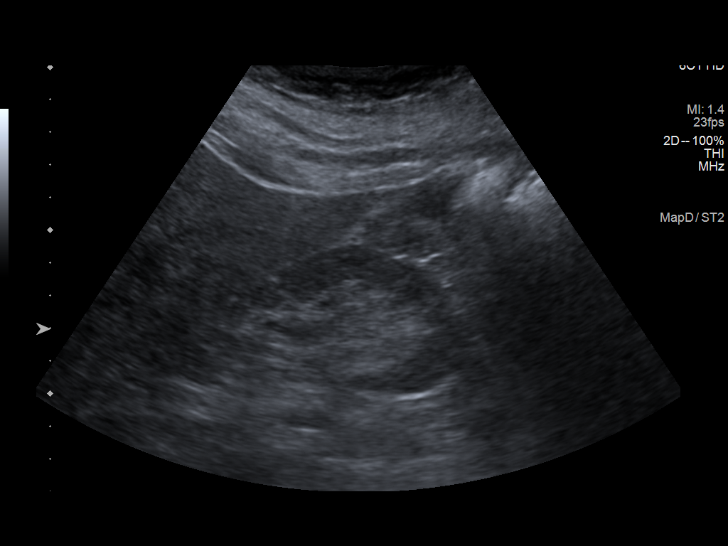
[im 15/57]
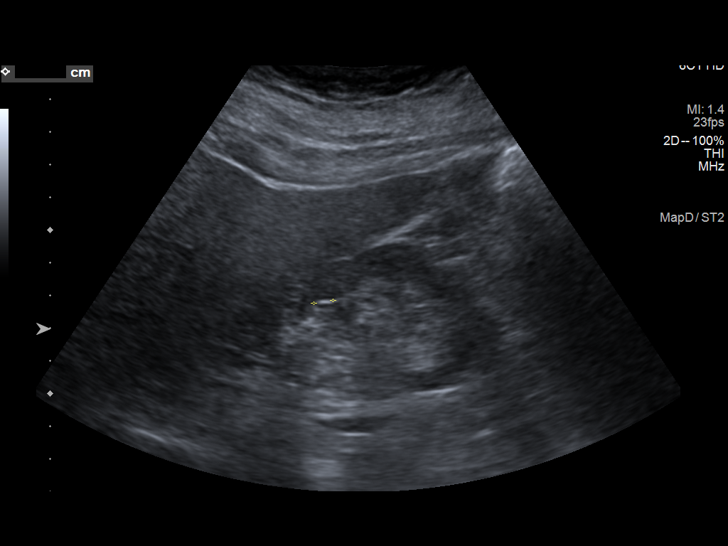
[im 19/57]
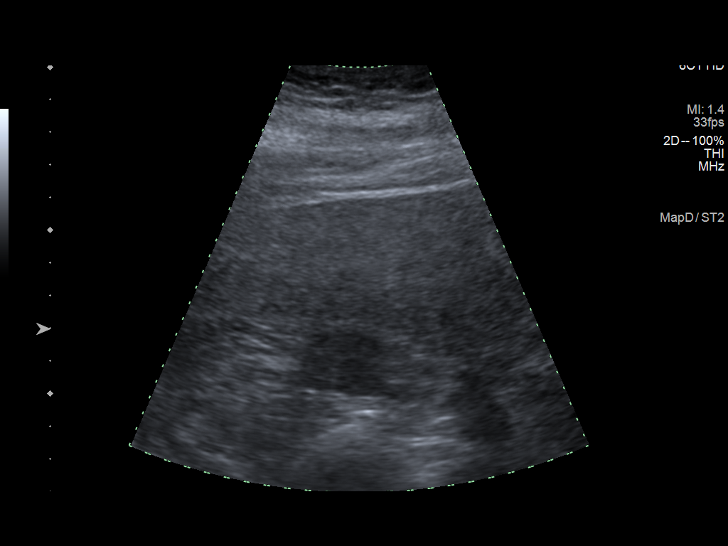
[im 22/57]
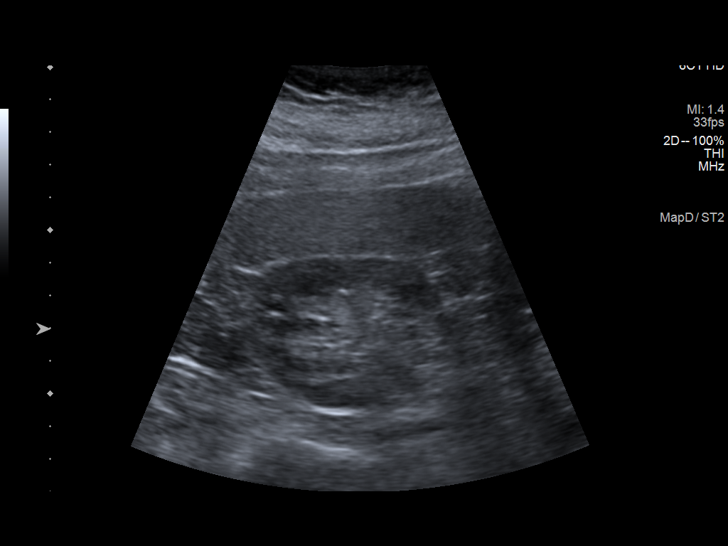
[im 26/57]
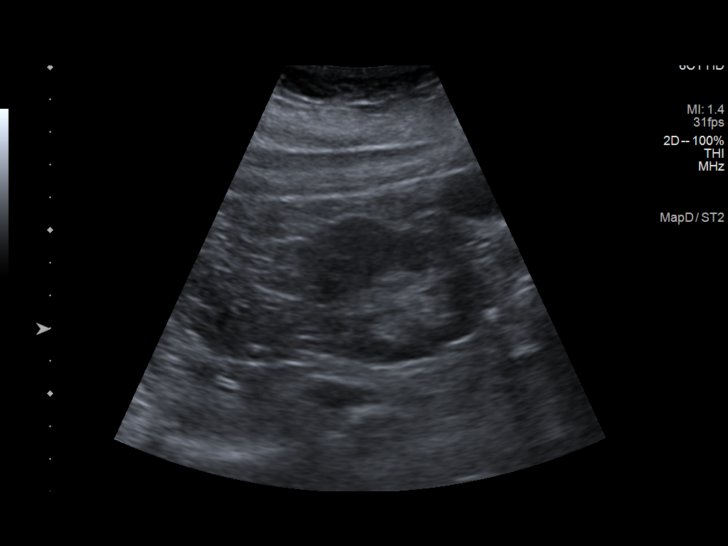
[im 31/57]
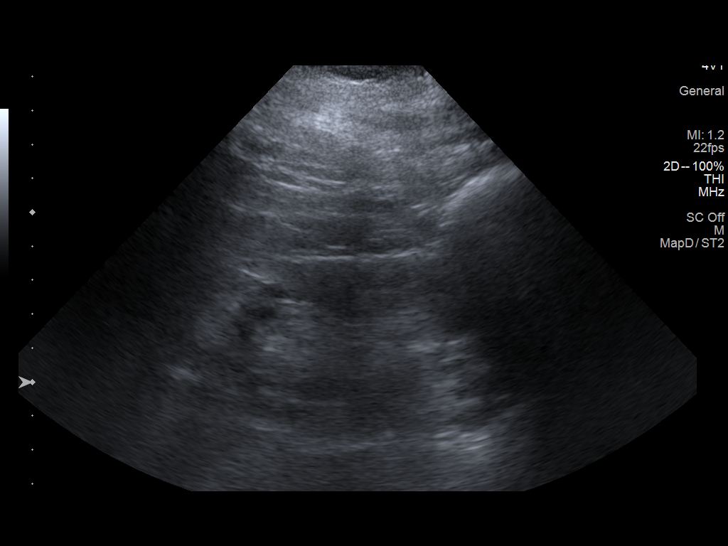
[im 36/57]
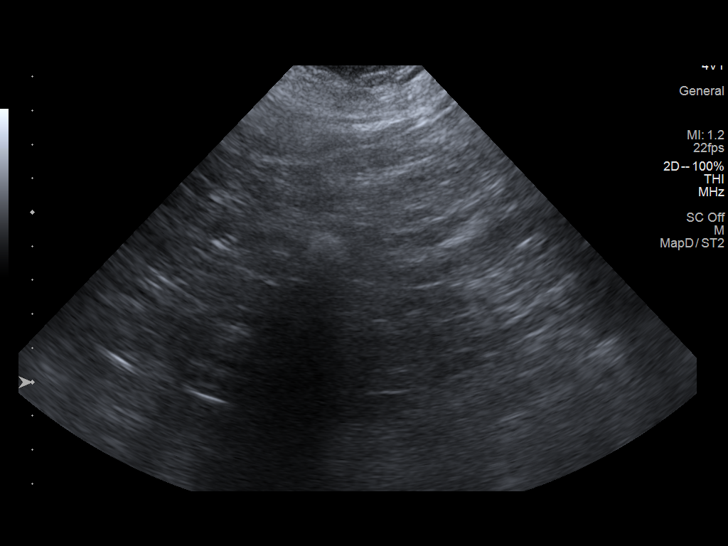
[im 38/57]
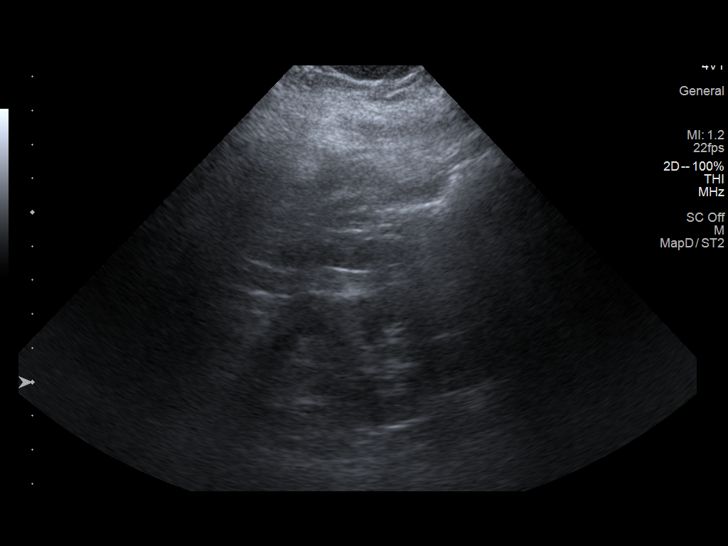
[im 43/57]
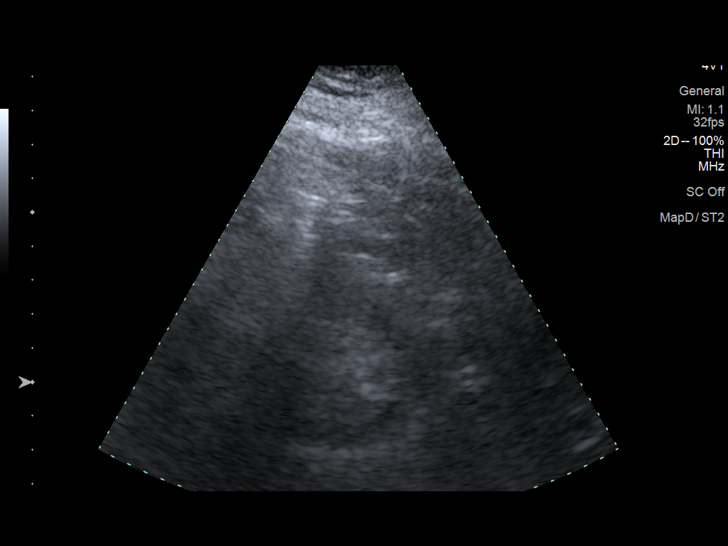
[im 47/57]
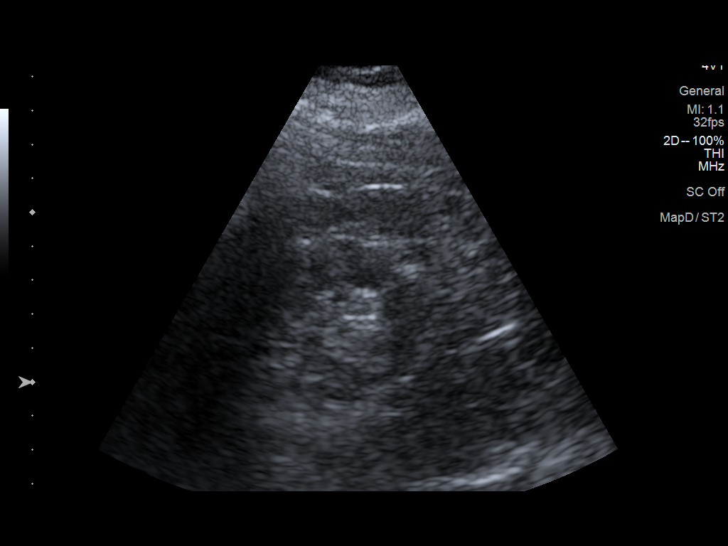
[im 52/57]
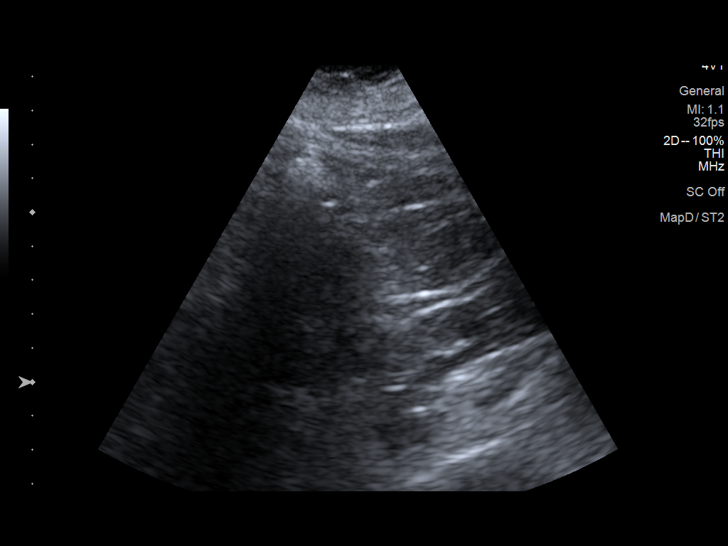
[im 57/57]
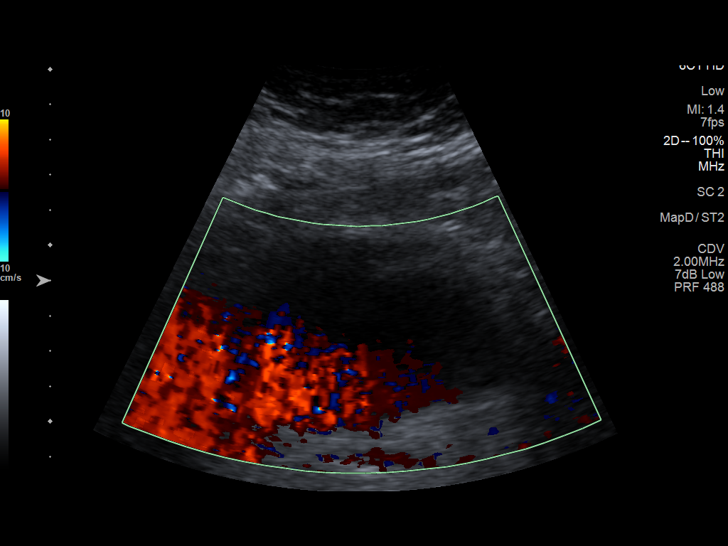

[14 of 25 positions shown; findings below may reference images not displayed]

FINDINGS: Right Kidney:

Renal measurements: 9.0 x 4.5 x 5.5 cm = volume: 117 mL.
Echogenicity within normal limits. No mass or hydronephrosis
visualized. 5 mm echogenic structure in the upper pole of the right
kidney favored to be artifact rather than nonobstructing calculus.

Left Kidney:

Renal measurements: 9.1 x 4.8 x 3.8 cm = volume: 88 mL. Echogenicity
within normal limits. No mass or hydronephrosis visualized.

Bladder:

Appears normal for degree of bladder distention.

Other:

None.
IMPRESSION: 5 mm echogenic structure in the upper pole the right kidney favored
to be artifact rather than nonobstructing calculus. No
hydronephrosis.

## 2021-06-19 DIAGNOSIS — I129 Hypertensive chronic kidney disease with stage 1 through stage 4 chronic kidney disease, or unspecified chronic kidney disease: Secondary | ICD-10-CM | POA: Diagnosis not present

## 2021-06-19 DIAGNOSIS — E1122 Type 2 diabetes mellitus with diabetic chronic kidney disease: Secondary | ICD-10-CM | POA: Diagnosis not present

## 2021-06-19 DIAGNOSIS — E872 Acidosis: Secondary | ICD-10-CM | POA: Diagnosis not present

## 2021-06-19 DIAGNOSIS — N170 Acute kidney failure with tubular necrosis: Secondary | ICD-10-CM | POA: Diagnosis not present

## 2021-06-19 DIAGNOSIS — D638 Anemia in other chronic diseases classified elsewhere: Secondary | ICD-10-CM | POA: Diagnosis not present

## 2021-06-19 DIAGNOSIS — N189 Chronic kidney disease, unspecified: Secondary | ICD-10-CM | POA: Diagnosis not present

## 2021-06-19 DIAGNOSIS — E875 Hyperkalemia: Secondary | ICD-10-CM | POA: Diagnosis not present

## 2021-06-22 DIAGNOSIS — N170 Acute kidney failure with tubular necrosis: Secondary | ICD-10-CM | POA: Diagnosis not present

## 2021-06-22 DIAGNOSIS — E1122 Type 2 diabetes mellitus with diabetic chronic kidney disease: Secondary | ICD-10-CM | POA: Diagnosis not present

## 2021-06-22 DIAGNOSIS — D638 Anemia in other chronic diseases classified elsewhere: Secondary | ICD-10-CM | POA: Diagnosis not present

## 2021-06-22 DIAGNOSIS — N189 Chronic kidney disease, unspecified: Secondary | ICD-10-CM | POA: Diagnosis not present

## 2021-06-22 DIAGNOSIS — R809 Proteinuria, unspecified: Secondary | ICD-10-CM | POA: Diagnosis not present

## 2021-06-22 DIAGNOSIS — E1129 Type 2 diabetes mellitus with other diabetic kidney complication: Secondary | ICD-10-CM | POA: Diagnosis not present

## 2021-06-22 DIAGNOSIS — I160 Hypertensive urgency: Secondary | ICD-10-CM | POA: Diagnosis not present

## 2021-07-10 DIAGNOSIS — H3540 Unspecified peripheral retinal degeneration: Secondary | ICD-10-CM | POA: Diagnosis not present

## 2021-07-10 DIAGNOSIS — H43813 Vitreous degeneration, bilateral: Secondary | ICD-10-CM | POA: Diagnosis not present

## 2021-07-10 DIAGNOSIS — H353231 Exudative age-related macular degeneration, bilateral, with active choroidal neovascularization: Secondary | ICD-10-CM | POA: Diagnosis not present

## 2021-07-10 DIAGNOSIS — H43391 Other vitreous opacities, right eye: Secondary | ICD-10-CM | POA: Diagnosis not present

## 2021-07-17 DIAGNOSIS — R69 Illness, unspecified: Secondary | ICD-10-CM | POA: Diagnosis not present

## 2021-08-15 DIAGNOSIS — R69 Illness, unspecified: Secondary | ICD-10-CM | POA: Diagnosis not present

## 2021-08-17 DIAGNOSIS — E1122 Type 2 diabetes mellitus with diabetic chronic kidney disease: Secondary | ICD-10-CM | POA: Diagnosis not present

## 2021-08-17 DIAGNOSIS — N170 Acute kidney failure with tubular necrosis: Secondary | ICD-10-CM | POA: Diagnosis not present

## 2021-08-17 DIAGNOSIS — E1129 Type 2 diabetes mellitus with other diabetic kidney complication: Secondary | ICD-10-CM | POA: Diagnosis not present

## 2021-08-17 DIAGNOSIS — D638 Anemia in other chronic diseases classified elsewhere: Secondary | ICD-10-CM | POA: Diagnosis not present

## 2021-08-17 DIAGNOSIS — I160 Hypertensive urgency: Secondary | ICD-10-CM | POA: Diagnosis not present

## 2021-08-17 DIAGNOSIS — R809 Proteinuria, unspecified: Secondary | ICD-10-CM | POA: Diagnosis not present

## 2021-08-17 DIAGNOSIS — N189 Chronic kidney disease, unspecified: Secondary | ICD-10-CM | POA: Diagnosis not present

## 2021-08-24 DIAGNOSIS — R809 Proteinuria, unspecified: Secondary | ICD-10-CM | POA: Diagnosis not present

## 2021-08-24 DIAGNOSIS — N189 Chronic kidney disease, unspecified: Secondary | ICD-10-CM | POA: Diagnosis not present

## 2021-08-24 DIAGNOSIS — I129 Hypertensive chronic kidney disease with stage 1 through stage 4 chronic kidney disease, or unspecified chronic kidney disease: Secondary | ICD-10-CM | POA: Diagnosis not present

## 2021-08-24 DIAGNOSIS — E1129 Type 2 diabetes mellitus with other diabetic kidney complication: Secondary | ICD-10-CM | POA: Diagnosis not present

## 2021-08-24 DIAGNOSIS — N170 Acute kidney failure with tubular necrosis: Secondary | ICD-10-CM | POA: Diagnosis not present

## 2021-08-24 DIAGNOSIS — Z6841 Body Mass Index (BMI) 40.0 and over, adult: Secondary | ICD-10-CM | POA: Diagnosis not present

## 2021-08-24 DIAGNOSIS — D638 Anemia in other chronic diseases classified elsewhere: Secondary | ICD-10-CM | POA: Diagnosis not present

## 2021-08-24 DIAGNOSIS — E1122 Type 2 diabetes mellitus with diabetic chronic kidney disease: Secondary | ICD-10-CM | POA: Diagnosis not present

## 2021-09-05 ENCOUNTER — Ambulatory Visit (HOSPITAL_COMMUNITY): Payer: TRICARE For Life (TFL)

## 2021-09-05 ENCOUNTER — Encounter (HOSPITAL_COMMUNITY): Payer: Medicare HMO

## 2021-09-07 ENCOUNTER — Ambulatory Visit: Payer: Medicare HMO | Admitting: Nutrition

## 2021-10-03 ENCOUNTER — Encounter: Payer: Self-pay | Admitting: Nutrition

## 2021-10-03 ENCOUNTER — Encounter: Payer: Medicare HMO | Attending: Internal Medicine | Admitting: Nutrition

## 2021-10-03 VITALS — Ht 62.0 in | Wt 218.0 lb

## 2021-10-03 DIAGNOSIS — E669 Obesity, unspecified: Secondary | ICD-10-CM | POA: Insufficient documentation

## 2021-10-03 DIAGNOSIS — I10 Essential (primary) hypertension: Secondary | ICD-10-CM | POA: Insufficient documentation

## 2021-10-03 DIAGNOSIS — E1165 Type 2 diabetes mellitus with hyperglycemia: Secondary | ICD-10-CM | POA: Insufficient documentation

## 2021-10-03 DIAGNOSIS — E782 Mixed hyperlipidemia: Secondary | ICD-10-CM | POA: Insufficient documentation

## 2021-10-03 DIAGNOSIS — N183 Chronic kidney disease, stage 3 unspecified: Secondary | ICD-10-CM | POA: Diagnosis not present

## 2021-10-03 DIAGNOSIS — E118 Type 2 diabetes mellitus with unspecified complications: Secondary | ICD-10-CM | POA: Insufficient documentation

## 2021-10-03 NOTE — Progress Notes (Signed)
Medical Nutrition Therapy DM Follow up  Appointment Start time:  1120 Appointment End time: 1145 Referral diagnosis: E11.8  Preferred learning style:  no preference indicated Learning readiness:  change in progress   NUTRITION ASSESSMENT   Changes made: No longer taking benicar and janument or Jardiance. IN the process moving into a smaller house. FBS not testing anymore. Kidney's have improved. Creatine down to 1.37.  August 6.9% 06/13/21  Lost 16 lbs since last visit..   Only walking she gets is walking into the [facility] for her daily lunch meal M-Thursday. Changes made: Not many. Avoiding processed foods and trying to eat more low carb vegetables and drinking water.   Recently saw a nephrologist, Dr, Theador Hawthorne. Reduced Lisinopril. Stopped Theatre manager per nephrology.  Anthropometrics  Wt Readings from Last 3 Encounters:  05/17/21 234 lb 2.1 oz (106.2 kg)  04/05/21 225 lb (102.1 kg)  03/22/21 222 lb (100.7 kg)   Ht Readings from Last 3 Encounters:  04/05/21 5' (1.524 m)  03/22/21 $RemoveB'5\' 2"'mXocGPgD$  (1.575 m)  02/16/21 $RemoveB'5\' 3"'tAACriQD$  (1.6 m)   There is no height or weight on file to calculate BMI. $RemoveBefore'@BMIFA'GZaoFBnaECjXB$ @ Facility age limit for growth percentiles is 20 years. Facility age limit for growth percentiles is 20 years.  Glucose 65 - 99 mg/dL 177 High               Fasting reference interval   For someone without known diabetes, a glucose  value >125 mg/dL indicates that they may have  diabetes and this should be confirmed with a  follow-up test.  BUN 7 - 25 mg/dL 35 High     Creatinine 0.60 - 0.95 mg/dL 1.35 High     eGFR CKD-EPI CR 2021 > OR = 60 mL/min/1.63m2 40 Low   The eGFR is based on the CKD-EPI 2021 equation. To calculate  the new eGFR from a previous Creatinine or Cystatin C  result, go to https://www.kidney.org/professionals/  kdoqi/[email or web address]  BUN/Creatinine Ratio 6 - 22 (calc) 26 High     Sodium 135 - 146 mmol/L 138    Potassium 3.5 - 5.3 mmol/L 5.1     Chloride 98 - 110 mmol/L 105    Bicarbonate (CO2) 20 - 32 mmol/L 26    Calcium 8.6 - 10.4 mg/dL 9.7    Phosphorus 2.1 - 4.3 mg/dL 4.4 High     Albumin 3.6 - 5.1 g/dL 3.8    Resulting Agency  [facility]   Narrative Performed by [facility] FASTING:YES   Lab Results  Component Value Date   HGBA1C 6.9 06/12/2021  Lipid Panel     Component Value Date/Time   CHOL 173 06/12/2021 0000   TRIG 175 (A) 06/12/2021 0000   HDL 51 06/12/2021 0000   CHOLHDL 3 02/20/2011 1051   VLDL 27.8 02/20/2011 1051   [place] 92 06/12/2021 0000   LDLDIRECT 168.8 10/19/2008 0914     Clinical Medical Hx: HTN, Hyperlipidemia, DM Type 2, Vit D deficiency, obesity, osteoarthritis, CKD,  Cancer in right breast  Medication Metformin 500 mg once a day, Jardiance 10 mg a day.   Results for CARRINE, KROBOTH (MRN [id number]) as of 01/05/2021 16:06  Ref. Range 09/05/2020 12:26  COMPREHENSIVE METABOLIC PANEL Unknown Rpt (A)  Sodium Latest Ref Range: 135 - 145 mmol/L 135  Potassium Latest Ref Range: 3.5 - 5.1 mmol/L 5.3 (H)  Chloride Latest Ref Range: 98 - 111 mmol/L 104  CO2 Latest Ref Range: 22 - 32 mmol/L 24  Glucose  Latest Ref Range: 70 - 99 mg/dL 143 (H)  BUN Latest Ref Range: 8 - 23 mg/dL 37 (H)  Creatinine Latest Ref Range: 0.44 - 1.00 mg/dL 1.52 (H)  Calcium Latest Ref Range: 8.9 - 10.3 mg/dL 10.1  Anion gap Latest Ref Range: 5 - 15  7  Alkaline Phosphatase Latest Ref Range: 38 - 126 U/L 54  Albumin Latest Ref Range: 3.5 - 5.0 g/dL 4.1  AST Latest Ref Range: 15 - 41 U/L 15  ALT Latest Ref Range: 0 - 44 U/L 14  Total Protein Latest Ref Range: 6.5 - 8.1 g/dL 7.2  Total Bilirubin Latest Ref Range: 0.3 - 1.2 mg/dL 0.6  GFR, Estimated Latest Ref Range: >60 mL/min 35 (L)  Vitamin D, 25-Hydroxy Latest Ref Range: 30 - 100 ng/mL 30.23  WBC Latest Ref Range: 4.0 - 10.5 K/uL 6.9  RBC Latest Ref Range: 3.87 - 5.11 MIL/uL 3.66 (L)  Hemoglobin Latest Ref Range: 12.0 - 15.0 g/dL 10.8 (L)  HCT Latest Ref  Range: 36.0 - 46.0 % 34.1 (L)  MCV Latest Ref Range: 80.0 - 100.0 fL 93.2  MCH Latest Ref Range: 26.0 - 34.0 pg 29.5  MCHC Latest Ref Range: 30.0 - 36.0 g/dL 31.7  RDW Latest Ref Range: 11.5 - 15.5 % 12.4  Platelets Latest Ref Range: 150 - 400 K/uL 323  nRBC Latest Ref Range: 0.0 - 0.2 % 0.0  Neutrophils Latest Units: % 64  Lymphocytes Latest Units: % 16  Monocytes Relative Latest Units: % 10  Eosinophil Latest Units: % 9  Basophil Latest Units: % 1  Immature Granulocytes Latest Units: % 0  NEUT# Latest Ref Range: 1.7 - 7.7 K/uL 4.4  Lymphocyte # Latest Ref Range: 0.7 - 4.0 K/uL 1.1  Monocyte # Latest Ref Range: 0.1 - 1.0 K/uL 0.7  Eosinophils Absolute Latest Ref Range: 0.0 - 0.5 K/uL 0.6 (H)  Basophils Absolute Latest Ref Range: 0.0 - 0.1 K/uL 0.1  Abs Immature Granulocytes Latest Ref Range: 0.00 - 0.07 K/uL 0.02   Labs.  Notable Signs/Symptoms: fatigue, thirst, frequent urination,  Her diet is improving slowly.  Lifestyle & Dietary Hx Had treatment for r breast cancer. Has CKD. Sees Dr. Allyn Kenner PCP. Is on Jardiance and Metformin. She doesn't cook much and eats out mostly.  Estimated daily fluid intake: 24-30oz Supplements:Vit D Sleep: varies  Stress / self-care: stress of cancer Current average weekly physical activity: ADL   24-Hr Dietary Recall B) Cherrios with milk,  L  Ate at senior center water and milk D) Bologna sandwich,   Estimated Energy Needs Calories: 1200 Carbohydrate: 135g Protein: 90g Fat: 33g   NUTRITION DIAGNOSIS  NB-1.1 Food and nutrition-related knowledge deficit As related to Diabetes Type 2.  As evidenced by A1C 11.3.%  NUTRITION INTERVENTION  Nutrition education (E-1) on the following topics:  Low Potassium, and low sodium foods Importance of fresh fruits and vegetables for cancer protection, lowering blood sugars and needed weight loss. Healthy diet for cancer and weight loss  Handouts Provided Include     Low potassium  diet  Learning Style & Readiness for Change Teaching method utilized: Visual & Auditory  Demonstrated degree of understanding via: Teach Back  Barriers to learning/adherence to lifestyle change: none  Goals   Keep up the great job! Increased plant based foods Drink only water   Goals Avoid processed foods Increase fresh fruits and vegetables. [place] back to exercises at the senior center   [place], Plant Predominant  Nutrition is highly recommended: Eat Plenty of vegetables, Mushrooms, fruits, Legumes, Whole Grains, Nuts, seeds in lieu of processed meats, processed snacks/pastries red meat, poultry, eggs.    -It is better to avoid simple carbohydrates including: Cakes, Sweet Desserts, Ice Cream, Soda (diet and regular), Sweet Tea, Candies, Chips, Cookies, Store Bought Juices, Alcohol in Excess of  1-2 drinks a day, Lemonade,  Artificial Sweeteners, Doughnuts, Coffee Creamers, "Sugar-free" Products, etc, etc.  This is not a complete list.....  Exercise: If you are able: 30 -60 minutes a day ,4 days a week, or 150 minutes a week.  The longer the better.  Combine stretch, strength, and aerobic activities.  If you were told in the past that you have high risk for cardiovascular diseases, you may seek evaluation by your heart doctor prior to initiating moderate to intense exercise programs.  MONITORING & EVALUATION Dietary intake, weekly physical activity, and  Blood sugars in 1 month.  Next Steps  Patient is to work on improving what she is eating..  Recommend a referral to Endocrinology to get better control of her blood sugars.

## 2021-10-03 NOTE — Patient Instructions (Signed)
   Goals Avoid processed foods Increase fresh fruits and vegetables. [place] back to exercises at the senior center   Lifestyle Medicine - [employer], Plant Predominant Nutrition is highly recommended: Eat Plenty of vegetables, Mushrooms, fruits, Legumes, Whole Grains, Nuts, seeds in lieu of processed meats, processed snacks/pastries red meat, poultry, eggs.    -It is better to avoid simple carbohydrates including: Cakes, Sweet Desserts, Ice Cream, Soda (diet and regular), Sweet Tea, Candies, Chips, Cookies, Store Bought Juices, Alcohol in Excess of  1-2 drinks a day, Lemonade,  Artificial Sweeteners, Doughnuts, Coffee Creamers, "Sugar-free" Products, etc, etc.  This is not a complete list.....  Exercise: If you are able: 30 -60 minutes a day ,4 days a week, or 150 minutes a week.  The longer the better.  Combine stretch, strength, and aerobic activities.  If you were told in the past that you have high risk for cardiovascular diseases, you may seek evaluation by your heart doctor prior to initiating moderate to intense exercise programs.

## 2021-10-05 DIAGNOSIS — H35033 Hypertensive retinopathy, bilateral: Secondary | ICD-10-CM | POA: Diagnosis not present

## 2021-10-05 DIAGNOSIS — H43391 Other vitreous opacities, right eye: Secondary | ICD-10-CM | POA: Diagnosis not present

## 2021-10-05 DIAGNOSIS — H43813 Vitreous degeneration, bilateral: Secondary | ICD-10-CM | POA: Diagnosis not present

## 2021-10-05 DIAGNOSIS — H353231 Exudative age-related macular degeneration, bilateral, with active choroidal neovascularization: Secondary | ICD-10-CM | POA: Diagnosis not present

## 2021-10-13 DIAGNOSIS — D509 Iron deficiency anemia, unspecified: Secondary | ICD-10-CM | POA: Diagnosis not present

## 2021-10-13 DIAGNOSIS — E1121 Type 2 diabetes mellitus with diabetic nephropathy: Secondary | ICD-10-CM | POA: Diagnosis not present

## 2021-10-13 DIAGNOSIS — E782 Mixed hyperlipidemia: Secondary | ICD-10-CM | POA: Diagnosis not present

## 2021-10-17 ENCOUNTER — Encounter (HOSPITAL_COMMUNITY): Payer: Medicare HMO

## 2021-10-17 ENCOUNTER — Ambulatory Visit (HOSPITAL_COMMUNITY): Payer: Medicare HMO

## 2021-10-17 DIAGNOSIS — L97909 Non-pressure chronic ulcer of unspecified part of unspecified lower leg with unspecified severity: Secondary | ICD-10-CM | POA: Diagnosis not present

## 2021-10-17 DIAGNOSIS — H3530 Unspecified macular degeneration: Secondary | ICD-10-CM | POA: Diagnosis not present

## 2021-10-17 DIAGNOSIS — E782 Mixed hyperlipidemia: Secondary | ICD-10-CM | POA: Diagnosis not present

## 2021-10-17 DIAGNOSIS — Z23 Encounter for immunization: Secondary | ICD-10-CM | POA: Diagnosis not present

## 2021-10-17 DIAGNOSIS — N184 Chronic kidney disease, stage 4 (severe): Secondary | ICD-10-CM | POA: Diagnosis not present

## 2021-10-17 DIAGNOSIS — R809 Proteinuria, unspecified: Secondary | ICD-10-CM | POA: Diagnosis not present

## 2021-10-17 DIAGNOSIS — I10 Essential (primary) hypertension: Secondary | ICD-10-CM | POA: Diagnosis not present

## 2021-10-17 DIAGNOSIS — E1121 Type 2 diabetes mellitus with diabetic nephropathy: Secondary | ICD-10-CM | POA: Diagnosis not present

## 2021-10-17 DIAGNOSIS — E875 Hyperkalemia: Secondary | ICD-10-CM | POA: Diagnosis not present

## 2021-10-19 ENCOUNTER — Encounter: Payer: Self-pay | Admitting: Nutrition

## 2021-10-26 DIAGNOSIS — E559 Vitamin D deficiency, unspecified: Secondary | ICD-10-CM | POA: Diagnosis not present

## 2021-10-26 DIAGNOSIS — I129 Hypertensive chronic kidney disease with stage 1 through stage 4 chronic kidney disease, or unspecified chronic kidney disease: Secondary | ICD-10-CM | POA: Diagnosis not present

## 2021-10-26 DIAGNOSIS — N170 Acute kidney failure with tubular necrosis: Secondary | ICD-10-CM | POA: Diagnosis not present

## 2021-10-26 DIAGNOSIS — R809 Proteinuria, unspecified: Secondary | ICD-10-CM | POA: Diagnosis not present

## 2021-10-26 DIAGNOSIS — D638 Anemia in other chronic diseases classified elsewhere: Secondary | ICD-10-CM | POA: Diagnosis not present

## 2021-10-26 DIAGNOSIS — E1122 Type 2 diabetes mellitus with diabetic chronic kidney disease: Secondary | ICD-10-CM | POA: Diagnosis not present

## 2021-10-26 DIAGNOSIS — N189 Chronic kidney disease, unspecified: Secondary | ICD-10-CM | POA: Diagnosis not present

## 2021-10-26 DIAGNOSIS — Z6841 Body Mass Index (BMI) 40.0 and over, adult: Secondary | ICD-10-CM | POA: Diagnosis not present

## 2021-10-26 DIAGNOSIS — E1129 Type 2 diabetes mellitus with other diabetic kidney complication: Secondary | ICD-10-CM | POA: Diagnosis not present

## 2021-10-29 DIAGNOSIS — Z7984 Long term (current) use of oral hypoglycemic drugs: Secondary | ICD-10-CM | POA: Insufficient documentation

## 2021-10-29 DIAGNOSIS — H3530 Unspecified macular degeneration: Secondary | ICD-10-CM | POA: Insufficient documentation

## 2021-11-02 ENCOUNTER — Ambulatory Visit (HOSPITAL_COMMUNITY)
Admission: RE | Admit: 2021-11-02 | Discharge: 2021-11-02 | Disposition: A | Discharge status: Home / Self Care | Payer: Medicare HMO | Source: Ambulatory Visit | Attending: Hematology | Admitting: Hematology

## 2021-11-02 ENCOUNTER — Other Ambulatory Visit: Payer: Self-pay

## 2021-11-02 DIAGNOSIS — C50311 Malignant neoplasm of lower-inner quadrant of right female breast: Secondary | ICD-10-CM | POA: Insufficient documentation

## 2021-11-02 DIAGNOSIS — C50911 Malignant neoplasm of unspecified site of right female breast: Secondary | ICD-10-CM | POA: Diagnosis not present

## 2021-11-02 DIAGNOSIS — R922 Inconclusive mammogram: Secondary | ICD-10-CM | POA: Diagnosis not present

## 2021-11-02 IMAGING — MG DIGITAL DIAGNOSTIC BILAT W/ TOMO W/ CAD
6 of 12 series · 6 of 36 positions shown · non-contrast
Comparison: Previous exam(s).

CLINICAL DATA: History grade 2 invasive lobular carcinoma, marked
with an X clip, and diagnosed in [DATE]. biopsy of asymmetry in
the UPPER-OUTER RIGHT breast demonstrated atypical lobular
hyperplasia and excision was recommended. Patient declined surgery.
She takes anastrozole.

EXAM:
DIGITAL DIAGNOSTIC BILATERAL MAMMOGRAM WITH TOMOSYNTHESIS AND CAD
TECHNIQUE: Bilateral digital diagnostic mammography and breast tomosynthesis
was performed. The images were evaluated with computer-aided
detection.

[L CC synth-2D (1 of 2)]
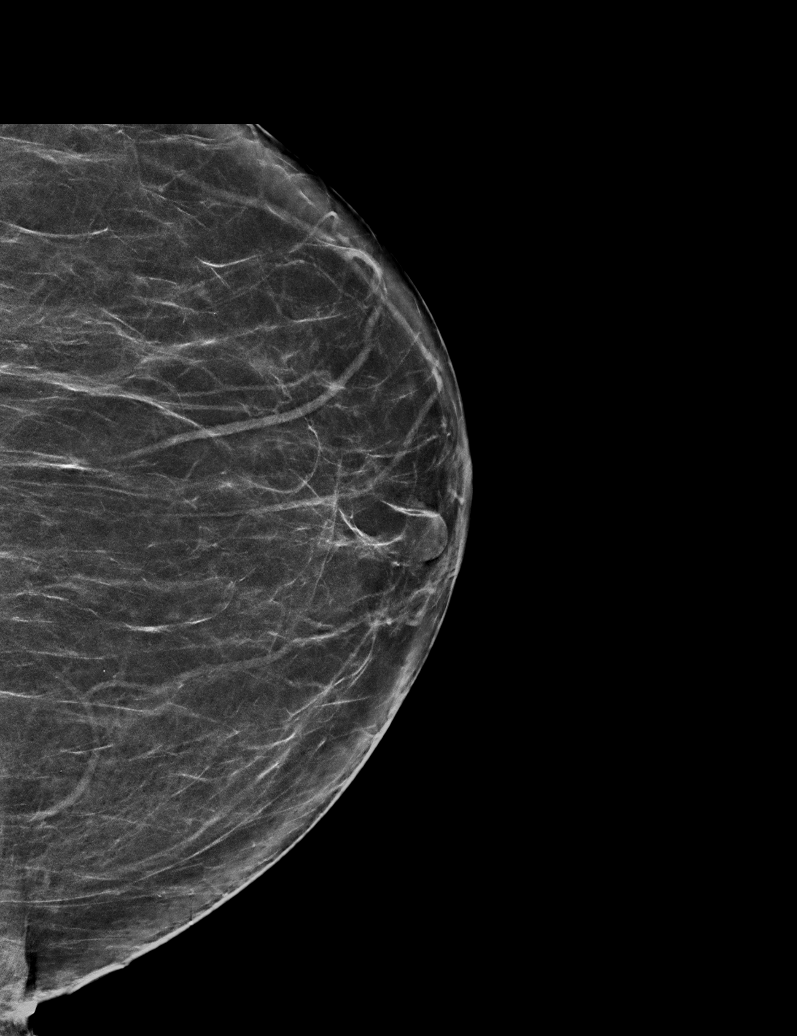

[L MLO synth-2D]
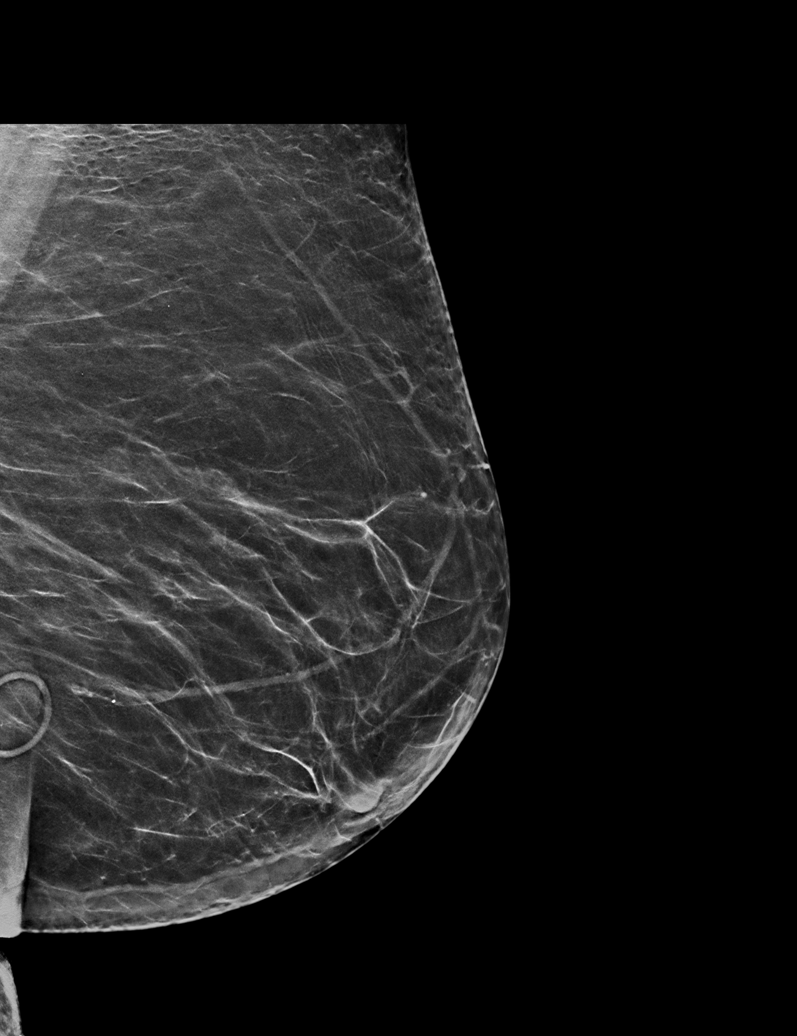

[L CC synth-2D (2 of 2)]
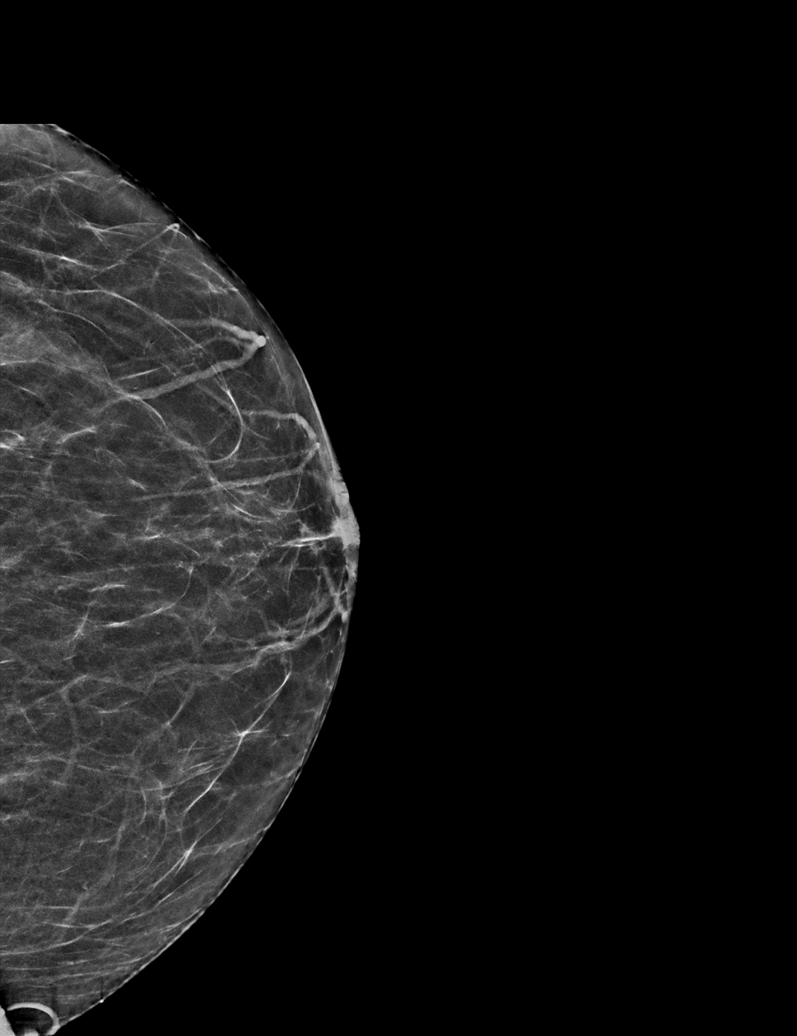

[R CC synth-2D]
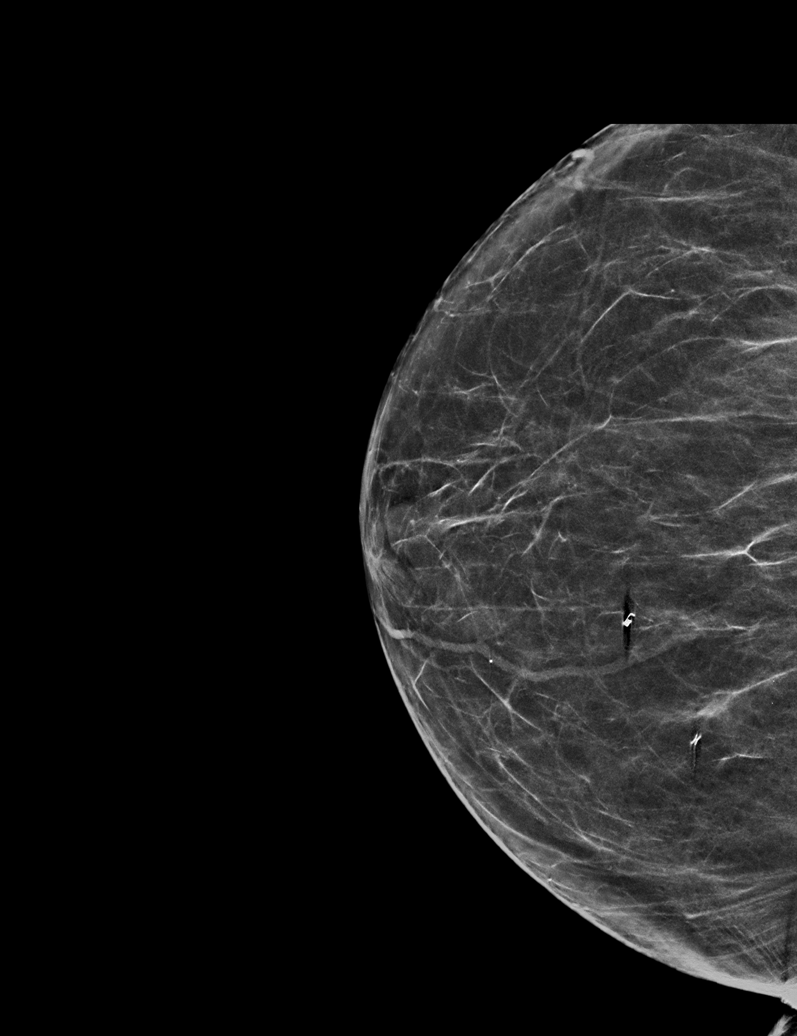

[R MLO synth-2D (1 of 2)]
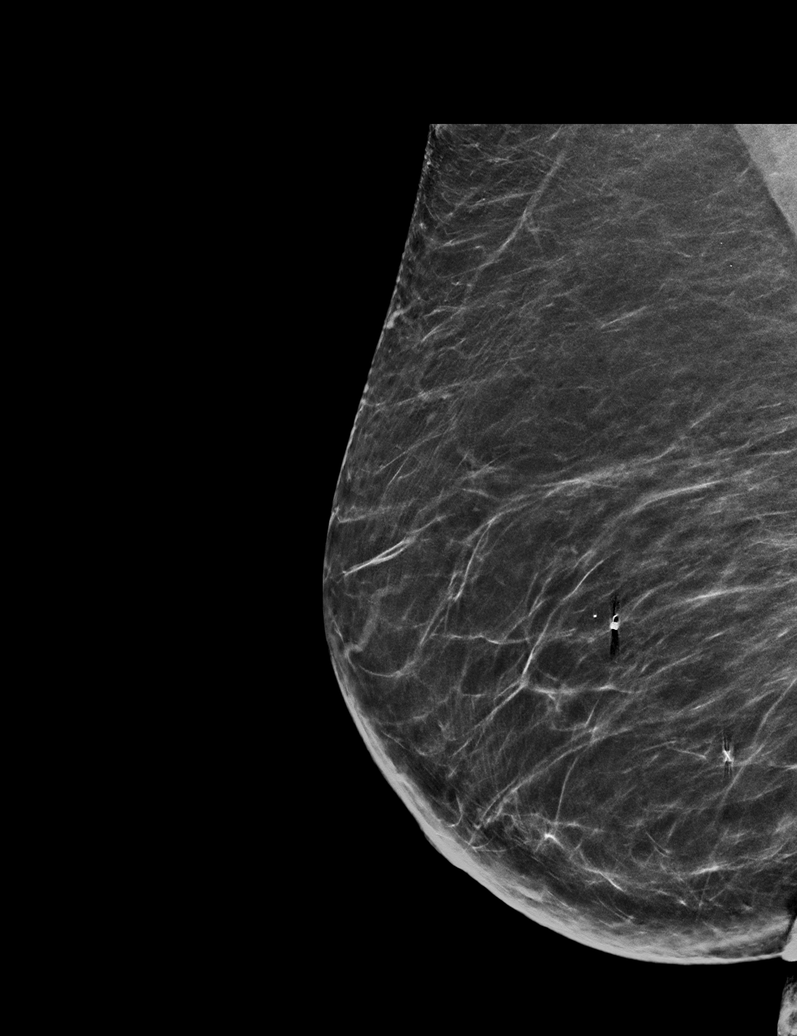

[R MLO synth-2D (2 of 2)]
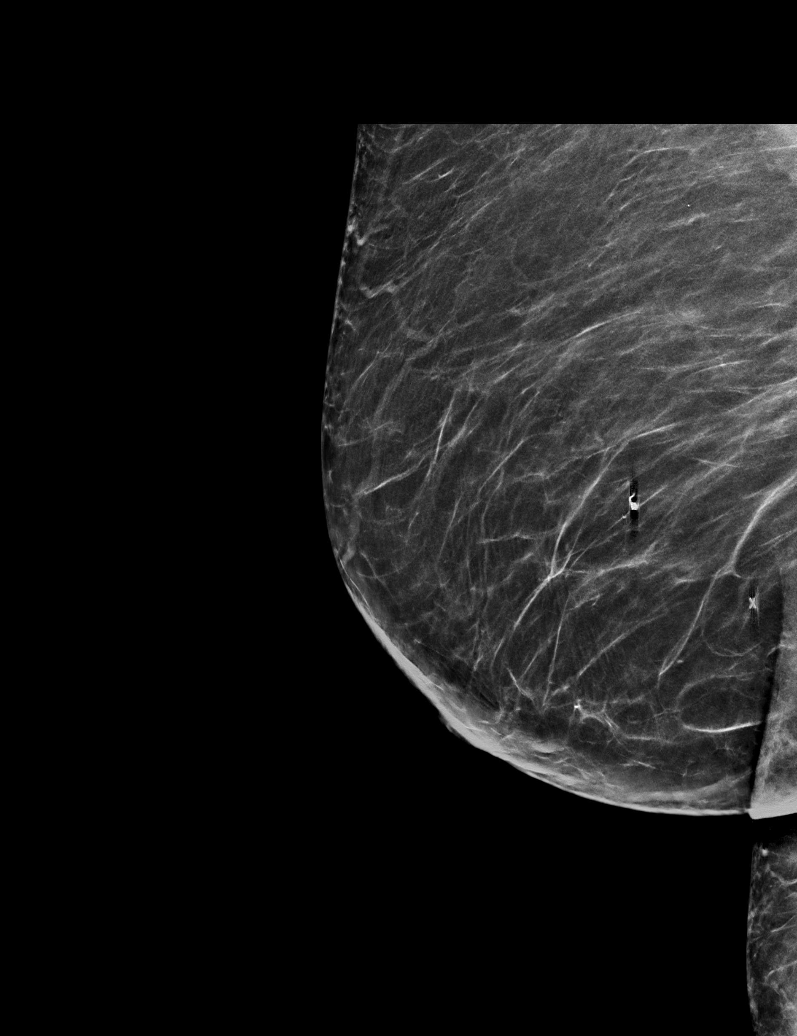

[6 of 36 positions shown; findings below may reference images not displayed]

ACR Breast Density Category b: There are scattered areas of
fibroglandular density.
FINDINGS: Within the LOWER INNER QUADRANT of the RIGHT breast, there is vague
density adjacent to the/X shaped clip in measuring approximately 5
millimeters. Asymmetry appears less masslike compared with multiple
prior studies. A coil shaped clip is identified in the UPPER INNER
QUADRANT of the RIGHT breast, unchanged. No new or suspicious
findings in either breast.
IMPRESSION: Smaller known malignancy in the LOWER INNER QUADRANT of RIGHT
breast. No new findings.

RECOMMENDATION:
Treatment plan for known malignancy. Recommend annual diagnostic
mammogram.

I have discussed the findings and recommendations with the patient.
If applicable, a reminder letter will be sent to the patient
regarding the next appointment.

BI-RADS CATEGORY  6: Known biopsy-proven malignancy.

## 2021-11-03 DIAGNOSIS — N189 Chronic kidney disease, unspecified: Secondary | ICD-10-CM | POA: Diagnosis not present

## 2021-11-03 DIAGNOSIS — E1122 Type 2 diabetes mellitus with diabetic chronic kidney disease: Secondary | ICD-10-CM | POA: Diagnosis not present

## 2021-11-03 DIAGNOSIS — R809 Proteinuria, unspecified: Secondary | ICD-10-CM | POA: Diagnosis not present

## 2021-11-03 DIAGNOSIS — D638 Anemia in other chronic diseases classified elsewhere: Secondary | ICD-10-CM | POA: Diagnosis not present

## 2021-11-03 DIAGNOSIS — Z6841 Body Mass Index (BMI) 40.0 and over, adult: Secondary | ICD-10-CM | POA: Diagnosis not present

## 2021-11-03 DIAGNOSIS — E1129 Type 2 diabetes mellitus with other diabetic kidney complication: Secondary | ICD-10-CM | POA: Diagnosis not present

## 2021-11-03 DIAGNOSIS — I129 Hypertensive chronic kidney disease with stage 1 through stage 4 chronic kidney disease, or unspecified chronic kidney disease: Secondary | ICD-10-CM | POA: Diagnosis not present

## 2021-11-03 DIAGNOSIS — E559 Vitamin D deficiency, unspecified: Secondary | ICD-10-CM | POA: Diagnosis not present

## 2021-11-13 ENCOUNTER — Emergency Department (HOSPITAL_COMMUNITY): Payer: Medicare HMO

## 2021-11-13 ENCOUNTER — Inpatient Hospital Stay (HOSPITAL_COMMUNITY)
Admission: EM | Admit: 2021-11-13 | Discharge: 2021-11-15 | DRG: 557 | Disposition: A | Discharge status: Skilled Nursing Facility | Payer: Medicare HMO | Attending: Family Medicine | Admitting: Family Medicine

## 2021-11-13 ENCOUNTER — Other Ambulatory Visit: Payer: Self-pay

## 2021-11-13 ENCOUNTER — Encounter (HOSPITAL_COMMUNITY): Payer: Self-pay | Admitting: *Deleted

## 2021-11-13 DIAGNOSIS — N390 Urinary tract infection, site not specified: Secondary | ICD-10-CM | POA: Diagnosis not present

## 2021-11-13 DIAGNOSIS — L89893 Pressure ulcer of other site, stage 3: Secondary | ICD-10-CM | POA: Diagnosis not present

## 2021-11-13 DIAGNOSIS — R410 Disorientation, unspecified: Secondary | ICD-10-CM | POA: Diagnosis not present

## 2021-11-13 DIAGNOSIS — I10 Essential (primary) hypertension: Secondary | ICD-10-CM | POA: Diagnosis not present

## 2021-11-13 DIAGNOSIS — E11628 Type 2 diabetes mellitus with other skin complications: Secondary | ICD-10-CM | POA: Diagnosis not present

## 2021-11-13 DIAGNOSIS — M7989 Other specified soft tissue disorders: Secondary | ICD-10-CM | POA: Diagnosis not present

## 2021-11-13 DIAGNOSIS — N189 Chronic kidney disease, unspecified: Secondary | ICD-10-CM

## 2021-11-13 DIAGNOSIS — E1122 Type 2 diabetes mellitus with diabetic chronic kidney disease: Secondary | ICD-10-CM | POA: Diagnosis present

## 2021-11-13 DIAGNOSIS — E119 Type 2 diabetes mellitus without complications: Secondary | ICD-10-CM | POA: Diagnosis not present

## 2021-11-13 DIAGNOSIS — E860 Dehydration: Secondary | ICD-10-CM | POA: Diagnosis not present

## 2021-11-13 DIAGNOSIS — R778 Other specified abnormalities of plasma proteins: Secondary | ICD-10-CM | POA: Diagnosis not present

## 2021-11-13 DIAGNOSIS — Z7984 Long term (current) use of oral hypoglycemic drugs: Secondary | ICD-10-CM

## 2021-11-13 DIAGNOSIS — L089 Local infection of the skin and subcutaneous tissue, unspecified: Secondary | ICD-10-CM

## 2021-11-13 DIAGNOSIS — Z833 Family history of diabetes mellitus: Secondary | ICD-10-CM | POA: Diagnosis not present

## 2021-11-13 DIAGNOSIS — Z79811 Long term (current) use of aromatase inhibitors: Secondary | ICD-10-CM | POA: Diagnosis not present

## 2021-11-13 DIAGNOSIS — R7989 Other specified abnormal findings of blood chemistry: Secondary | ICD-10-CM

## 2021-11-13 DIAGNOSIS — R000 Tachycardia, unspecified: Secondary | ICD-10-CM | POA: Diagnosis not present

## 2021-11-13 DIAGNOSIS — Z6839 Body mass index (BMI) 39.0-39.9, adult: Secondary | ICD-10-CM

## 2021-11-13 DIAGNOSIS — M6282 Rhabdomyolysis: Secondary | ICD-10-CM | POA: Diagnosis not present

## 2021-11-13 DIAGNOSIS — U071 COVID-19: Secondary | ICD-10-CM

## 2021-11-13 DIAGNOSIS — E785 Hyperlipidemia, unspecified: Secondary | ICD-10-CM

## 2021-11-13 DIAGNOSIS — L03115 Cellulitis of right lower limb: Secondary | ICD-10-CM | POA: Diagnosis present

## 2021-11-13 DIAGNOSIS — R739 Hyperglycemia, unspecified: Secondary | ICD-10-CM | POA: Diagnosis not present

## 2021-11-13 DIAGNOSIS — L89322 Pressure ulcer of left buttock, stage 2: Secondary | ICD-10-CM | POA: Diagnosis present

## 2021-11-13 DIAGNOSIS — R0902 Hypoxemia: Secondary | ICD-10-CM | POA: Diagnosis not present

## 2021-11-13 DIAGNOSIS — G9341 Metabolic encephalopathy: Secondary | ICD-10-CM | POA: Diagnosis not present

## 2021-11-13 DIAGNOSIS — N183 Chronic kidney disease, stage 3 unspecified: Secondary | ICD-10-CM

## 2021-11-13 DIAGNOSIS — L89156 Pressure-induced deep tissue damage of sacral region: Secondary | ICD-10-CM | POA: Diagnosis not present

## 2021-11-13 DIAGNOSIS — D631 Anemia in chronic kidney disease: Secondary | ICD-10-CM | POA: Diagnosis present

## 2021-11-13 DIAGNOSIS — M25474 Effusion, right foot: Secondary | ICD-10-CM | POA: Diagnosis not present

## 2021-11-13 DIAGNOSIS — Z79899 Other long term (current) drug therapy: Secondary | ICD-10-CM

## 2021-11-13 DIAGNOSIS — R4182 Altered mental status, unspecified: Secondary | ICD-10-CM

## 2021-11-13 DIAGNOSIS — N1832 Chronic kidney disease, stage 3b: Secondary | ICD-10-CM | POA: Diagnosis not present

## 2021-11-13 DIAGNOSIS — I129 Hypertensive chronic kidney disease with stage 1 through stage 4 chronic kidney disease, or unspecified chronic kidney disease: Secondary | ICD-10-CM | POA: Diagnosis present

## 2021-11-13 DIAGNOSIS — S91001A Unspecified open wound, right ankle, initial encounter: Secondary | ICD-10-CM | POA: Diagnosis not present

## 2021-11-13 DIAGNOSIS — Z7952 Long term (current) use of systemic steroids: Secondary | ICD-10-CM

## 2021-11-13 DIAGNOSIS — L8990 Pressure ulcer of unspecified site, unspecified stage: Secondary | ICD-10-CM | POA: Insufficient documentation

## 2021-11-13 DIAGNOSIS — E872 Acidosis, unspecified: Secondary | ICD-10-CM | POA: Diagnosis not present

## 2021-11-13 DIAGNOSIS — E1165 Type 2 diabetes mellitus with hyperglycemia: Secondary | ICD-10-CM | POA: Diagnosis not present

## 2021-11-13 DIAGNOSIS — M1990 Unspecified osteoarthritis, unspecified site: Secondary | ICD-10-CM | POA: Diagnosis present

## 2021-11-13 DIAGNOSIS — E1121 Type 2 diabetes mellitus with diabetic nephropathy: Secondary | ICD-10-CM

## 2021-11-13 DIAGNOSIS — E782 Mixed hyperlipidemia: Secondary | ICD-10-CM | POA: Diagnosis present

## 2021-11-13 DIAGNOSIS — R748 Abnormal levels of other serum enzymes: Secondary | ICD-10-CM

## 2021-11-13 DIAGNOSIS — I248 Other forms of acute ischemic heart disease: Secondary | ICD-10-CM | POA: Diagnosis not present

## 2021-11-13 DIAGNOSIS — R55 Syncope and collapse: Secondary | ICD-10-CM | POA: Diagnosis not present

## 2021-11-13 DIAGNOSIS — L97519 Non-pressure chronic ulcer of other part of right foot with unspecified severity: Secondary | ICD-10-CM | POA: Diagnosis not present

## 2021-11-13 DIAGNOSIS — Z743 Need for continuous supervision: Secondary | ICD-10-CM | POA: Diagnosis not present

## 2021-11-13 DIAGNOSIS — M19071 Primary osteoarthritis, right ankle and foot: Secondary | ICD-10-CM | POA: Diagnosis not present

## 2021-11-13 HISTORY — DX: Type 2 diabetes mellitus without complications: E11.9

## 2021-11-13 LAB — COMPREHENSIVE METABOLIC PANEL
ALT: 38 U/L (ref 0–44)
AST: 69 U/L — ABNORMAL HIGH (ref 15–41)
Albumin: 3.5 g/dL (ref 3.5–5.0)
Alkaline Phosphatase: 74 U/L (ref 38–126)
Anion gap: 14 (ref 5–15)
BUN: 49 mg/dL — ABNORMAL HIGH (ref 8–23)
CO2: 21 mmol/L — ABNORMAL LOW (ref 22–32)
Calcium: 9.3 mg/dL (ref 8.9–10.3)
Chloride: 101 mmol/L (ref 98–111)
Creatinine, Ser: 1.72 mg/dL — ABNORMAL HIGH (ref 0.44–1.00)
GFR, Estimated: 30 mL/min — ABNORMAL LOW (ref >60)
Glucose, Bld: 331 mg/dL — ABNORMAL HIGH (ref 70–99)
Potassium: 3.7 mmol/L (ref 3.5–5.1)
Sodium: 136 mmol/L (ref 135–145)
Total Bilirubin: 0.7 mg/dL (ref 0.3–1.2)
Total Protein: 7.4 g/dL (ref 6.5–8.1)

## 2021-11-13 LAB — URINALYSIS, ROUTINE W REFLEX MICROSCOPIC
Bilirubin Urine: NEGATIVE
Glucose, UA: 250 mg/dL — AB
Ketones, ur: NEGATIVE mg/dL
Nitrite: NEGATIVE
Protein, ur: 100 mg/dL — AB
Specific Gravity, Urine: 1.025 (ref 1.005–1.030)
pH: 6.5 (ref 5.0–8.0)

## 2021-11-13 LAB — CBC WITH DIFFERENTIAL/PLATELET
Abs Immature Granulocytes: 0.04 10*3/uL (ref 0.00–0.07)
Basophils Absolute: 0 10*3/uL (ref 0.0–0.1)
Basophils Relative: 0 %
Eosinophils Absolute: 0 10*3/uL (ref 0.0–0.5)
Eosinophils Relative: 0 %
HCT: 44.4 % (ref 36.0–46.0)
Hemoglobin: 14 g/dL (ref 12.0–15.0)
Immature Granulocytes: 0 %
Lymphocytes Relative: 4 %
Lymphs Abs: 0.4 10*3/uL — ABNORMAL LOW (ref 0.7–4.0)
MCH: 28.2 pg (ref 26.0–34.0)
MCHC: 31.5 g/dL (ref 30.0–36.0)
MCV: 89.3 fL (ref 80.0–100.0)
Monocytes Absolute: 0.8 10*3/uL (ref 0.1–1.0)
Monocytes Relative: 7 %
Neutro Abs: 9.6 10*3/uL — ABNORMAL HIGH (ref 1.7–7.7)
Neutrophils Relative %: 89 %
Platelets: 362 10*3/uL (ref 150–400)
RBC: 4.97 MIL/uL (ref 3.87–5.11)
RDW: 13.3 % (ref 11.5–15.5)
WBC: 10.9 10*3/uL — ABNORMAL HIGH (ref 4.0–10.5)
nRBC: 0 % (ref 0.0–0.2)

## 2021-11-13 LAB — MAGNESIUM: Magnesium: 1.9 mg/dL (ref 1.7–2.4)

## 2021-11-13 LAB — URINALYSIS, MICROSCOPIC (REFLEX): WBC, UA: >50 WBC/hpf (ref 0–5)

## 2021-11-13 LAB — LACTIC ACID, PLASMA
Lactic Acid, Venous: 2.3 mmol/L (ref 0.5–1.9)
Lactic Acid, Venous: 1.5 mmol/L (ref 0.5–1.9)

## 2021-11-13 LAB — TROPONIN I (HIGH SENSITIVITY)
Troponin I (High Sensitivity): 515 ng/L (ref <18)
Troponin I (High Sensitivity): 484 ng/L (ref <18)

## 2021-11-13 LAB — PROTIME-INR
INR: 1.1 (ref 0.8–1.2)
Prothrombin Time: 13.7 seconds (ref 11.4–15.2)

## 2021-11-13 LAB — RESP PANEL BY RT-PCR (FLU A&B, COVID) ARPGX2
Influenza A by PCR: NEGATIVE
Influenza B by PCR: NEGATIVE
SARS Coronavirus 2 by RT PCR: POSITIVE — AB

## 2021-11-13 LAB — CK: Total CK: 2534 U/L — ABNORMAL HIGH (ref 38–234)

## 2021-11-13 LAB — GLUCOSE, CAPILLARY: Glucose-Capillary: 298 mg/dL — ABNORMAL HIGH (ref 70–99)

## 2021-11-13 LAB — CBG MONITORING, ED: Glucose-Capillary: 312 mg/dL — ABNORMAL HIGH (ref 70–99)

## 2021-11-13 IMAGING — DX DG FOOT 2V*R*
2 series · 2 of 2 positions shown · non-contrast
Comparison: None.

CLINICAL DATA: Patient found down with lesion on the right foot.

EXAM:
RIGHT FOOT - 2 VIEW

[foot ap]
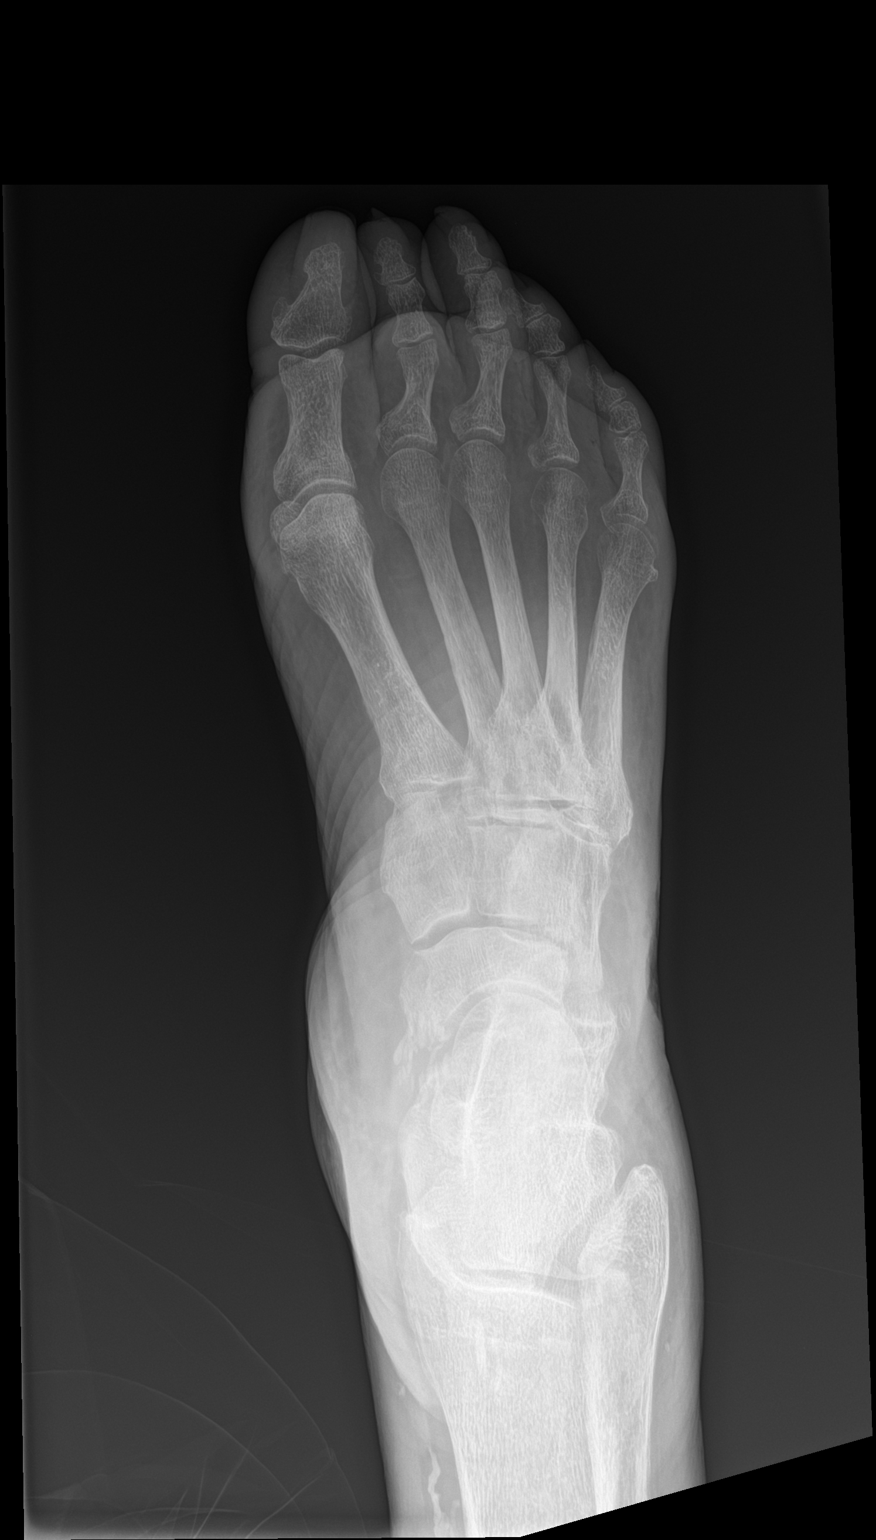

[foot lat]
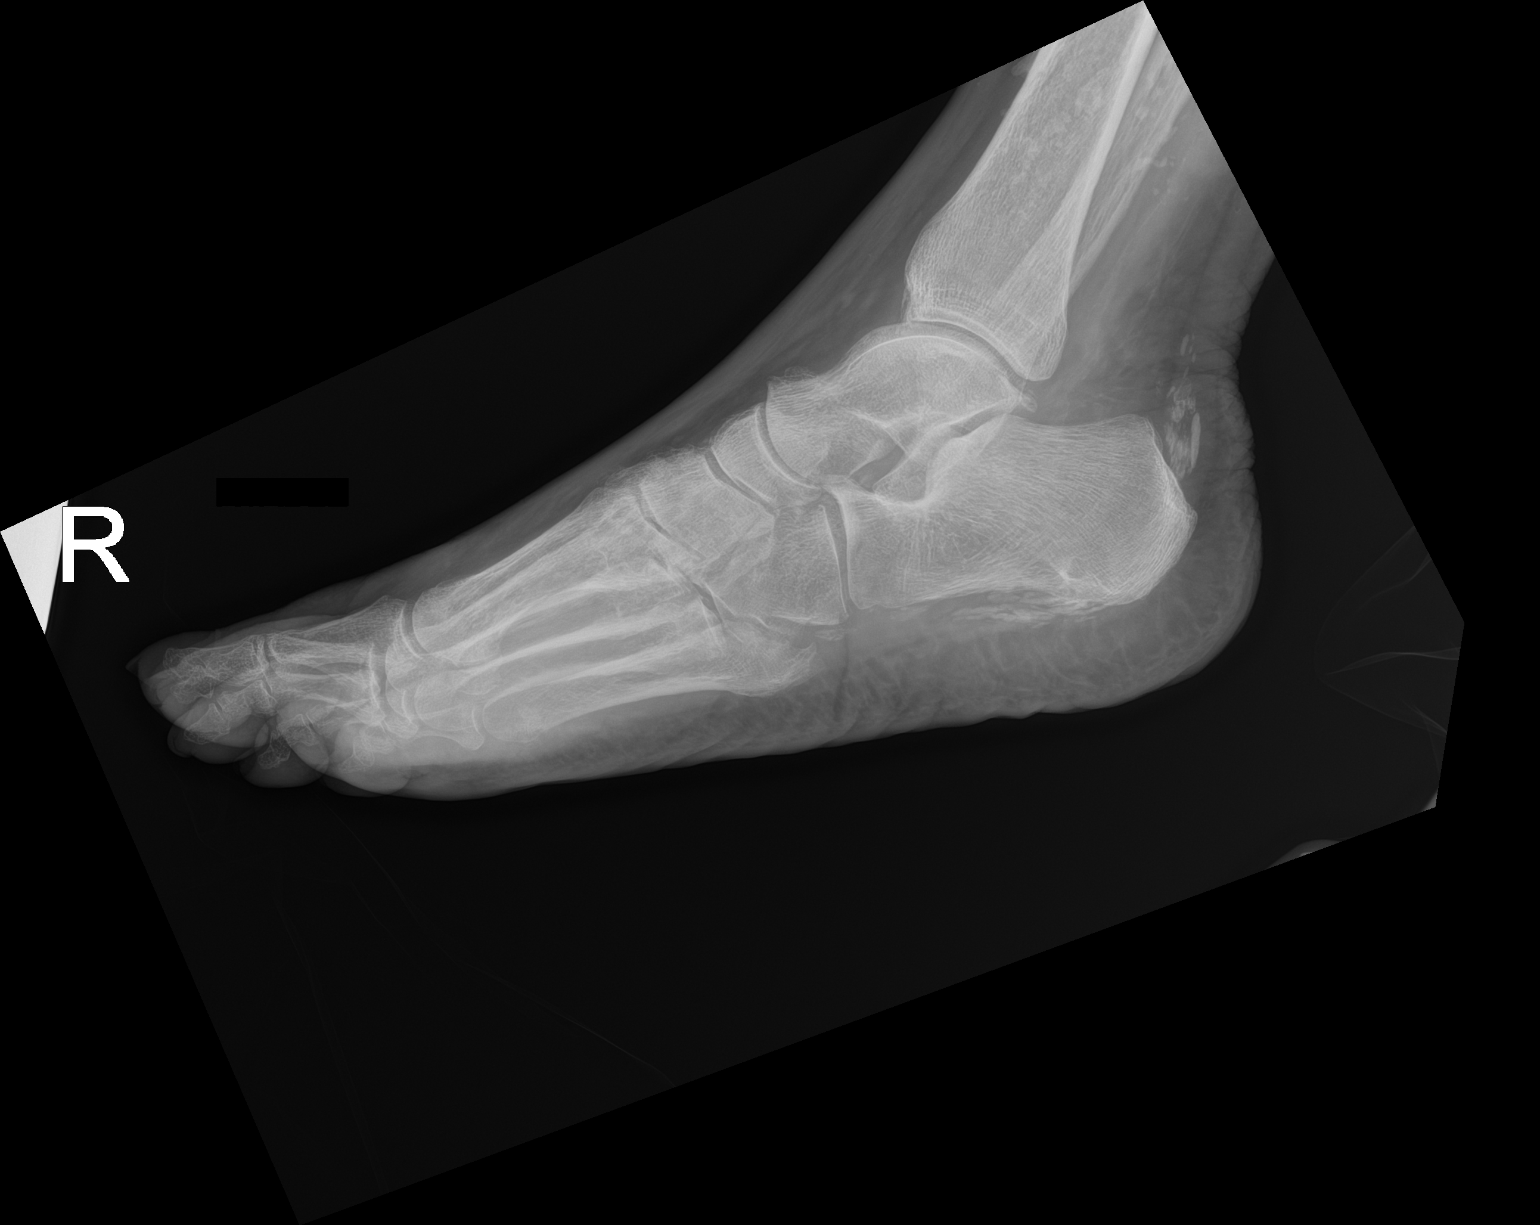

[2 of 2 positions shown; findings below may reference images not displayed]

FINDINGS: There is no evidence of an acute fracture or dislocation. Moderate
severity degenerative changes are seen along the dorsal aspect of
the mid right foot. Chronic appearing linear soft tissue
calcifications are noted adjacent to the plantar aspect of the right
calcaneus and along the insertion of the right Achilles tendon.
IMPRESSION: No acute fracture or dislocation.

## 2021-11-13 IMAGING — CT CT CERVICAL SPINE W/O CM
3 of 4 series · 13 of 35 positions shown, 16 images · non-contrast
Comparison: None.

CLINICAL DATA: Mental status changes. Found down at home in the
bathroom.



[Series 5: sagittal bone · sagittal · 0.29mm/px · 5 of 61 slices shown, 6 images]
[im 21/61  bone]
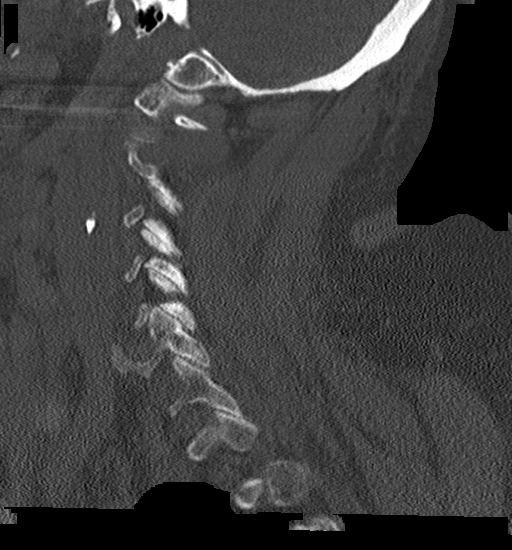
[im 26/61  bone]
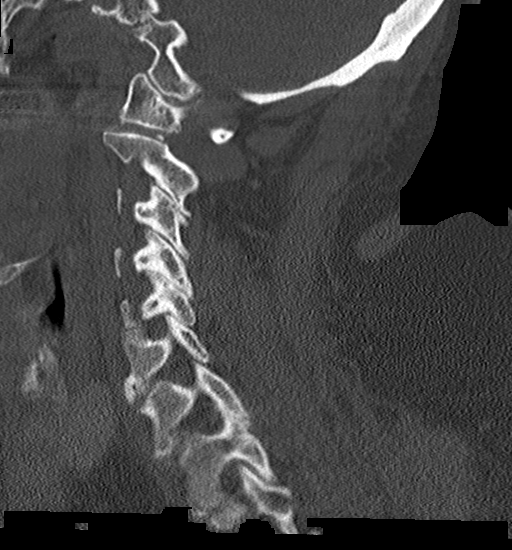
[im 31/61  soft-tissue]
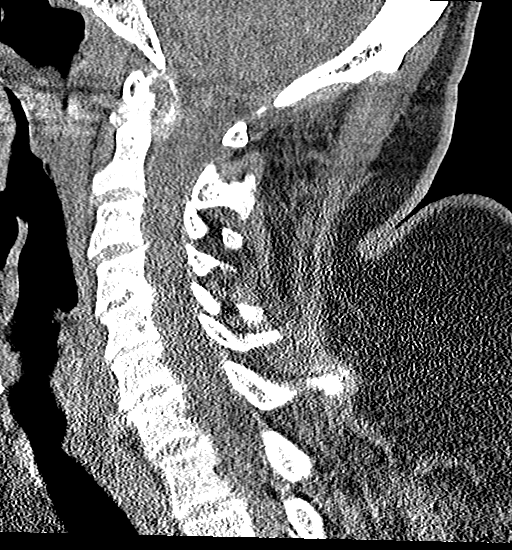
[im 31/61  bone]
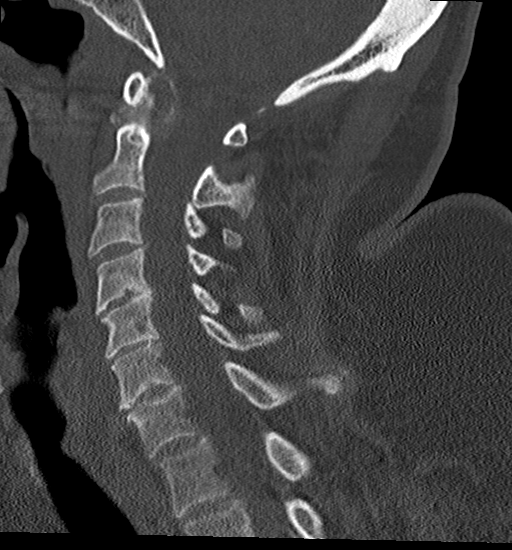
[im 36/61  bone]
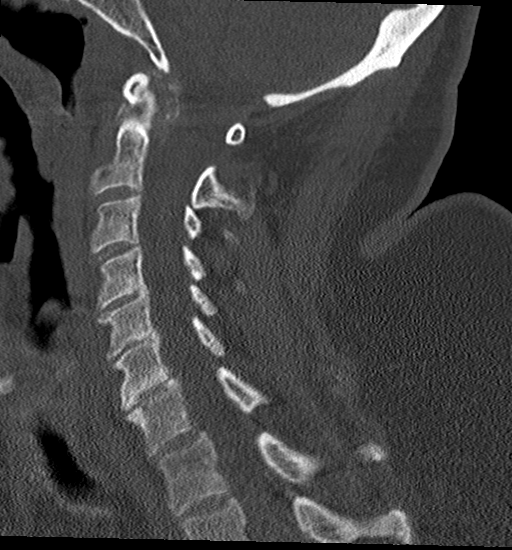
[im 41/61  bone]
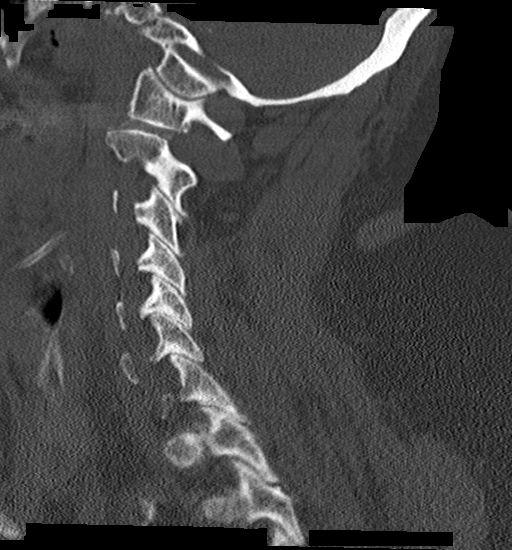

[Series 6: coronal bone · coronal · 0.23mm/px · 3 of 61 slices shown]
[im 13/61  bone]
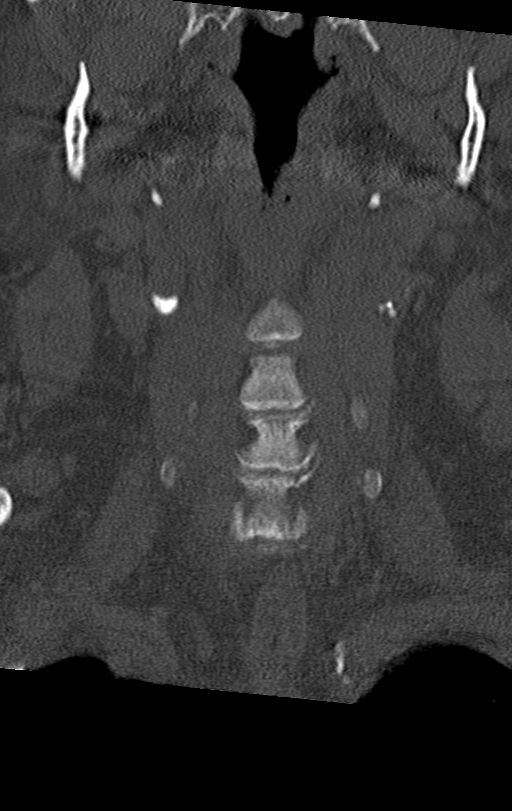
[im 25/61  bone]
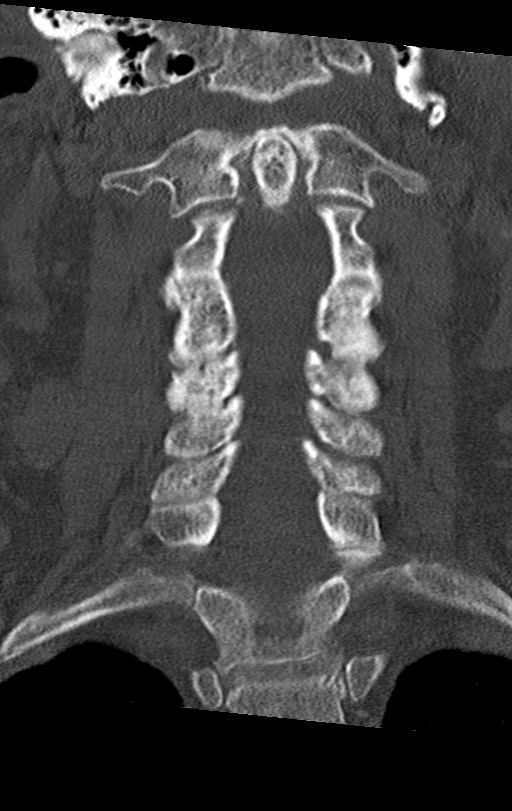
[im 37/61  bone]
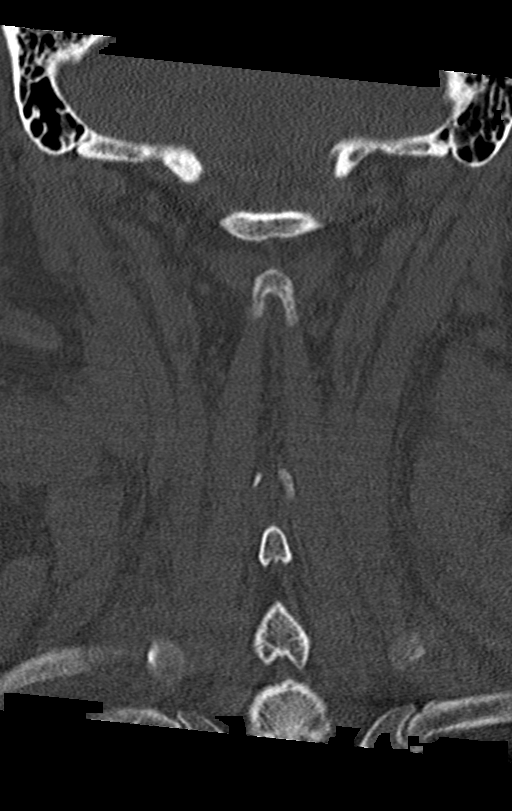

[Series 7: orthogonal axials · axial · 0.21mm/px · z∈[+1053,+1165]mm · 5 of 83 slices shown, 7 images]
[im 14/83  soft-tissue]
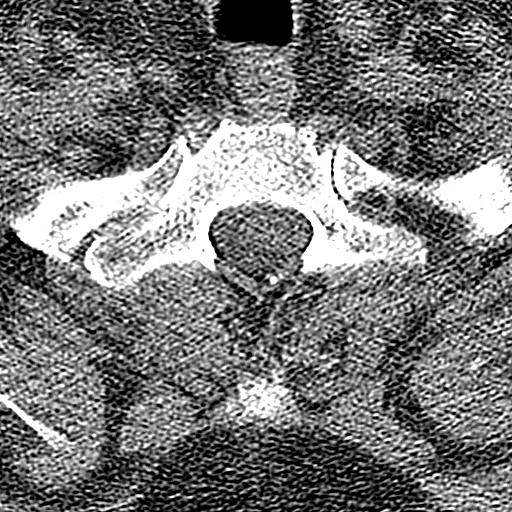
[im 14/83  bone]
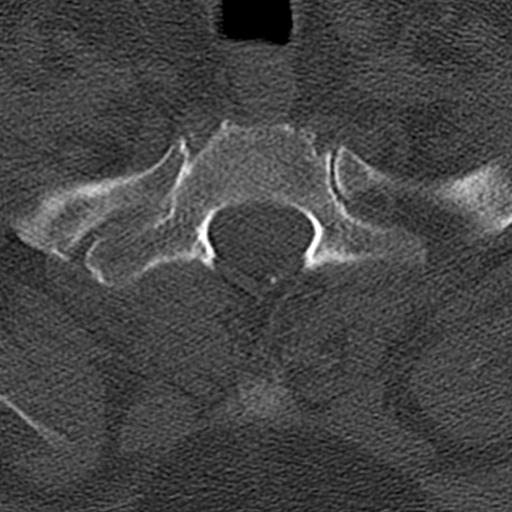
[im 28/83  bone]
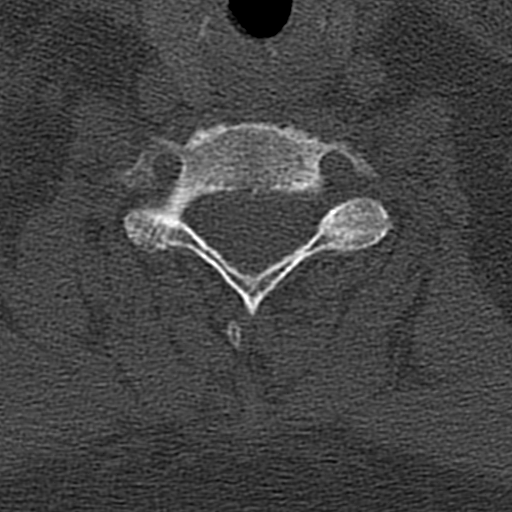
[im 42/83  bone]
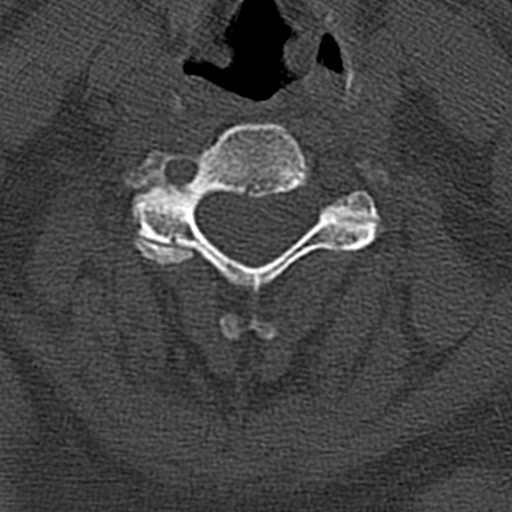
[im 55/83  bone]
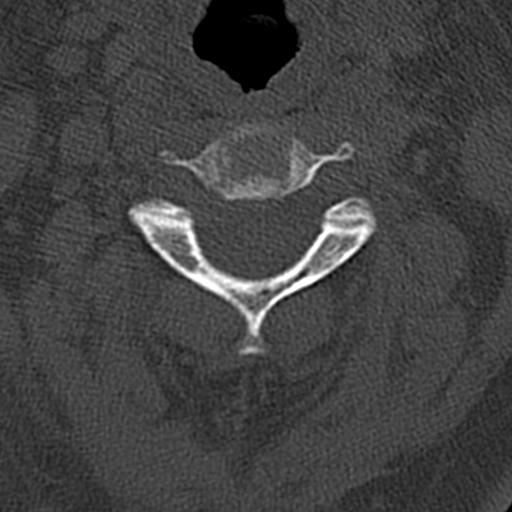
[im 69/83  soft-tissue]
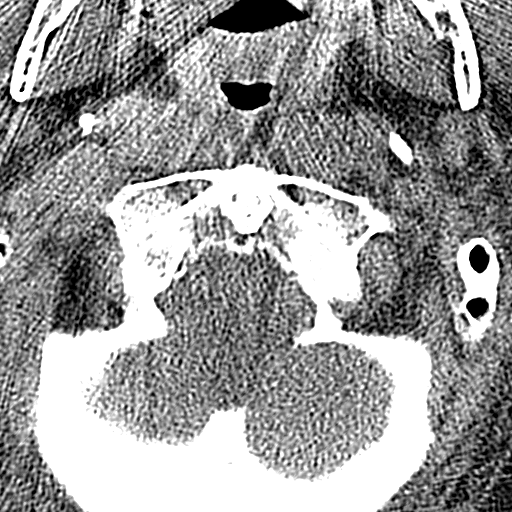
[im 69/83  bone]
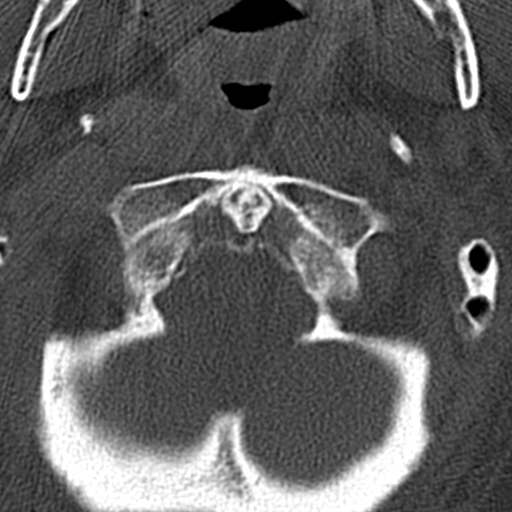

[13 of 35 positions shown; findings below may reference images not displayed]

FINDINGS: CT HEAD FINDINGS

Brain: No evidence of acute infarction, hemorrhage, hydrocephalus,
extra-axial collection or mass lesion/mass effect.

Vascular: Minimal atherosclerotic calcification of the internal
carotid arteries. No hyperdense vessels.

Skull: No acute abnormality.  Hyperostosis frontalis.

Sinuses/Orbits: No acute finding.

Other: None

CT CERVICAL SPINE FINDINGS

Alignment: Normal.

Skull base and vertebrae: No acute fracture. No primary bone lesion
or focal pathologic process.

Soft tissues and spinal canal: No prevertebral fluid or swelling. No
visible canal hematoma.

Disc levels:  Mild degenerative changes at C4-5, C5-6, and C6-7.

Upper chest: Negative.

Other: None
IMPRESSION: 1.  No evidence for acute intracranial abnormality.
2.  No evidence for acute cervical spine abnormality.

## 2021-11-13 IMAGING — CT CT HEAD W/O CM
4 series · 16 of 47 positions shown, 18 images · non-contrast
Comparison: None.

CLINICAL DATA: Mental status changes. Found down at home in the
bathroom.



[Series 2: head w o · axial · 0.39mm/px · z∈[+1214,+1334]mm · 7 of 33 slices shown, 9 images]
[im 5/33  brain]
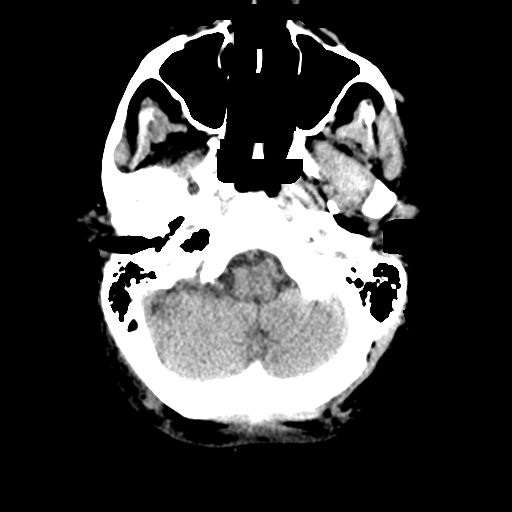
[im 5/33  bone]
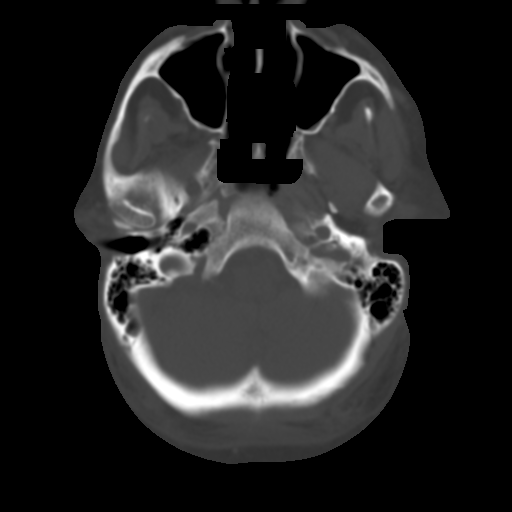
[im 9/33  brain]
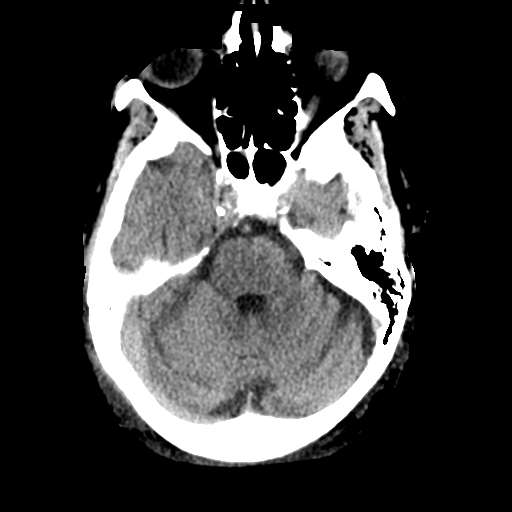
[im 13/33  brain]
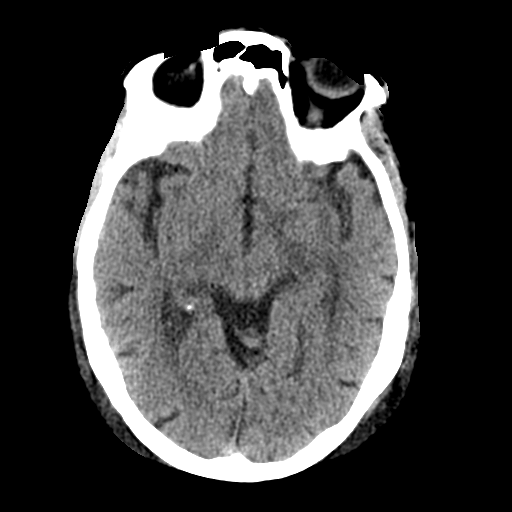
[im 17/33  brain]
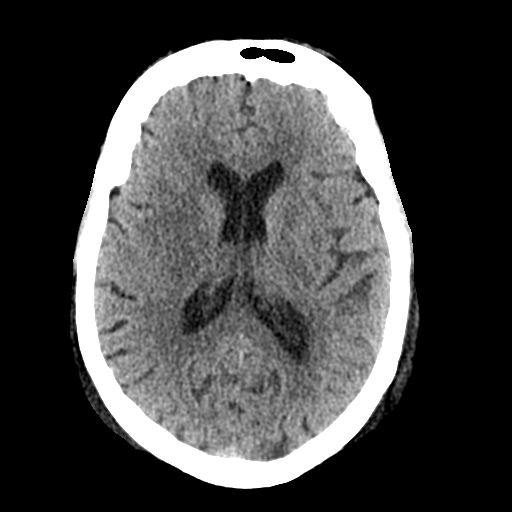
[im 21/33  brain]
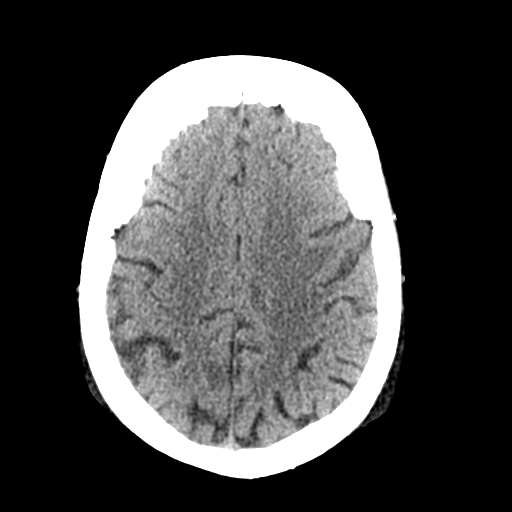
[im 21/33  bone]
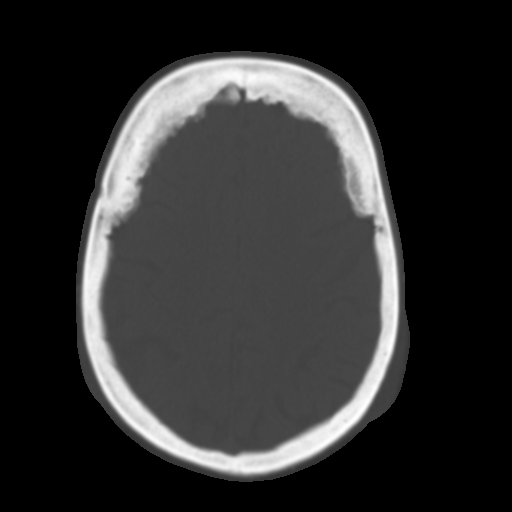
[im 25/33  brain]
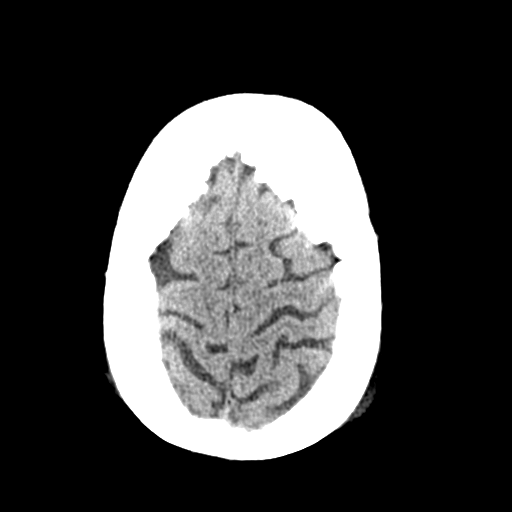
[im 29/33  brain]
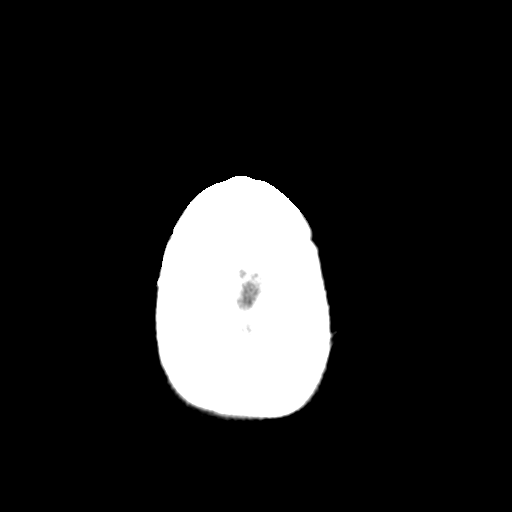

[Series 3: head bone · axial · 0.39mm/px · z∈[+1210,+1242]mm · 3 of 82 slices shown]
[im 9/82  bone]
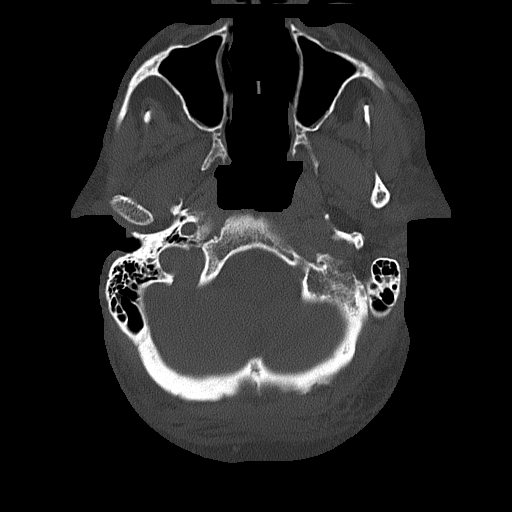
[im 17/82  bone]
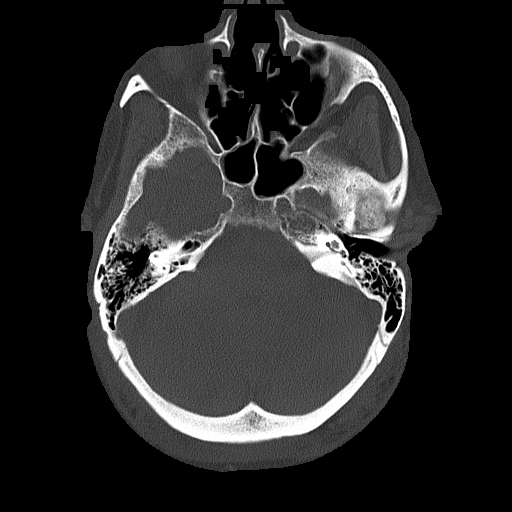
[im 25/82  bone]
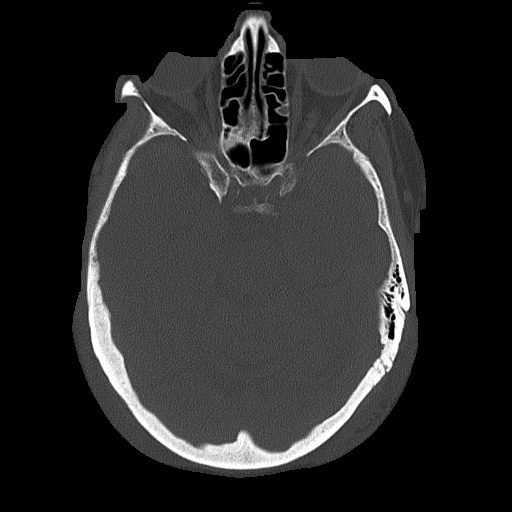

[Series 4: coronal soft · coronal · 0.32mm/px · 3 of 67 slices shown]
[im 23/67  brain]
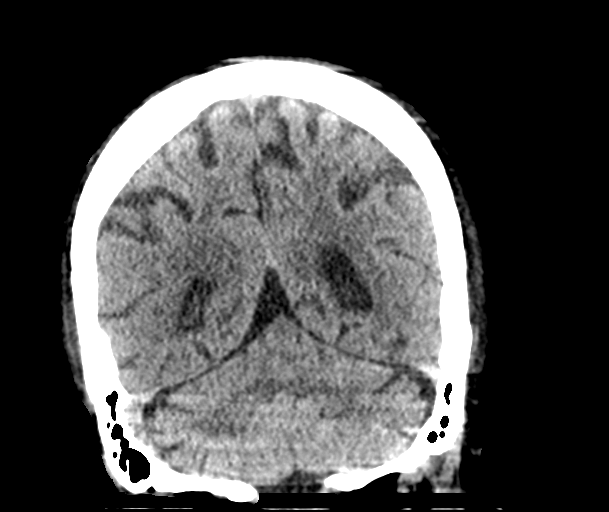
[im 30/67  brain]
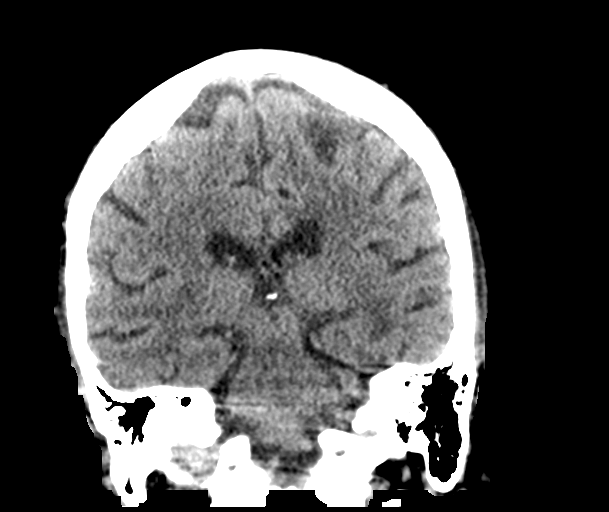
[im 37/67  brain]
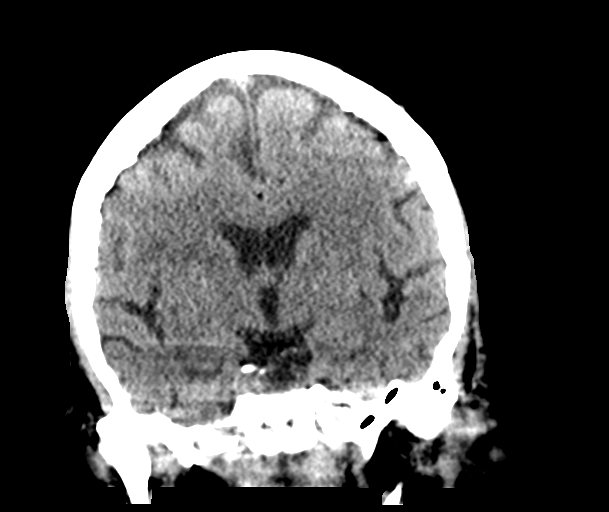

[Series 5: sagittal soft · sagittal · 0.35mm/px · 3 of 53 slices shown]
[im 18/53  brain]
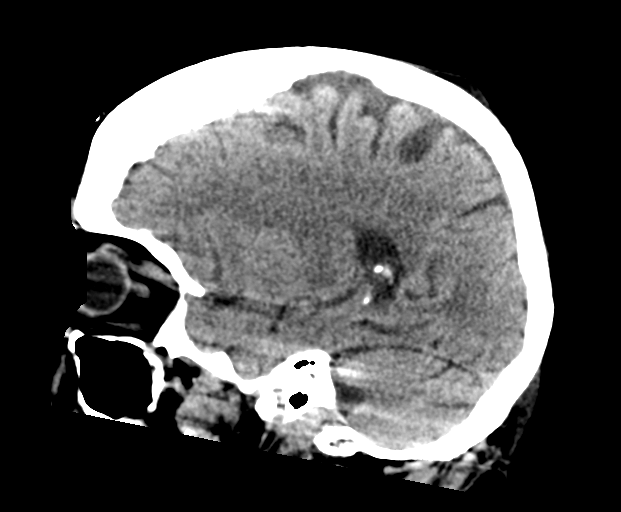
[im 27/53  brain]
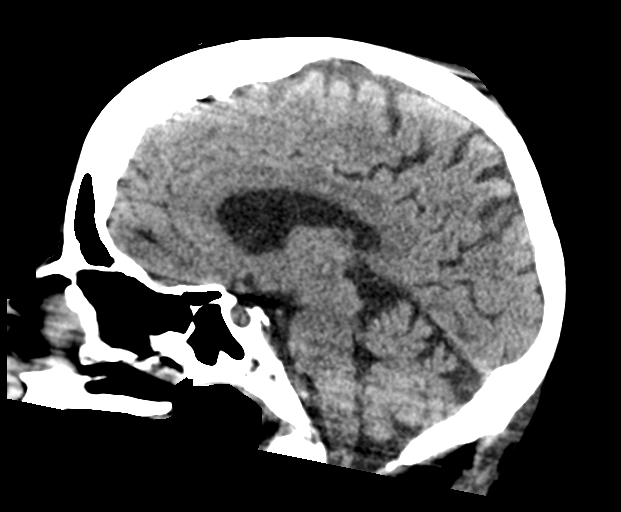
[im 35/53  brain]
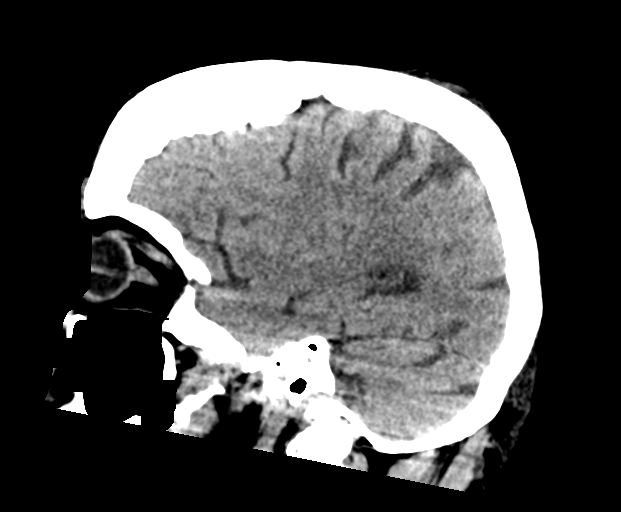

[16 of 47 positions shown; findings below may reference images not displayed]

FINDINGS: CT HEAD FINDINGS

Brain: No evidence of acute infarction, hemorrhage, hydrocephalus,
extra-axial collection or mass lesion/mass effect.

Vascular: Minimal atherosclerotic calcification of the internal
carotid arteries. No hyperdense vessels.

Skull: No acute abnormality.  Hyperostosis frontalis.

Sinuses/Orbits: No acute finding.

Other: None

CT CERVICAL SPINE FINDINGS

Alignment: Normal.

Skull base and vertebrae: No acute fracture. No primary bone lesion
or focal pathologic process.

Soft tissues and spinal canal: No prevertebral fluid or swelling. No
visible canal hematoma.

Disc levels:  Mild degenerative changes at C4-5, C5-6, and C6-7.

Upper chest: Negative.

Other: None
IMPRESSION: 1.  No evidence for acute intracranial abnormality.
2.  No evidence for acute cervical spine abnormality.

## 2021-11-13 MED ORDER — SODIUM CHLORIDE 0.9 % IV SOLN
2.0000 g | Freq: Once | INTRAVENOUS | Status: AC
  Administered 2021-11-13: 2 g via INTRAVENOUS
  Filled 2021-11-13: qty 2

## 2021-11-13 MED ORDER — INSULIN ASPART 100 UNIT/ML IJ SOLN
0.0000 [IU] | Freq: Three times a day (TID) | INTRAMUSCULAR | Status: DC
  Administered 2021-11-14: 5 [IU] via SUBCUTANEOUS
  Administered 2021-11-14 (×2): 3 [IU] via SUBCUTANEOUS
  Administered 2021-11-15 (×2): 2 [IU] via SUBCUTANEOUS
  Administered 2021-11-15: 3 [IU] via SUBCUTANEOUS

## 2021-11-13 MED ORDER — SIMVASTATIN 20 MG PO TABS
20.0000 mg | ORAL_TABLET | Freq: Every day | ORAL | Status: DC
  Administered 2021-11-13 – 2021-11-14 (×2): 20 mg via ORAL
  Filled 2021-11-13 (×2): qty 1

## 2021-11-13 MED ORDER — ZINC SULFATE 220 (50 ZN) MG PO CAPS
220.0000 mg | ORAL_CAPSULE | Freq: Every day | ORAL | Status: DC
  Administered 2021-11-13 – 2021-11-15 (×3): 220 mg via ORAL
  Filled 2021-11-13 (×3): qty 1

## 2021-11-13 MED ORDER — ATENOLOL 25 MG PO TABS
25.0000 mg | ORAL_TABLET | Freq: Every day | ORAL | Status: DC
  Administered 2021-11-14 – 2021-11-15 (×2): 25 mg via ORAL
  Filled 2021-11-13 (×2): qty 1

## 2021-11-13 MED ORDER — AMLODIPINE BESYLATE 5 MG PO TABS
5.0000 mg | ORAL_TABLET | Freq: Every day | ORAL | Status: DC
  Administered 2021-11-14 – 2021-11-15 (×2): 5 mg via ORAL
  Filled 2021-11-13 (×2): qty 1

## 2021-11-13 MED ORDER — SODIUM CHLORIDE 0.9 % IV SOLN: INTRAVENOUS | Status: AC

## 2021-11-13 MED ORDER — ONDANSETRON HCL 4 MG/2ML IJ SOLN: 4.0000 mg | Freq: Four times a day (QID) | INTRAMUSCULAR | Status: DC | PRN

## 2021-11-13 MED ORDER — ASCORBIC ACID 500 MG PO TABS
500.0000 mg | ORAL_TABLET | Freq: Every day | ORAL | Status: DC
  Administered 2021-11-13 – 2021-11-15 (×3): 500 mg via ORAL
  Filled 2021-11-13 (×3): qty 1

## 2021-11-13 MED ORDER — ENOXAPARIN SODIUM 30 MG/0.3ML IJ SOSY
30.0000 mg | PREFILLED_SYRINGE | INTRAMUSCULAR | Status: DC
  Administered 2021-11-13 – 2021-11-14 (×2): 30 mg via SUBCUTANEOUS
  Filled 2021-11-13 (×2): qty 0.3

## 2021-11-13 MED ORDER — INSULIN ASPART 100 UNIT/ML IJ SOLN
0.0000 [IU] | Freq: Every day | INTRAMUSCULAR | Status: DC
  Administered 2021-11-13: 3 [IU] via SUBCUTANEOUS
  Administered 2021-11-14: 2 [IU] via SUBCUTANEOUS

## 2021-11-13 MED ORDER — SODIUM CHLORIDE 0.9 % IV BOLUS
2000.0000 mL | Freq: Once | INTRAVENOUS | Status: AC
  Administered 2021-11-13: 2000 mL via INTRAVENOUS

## 2021-11-13 MED ORDER — VANCOMYCIN HCL 1500 MG/300ML IV SOLN
1500.0000 mg | Freq: Once | INTRAVENOUS | Status: AC
  Administered 2021-11-13: 1500 mg via INTRAVENOUS
  Filled 2021-11-13: qty 300

## 2021-11-13 MED ORDER — VANCOMYCIN HCL IN DEXTROSE 1-5 GM/200ML-% IV SOLN: 1000.0000 mg | INTRAVENOUS | Status: DC

## 2021-11-13 NOTE — ED Notes (Signed)
Attempted to call report on unit 300, no answer after multiple rings

## 2021-11-13 NOTE — ED Notes (Signed)
Daughter concerned pt is talking to other people in the room and not like her at all.  EDP made aware and that daughter would like to speak with him.

## 2021-11-13 NOTE — H&P (Signed)
History and Physical  Krista Payne EHU:[id number] DOB: April 24, 1941 DOA: 11/13/2021  Referring physician: Godfrey Pick, MD PCP: Celene Squibb, MD  Patient coming from: Home  Chief Complaint: Altered mental status  HPI: Krista Payne is a 81 y.o. female with medical history significant for T2DM, CKD stage 3B, anemia of chronic disease, essential hypertension and hyperlipidemia who presents to the emergency department via EMS due to altered mental status.  Patient was unable to provide history, history was obtained from ED physician, ED medical record and daughter at bedside.  Per daughter, patient lives alone and was able to take care of her ADLs at baseline.  Patient was suspected to have sustained a fall while in the bathroom on Saturday night/early Sunday morning.  Daughter states that she was unable to reach the patient on the telephone since Friday, so a welfare check was called and patient was found on bathroom floor covered in urine and with blood on her hands.  Patient was then taken to the ED for further evaluation and management.  ED Course:  In the emergency department, patient was tachypneic, initially tachycardic, BP was 147/79.  Work-up in the ED showed normal CBC except for mild leukocytosis, BMP showed BUN/creatinine 49/1.72 (creatinine within baseline range), hyperglycemia.  AST 69, troponin x2 -515 > 484, total CK 2534, lactic acid 2.3.  Influenza A, B was negative, SARS coronavirus 2 was positive.  Spine CT without contrast showed no evidence for acute intracranial abnormality CT cervical spine without contrast showed no evidence of acute cervical spine abnormality CT head without contrast showed no acute intracranial abnormality Right foot x-ray showed no acute fracture or dislocation Patient was empirically started on IV vancomycin and cefepime, IV hydration was provided.  Hospitalist was asked to admit patient for further evaluation and management.   Review of Systems: This  cannot be obtained at this time due to patient's altered mental status   Past Medical History:  Diagnosis Date   Chronic kidney disease    Diabetes mellitus without complication ([place])    Hyperlipidemia    Hypertension    Macular degeneration    Osteoarthritis    Past Surgical History:  Procedure Laterality Date   CATARACT EXTRACTION W/PHACO Right 02/07/2015   Procedure: CATARACT EXTRACTION PHACO AND INTRAOCULAR LENS PLACEMENT ([place]);  Surgeon: Tonny Branch, MD;  Location: [facility];  Service: Ophthalmology;  Laterality: Right;  CDE:12.72   CATARACT EXTRACTION W/PHACO Left 02/24/2015   Procedure: CATARACT EXTRACTION PHACO AND INTRAOCULAR LENS PLACEMENT LEFT EYE;  Surgeon: Tonny Branch, MD;  Location: [facility];  Service: Ophthalmology;  Laterality: Left;  CDE 11.69   COLONOSCOPY      Social History:  reports that she has never smoked. She has never used smokeless tobacco. She reports that she does not drink alcohol and does not use drugs.   No Known Allergies  Family History  Problem Relation Age of Onset   Macular degeneration Mother    Diabetes Father    Heart disease Daughter      Prior to Admission medications   Medication Sig Start Date End Date Taking? Authorizing Provider  albuterol (VENTOLIN HFA) 108 (90 Base) MCG/ACT inhaler  02/02/20   [provider]  amLODipine (NORVASC) 5 MG tablet Take 5 mg by mouth daily.    [provider]  anastrozole (ARIMIDEX) 1 MG tablet Take 1 tablet (1 mg total) by mouth daily. 02/27/21   Derek Jack, MD  atenolol (TENORMIN) 25 MG tablet Take by mouth daily.  [provider]  Blood Glucose Monitoring Suppl (FREESTYLE LITE) w/Device KIT USE TO CHECK BLOOD SUGARTONCE DAILY AS DIRECTED 12/06/20   [provider]  buPROPion (WELLBUTRIN XL) 150 MG 24 hr tablet Take 150 mg by mouth daily.    [provider]  Calcium-Vitamin D-Vitamin K (VIACTIV CALCIUM PLUS D) 650-12.5-40 MG-MCG-MCG CHEW Chew by mouth daily.  Pt is taking 2 chewables daily Patient not taking: Reported on 10/03/2021    [provider]  empagliflozin (JARDIANCE) 10 MG TABS tablet Take 10 mg by mouth daily. Patient not taking: Reported on 10/03/2021    [provider]  FREESTYLE LITE test strip daily. 12/06/20   [provider]  furosemide (LASIX) 20 MG tablet Take 10 mg by mouth daily. 03/08/21   [provider]  glipiZIDE (GLUCOTROL XL) 5 MG 24 hr tablet Take 5 mg by mouth daily. 04/17/21   [provider]  JANUMET XR 289 [id number] MG TB24 Take 1 tablet by mouth daily. Patient not taking: Reported on 10/03/2021 05/04/21   [provider]  Lancets (FREESTYLE) lancets daily. 12/06/20   [provider]  levocetirizine (XYZAL) 5 MG tablet Take 5 mg by mouth every evening.    [provider]  Melatonin 5 MG SUBL Place 1 tablet under the tongue every evening. PRN Patient not taking: Reported on 10/03/2021    [provider]  Multiple Vitamins-Minerals (PRESERVISION AREDS PO) Take 1 capsule by mouth 2 (two) times daily. Patient not taking: Reported on 10/03/2021    [provider]  olmesartan (BENICAR) 40 MG tablet Take 40 mg by mouth daily. Patient not taking: Reported on 10/03/2021    [provider]  predniSONE (DELTASONE) 20 MG tablet  02/02/20   [provider]  simvastatin (ZOCOR) 20 MG tablet Take 1 tablet (20 mg total) by mouth at bedtime. 01/29/12   Roderick Pee, MD  sodium polystyrene (KAYEXALATE) 15 GM/60ML suspension  06/08/20   [provider]  triamcinolone ointment (KENALOG) 0.5 % Apply topically 2 (two) times daily. Please give pt a 90 day supply 02/07/12   Roderick Pee, MD    Physical Exam: BP (!) 167/69    Pulse 81    Temp 98 F (36.7 C) (Oral)    Resp 14    Ht 5\' 2"  (1.575 m)    Wt 98.9 kg    SpO2 96%    BMI 39.88 kg/m   General: 81 y.o. year-old female well developed well nourished in no acute distress.  Alert and  oriented x3. HEENT: Dry mucous membrane.  NCAT, EOMI Neck: Supple, trachea medial Cardiovascular: Regular rate and rhythm with no rubs or gallops.  No thyromegaly or JVD noted.  2/4 pulses in all 4 extremities. Respiratory: Tachypnea.  Clear to auscultation with no wheezes or rales.  Abdomen: Soft, nontender nondistended with normal bowel sounds x4 quadrants. Muskuloskeletal: Right lateral ankle wound.  No cyanosis or clubbing noted bilaterally Neuro: CN II-XII intact, strength 5/5 x 4, sensation, reflexes intact Skin: No ulcerative lesions noted or rashes Psychiatry: Delirious, but mood is appropriate for condition and setting          Labs on Admission:  Basic Metabolic Panel: Recent Labs  Lab 11/13/21 1639  NA 136  K 3.7  CL 101  CO2 21*  GLUCOSE 331*  BUN 49*  CREATININE 1.72*  CALCIUM 9.3  MG 1.9   Liver Function Tests: Recent Labs  Lab 11/13/21 1639  AST 69*  ALT 38  ALKPHOS 74  BILITOT 0.7  PROT 7.4  ALBUMIN 3.5   No results for input(s): LIPASE, AMYLASE in the last 168 hours. No results for input(s): AMMONIA in the last 168 hours. CBC: Recent Labs  Lab 11/13/21 1639  WBC 10.9*  NEUTROABS 9.6*  HGB 14.0  HCT 44.4  MCV 89.3  PLT 362   Cardiac Enzymes: Recent Labs  Lab 11/13/21 1639  CKTOTAL 2,534*    BNP (last 3 results) No results for input(s): BNP in the last 8760 hours.  ProBNP (last 3 results) No results for input(s): PROBNP in the last 8760 hours.  CBG: Recent Labs  Lab 11/13/21 1703  GLUCAP 312*    Radiological Exams on Admission: CT HEAD WO CONTRAST  Result Date: 11/13/2021 CLINICAL DATA:  Mental status changes. Found down at home in the bathroom. EXAM: CT HEAD WITHOUT CONTRAST CT CERVICAL SPINE WITHOUT CONTRAST TECHNIQUE: Multidetector CT imaging of the head and cervical spine was performed following the standard protocol without intravenous contrast. Multiplanar CT image reconstructions of the cervical spine were also  generated. RADIATION DOSE REDUCTION: This exam was performed according to the departmental dose-optimization program which includes automated exposure control, adjustment of the mA and/or kV according to patient size and/or use of iterative reconstruction technique. COMPARISON:  None. FINDINGS: CT HEAD FINDINGS Brain: No evidence of acute infarction, hemorrhage, hydrocephalus, extra-axial collection or mass lesion/mass effect. Vascular: Minimal atherosclerotic calcification of the internal carotid arteries. No hyperdense vessels. Skull: No acute abnormality.  Hyperostosis frontalis. Sinuses/Orbits: No acute finding. Other: None CT CERVICAL SPINE FINDINGS Alignment: Normal. Skull base and vertebrae: No acute fracture. No primary bone lesion or focal pathologic process. Soft tissues and spinal canal: No prevertebral fluid or swelling. No visible canal hematoma. Disc levels:  Mild degenerative changes at C4-5, C5-6, and C6-7. Upper chest: Negative. Other: None IMPRESSION: 1.  No evidence for acute intracranial abnormality. 2.  No evidence for acute cervical spine abnormality. Electronically Signed   By: Nolon Nations M.D.   On: 11/13/2021 17:39   CT CERVICAL SPINE WO CONTRAST  Result Date: 11/13/2021 CLINICAL DATA:  Mental status changes. Found down at home in the bathroom. EXAM: CT HEAD WITHOUT CONTRAST CT CERVICAL SPINE WITHOUT CONTRAST TECHNIQUE: Multidetector CT imaging of the head and cervical spine was performed following the standard protocol without intravenous contrast. Multiplanar CT image reconstructions of the cervical spine were also generated. RADIATION DOSE REDUCTION: This exam was performed according to the departmental dose-optimization program which includes automated exposure control, adjustment of the mA and/or kV according to patient size and/or use of iterative reconstruction technique. COMPARISON:  None. FINDINGS: CT HEAD FINDINGS Brain: No evidence of acute infarction, hemorrhage,  hydrocephalus, extra-axial collection or mass lesion/mass effect. Vascular: Minimal atherosclerotic calcification of the internal carotid arteries. No hyperdense vessels. Skull: No acute abnormality.  Hyperostosis frontalis. Sinuses/Orbits: No acute finding. Other: None CT CERVICAL SPINE FINDINGS Alignment: Normal. Skull base and vertebrae: No acute fracture. No primary bone lesion or focal pathologic process. Soft tissues and spinal canal: No prevertebral fluid or swelling. No visible canal hematoma. Disc levels:  Mild degenerative changes at C4-5, C5-6, and C6-7. Upper chest: Negative. Other: None IMPRESSION: 1.  No evidence for acute intracranial abnormality. 2.  No evidence for acute cervical spine abnormality. Electronically Signed   By: Nolon Nations M.D.   On: 11/13/2021 17:39   DG Foot 2 Views Right  Result Date: 11/13/2021 CLINICAL DATA:  Patient found down with lesion on the right foot. EXAM: RIGHT  FOOT - 2 VIEW COMPARISON:  None. FINDINGS: There is no evidence of an acute fracture or dislocation. Moderate severity degenerative changes are seen along the dorsal aspect of the mid right foot. Chronic appearing linear soft tissue calcifications are noted adjacent to the plantar aspect of the right calcaneus and along the insertion of the right Achilles tendon. IMPRESSION: No acute fracture or dislocation. Electronically Signed   By: Virgina Norfolk M.D.   On: 11/13/2021 20:55    EKG: I independently viewed the EKG done and my findings are as followed: Sinus tachycardia at a rate of 101 bpm with RBBB.  Assessment/Plan Present on Admission:  Rhabdomyolysis  Principal Problem:   Rhabdomyolysis Active Problems:   Mixed hyperlipidemia   Essential hypertension   Chronic kidney disease   Elevated troponin   Lactic acidosis   COVID-19 virus infection   Hyperglycemia due to diabetes mellitus (HCC)   Ankle wound, right, initial encounter   Altered mental status   Dehydration  Altered  mental status secondary to multifactorial including dehydration Continue IV hydration Continue fall precaution and neurochecks  Fall at home Continue fall precaution neurochecks Consult PT/OT eval and treat  Rhabdomyolysis Total CK 2534, patient was reported to be on the floor for more than 1 day Continue IV hydration and continue to monitor total CK  Elevated troponin possible secondary to type II demand ischemia Troponin x2 -515 > 484; troponin already started to trend downwards and there was no acute EKG changes  Lactic acidosis secondary to multifactorial Lactic acid 2.3, continue to trend lactic acid  COVID-19 virus infection (incidental finding only) Continue supportive care Continue vitamin C and zinc Continue airborne isolation  Right ankle wound Continue wound care Patient was initially started on vancomycin and cefepime Continue with vancomycin  Hyperglycemia secondary to T2DM CBG 331, continue ISS and hypoglycemic protocol Glipizide and Janumet will be held at this time  Essential hypertension Continue amlodipine and atenolol  Mixed hyperlipidemia Continue Zocor  DVT prophylaxis: Lovenox  Code Status: Full code  Family Communication: None at bedside  Disposition Plan:  Patient is from:                        home Anticipated DC to:                   SNF or family members home Anticipated DC date:               2-3 days Anticipated DC barriers:        Patient requires inpatient management due to severity of symptoms  Consults called: None  Admission status: Inpatient    Bernadette Hoit MD [facility]  11/13/2021, 9:10 PM

## 2021-11-13 NOTE — ED Notes (Signed)
Pt going to CT at this time.

## 2021-11-13 NOTE — ED Notes (Signed)
Pt with wound to right outer foot, no dsg in place at time of arrival.    Pt with pressure injury to lower back/sacrum noted at time of arrival.

## 2021-11-13 NOTE — Progress Notes (Signed)
Patient arrived to floor with abrasion/skin tear to upper back.  Patient has deep tissue injury to sacrum area and a stage 2 to buttocks. Patient has stage 3 area to right foot, which has oozes green frothy fluid with surrounding skin is palpated.

## 2021-11-13 NOTE — ED Provider Notes (Signed)
[facility] Provider Note   CSN: [id number] Arrival date & time: 11/13/21  1608     History  Chief Complaint  Patient presents with   Altered Mental Status    Krista Payne is a 81 y.o. female.   Altered Mental Status Patient presents for altered mental status.  She arrives from home by EMS.  Daughter states that patient has not been reachable by telephone since Friday.  Welfare check was called.  Patient was found on bathroom floor covered in urine with blood on her hands.  CBG with EMS was 366.  Per chart review, medical history notable for HLD, HTN, osteoarthritis, CKD.  Patient is unable to provide any history on arrival but does state that she has no areas of pain.  History per daughter: Patient lives alone independently with no cognitive decline.  She typically ambulates with a cane.  Patient's daughter spoke with the patient on Friday.  She also was told by family friend that patient was spoken to on the phone by the friend on Saturday.  Patient did not show up to church on Sunday.  Patient was found on the bathroom floor today.  Patient's daughter estimates that patient was likely on the floor for 36 hours.    Home Medications Prior to Admission medications   Medication Sig Start Date End Date Taking? Authorizing Provider  amLODipine (NORVASC) 5 MG tablet Take 5 mg by mouth daily.   Yes [provider]  anastrozole (ARIMIDEX) 1 MG tablet Take 1 tablet (1 mg total) by mouth daily. 02/27/21  Yes Derek Jack, MD  atenolol (TENORMIN) 25 MG tablet Take 25 mg by mouth daily.   Yes [provider]  buPROPion (WELLBUTRIN XL) 150 MG 24 hr tablet Take 150 mg by mouth daily.   Yes [provider]  furosemide (LASIX) 20 MG tablet Take 10 mg by mouth daily. 03/08/21  Yes [provider]  glipiZIDE (GLUCOTROL XL) 5 MG 24 hr tablet Take 5 mg by mouth daily. 04/17/21  Yes [provider]  levocetirizine (XYZAL) 5 MG  tablet Take 5 mg by mouth every evening.   Yes [provider]  olmesartan (BENICAR) 5 MG tablet Take 5 mg by mouth daily. 11/03/21  Yes [provider]  simvastatin (ZOCOR) 20 MG tablet Take 1 tablet (20 mg total) by mouth at bedtime. 01/29/12  Yes Dorena Cookey, MD  triamcinolone ointment (KENALOG) 0.5 % Apply topically 2 (two) times daily. Please give pt a 90 day supply Patient not taking: Reported on 11/14/2021 02/07/12   Dorena Cookey, MD      Allergies    Patient has no known allergies.    Review of Systems   Review of Systems  Unable to perform ROS: Mental status change   Physical Exam Updated Vital Signs BP (!) 134/53 (BP Location: Left Arm)    Pulse 77    Temp 97.6 F (36.4 C) (Oral)    Resp 19    Ht 5\' 2"  (1.575 m)    Wt 98.9 kg    SpO2 96%    BMI 39.88 kg/m  Physical Exam Vitals and nursing note reviewed.  Constitutional:      General: She is not in acute distress.    Appearance: She is well-developed. She is ill-appearing. She is not toxic-appearing or diaphoretic.  HENT:     Head: Normocephalic and atraumatic.     Right Ear: External ear normal.     Left Ear:  External ear normal.     Nose: Nose normal.     Mouth/Throat:     Mouth: Mucous membranes are dry.  Eyes:     Extraocular Movements: Extraocular movements intact.     Conjunctiva/sclera: Conjunctivae normal.  Cardiovascular:     Rate and Rhythm: Regular rhythm. Tachycardia present.     Heart sounds: No murmur heard. Pulmonary:     Effort: Pulmonary effort is normal. No respiratory distress.     Breath sounds: Normal breath sounds. No wheezing or rales.  Chest:     Chest wall: No tenderness.  Abdominal:     General: There is no distension.     Palpations: Abdomen is soft.     Tenderness: There is no abdominal tenderness. There is no right CVA tenderness or left CVA tenderness.  Musculoskeletal:        General: No swelling or deformity.     Cervical back: Normal range of motion and neck  supple. No rigidity.  Skin:    General: Skin is warm and dry.     Capillary Refill: Capillary refill takes less than 2 seconds.     Comments: Small scratches throughout distal upper extremities.  Pressure wound to lumbar spine.  Chronic ulcerated wound to dorsum of right foot  Neurological:     General: No focal deficit present.     Mental Status: She is alert. She is disoriented.     Cranial Nerves: No cranial nerve deficit.  Psychiatric:        Mood and Affect: Mood normal.    ED Results / Procedures / Treatments   Labs (all labs ordered are listed, but only abnormal results are displayed) Labs Reviewed  RESP PANEL BY RT-PCR (FLU A&B, COVID) ARPGX2 - Abnormal; Notable for the following components:      Result Value   SARS Coronavirus 2 by RT PCR POSITIVE (*)    All other components within normal limits  COMPREHENSIVE METABOLIC PANEL - Abnormal; Notable for the following components:   CO2 21 (*)    Glucose, Bld 331 (*)    BUN 49 (*)    Creatinine, Ser 1.72 (*)    AST 69 (*)    GFR, Estimated 30 (*)    All other components within normal limits  CBC WITH DIFFERENTIAL/PLATELET - Abnormal; Notable for the following components:   WBC 10.9 (*)    Neutro Abs 9.6 (*)    Lymphs Abs 0.4 (*)    All other components within normal limits  URINALYSIS, ROUTINE W REFLEX MICROSCOPIC - Abnormal; Notable for the following components:   Glucose, UA 250 (*)    Hgb urine dipstick MODERATE (*)    Protein, ur 100 (*)    Leukocytes,Ua MODERATE (*)    All other components within normal limits  LACTIC ACID, PLASMA - Abnormal; Notable for the following components:   Lactic Acid, Venous 2.3 (*)    All other components within normal limits  CK - Abnormal; Notable for the following components:   Total CK 2,534 (*)    All other components within normal limits  CK - Abnormal; Notable for the following components:   Total CK 1,809 (*)    All other components within normal limits  URINALYSIS,  MICROSCOPIC (REFLEX) - Abnormal; Notable for the following components:   Bacteria, UA MANY (*)    All other components within normal limits  COMPREHENSIVE METABOLIC PANEL - Abnormal; Notable for the following components:   CO2 20 (*)  Glucose, Bld 309 (*)    BUN 55 (*)    Creatinine, Ser 1.66 (*)    Calcium 8.3 (*)    Total Protein 6.2 (*)    Albumin 3.0 (*)    AST 51 (*)    GFR, Estimated 31 (*)    All other components within normal limits  CBC - Abnormal; Notable for the following components:   WBC 11.1 (*)    Hemoglobin 11.9 (*)    All other components within normal limits  HEMOGLOBIN A1C - Abnormal; Notable for the following components:   Hgb A1c MFr Bld 9.8 (*)    All other components within normal limits  GLUCOSE, CAPILLARY - Abnormal; Notable for the following components:   Glucose-Capillary 298 (*)    All other components within normal limits  GLUCOSE, CAPILLARY - Abnormal; Notable for the following components:   Glucose-Capillary 232 (*)    All other components within normal limits  VITAMIN B12 - Abnormal; Notable for the following components:   Vitamin B-12 2,464 (*)    All other components within normal limits  C-REACTIVE PROTEIN - Abnormal; Notable for the following components:   CRP 17.4 (*)    All other components within normal limits  D-DIMER, QUANTITATIVE - Abnormal; Notable for the following components:   D-Dimer, Quant 2.01 (*)    All other components within normal limits  GLUCOSE, CAPILLARY - Abnormal; Notable for the following components:   Glucose-Capillary 276 (*)    All other components within normal limits  GLUCOSE, CAPILLARY - Abnormal; Notable for the following components:   Glucose-Capillary 249 (*)    All other components within normal limits  GLUCOSE, CAPILLARY - Abnormal; Notable for the following components:   Glucose-Capillary 220 (*)    All other components within normal limits  CBG MONITORING, ED - Abnormal; Notable for the following  components:   Glucose-Capillary 312 (*)    All other components within normal limits  TROPONIN I (HIGH SENSITIVITY) - Abnormal; Notable for the following components:   Troponin I (High Sensitivity) 515 (*)    All other components within normal limits  TROPONIN I (HIGH SENSITIVITY) - Abnormal; Notable for the following components:   Troponin I (High Sensitivity) 484 (*)    All other components within normal limits  MAGNESIUM  PROTIME-INR  LACTIC ACID, PLASMA  LACTIC ACID, PLASMA  MAGNESIUM  PHOSPHORUS  FERRITIN  PROCALCITONIN  COMPREHENSIVE METABOLIC PANEL  CBC  MAGNESIUM  C-REACTIVE PROTEIN  FERRITIN  D-DIMER, QUANTITATIVE  CK    EKG EKG Interpretation  Date/Time:  Monday November 13 2021 16:49:16 EST Ventricular Rate:  101 PR Interval:  188 QRS Duration: 125 QT Interval:  377 QTC Calculation: 489 R Axis:   -21 Text Interpretation: Sinus tachycardia Right bundle branch block Confirmed by Godfrey Pick [phone]) on 11/13/2021 5:25:25 PM  Radiology CT HEAD WO CONTRAST  Result Date: 11/13/2021 CLINICAL DATA:  Mental status changes. Found down at home in the bathroom. EXAM: CT HEAD WITHOUT CONTRAST CT CERVICAL SPINE WITHOUT CONTRAST TECHNIQUE: Multidetector CT imaging of the head and cervical spine was performed following the standard protocol without intravenous contrast. Multiplanar CT image reconstructions of the cervical spine were also generated. RADIATION DOSE REDUCTION: This exam was performed according to the departmental dose-optimization program which includes automated exposure control, adjustment of the mA and/or kV according to patient size and/or use of iterative reconstruction technique. COMPARISON:  None. FINDINGS: CT HEAD FINDINGS Brain: No evidence of acute infarction, hemorrhage, hydrocephalus, extra-axial collection or  mass lesion/mass effect. Vascular: Minimal atherosclerotic calcification of the internal carotid arteries. No hyperdense vessels. Skull: No acute  abnormality.  Hyperostosis frontalis. Sinuses/Orbits: No acute finding. Other: None CT CERVICAL SPINE FINDINGS Alignment: Normal. Skull base and vertebrae: No acute fracture. No primary bone lesion or focal pathologic process. Soft tissues and spinal canal: No prevertebral fluid or swelling. No visible canal hematoma. Disc levels:  Mild degenerative changes at C4-5, C5-6, and C6-7. Upper chest: Negative. Other: None IMPRESSION: 1.  No evidence for acute intracranial abnormality. 2.  No evidence for acute cervical spine abnormality. Electronically Signed   By: Nolon Nations M.D.   On: 11/13/2021 17:39   CT CERVICAL SPINE WO CONTRAST  Result Date: 11/13/2021 CLINICAL DATA:  Mental status changes. Found down at home in the bathroom. EXAM: CT HEAD WITHOUT CONTRAST CT CERVICAL SPINE WITHOUT CONTRAST TECHNIQUE: Multidetector CT imaging of the head and cervical spine was performed following the standard protocol without intravenous contrast. Multiplanar CT image reconstructions of the cervical spine were also generated. RADIATION DOSE REDUCTION: This exam was performed according to the departmental dose-optimization program which includes automated exposure control, adjustment of the mA and/or kV according to patient size and/or use of iterative reconstruction technique. COMPARISON:  None. FINDINGS: CT HEAD FINDINGS Brain: No evidence of acute infarction, hemorrhage, hydrocephalus, extra-axial collection or mass lesion/mass effect. Vascular: Minimal atherosclerotic calcification of the internal carotid arteries. No hyperdense vessels. Skull: No acute abnormality.  Hyperostosis frontalis. Sinuses/Orbits: No acute finding. Other: None CT CERVICAL SPINE FINDINGS Alignment: Normal. Skull base and vertebrae: No acute fracture. No primary bone lesion or focal pathologic process. Soft tissues and spinal canal: No prevertebral fluid or swelling. No visible canal hematoma. Disc levels:  Mild degenerative changes at C4-5,  C5-6, and C6-7. Upper chest: Negative. Other: None IMPRESSION: 1.  No evidence for acute intracranial abnormality. 2.  No evidence for acute cervical spine abnormality. Electronically Signed   By: Nolon Nations M.D.   On: 11/13/2021 17:39   MR BRAIN WO CONTRAST  Result Date: 11/14/2021 CLINICAL DATA:  81 year old female with neurologic deficit. Altered mental status. Found down. EXAM: MRI HEAD WITHOUT CONTRAST TECHNIQUE: Multiplanar, multiecho pulse sequences of the brain and surrounding structures were obtained without intravenous contrast. COMPARISON:  CT head and cervical spine yesterday. FINDINGS: Brain: No restricted diffusion to suggest acute infarction. No midline shift, mass effect, evidence of mass lesion, ventriculomegaly, extra-axial collection or acute intracranial hemorrhage. Cervicomedullary junction and pituitary are within normal limits. No cortical encephalomalacia or chronic cerebral blood products identified. Mild for age patchy nonspecific white matter T2 and FLAIR hyperintensity. T2 heterogeneity in the bilateral basal ganglia appears in part related to perivascular spaces. Mild T2 heterogeneity in the thalami. Brainstem and cerebellum appear negative. Vascular: Major intracranial vascular flow voids are preserved. Skull and upper cervical spine: Negative for age visible cervical spine. Visualized bone marrow signal is within normal limits. Sinuses/Orbits: Postoperative changes to the globes. Paranasal Visualized paranasal sinuses and mastoids are stable and well aerated. Other: Grossly normal visible internal auditory structures. Trace bilateral mastoid fluid. Small volume retained secretions in the nasopharynx. IMPRESSION: 1. No acute intracranial abnormality. 2. Mild for age signal changes in the brain compatible with chronic small vessel disease. Electronically Signed   By: Genevie Ann M.D.   On: 11/14/2021 10:07   MR FOOT RIGHT WO CONTRAST  Result Date: 11/14/2021 CLINICAL DATA:   Diabetic foot pain and swelling. EXAM: MRI OF THE RIGHT FOREFOOT WITHOUT CONTRAST TECHNIQUE: Multiplanar, multisequence MR imaging  of the right foot was performed. No intravenous contrast was administered. COMPARISON:  Radiographs 11/13/2021 FINDINGS: Diffuse subcutaneous soft tissue swelling/edema/fluid suggesting cellulitis. No discrete fluid collection to suggest a drainable abscess. Mild diffuse myofasciitis without evidence of pyomyositis. Small MTP joint effusions but no findings suspicious for septic arthritis or osteomyelitis. Advanced midfoot degenerative changes with joint space narrowing, small joint effusions and subchondral cystic change and edema. Possible early neuropathic changes. IMPRESSION: 1. Diffuse subcutaneous soft tissue swelling/edema/fluid suggesting cellulitis. 2. Mild diffuse myofasciitis without evidence of pyomyositis. 3. No findings for septic arthritis or osteomyelitis. 4. Advanced midfoot degenerative changes. Electronically Signed   By: Marijo Sanes M.D.   On: 11/14/2021 10:34   US ARTERIAL ABI (SCREENING LOWER EXTREMITY)  Result Date: 11/14/2021 CLINICAL DATA:  Nonhealing right foot ulcer EXAM: NONINVASIVE PHYSIOLOGIC VASCULAR STUDY OF BILATERAL LOWER EXTREMITIES TECHNIQUE: Evaluation of both lower extremities were performed at rest, including calculation of ankle-brachial indices with single level Doppler, pressure and pulse volume recording. COMPARISON:  None. FINDINGS: Right ABI:  1.07 Left ABI:  1.02 Right Lower Extremity: Multiphasic waveform seen in the posterior tibial artery. Dorsalis pedis is monophasic. Left Lower Extremity: Multiphasic waveform seen in the posterior tibial and dorsalis pedis arteries. 1.0-1.4 Normal IMPRESSION: No definitive evidence significant lower extremity arterial occlusive disease. Electronically Signed   By: Miachel Roux M.D.   On: 11/14/2021 11:12   DG Foot 2 Views Right  Result Date: 11/13/2021 CLINICAL DATA:  Patient found down with  lesion on the right foot. EXAM: RIGHT FOOT - 2 VIEW COMPARISON:  None. FINDINGS: There is no evidence of an acute fracture or dislocation. Moderate severity degenerative changes are seen along the dorsal aspect of the mid right foot. Chronic appearing linear soft tissue calcifications are noted adjacent to the plantar aspect of the right calcaneus and along the insertion of the right Achilles tendon. IMPRESSION: No acute fracture or dislocation. Electronically Signed   By: Virgina Norfolk M.D.   On: 11/13/2021 20:55   ECHOCARDIOGRAM COMPLETE  Result Date: 11/14/2021    ECHOCARDIOGRAM REPORT   Patient Name:   Krista Payne Date of Exam: 11/14/2021 Medical Rec #:  [id number]      Height:       62.0 in Accession #:    [id number]     Weight:       218.0 lb Date of Birth:  [birth year]      BSA:          1.983 m Patient Age:    32 years       BP:           147/58 mmHg Patient Gender: F              HR:           77 bpm. Exam Location:  [facility] Procedure: 2D Echo, Cardiac Doppler and Color Doppler Indications:    Elevated Troponin  History:        Patient has no prior history of Echocardiogram examinations.                 Risk Factors:Hypertension, Diabetes and Dyslipidemia.  Sonographer:    Wenda Low Referring Phys: [phone] DAVID TAT  Sonographer Comments: COVID + IMPRESSIONS  1. Left ventricular ejection fraction, by estimation, is 70 to 75%. The left ventricle has hyperdynamic function. The left ventricle has no regional wall motion abnormalities. There is mild left ventricular hypertrophy. Left ventricular diastolic parameters are consistent with Grade I diastolic  dysfunction (impaired relaxation).  2. Right ventricular systolic function is normal. The right ventricular size is normal. There is normal pulmonary artery systolic pressure.  3. The mitral valve is normal in structure. No evidence of mitral valve regurgitation. No evidence of mitral stenosis.  4. The aortic valve is tricuspid. There is mild  calcification of the aortic valve. There is mild thickening of the aortic valve. Aortic valve regurgitation is not visualized. No aortic stenosis is present.  5. The inferior vena cava is normal in size with greater than 50% respiratory variability, suggesting right atrial pressure of 3 mmHg. FINDINGS  Left Ventricle: Left ventricular ejection fraction, by estimation, is 70 to 75%. The left ventricle has hyperdynamic function. The left ventricle has no regional wall motion abnormalities. The left ventricular internal cavity size was normal in size. There is mild left ventricular hypertrophy. Left ventricular diastolic parameters are consistent with Grade I diastolic dysfunction (impaired relaxation). Normal left ventricular filling pressure. Right Ventricle: The right ventricular size is normal. No increase in right ventricular wall thickness. Right ventricular systolic function is normal. There is normal pulmonary artery systolic pressure. The tricuspid regurgitant velocity is 2.50 m/s, and  with an assumed right atrial pressure of 3 mmHg, the estimated right ventricular systolic pressure is 87.5 mmHg. Left Atrium: Left atrial size was normal in size. Right Atrium: Right atrial size was normal in size. Pericardium: There is no evidence of pericardial effusion. Mitral Valve: The mitral valve is normal in structure. No evidence of mitral valve regurgitation. No evidence of mitral valve stenosis. MV peak gradient, 4.8 mmHg. The mean mitral valve gradient is 1.0 mmHg. Tricuspid Valve: The tricuspid valve is not well visualized. Tricuspid valve regurgitation is trivial. No evidence of tricuspid stenosis. Aortic Valve: The aortic valve is tricuspid. There is mild calcification of the aortic valve. There is mild thickening of the aortic valve. There is mild aortic valve annular calcification. Aortic valve regurgitation is not visualized. No aortic stenosis  is present. Aortic valve mean gradient measures 5.0 mmHg. Aortic  valve peak gradient measures 11.7 mmHg. Aortic valve area, by VTI measures 2.33 cm. Pulmonic Valve: The pulmonic valve was not well visualized. Pulmonic valve regurgitation is not visualized. No evidence of pulmonic stenosis. Aorta: The aortic root is normal in size and structure. Venous: The inferior vena cava is normal in size with greater than 50% respiratory variability, suggesting right atrial pressure of 3 mmHg. IAS/Shunts: The interatrial septum was not well visualized.  LEFT VENTRICLE PLAX 2D LVIDd:         4.10 cm     Diastology LVIDs:         2.30 cm     LV e' medial:    6.74 cm/s LV PW:         1.30 cm     LV E/e' medial:  10.2 LV IVS:        1.20 cm     LV e' lateral:   6.64 cm/s LVOT diam:     2.00 cm     LV E/e' lateral: 10.3 LV SV:         76 LV SV Index:   38 LVOT Area:     3.14 cm  LV Volumes (MOD) LV vol d, MOD A2C: 49.4 ml LV vol d, MOD A4C: 34.7 ml LV vol s, MOD A2C: 22.4 ml LV vol s, MOD A4C: 16.8 ml LV SV MOD A2C:     27.0 ml LV SV MOD A4C:  34.7 ml LV SV MOD BP:      24.3 ml RIGHT VENTRICLE RV Basal diam:  3.30 cm RV Mid diam:    2.50 cm RV S prime:     14.60 cm/s LEFT ATRIUM             Index        RIGHT ATRIUM           Index LA diam:        4.00 cm 2.02 cm/m   RA Area:     14.60 cm LA Vol (A2C):   48.1 ml 24.25 ml/m  RA Volume:   38.10 ml  19.21 ml/m LA Vol (A4C):   48.2 ml 24.30 ml/m LA Biplane Vol: 51.5 ml 25.97 ml/m  AORTIC VALVE                     PULMONIC VALVE AV Area (Vmax):    2.06 cm      PV Vmax:       0.97 m/s AV Area (Vmean):   1.76 cm      PV Peak grad:  3.7 mmHg AV Area (VTI):     2.33 cm AV Vmax:           171.00 cm/s AV Vmean:          103.000 cm/s AV VTI:            0.327 m AV Peak Grad:      11.7 mmHg AV Mean Grad:      5.0 mmHg LVOT Vmax:         112.00 cm/s LVOT Vmean:        57.700 cm/s LVOT VTI:          0.243 m LVOT/AV VTI ratio: 0.74  AORTA Ao Root diam: 2.60 cm Ao Asc diam:  3.00 cm MITRAL VALVE                TRICUSPID VALVE MV Area (PHT): 2.76 cm      TR Peak grad:   25.0 mmHg MV Area VTI:   2.89 cm     TR Vmax:        250.00 cm/s MV Peak grad:  4.8 mmHg MV Mean grad:  1.0 mmHg     SHUNTS MV Vmax:       1.09 m/s     Systemic VTI:  0.24 m MV Vmean:      54.0 cm/s    Systemic Diam: 2.00 cm MV Decel Time: 275 msec MV E velocity: 68.60 cm/s MV A velocity: 107.00 cm/s MV E/A ratio:  0.64 Carlyle Dolly MD Electronically signed by Carlyle Dolly MD Signature Date/Time: 11/14/2021/12:40:19 PM    Final     Procedures Procedures    Medications Ordered in ED Medications  vancomycin (VANCOREADY) IVPB 1500 mg/300 mL ( Intravenous Infusion Verify 11/13/21 2135)    Followed by  vancomycin (VANCOCIN) IVPB 1000 mg/200 mL premix (has no administration in time range)  enoxaparin (LOVENOX) injection 30 mg (30 mg Subcutaneous Given 11/14/21 2133)  0.9 %  sodium chloride infusion ( Intravenous Stopped 11/14/21 0920)  ascorbic acid (VITAMIN C) tablet 500 mg (500 mg Oral Given 11/14/21 0844)  zinc sulfate capsule 220 mg (220 mg Oral Given 11/14/21 0844)  amLODipine (NORVASC) tablet 5 mg (5 mg Oral Given 11/14/21 1105)  atenolol (TENORMIN) tablet 25 mg (25 mg Oral Given 11/14/21 0844)  simvastatin (ZOCOR) tablet 20 mg (20 mg Oral Given 11/14/21 2133)  ondansetron [facility]) injection 4 mg (has no administration in time range)  insulin aspart (novoLOG) injection 0-9 Units (3 Units Subcutaneous Given 11/14/21 1739)  insulin aspart (novoLOG) injection 0-5 Units (2 Units Subcutaneous Given 11/14/21 2134)  insulin glargine-yfgn (SEMGLEE) injection 5 Units (5 Units Subcutaneous Given 11/14/21 1105)  0.9 %  sodium chloride infusion (0 mLs Intravenous Stopped 11/14/21 2030)  molnupiravir EUA (LAGEVRIO) capsule 800 mg (800 mg Oral Given 11/14/21 2134)  collagenase (SANTYL) ointment 1 application (1 application Topical Given 11/14/21 1106)  sodium chloride 0.9 % bolus 2,000 mL (0 mLs Intravenous Stopped 11/13/21 2054)  ceFEPIme (MAXIPIME) 2 g in sodium chloride 0.9 % 100 mL IVPB (0  g Intravenous Stopped 11/13/21 2055)    ED Course/ Medical Decision Making/ A&P                           Medical Decision Making Amount and/or Complexity of Data Reviewed Labs: ordered. Radiology: ordered.  Risk Prescription drug management. Decision regarding hospitalization.   This patient presents to the ED for concern of altered mental status, this involves an extensive number of treatment options, and is a complaint that carries with it a high risk of complications and morbidity.  The differential diagnosis includes delirium, dehydration, sepsis, CVA, HHS, hypoglycemia, electrolyte disturbances, polypharmacy, drug withdrawal, traumatic head injury.   Co morbidities that complicate the patient evaluation  DM, CKD, HLD, HTN, arthritis.   Additional history obtained:  Additional history obtained from EMS, patient's daughter External records from outside source obtained and reviewed including EMR   Lab Tests:  I Ordered, and personally interpreted labs.  The pertinent results include: Normal electrolytes, baseline CKD, hyperglycemia without evidence of DKA, mild leukocytosis, elevation in lactic acid, elevation in CK and troponin, COVID-19 positive, UTI   Imaging Studies ordered:  I ordered imaging studies including CT of head and cervical spine.  X-ray of right foot I independently visualized and interpreted imaging which showed no acute intracranial abnormalities, no evidence of cervical spine injury, no evidence of osteomyelitis of right foot I agree with the radiologist interpretation   Cardiac Monitoring:  The patient was maintained on a cardiac monitor.  I personally viewed and interpreted the cardiac monitored which showed an underlying rhythm of: Sinus rhythm   Medicines ordered and prescription drug management:  I ordered medication including IV fluids for dehydration; broad-spectrum antibiotics for treatment of suspected sepsis Reevaluation of the patient  after these medicines showed that the patient improved I have reviewed the patients home medicines and have made adjustments as needed  Problem List / ED Course:  81 year old female who lives at home independently, presents by EMS after being found down on her bathroom floor.  Patient's daughter estimates that she may have been down on the floor for 36 hours.  On arrival, patient appears severely dehydrated.  She is acutely confused.  Physical exam is notable for pressure wound to lumbar spine as well as diffuse small scratches throughout her distal upper extremities.  Patient states that these are from her cat.  Appearance is not consistent with cat scratches.  Appearance is more consistent with possibly a fall into brambles outside.  Patient was given 2 L bolus of IV fluids on arrival.  On patient's lab work, she surprisingly does not appear to have an AKI.  She does have a mildly elevated lactic acid and leukocytosis and broad-spectrum antibiotics were initiated.  Additional lab findings showed an elevated CK and an  elevated troponin.  EKG was without evidence of ischemia.  Repeat lab work showed a downtrend.  Patient did have clinical improvement as well as normalized heart rate following IV fluids.  Additional history was obtained from patient's daughter, who did arrive and accompanied her at bedside.  Patient's daughter confirmed that patient was still not at her mental baseline.  Urinalysis does show acute infection.  Patient was admitted to hospitalist for further management.   Reevaluation:  After the interventions noted above, I reevaluated the patient and found that they have :improved   Social Determinants of Health:  Patient lives at home alone and is at risk of falls with prolonged downtime, as she presented with today.   Dispostion:  After consideration of the diagnostic results and the patients response to treatment, I feel that the patent would benefit from admission to  hospital.  CRITICAL CARE Performed by: Godfrey Pick   Total critical care time: 33 minutes  Critical care time was exclusive of separately billable procedures and treating other patients.  Critical care was necessary to treat or prevent imminent or life-threatening deterioration.  Critical care was time spent personally by me on the following activities: development of treatment plan with patient and/or surrogate as well as nursing, discussions with consultants, evaluation of patient's response to treatment, examination of patient, obtaining history from patient or surrogate, ordering and performing treatments and interventions, ordering and review of laboratory studies, ordering and review of radiographic studies, pulse oximetry and re-evaluation of patient's condition.           Final Clinical Impression(s) / ED Diagnoses Final diagnoses:  Altered mental status, unspecified altered mental status type  Dehydration  Urinary tract infection without hematuria, site unspecified  Lactic acidosis  Elevated troponin  Elevated CK    Rx / DC Orders ED Discharge Orders     None         Godfrey Pick, MD 11/15/21 0236

## 2021-11-13 NOTE — ED Triage Notes (Signed)
Pt brought in by [facility] from home. Daughter lives about 2 hours away and they haven't been able to reach pt since Friday so they called for a welfare check. EMS found pt on the bathroom floor. Pt is covered in urine with blood on her hands. Pt reports the blood is from her cat scratching her. BP 180/90, CBG 366. 20g IV in right ac.

## 2021-11-13 NOTE — Progress Notes (Signed)
Pharmacy Antibiotic Note  Krista Payne is a 81 y.o. female admitted on 11/13/2021 with AMS.  Pt was found down, last known well Friday 1/13.  Noted sacral and R foot wounds.  .  Pharmacy has been consulted for Vancomycin dosing for possible inffection.  Plan: Vancomycin 1500mg  IV x 1 Vancomycin 1gm IV q48h SCr 1.72, Vd 0.5, expected AUC 470 (goal 400-600) Cefepime 2gm IV x 1 dose in ED Follow-up abx plan / gram neg coverage  Height: 5\' 2"  (157.5 cm) Weight: 98.9 kg (218 lb 0.6 oz) IBW/kg (Calculated) : 50.1  Temp (24hrs), Avg:98.8 F (37.1 C), Min:98 F (36.7 C), Max:99.5 F (37.5 C)  Recent Labs  Lab 11/13/21 1639  WBC 10.9*  CREATININE 1.72*  LATICACIDVEN 2.3*    Estimated Creatinine Clearance: 28.7 mL/min (A) (by C-G formula based on SCr of 1.72 mg/dL (H)).    No Known Allergies  Antimicrobials this admission: Vanc 1/16 >>  Cefepime x 1 in ED 1/16   Dose adjustments this admission:   Microbiology results:  pending  Thank you for allowing pharmacy to be a part of this patients care.  Elmer Ramp 11/13/2021 8:28 PM

## 2021-11-13 NOTE — ED Notes (Signed)
Date and time results received: 11/13/21 2005 (use smartphrase ".now" to insert current time)  Test: trop Critical Value: 484  Name of Provider Notified: Dr. Doren Custard  Orders Received? Or Actions Taken?: na

## 2021-11-14 ENCOUNTER — Inpatient Hospital Stay (HOSPITAL_COMMUNITY): Payer: Medicare HMO

## 2021-11-14 ENCOUNTER — Other Ambulatory Visit (HOSPITAL_COMMUNITY): Payer: Self-pay | Admitting: *Deleted

## 2021-11-14 DIAGNOSIS — G9341 Metabolic encephalopathy: Secondary | ICD-10-CM

## 2021-11-14 DIAGNOSIS — E1165 Type 2 diabetes mellitus with hyperglycemia: Secondary | ICD-10-CM

## 2021-11-14 DIAGNOSIS — I248 Other forms of acute ischemic heart disease: Secondary | ICD-10-CM

## 2021-11-14 DIAGNOSIS — E1121 Type 2 diabetes mellitus with diabetic nephropathy: Secondary | ICD-10-CM

## 2021-11-14 DIAGNOSIS — R778 Other specified abnormalities of plasma proteins: Secondary | ICD-10-CM

## 2021-11-14 DIAGNOSIS — E11628 Type 2 diabetes mellitus with other skin complications: Secondary | ICD-10-CM

## 2021-11-14 DIAGNOSIS — N183 Chronic kidney disease, stage 3 unspecified: Secondary | ICD-10-CM

## 2021-11-14 DIAGNOSIS — L089 Local infection of the skin and subcutaneous tissue, unspecified: Secondary | ICD-10-CM

## 2021-11-14 DIAGNOSIS — N1832 Chronic kidney disease, stage 3b: Secondary | ICD-10-CM

## 2021-11-14 LAB — VITAMIN B12: Vitamin B-12: 2464 pg/mL — ABNORMAL HIGH (ref 180–914)

## 2021-11-14 LAB — COMPREHENSIVE METABOLIC PANEL
ALT: 32 U/L (ref 0–44)
AST: 51 U/L — ABNORMAL HIGH (ref 15–41)
Albumin: 3 g/dL — ABNORMAL LOW (ref 3.5–5.0)
Alkaline Phosphatase: 59 U/L (ref 38–126)
Anion gap: 10 (ref 5–15)
BUN: 55 mg/dL — ABNORMAL HIGH (ref 8–23)
CO2: 20 mmol/L — ABNORMAL LOW (ref 22–32)
Calcium: 8.3 mg/dL — ABNORMAL LOW (ref 8.9–10.3)
Chloride: 106 mmol/L (ref 98–111)
Creatinine, Ser: 1.66 mg/dL — ABNORMAL HIGH (ref 0.44–1.00)
GFR, Estimated: 31 mL/min — ABNORMAL LOW (ref >60)
Glucose, Bld: 309 mg/dL — ABNORMAL HIGH (ref 70–99)
Potassium: 3.9 mmol/L (ref 3.5–5.1)
Sodium: 136 mmol/L (ref 135–145)
Total Bilirubin: 0.5 mg/dL (ref 0.3–1.2)
Total Protein: 6.2 g/dL — ABNORMAL LOW (ref 6.5–8.1)

## 2021-11-14 LAB — MAGNESIUM: Magnesium: 1.8 mg/dL (ref 1.7–2.4)

## 2021-11-14 LAB — ECHOCARDIOGRAM COMPLETE
AR max vel: 2.06 cm2
AV Area VTI: 2.33 cm2
AV Area mean vel: 1.76 cm2
AV Mean grad: 5 mmHg
AV Peak grad: 11.7 mmHg
Ao pk vel: 1.71 m/s
Area-P 1/2: 2.76 cm2
Calc EF: 54.6 %
Height: 62 in
MV VTI: 2.89 cm2
S' Lateral: 2.3 cm
Single Plane A2C EF: 54.7 %
Single Plane A4C EF: 51.5 %
Weight: 3488.56 oz

## 2021-11-14 LAB — PROCALCITONIN: Procalcitonin: 1.46 ng/mL

## 2021-11-14 LAB — CBC
HCT: 38.1 % (ref 36.0–46.0)
Hemoglobin: 11.9 g/dL — ABNORMAL LOW (ref 12.0–15.0)
MCH: 28.8 pg (ref 26.0–34.0)
MCHC: 31.2 g/dL (ref 30.0–36.0)
MCV: 92.3 fL (ref 80.0–100.0)
Platelets: 318 10*3/uL (ref 150–400)
RBC: 4.13 MIL/uL (ref 3.87–5.11)
RDW: 13.3 % (ref 11.5–15.5)
WBC: 11.1 10*3/uL — ABNORMAL HIGH (ref 4.0–10.5)
nRBC: 0 % (ref 0.0–0.2)

## 2021-11-14 LAB — GLUCOSE, CAPILLARY
Glucose-Capillary: 232 mg/dL — ABNORMAL HIGH (ref 70–99)
Glucose-Capillary: 276 mg/dL — ABNORMAL HIGH (ref 70–99)
Glucose-Capillary: 249 mg/dL — ABNORMAL HIGH (ref 70–99)
Glucose-Capillary: 220 mg/dL — ABNORMAL HIGH (ref 70–99)

## 2021-11-14 LAB — FERRITIN: Ferritin: 255 ng/mL (ref 11–307)

## 2021-11-14 LAB — CK: Total CK: 1809 U/L — ABNORMAL HIGH (ref 38–234)

## 2021-11-14 LAB — HEMOGLOBIN A1C
Hemoglobin A1C: 9.8
Hgb A1c MFr Bld: 9.8 % — ABNORMAL HIGH (ref 4.8–5.6)
Mean Plasma Glucose: 234.56 mg/dL

## 2021-11-14 LAB — C-REACTIVE PROTEIN: CRP: 17.4 mg/dL — ABNORMAL HIGH (ref <1.0)

## 2021-11-14 LAB — PHOSPHORUS: Phosphorus: 3.8 mg/dL (ref 2.5–4.6)

## 2021-11-14 LAB — D-DIMER, QUANTITATIVE: D-Dimer, Quant: 2.01 ug/mL-FEU — ABNORMAL HIGH (ref 0.00–0.50)

## 2021-11-14 LAB — LACTIC ACID, PLASMA: Lactic Acid, Venous: 1.4 mmol/L (ref 0.5–1.9)

## 2021-11-14 IMAGING — MR MR FOOT*R* W/O CM
5 series · 40 of 40 positions shown · non-contrast
Comparison: Radiographs [DATE]

CLINICAL DATA: Diabetic foot pain and swelling.

EXAM:
MRI OF THE RIGHT FOREFOOT WITHOUT CONTRAST
TECHNIQUE: Multiplanar, multisequence MR imaging of the right foot was
performed. No intravenous contrast was administered.

[Series 3: T1 · coronal · right · 3.0mm · 0.44mm/px · 10 of 44 slices shown (1 of 2)]
[im 1/44]
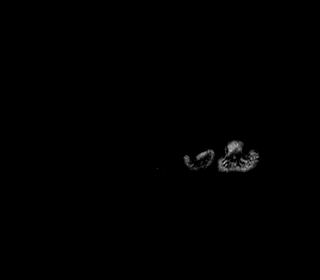
[im 5/44]
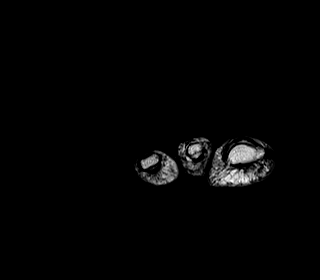
[im 10/44]
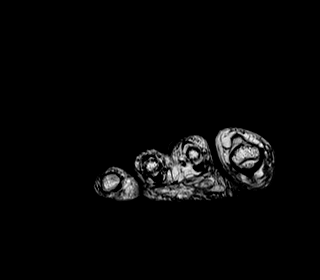
[im 15/44]
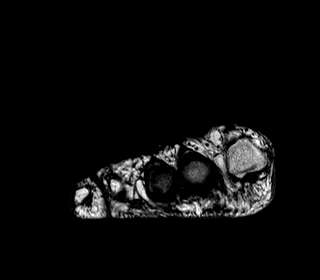
[im 20/44]
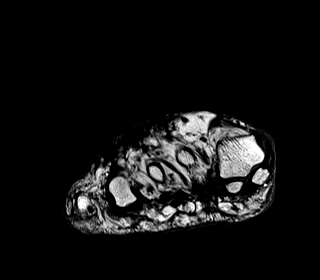
[im 24/44]
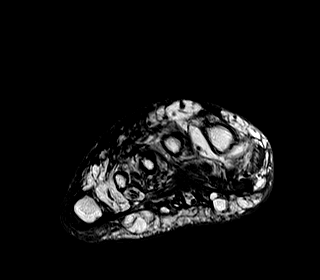
[im 29/44]
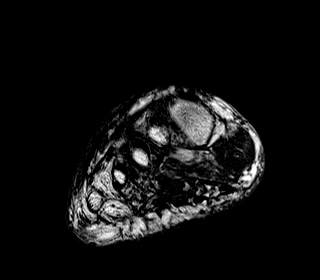
[im 34/44]
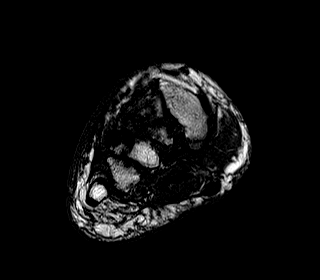
[im 39/44]
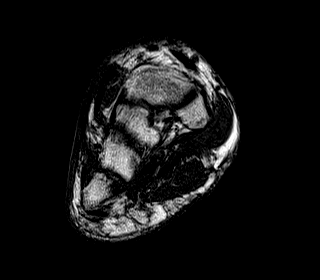
[im 44/44]
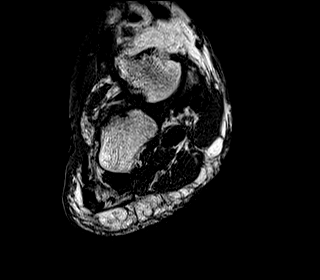

[Series 4: T2 fat-sat · coronal · right · 3.0mm · 0.38mm/px · 9 of 44 slices shown (1 of 2)]
[im 1/44]
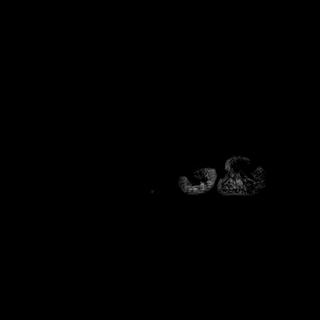
[im 6/44]
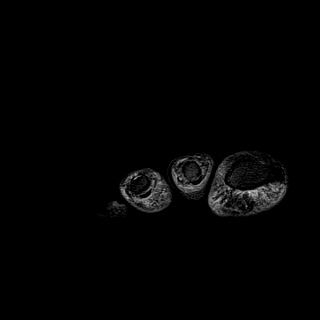
[im 11/44]
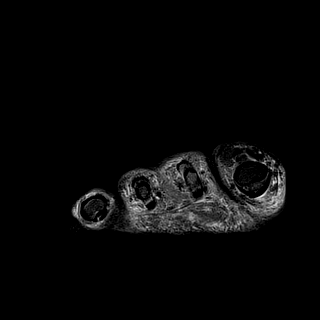
[im 17/44]
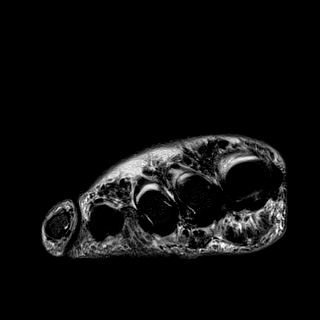
[im 22/44]
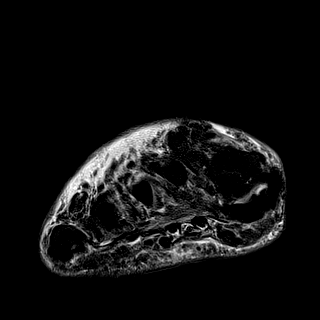
[im 27/44]
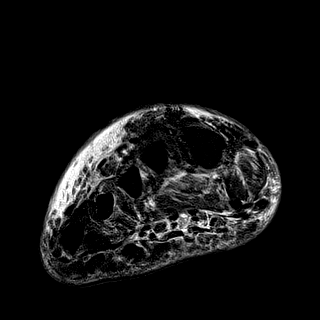
[im 33/44]
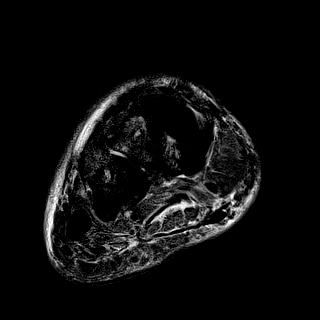
[im 38/44]
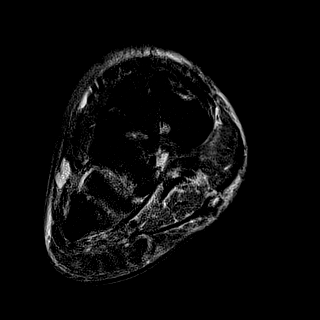
[im 44/44]
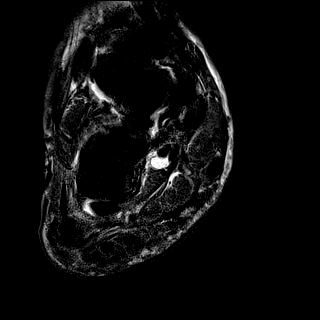

[Series 5: T1 · axial · right · 3.0mm · 0.62mm/px · z∈[-106,+9]mm · 7 of 34 slices shown (2 of 2)]
[im 1/34]
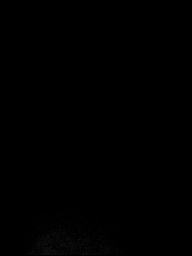
[im 6/34]
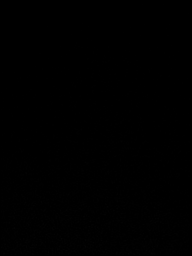
[im 12/34]
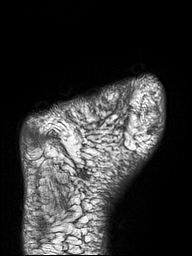
[im 17/34]
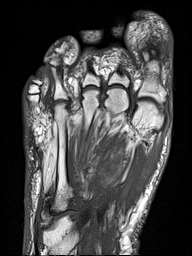
[im 23/34]
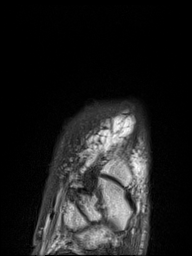
[im 28/34]
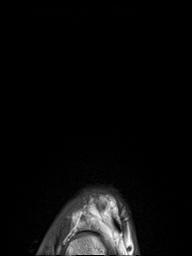
[im 34/34]
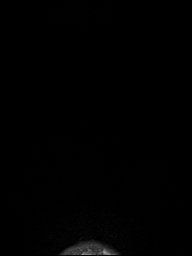

[Series 6: T2 fat-sat · axial · right · 3.0mm · 0.62mm/px · z∈[-110,+6]mm · 7 of 34 slices shown (2 of 2)]
[im 1/34]
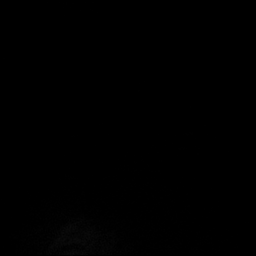
[im 6/34]
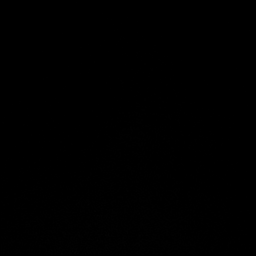
[im 12/34]
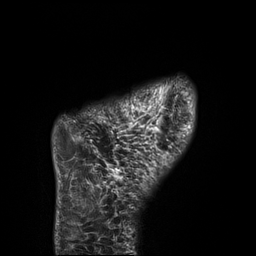
[im 17/34]
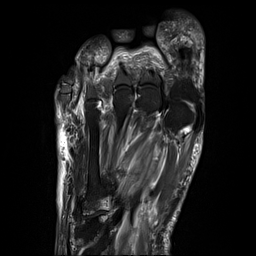
[im 23/34]
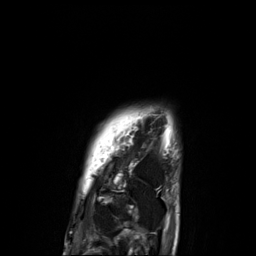
[im 28/34]
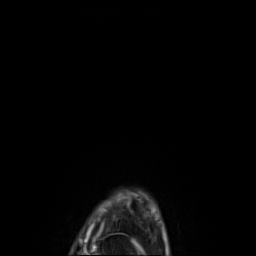
[im 34/34]
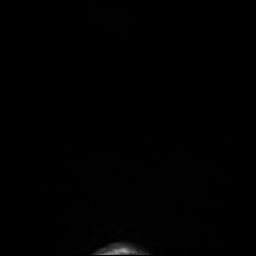

[Series 8: STIR · sagittal · right · 3.0mm · 0.62mm/px · 7 of 32 slices shown]
[im 1/32]
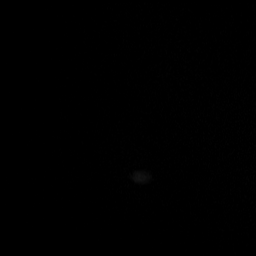
[im 6/32]
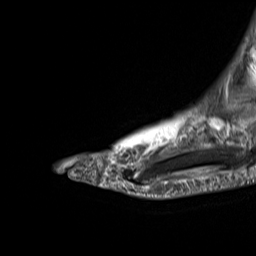
[im 11/32]
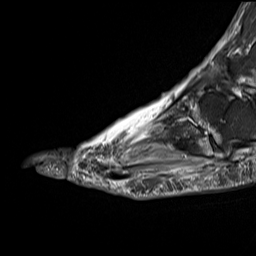
[im 16/32]
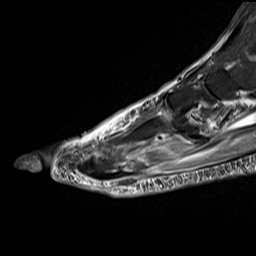
[im 21/32]
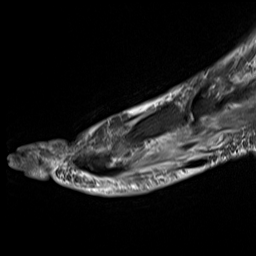
[im 26/32]
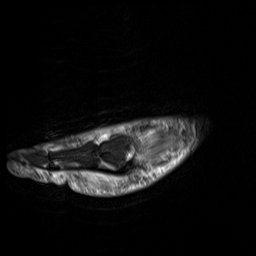
[im 32/32]
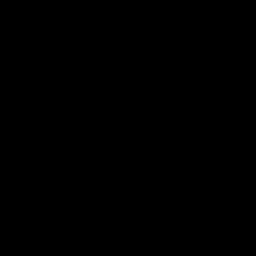

[40 of 40 positions shown; findings below may reference images not displayed]

FINDINGS: Diffuse subcutaneous soft tissue swelling/edema/fluid suggesting
cellulitis. No discrete fluid collection to suggest a drainable
abscess. Mild diffuse myofasciitis without evidence of pyomyositis.

Small MTP joint effusions but no findings suspicious for septic
arthritis or osteomyelitis.

Advanced midfoot degenerative changes with joint space narrowing,
small joint effusions and subchondral cystic change and edema.
Possible early neuropathic changes.
IMPRESSION: 1. Diffuse subcutaneous soft tissue swelling/edema/fluid suggesting
cellulitis.
2. Mild diffuse myofasciitis without evidence of pyomyositis.
3. No findings for septic arthritis or osteomyelitis.
4. Advanced midfoot degenerative changes.

## 2021-11-14 IMAGING — MR MR HEAD W/O CM
11 of 12 series · 40 of 48 positions shown · non-contrast
Comparison: CT head and cervical spine yesterday.

CLINICAL DATA: 80-year-old female with neurologic deficit. Altered
mental status. Found down.

EXAM:
MRI HEAD WITHOUT CONTRAST
TECHNIQUE: Multiplanar, multiecho pulse sequences of the brain and surrounding
structures were obtained without intravenous contrast.

[Series 5: DWI · axial · 4.0mm · 0.88mm/px · z∈[-85,+55]mm · 4 of 36 slices shown (1 of 6)]
[im 1/36]
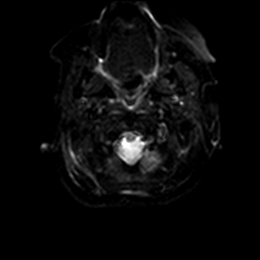
[im 12/36]
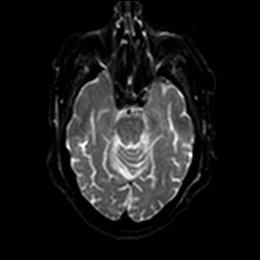
[im 24/36]
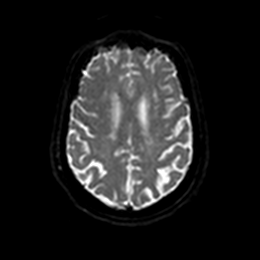
[im 36/36]
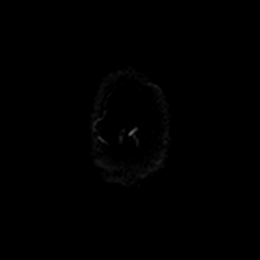

[Series 5: DWI · axial · 4.0mm · 0.88mm/px · z∈[-85,+55]mm · 4 of 36 slices shown (2 of 6)]
[im 1/36]
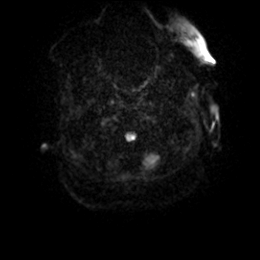
[im 12/36]
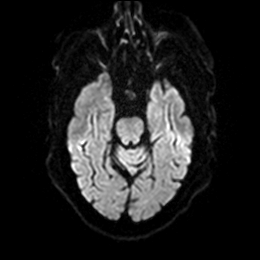
[im 24/36]
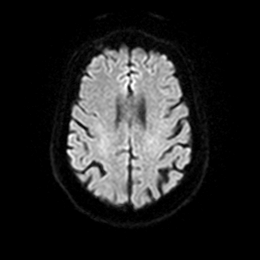
[im 36/36]
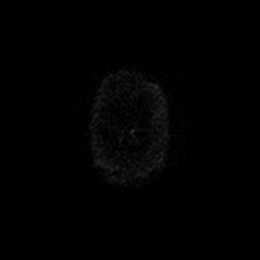

[Series 6: DWI · axial · 4.0mm · 0.88mm/px · z∈[-85,+55]mm · 4 of 36 slices shown (3 of 6)]
[im 1/36]
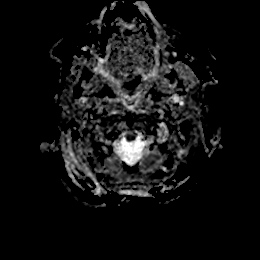
[im 12/36]
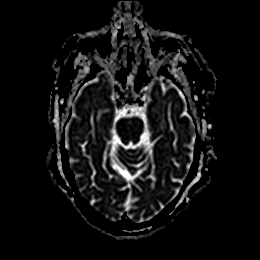
[im 24/36]
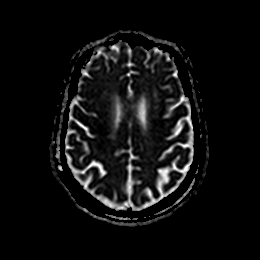
[im 36/36]
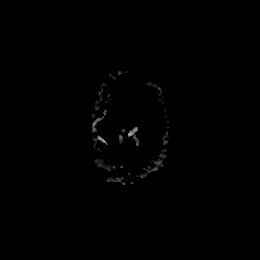

[Series 7: DWI · coronal · 5.0mm · 0.88mm/px · 3 of 28 slices shown (4 of 6)]
[im 1/28]
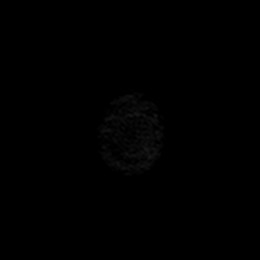
[im 14/28]
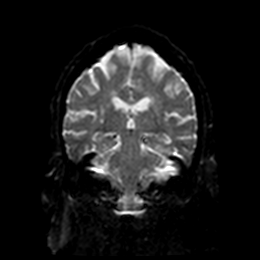
[im 28/28]
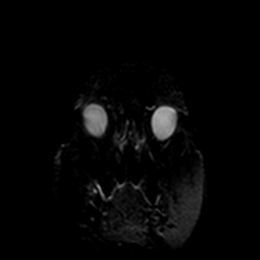

[Series 7: DWI · coronal · 5.0mm · 0.88mm/px · 4 of 28 slices shown (5 of 6)]
[im 1/28]
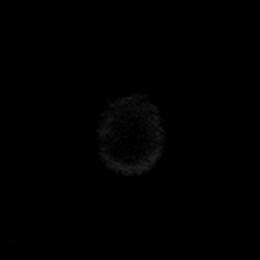
[im 10/28]
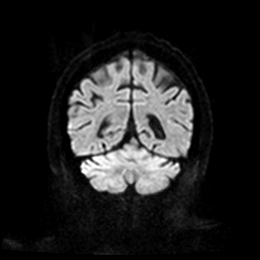
[im 19/28]
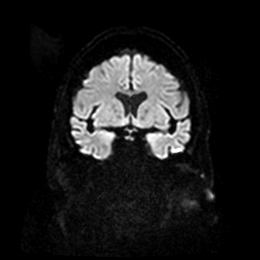
[im 28/28]
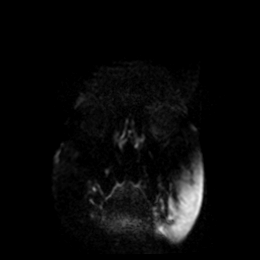

[Series 8: DWI · coronal · 5.0mm · 0.88mm/px · 4 of 28 slices shown (6 of 6)]
[im 1/28]
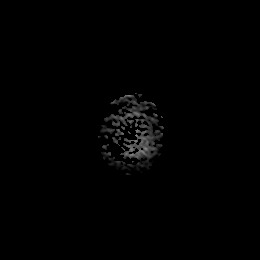
[im 10/28]
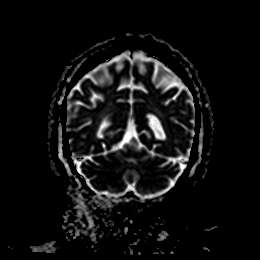
[im 19/28]
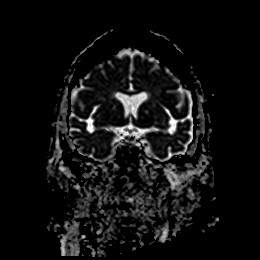
[im 28/28]
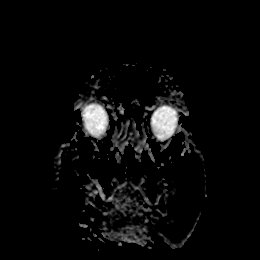

[Series 9: T1 · sagittal · 5.0mm · 0.94mm/px · 3 of 21 slices shown]
[im 1/21]
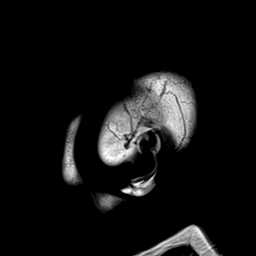
[im 11/21]
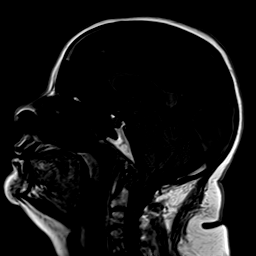
[im 21/21]
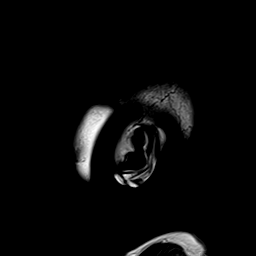

[Series 10: T2 · axial · 5.0mm · 0.72mm/px · z∈[-81,+51]mm · 3 of 20 slices shown (1 of 2)]
[im 1/20]
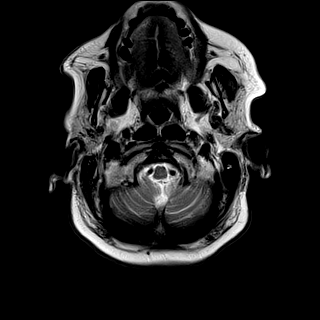
[im 10/20]
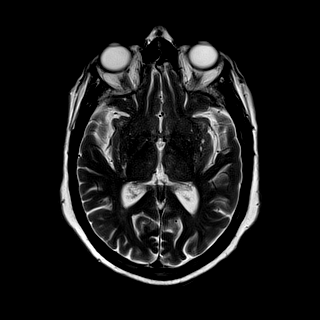
[im 20/20]
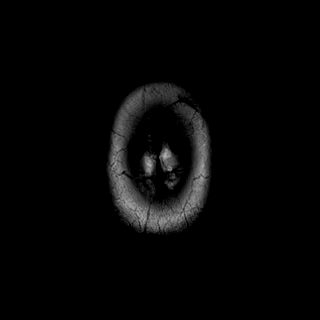

[Series 11: ax hemo · axial · 5.0mm · 0.86mm/px · z∈[-86,+57]mm · 3 of 25 slices shown]
[im 1/25]
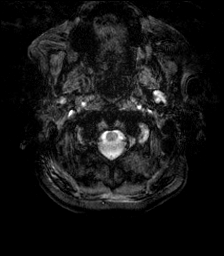
[im 13/25]
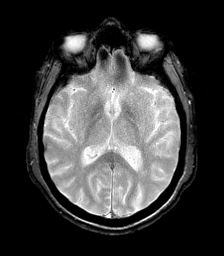
[im 25/25]
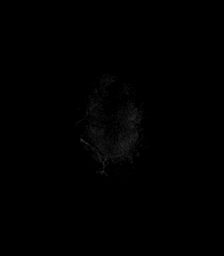

[Series 12: FLAIR · axial · 4.0mm · 0.43mm/px · z∈[-76,+47]mm · 4 of 32 slices shown]
[im 1/32]
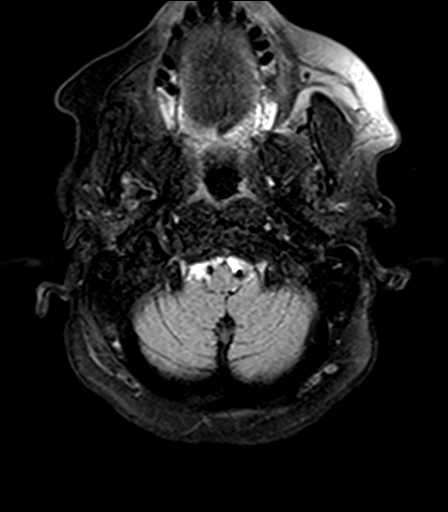
[im 11/32]
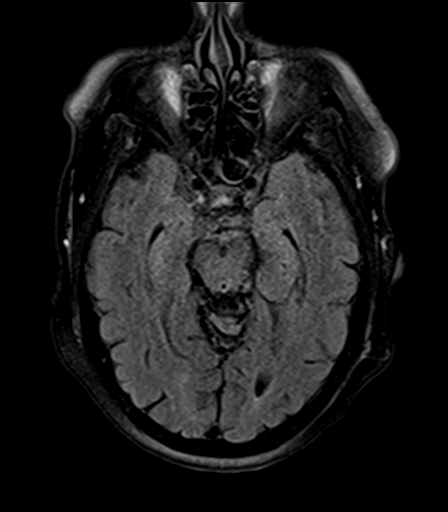
[im 21/32]
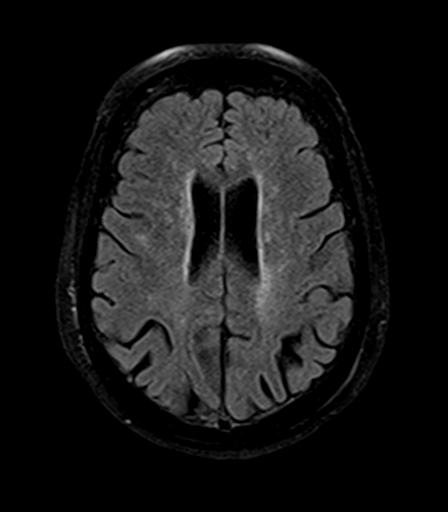
[im 32/32]
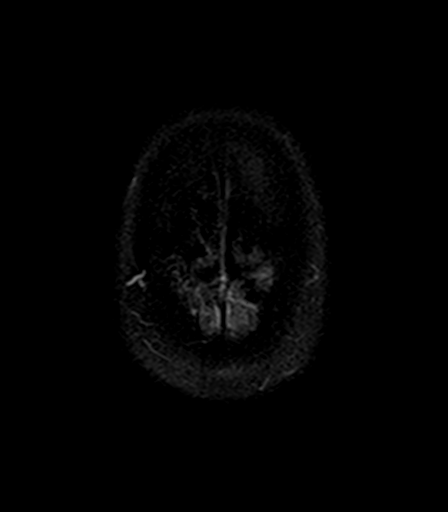

[Series 14: T2 · coronal · 5.0mm · 0.72mm/px · 4 of 28 slices shown (2 of 2)]
[im 1/28]
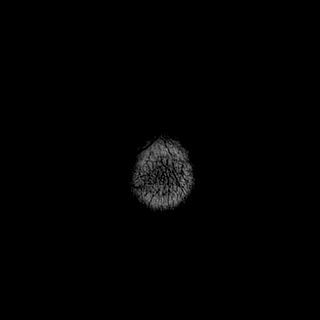
[im 10/28]
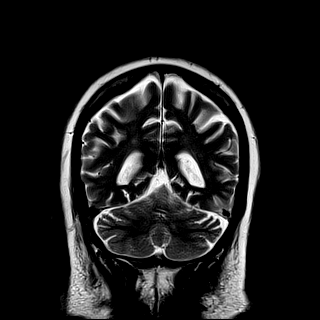
[im 19/28]
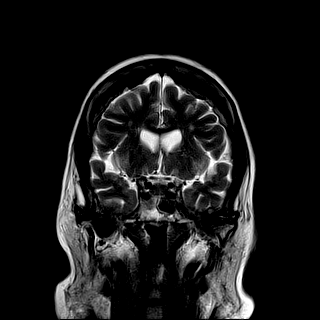
[im 28/28]
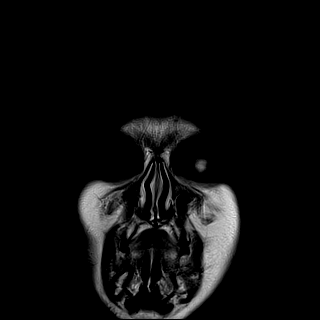

[40 of 48 positions shown; findings below may reference images not displayed]

FINDINGS: Brain: No restricted diffusion to suggest acute infarction. No
midline shift, mass effect, evidence of mass lesion,
ventriculomegaly, extra-axial collection or acute intracranial
hemorrhage. Cervicomedullary junction and pituitary are within
normal limits.

No cortical encephalomalacia or chronic cerebral blood products
identified. Mild for age patchy nonspecific white matter T2 and
FLAIR hyperintensity. T2 heterogeneity in the bilateral basal
ganglia appears in part related to perivascular spaces. Mild T2
heterogeneity in the thalami. Brainstem and cerebellum appear
negative.

Vascular: Major intracranial vascular flow voids are preserved.

Skull and upper cervical spine: Negative for age visible cervical
spine. Visualized bone marrow signal is within normal limits.

Sinuses/Orbits: Postoperative changes to the globes. Paranasal
Visualized paranasal sinuses and mastoids are stable and well
aerated.

Other: Grossly normal visible internal auditory structures. Trace
bilateral mastoid fluid. Small volume retained secretions in the
nasopharynx.
IMPRESSION: 1. No acute intracranial abnormality.
2. Mild for age signal changes in the brain compatible with chronic
small vessel disease.

## 2021-11-14 MED ORDER — COLLAGENASE 250 UNIT/GM EX OINT
1.0000 "application " | TOPICAL_OINTMENT | Freq: Every day | CUTANEOUS | Status: DC
  Administered 2021-11-14 – 2021-11-15 (×2): 1 via TOPICAL
  Filled 2021-11-14: qty 30

## 2021-11-14 MED ORDER — MOLNUPIRAVIR EUA 200MG CAPSULE
4.0000 | ORAL_CAPSULE | Freq: Two times a day (BID) | ORAL | Status: DC
  Administered 2021-11-14 – 2021-11-15 (×3): 800 mg via ORAL
  Filled 2021-11-14: qty 4

## 2021-11-14 MED ORDER — INSULIN GLARGINE-YFGN 100 UNIT/ML ~~LOC~~ SOLN
5.0000 [IU] | Freq: Every day | SUBCUTANEOUS | Status: DC
  Administered 2021-11-14 – 2021-11-15 (×2): 5 [IU] via SUBCUTANEOUS
  Filled 2021-11-14 (×3): qty 0.05

## 2021-11-14 MED ORDER — SODIUM CHLORIDE 0.9 % IV SOLN: INTRAVENOUS | Status: AC

## 2021-11-14 NOTE — Progress Notes (Signed)
PROGRESS NOTE  Krista Payne WVP:[id number] DOB: [birth year] DOA: 11/13/2021 PCP: Celene Squibb, MD  Brief History:  81 year old female with a history of diabetes mellitus type 2, CKD stage III, hypertension, hyperlipidemia, macular degeneration, anemia of chronic disease presenting with altered mental status and a fall.  The patient was found on the floor in the bathroom with feces and urine.  At baseline, the patient does not have any cognitive impairment and is completely independent with all her ADLs.  Apparently, the patient had called one of her friends on the evening of 11/11/2021 and said that she was not feeling well and would not be going discharged on 11/12/2021.  After that period of time, no one had heard from the patient, and they were not able to reach her by telephone.  Her daughter stop by the house on 11/13/2021 and found the patient in the bathroom straining with urine and feces.  Apparently the patient was awake but confused.  Daughter states that the patient normally is A&O x4, but very secretive regarding her health issues.  As such, daughter is unaware of any new medications or any newly diagnosed medical issues.  However, daughter does note that the patient has had some weakness in hands with decreased grip strength.  The patient herself denies any fevers, chills, headache, chest pain, short breath, nausea, vomiting, diarrhea, abdominal pain.  The patient does complain of a sore throat and a dry cough.  The duration is unclear. In the emergency department, the patient had low-grade temperature 9 9.5 F but was hemodynamically stable with oxygen saturation 95-97% on room air.  BMP showed sodium 136, potassium 3.9, serum creatinine 1.72.  AST 51, ALT 32, alk phosphatase 59, total bilirubin 0.5.  WBC 10.9, hemoglobin 14.0, platelets 362.  COVID-19 PCR was positive.  Lactic acid 2.3>> 1.4.  UA showed >50 WBC.  CT brain and CT cervical spine were negative.  Assessment/Plan: Acute  metabolic encephalopathy -Multifactorial including dehydration, COVID-19 infection/foot infection, UTI -B12 -TSH -CT brain negative -MRI brain given clinical history of hand weakness/grip strength weakness  UTI -UA >50 WBC -Unfortunately, urine culture was not obtained -Continue empiric cefepime  Diabetic foot infection -See pictures below -MRI right foot -Wound care consult -Check ABIs  COVID-19 infection -Check inflammatory markers -Start molnupiravir -stable on RA  Rhabdomyolysis -CPK 2534>> 1809 -Continue IV fluids -Repeat CPK in the morning  Elevated troponin -Secondary to demand ischemia -No chest pain presently -Echocardiogram  Lactic acidosis -Continue IV fluids -Lactate 2.3>> 1.4  CKD stage IIIb -Baseline creatinine 1.5-1.7 -Monitor BMP  Uncontrolled diabetes mellitus type 2 with hyperglycemia -NovoLog sliding scale -Start Semglee 5 units daily -11/14/2021 hemoglobin A1c 9.8 -Holding glipizide  Essential hypertension -Continue amlodipine and atenolol -Holding olmesartan  Hyperlipidemia -Continue statin  Transaminasemia -due to COVID-19 -no abd pain -trend LFTs  Morbid obesity -BMI 39.88 -Lifestyle modification                 Family Communication:   daughter updated 1/17  Consultants:  none  Code Status:  Payne  DVT Prophylaxis:[place] Lovenox   Procedures: As Listed in Progress Note Above  Antibiotics: Vanc 1/16>> Cefepime 1/16>>   Subjective: Patient denies fevers, chills, headache, chest pain, dyspnea, nausea, vomiting, diarrhea, abdominal pain, dysuria, hematuria, hematochezia, and melena.   Objective: Vitals:   11/13/21 2030 11/13/21 2130 11/13/21 2259 11/14/21 0332  BP: (!) 167/69 138/84 140/67 (!) 147/58  Pulse: 81 80 84 76  Resp: 14 (!) _0 Temp:   98.3 F (36.8 C) 98.5 F (36.9 C)  TempSrc:    Oral  SpO2: 96% 96% 97% 94%  Weight:      Height:        Intake/Output Summary (Last 24 hours)  at 11/14/2021 0829 Last data filed at 11/14/2021 0400 Gross per 24 hour  Intake 2447.8 ml  Output --  Net 2447.8 ml   Weight change:  Exam:  General:  Pt is alert, follows commands appropriately, not in acute distress HEENT: No icterus, No thrush, No neck mass, [place]/AT Cardiovascular: RRR, S1/S2, no rubs, no gallops Respiratory: bibasilar rales. No wheeze Abdomen: Soft/+BS, non tender, non distended, no guarding Extremities: 1 + LE edema, No lymphangitis, No petechiae, No rashes, no synovitis RIGHT FOOT     Data Reviewed: I have personally reviewed following labs and imaging studies Basic Metabolic Panel: Recent Labs  Lab 11/13/21 1639 11/14/21 0046  NA 136 136  K 3.7 3.9  CL 101 106  CO2 21* 20*  GLUCOSE 331* 309*  BUN 49* 55*  CREATININE 1.72* 1.66*  CALCIUM 9.3 8.3*  MG 1.9 1.8  PHOS  --  3.8   Liver Function Tests: Recent Labs  Lab 11/13/21 1639 11/14/21 0046  AST 69* 51*  ALT 38 32  ALKPHOS 74 59  BILITOT 0.7 0.5  PROT 7.4 6.2*  ALBUMIN 3.5 3.0*   No results for input(s): LIPASE, AMYLASE in the last 168 hours. No results for input(s): AMMONIA in the last 168 hours. Coagulation Profile: Recent Labs  Lab 11/13/21 1639  INR 1.1   CBC: Recent Labs  Lab 11/13/21 1639 11/14/21 0046  WBC 10.9* 11.1*  NEUTROABS 9.6*  --   HGB 14.0 11.9*  HCT 44.4 38.1  MCV 89.3 92.3  PLT 362 318   Cardiac Enzymes: Recent Labs  Lab 11/13/21 1639 11/14/21 0046  CKTOTAL 2,534* 1,809*   BNP: Invalid input(s): POCBNP CBG: Recent Labs  Lab 11/13/21 1703 11/13/21 2258 11/14/21 0712  GLUCAP 312* 298* 232*   HbA1C: Recent Labs    11/14/21 0046  HGBA1C 9.8*   Urine analysis:    Component Value Date/Time   COLORURINE YELLOW 11/13/2021 2059   APPEARANCEUR CLEAR 11/13/2021 2059   LABSPEC 1.025 11/13/2021 2059   PHURINE 6.5 11/13/2021 2059   GLUCOSEU 250 (A) 11/13/2021 2059   HGBUR MODERATE (A) 11/13/2021 2059   HGBUR negative 11/23/2010 1459    BILIRUBINUR NEGATIVE 11/13/2021 2059   BILIRUBINUR n 11/13/2011 1643   KETONESUR NEGATIVE 11/13/2021 2059   PROTEINUR 100 (A) 11/13/2021 2059   UROBILINOGEN 0.2 11/13/2011 1643   UROBILINOGEN 0.2 11/23/2010 1459   NITRITE NEGATIVE 11/13/2021 2059   LEUKOCYTESUR MODERATE (A) 11/13/2021 2059   Sepsis Labs: _1 (procalcitonin:4,lacticidven:4) ) Recent Results (from the past 240 hour(s))  Resp Panel by RT-PCR (Flu A&B, Covid) Nasopharyngeal Swab     Status: Abnormal   Collection Time: 11/13/21  4:48 PM   Specimen: Nasopharyngeal Swab; Nasopharyngeal(NP) swabs in vial transport medium  Result Value Ref Range Status   SARS Coronavirus 2 by RT PCR POSITIVE (A) NEGATIVE Final    Comment: (NOTE) SARS-CoV-2 target nucleic acids are DETECTED.  The SARS-CoV-2 RNA is generally detectable in upper respiratory specimens during the acute phase of infection. Positive results are indicative of the presence of the identified virus, but do not rule out bacterial infection or co-infection with other pathogens not detected by the test. Clinical correlation with patient history and other diagnostic  information is necessary to determine patient infection status. The expected result is Negative.  Fact Sheet for Patients: EntrepreneurPulse.com.au  Fact Sheet for Healthcare Providers: IncredibleEmployment.be  This test is not yet approved or cleared by the [place] FDA and  has been authorized for detection and/or diagnosis of SARS-CoV-2 by FDA under an Emergency Use Authorization (EUA).  This EUA will remain in effect (meaning this test can be used) for the duration of  the COVID-19 declaration under Section 564(b)(1) of the A ct, 21 U.S.C. section 360bbb-3(b)(1), unless the authorization is terminated or revoked sooner.     Influenza A by PCR NEGATIVE NEGATIVE Final   Influenza B by PCR NEGATIVE NEGATIVE Final    Comment: (NOTE) The Xpert Xpress  SARS-CoV-2/FLU/RSV plus assay is intended as an aid in the diagnosis of influenza from Nasopharyngeal swab specimens and should not be used as a sole basis for treatment. Nasal washings and aspirates are unacceptable for Xpert Xpress SARS-CoV-2/FLU/RSV testing.  Fact Sheet for Patients: EntrepreneurPulse.com.au  Fact Sheet for Healthcare Providers: IncredibleEmployment.be  This test is not yet approved or cleared by the [place] FDA and has been authorized for detection and/or diagnosis of SARS-CoV-2 by FDA under an Emergency Use Authorization (EUA). This EUA will remain in effect (meaning this test can be used) for the duration of the COVID-19 declaration under Section 564(b)(1) of the Act, 21 U.S.C. section 360bbb-3(b)(1), unless the authorization is terminated or revoked.  Performed at [facility], [address], [place], [place] [address]      Scheduled Meds:  amLODipine  5 mg Oral Daily   vitamin C  500 mg Oral Daily   atenolol  25 mg Oral Daily   enoxaparin (LOVENOX) injection  30 mg Subcutaneous Q24H   insulin aspart  0-5 Units Subcutaneous QHS   insulin aspart  0-9 Units Subcutaneous TID WC   insulin glargine-yfgn  5 Units Subcutaneous Daily   simvastatin  20 mg Oral QHS   zinc sulfate  220 mg Oral Daily   Continuous Infusions:  [START ON 11/15/2021] vancomycin      Procedures/Studies: CT HEAD WO CONTRAST  Result Date: 11/13/2021 CLINICAL DATA:  Mental status changes. Found down at home in the bathroom. EXAM: CT HEAD WITHOUT CONTRAST CT CERVICAL SPINE WITHOUT CONTRAST TECHNIQUE: Multidetector CT imaging of the head and cervical spine was performed following the standard protocol without intravenous contrast. Multiplanar CT image reconstructions of the cervical spine were also generated. RADIATION DOSE REDUCTION: This exam was performed according to the departmental dose-optimization program which includes automated exposure  control, adjustment of the mA and/or kV according to patient size and/or use of iterative reconstruction technique. COMPARISON:  None. FINDINGS: CT HEAD FINDINGS Brain: No evidence of acute infarction, hemorrhage, hydrocephalus, extra-axial collection or mass lesion/mass effect. Vascular: Minimal atherosclerotic calcification of the internal carotid arteries. No hyperdense vessels. Skull: No acute abnormality.  Hyperostosis frontalis. Sinuses/Orbits: No acute finding. Other: None CT CERVICAL SPINE FINDINGS Alignment: Normal. Skull base and vertebrae: No acute fracture. No primary bone lesion or focal pathologic process. Soft tissues and spinal canal: No prevertebral fluid or swelling. No visible canal hematoma. Disc levels:  Mild degenerative changes at C4-5, C5-6, and C6-7. Upper chest: Negative. Other: None IMPRESSION: 1.  No evidence for acute intracranial abnormality. 2.  No evidence for acute cervical spine abnormality. Electronically Signed   By: Nolon Nations M.D.   On: 11/13/2021 17:39   CT CERVICAL SPINE WO CONTRAST  Result Date: 11/13/2021 CLINICAL DATA:  Mental status changes. Found down at home in the bathroom. EXAM: CT HEAD WITHOUT CONTRAST CT CERVICAL SPINE WITHOUT CONTRAST TECHNIQUE: Multidetector CT imaging of the head and cervical spine was performed following the standard protocol without intravenous contrast. Multiplanar CT image reconstructions of the cervical spine were also generated. RADIATION DOSE REDUCTION: This exam was performed according to the departmental dose-optimization program which includes automated exposure control, adjustment of the mA and/or kV according to patient size and/or use of iterative reconstruction technique. COMPARISON:  None. FINDINGS: CT HEAD FINDINGS Brain: No evidence of acute infarction, hemorrhage, hydrocephalus, extra-axial collection or mass lesion/mass effect. Vascular: Minimal atherosclerotic calcification of the internal carotid arteries. No  hyperdense vessels. Skull: No acute abnormality.  Hyperostosis frontalis. Sinuses/Orbits: No acute finding. Other: None CT CERVICAL SPINE FINDINGS Alignment: Normal. Skull base and vertebrae: No acute fracture. No primary bone lesion or focal pathologic process. Soft tissues and spinal canal: No prevertebral fluid or swelling. No visible canal hematoma. Disc levels:  Mild degenerative changes at C4-5, C5-6, and C6-7. Upper chest: Negative. Other: None IMPRESSION: 1.  No evidence for acute intracranial abnormality. 2.  No evidence for acute cervical spine abnormality. Electronically Signed   By: Nolon Nations M.D.   On: 11/13/2021 17:39   DG Foot 2 Views Right  Result Date: 11/13/2021 CLINICAL DATA:  Patient found down with lesion on the right foot. EXAM: RIGHT FOOT - 2 VIEW COMPARISON:  None. FINDINGS: There is no evidence of an acute fracture or dislocation. Moderate severity degenerative changes are seen along the dorsal aspect of the mid right foot. Chronic appearing linear soft tissue calcifications are noted adjacent to the plantar aspect of the right calcaneus and along the insertion of the right Achilles tendon. IMPRESSION: No acute fracture or dislocation. Electronically Signed   By: Virgina Norfolk M.D.   On: 11/13/2021 20:55   MM DIAG BREAST TOMO BILATERAL  Result Date: 11/02/2021 CLINICAL DATA:  History grade 2 invasive lobular carcinoma, marked with an X clip, and diagnosed in August 2020. biopsy of asymmetry in the UPPER-OUTER RIGHT breast demonstrated atypical lobular hyperplasia and excision was recommended. Patient declined surgery. She takes anastrozole. EXAM: DIGITAL DIAGNOSTIC BILATERAL MAMMOGRAM WITH TOMOSYNTHESIS AND CAD TECHNIQUE: Bilateral digital diagnostic mammography and breast tomosynthesis was performed. The images were evaluated with computer-aided detection. COMPARISON:  Previous exam(s). ACR Breast Density Category b: There are scattered areas of fibroglandular density.  FINDINGS: Within the LOWER INNER QUADRANT of the RIGHT breast, there is vague density adjacent to the/X shaped clip in measuring approximately 5 millimeters. Asymmetry appears less masslike compared with multiple prior studies. A coil shaped clip is identified in the [place] of the RIGHT breast, unchanged. No new or suspicious findings in either breast. IMPRESSION: Smaller known malignancy in the LOWER INNER QUADRANT of RIGHT breast. No new findings. RECOMMENDATION: Treatment plan for known malignancy. Recommend annual diagnostic mammogram. I have discussed the findings and recommendations with the patient. If applicable, a reminder letter will be sent to the patient regarding the next appointment. BI-RADS CATEGORY  6: Known biopsy-proven malignancy. Electronically Signed   By: Nolon Nations M.D.   On: 11/02/2021 10:54   Orson Eva, DO  [facility]  If 7PM-7AM, please contact night-coverage [email or web address] Password [email or web address] 11/14/2021, 8:29 AM   LOS: 1 day

## 2021-11-14 NOTE — TOC Initial Note (Signed)
Transition of Care [facility]) - Initial/Assessment Note    Patient Details  Name: Krista Payne MRN: [id number] Date of Birth: 02-Nov-1940  [facility]) CM/SW Contact:    Salome Arnt, [place] Phone Number: 11/14/2021, 11:20 AM  Clinical Narrative:  Pt admitted due to rhabdomyolysis. LCSW completed assessment with pt's daughter as pt oriented to self only per chart. Pt's daughter reports pt lives alone and was pretty active at baseline. She was still driving. Pt fell at home over the weekend and daughter indicates she has been very confused since then. PT evaluated pt and recommend SNF. LCSW discussed with daughter and she is agreeable. Pt also found to be COVID +. Daughter is aware [facility] is only local facility accepting patients who are COVID +. Will initiate referral. [facility] currently has waiver in place for SNF. TOC will continue to follow.                   Expected Discharge Plan: Skilled Nursing Facility Barriers to Discharge: Continued Medical Work up   Patient Goals and CMS Choice Patient states their goals for this hospitalization and ongoing recovery are:: short term rehab   Choice offered to / list presented to : Adult Children  Expected Discharge Plan and Services Expected Discharge Plan: [place] In-house Referral: Clinical Social Work     Living arrangements for the past 2 months: Single Family Home                                      Prior Living Arrangements/Services Living arrangements for the past 2 months: Single Family Home Lives with:: Self Patient language and need for interpreter reviewed:: Yes        Need for Family Participation in Patient Care: Yes (Comment)     Criminal Activity/Legal Involvement Pertinent to Current Situation/Hospitalization: No - Comment as needed  Activities of Daily Living Home Assistive Devices/Equipment: None ADL Screening (condition at time of admission) Patient's cognitive  ability adequate to safely complete daily activities?: Yes Is the patient deaf or have difficulty hearing?: No Does the patient have difficulty seeing, even when wearing glasses/contacts?: No Does the patient have difficulty concentrating, remembering, or making decisions?: No Patient able to express need for assistance with ADLs?: Yes Does the patient have difficulty dressing or bathing?: No Independently performs ADLs?: Yes (appropriate for developmental age) Does the patient have difficulty walking or climbing stairs?: No Weakness of Legs: Both Weakness of Arms/Hands: None  Permission Sought/Granted                  Emotional Assessment   Attitude/Demeanor/Rapport: Unable to Assess Affect (typically observed): Unable to Assess Orientation: : Oriented to Self Alcohol / Substance Use: Not Applicable Psych Involvement: No (comment)  Admission diagnosis:  Rhabdomyolysis [M62.82] Patient Active Problem List   Diagnosis Date Noted   Diabetic foot infection ([place]) 56/31/4970   Acute metabolic encephalopathy 26/37/8588   CKD (chronic kidney disease) stage 3, GFR 30-59 ml/min ([place]) 11/14/2021   Uncontrolled type 2 diabetes mellitus with hyperglycemia, without long-term current use of insulin ([place]) 11/14/2021   Rhabdomyolysis 11/13/2021   Elevated troponin 11/13/2021   Lactic acidosis 11/13/2021   COVID-19 virus infection 11/13/2021   Hyperglycemia due to diabetes mellitus ([place]) 11/13/2021   Ankle wound, right, initial encounter 11/13/2021   Altered mental status 11/13/2021   Dehydration 11/13/2021   Chronic kidney disease  05/17/2021   Infiltrating lobular carcinoma of right breast in female [facility]) 07/16/2019   URI (upper respiratory infection) 11/20/2011   RENAL INSUFFICIENCY 10/26/2010   OSTEOARTHRITIS 10/18/2010   Vitamin D deficiency 08/10/2009   Mixed hyperlipidemia 08/10/2009   EXOGENOUS OBESITY 08/10/2009   EDEMA 01/20/2008   Essential hypertension 06/02/2007   PCP:   Celene Squibb, MD Pharmacy:   [facility], [address] [place] [address] Phone: [phone] Fax: [phone]  [facility] [place], [place] [address] [place] [address] Phone: [phone] Fax: [phone]     Social Determinants of Health (SDOH) Interventions    Readmission Risk Interventions No flowsheet data found.

## 2021-11-14 NOTE — Plan of Care (Signed)
°  Problem: Acute Rehab OT Goals (only OT should resolve) Goal: Pt. Will Perform Eating Flowsheets (Taken 11/14/2021 1001) Pt Will Perform Eating:  with modified independence  sitting Goal: Pt. Will Perform Grooming Flowsheets (Taken 11/14/2021 1001) Pt Will Perform Grooming:  standing  with modified independence  with adaptive equipment Goal: Pt. Will Perform Upper Body Bathing Flowsheets (Taken 11/14/2021 1001) Pt Will Perform Upper Body Bathing:  with modified independence  sitting Goal: Pt. Will Perform Lower Body Bathing Flowsheets (Taken 11/14/2021 1001) Pt Will Perform Lower Body Bathing:  with modified independence  sitting/lateral leans  with adaptive equipment Goal: Pt. Will Perform Lower Body Dressing Flowsheets (Taken 11/14/2021 1001) Pt Will Perform Lower Body Dressing:  with modified independence  sitting/lateral leans  with adaptive equipment Goal: Pt. Will Transfer To Toilet Flowsheets (Taken 11/14/2021 1001) Pt Will Transfer to Toilet:  with modified independence  stand pivot transfer Goal: Pt. Will Perform Toileting-Clothing Manipulation Flowsheets (Taken 11/14/2021 1001) Pt Will Perform Toileting - Clothing Manipulation and hygiene:  with modified independence  sit to/from stand  sitting/lateral leans  with adaptive equipment Goal: Pt/Caregiver Will Perform Home Exercise Program Flowsheets (Taken 11/14/2021 1001) Pt/caregiver will Perform Home Exercise Program:  Increased ROM  Increased strength  Both right and left upper extremity  Independently Chawn Spraggins OT, MOT

## 2021-11-14 NOTE — NC FL2 (Signed)
[place] LEVEL OF CARE SCREENING TOOL     IDENTIFICATION  Patient Name: Krista Payne Birthdate: 06/12/1941 Sex: female Admission Date (Current Location): 11/13/2021  [facility] and [place] Number:  [employer] and Address:  [place] [address] [place]      Provider Number: [id number]  Attending Physician Name and Address:  Orson Eva, MD  Relative Name and Phone Number:       Current Level of Care: Hospital Recommended Level of Care: [place] Prior Approval Number:    Date Approved/Denied:   PASRR Number: [id number]  Discharge Plan: SNF    Current Diagnoses: Patient Active Problem List   Diagnosis Date Noted   Diabetic foot infection ([place]) 40/07/2724   Acute metabolic encephalopathy 36/64/4034   CKD (chronic kidney disease) stage 3, GFR 30-59 ml/min ([place]) 11/14/2021   Uncontrolled type 2 diabetes mellitus with hyperglycemia, without long-term current use of insulin ([place]) 11/14/2021   Rhabdomyolysis 11/13/2021   Elevated troponin 11/13/2021   Lactic acidosis 11/13/2021   COVID-19 virus infection 11/13/2021   Hyperglycemia due to diabetes mellitus ([place]) 11/13/2021   Ankle wound, right, initial encounter 11/13/2021   Altered mental status 11/13/2021   Dehydration 11/13/2021   Chronic kidney disease 05/17/2021   Infiltrating lobular carcinoma of right breast in female ([place]) 07/16/2019   URI (upper respiratory infection) 11/20/2011   RENAL INSUFFICIENCY 10/26/2010   OSTEOARTHRITIS 10/18/2010   Vitamin D deficiency 08/10/2009   Mixed hyperlipidemia 08/10/2009   EXOGENOUS OBESITY 08/10/2009   EDEMA 01/20/2008   Essential hypertension 06/02/2007    Orientation RESPIRATION BLADDER Height & Weight     Self  Normal External catheter Weight: 218 lb 0.6 oz (98.9 kg) Height:  5\' 2"  (157.5 cm)  BEHAVIORAL SYMPTOMS/MOOD NEUROLOGICAL BOWEL NUTRITION STATUS      Incontinent Diet (Heart healthy/carb  modified. See d/c summary for updates.)  AMBULATORY STATUS COMMUNICATION OF NEEDS Skin   Extensive Assist Verbally Skin abrasions, Bruising, Other (Comment) (Excoriated hands. Rash to abdomen/groin. Pressure injury sacrum. Stage II left buttocks. Stage III right foot. Diabetic ulcer right foot. See wound care recommendations below.)                       Personal Care Assistance Level of Assistance  Bathing, Dressing, Feeding Bathing Assistance: Maximum assistance Feeding assistance: Limited assistance Dressing Assistance: Maximum assistance     Functional Limitations Info  Sight, Speech, Hearing Sight Info: Impaired Hearing Info: Adequate Speech Info: Adequate    SPECIAL CARE FACTORS FREQUENCY  PT (By licensed PT), OT (By licensed OT)     PT Frequency: 5x weekly OT Frequency: 5x weekly            Contractures      Additional Factors Info  Code Status, Allergies, Psychotropic, Isolation Precautions Code Status Info: Full code Allergies Info: No known allergies. Psychotropic Info: Wellbutrin   Isolation Precautions Info: Airborne/Contact precautions COVID + 11/13/21     Current Medications (11/14/2021):  This is the current hospital active medication list Current Facility-Administered Medications  Medication Dose Route Frequency Provider Last Rate Last Admin   0.9 %  sodium chloride infusion   Intravenous Continuous Tat, David, MD 100 mL/hr at 11/14/21 1115 New Bag at 11/14/21 1115   amLODipine (NORVASC) tablet 5 mg  5 mg Oral Daily Adefeso, Oladapo, DO   5 mg at 11/14/21 1105   ascorbic acid (VITAMIN C) tablet 500 mg  500 mg  Oral Daily Adefeso, Oladapo, DO   500 mg at 11/14/21 0844   atenolol (TENORMIN) tablet 25 mg  25 mg Oral Daily Adefeso, Oladapo, DO   25 mg at 11/14/21 0844   collagenase (SANTYL) ointment 1 application  1 application Topical Daily Tat, David, MD   1 application at 72/62/03 1106   enoxaparin (LOVENOX) injection 30 mg  30 mg Subcutaneous Q24H  Adefeso, Oladapo, DO   30 mg at 11/13/21 2334   insulin aspart (novoLOG) injection 0-5 Units  0-5 Units Subcutaneous QHS Adefeso, Oladapo, DO   3 Units at 11/13/21 2341   insulin aspart (novoLOG) injection 0-9 Units  0-9 Units Subcutaneous TID WC Adefeso, Oladapo, DO   3 Units at 11/14/21 0844   insulin glargine-yfgn (SEMGLEE) injection 5 Units  5 Units Subcutaneous Daily Tat, David, MD   5 Units at 11/14/21 1105   molnupiravir EUA (LAGEVRIO) capsule 800 mg  4 capsule Oral BID Tat, Shanon Brow, MD   800 mg at 11/14/21 1106   ondansetron (ZOFRAN) injection 4 mg  4 mg Intravenous Q6H PRN Adefeso, Oladapo, DO       simvastatin (ZOCOR) tablet 20 mg  20 mg Oral QHS Adefeso, Oladapo, DO   20 mg at 11/13/21 2333   [START ON 11/15/2021] vancomycin (VANCOCIN) IVPB 1000 mg/200 mL premix  1,000 mg Intravenous Q48H Adefeso, Oladapo, DO       zinc sulfate capsule 220 mg  220 mg Oral Daily Adefeso, Oladapo, DO   220 mg at 11/14/21 0844     Discharge Medications: Please see discharge summary for a list of discharge medications.  Relevant Imaging Results:  Relevant Lab Results:   Additional Information SSN: [id number]. Clean right foot wound with NS. Pat dry and apply a nickel thick layer of Santyl over the wound followed by a moistened saline gauze, dry gauze and foam dressing. Change daily.  Salome Arnt, LCSW

## 2021-11-14 NOTE — Progress Notes (Signed)
Inpatient Diabetes Program Recommendations  AACE/ADA: New Consensus Statement on Inpatient Glycemic Control   Target Ranges:  Prepandial:   less than 140 mg/dL      Peak postprandial:   less than 180 mg/dL (1-2 hours)      Critically ill patients:  140 - 180 mg/dL    Latest Reference Range & Units 11/13/21 17:03 11/13/21 22:58 11/14/21 07:12  Glucose-Capillary 70 - 99 mg/dL 312 (H) 298 (H) 232 (H)   Review of Glycemic Control  Diabetes history: DM2 Outpatient Diabetes medications: Janumet XR [phone] mg daily, Glipizide XL 5 mg daily Current orders for Inpatient glycemic control: Novolog 0-9 units TID with meals, Novolog 0-5 units QHS  Inpatient Diabetes Program Recommendations:    Insulin: Please consider ordering Semglee 5 units Q24H.  Thanks, Barnie Alderman, RN, MSN, CDE Diabetes Coordinator Inpatient Diabetes Program [phone] (Team Pager from 8am to 5pm)

## 2021-11-14 NOTE — Progress Notes (Signed)
*  PRELIMINARY RESULTS* Echocardiogram 2D Echocardiogram has been performed.  Elpidio Anis 11/14/2021, 12:20 PM

## 2021-11-14 NOTE — Plan of Care (Signed)
°  Problem: Acute Rehab PT Goals(only PT should resolve) Goal: Pt Will Go Supine/Side To Sit Outcome: Progressing Flowsheets (Taken 11/14/2021 1148) Pt will go Supine/Side to Sit:  with supervision  with min guard assist Goal: Patient Will Transfer Sit To/From Stand Outcome: Progressing Flowsheets (Taken 11/14/2021 1148) Patient will transfer sit to/from stand:  with min guard assist  with minimal assist Goal: Pt Will Transfer Bed To Chair/Chair To Bed Outcome: Progressing Flowsheets (Taken 11/14/2021 1148) Pt will Transfer Bed to Chair/Chair to Bed:  min guard assist  with min assist Goal: Pt Will Ambulate Outcome: Progressing Flowsheets (Taken 11/14/2021 1148) Pt will Ambulate:  50 feet  with minimal assist  with rolling walker   11:49 AM, 11/14/21 Lonell Grandchild, MPT Physical Therapist with [facility] [phone] office [phone] mobile phone

## 2021-11-14 NOTE — Progress Notes (Signed)
RIGHT FOOT

## 2021-11-14 NOTE — Consult Note (Signed)
[facility] Nurse Consult Note: Patient receiving care in [phone] Patient is currently in Ultrasound. Consult completed remotely after review of chart and images.  Reason for Consult:right foot wound Wound type: DG right foot results: There is no evidence of an acute fracture or dislocation. Moderate severity degenerative changes are seen along the dorsal aspect of the mid right foot. Chronic appearing linear soft tissue calcifications are noted adjacent to the plantar aspect of the right calcaneus and along the insertion of the right Achilles tendon. Pressure Injury POA: NA Measurement: see flow sheet Wound bed: 100% yellow Drainage (amount, consistency, odor)  Periwound: erythema Dressing procedure/placement/frequency: Clean the right foot wound with NS. Pat dry and apply a nickel thick layer of Santyl over the wound followed by a moistened saline gauze, dry gauze and foam dressing. Change daily.  Monitor the wound area(s) for worsening of condition such as: Signs/symptoms of infection, increase in size, development of or worsening of odor, development of pain, or increased pain at the affected locations.   Notify the medical team if any of these develop.  Thank you for the consult. [facility] nurse will not follow at this time.   Please re-consult the [place] team if needed.  Cathlean Marseilles Tamala Julian, MSN, RN, [place], Lysle Pearl, [facility] Wound Treatment Associate Pager [phone]

## 2021-11-14 NOTE — Evaluation (Signed)
Occupational Therapy Evaluation Patient Details Name: Krista Payne MRN: [id number] DOB: December 01, 1940 Today's Date: 11/14/2021   History of Present Illness Krista Payne is a 81 y.o. female with medical history significant for T2DM, CKD stage 3B, anemia of chronic disease, essential hypertension and hyperlipidemia who presents to the emergency department via EMS due to altered mental status.  Patient was unable to provide history, history was obtained from ED physician, ED medical record and daughter at bedside.  Per daughter, patient lives alone and was able to take care of her ADLs at baseline.  Patient was suspected to have sustained a fall while in the bathroom on Saturday night/early Sunday morning.  Daughter states that she was unable to reach the patient on the telephone since Friday, so a welfare check was called and patient was found on bathroom floor covered in urine and with blood on her hands.  Patient was then taken to the ED for further evaluation and management.   Clinical Impression   Pt reports independence at baseline. Pt agreeable to OT/PT co-evaluation today. Pt presents with generally B UE weakness and slow labored movement for bed mobility and transfer. Pt required use of RW this date with slow labored movement. Pt unable to don socks without total assist. Pt reports in the past week she has experienced increased vision issues in areas of finding items when eating. Pt has history of macular degeneration. Pt would need support for ADL's and transfers if she were to return home at this time. Pt will benefit from continued OT in the hospital and recommended venue below to increase strength, balance, and endurance for safe ADL's.        Recommendations for follow up therapy are one component of a multi-disciplinary discharge planning process, led by the attending physician.  Recommendations may be updated based on patient status, additional functional criteria and insurance  authorization.   Follow Up Recommendations  Skilled nursing-short term rehab (<3 hours/day)  ;  evaluation from an optometrist.    Assistance Recommended at Discharge Intermittent Supervision/Assistance  Patient can return home with the following A little help with walking and/or transfers;A lot of help with bathing/dressing/bathroom;Assistance with cooking/housework;Assist for transportation;Help with stairs or ramp for entrance    Functional Status Assessment  Patient has had a recent decline in their functional status and demonstrates the ability to make significant improvements in function in a reasonable and predictable amount of time.  Equipment Recommendations  None recommended by OT    Recommendations for Other Services       Precautions / Restrictions Precautions Precautions: Fall Restrictions Weight Bearing Restrictions: No      Mobility Bed Mobility Overal bed mobility: Needs Assistance Bed Mobility: Rolling, Sidelying to Sit Rolling: Min assist Sidelying to sit: Min assist, Mod assist       General bed mobility comments: Extended time, use of railing, slow labored movement.    Transfers Overall transfer level: Needs assistance   Transfers: Sit to/from Stand, Bed to chair/wheelchair/BSC Sit to Stand: Min assist, Min guard     Step pivot transfers: Min assist, Mod assist     General transfer comment: Slow labored movement with extended time using RW.      Balance Overall balance assessment: Needs assistance Sitting-balance support: No upper extremity supported, Feet supported Sitting balance-Leahy Scale: Good Sitting balance - Comments: sitting EOB   Standing balance support: Bilateral upper extremity supported, During functional activity, Reliant on assistive device for balance Standing balance-Leahy Scale: Fair Standing balance  comment: poor to fair with RW                           ADL either performed or assessed with clinical  judgement   ADL Overall ADL's : Needs assistance/impaired             Lower Body Bathing: Maximal assistance;Sitting/lateral leans Lower Body Bathing Details (indicate cue type and reason): Per clinical judgement. Upper Body Dressing : Set up;Sitting Upper Body Dressing Details (indicate cue type and reason): Donning gown Lower Body Dressing: Total assistance;Sitting/lateral leans Lower Body Dressing Details (indicate cue type and reason): Assisted donning socks per pt report that she could not do so. Toilet Transfer: Minimal assistance;Moderate assistance;Rolling walker (2 wheels) Toilet Transfer Details (indicate cue type and reason): Simulated via EOB to chair transfer with RW. Toileting- Clothing Manipulation and Hygiene: Maximal assistance;Sit to/from stand Toileting - Clothing Manipulation Details (indicate cue type and reason): Pt able to stand with RW while this OT completed peri-care for pt.     Functional mobility during ADLs: Minimal assistance;Moderate assistance;Rollator (4 wheels) General ADL Comments: Pt demonstrates slow labored movement with increased weakness from baseline reports.     Vision Baseline Vision/History: 6 Macular Degeneration Ability to See in Adequate Light: 2 Moderately impaired Patient Visual Report: Other (comment) (Pt reports increased difficulty locating objects when eating. Pt reports this has been the case for a week.) Vision Assessment?: Yes Tracking/Visual Pursuits: Able to track stimulus in all quads without difficulty Additional Comments: Pt able to read board and demonstrate good tracking. Possible increase in macular degeneration vission issues this past week. Recommend evaluation from optometrist.                Pertinent Vitals/Pain Pain Assessment Pain Assessment: No/denies pain     Hand Dominance Right   Extremity/Trunk Assessment Upper Extremity Assessment Upper Extremity Assessment: Generalized weakness   Lower  Extremity Assessment Lower Extremity Assessment: Defer to PT evaluation   Cervical / Trunk Assessment Cervical / Trunk Assessment: Normal   Communication Communication Communication: No difficulties   Cognition Arousal/Alertness: Awake/alert Behavior During Therapy: WFL for tasks assessed/performed Overall Cognitive Status: Within Functional Limits for tasks assessed                                                        Home Living Family/patient expects to be discharged to:: Private residence Living Arrangements: Alone Available Help at Discharge: Family;Friend(s);Available PRN/intermittently Type of Home: House Home Access: Stairs to enter [employer] of Steps: 2 Entrance Stairs-Rails: Right;Left;Can reach both Home Layout: One level     Bathroom Shower/Tub: Teacher, early years/pre: Standard     Home Equipment: Conservation officer, nature (2 wheels);Rollator (4 wheels);Cane - quad;Shower seat;Grab bars - tub/shower          Prior Functioning/Environment Prior Level of Function : Independent/Modified Independent             Mobility Comments: Pt reports ambulation without AD ADLs Comments: Pt reports independence with ADL's and IADL's.        OT Problem List: Decreased strength;Decreased activity tolerance;Decreased range of motion;Impaired balance (sitting and/or standing);Impaired vision/perception;Obesity      OT Treatment/Interventions: Self-care/ADL training;Therapeutic exercise;Therapeutic activities;Patient/family education;Balance training;Visual/perceptual remediation/compensation;DME and/or AE instruction    OT Goals(Current goals  can be found in the care plan section) Acute Rehab OT Goals Patient Stated Goal: return home OT Goal Formulation: With patient Time For Goal Achievement: 11/28/21 Potential to Achieve Goals: Fair  OT Frequency: Min 2X/week    Co-evaluation PT/OT/SLP Co-Evaluation/Treatment: Yes Reason  for Co-Treatment: To address functional/ADL transfers   OT goals addressed during session: ADL's and self-care                       End of Session Equipment Utilized During Treatment: Rolling walker (2 wheels)  Activity Tolerance: Patient tolerated treatment well Patient left: in chair;with call bell/phone within reach  OT Visit Diagnosis: Unsteadiness on feet (R26.81);Repeated falls (R29.6);Other abnormalities of gait and mobility (R26.89);Muscle weakness (generalized) (M62.81);History of falling (Z91.81)                Time: 6979-4801 OT Time Calculation (min): 25 min Charges:  OT General Charges $OT Visit: 1 Visit OT Evaluation $OT Eval Low Complexity: 1 Low  Chanita Boden OT, MOT  Larey Seat 11/14/2021, 9:58 AM

## 2021-11-14 NOTE — Progress Notes (Signed)
Writer obtained wound consult order for wounds to be assessed. Patient is stable no c/o pain, rested well overnight.

## 2021-11-14 NOTE — Evaluation (Signed)
Physical Therapy Evaluation Patient Details Name: Krista Payne MRN: [id number] DOB: 13-Jan-1941 Today's Date: 11/14/2021  History of Present Illness  Krista Payne is a 81 y.o. female with medical history significant for T2DM, CKD stage 3B, anemia of chronic disease, essential hypertension and hyperlipidemia who presents to the emergency department via EMS due to altered mental status.  Patient was unable to provide history, history was obtained from ED physician, ED medical record and daughter at bedside.  Per daughter, patient lives alone and was able to take care of her ADLs at baseline.  Patient was suspected to have sustained a fall while in the bathroom on Saturday night/early Sunday morning.  Daughter states that she was unable to reach the patient on the telephone since Friday, so a welfare check was called and patient was found on bathroom floor covered in urine and with blood on her hands.  Patient was then taken to the ED for further evaluation and management.   Clinical Impression  Patient demonstrates slow labored movement for sitting up at bedside, unable to complete sit to stands without AD due to BLE weakness, required use of RW and limited to a few slow labored unsteady side steps before having to sit due to c/o fatigue.  Patient tolerated sitting up in chair after therapy - nursing staff notified.  Patient will benefit from continued skilled physical therapy in hospital and recommended venue below to increase strength, balance, endurance for safe ADLs and gait.          Recommendations for follow up therapy are one component of a multi-disciplinary discharge planning process, led by the attending physician.  Recommendations may be updated based on patient status, additional functional criteria and insurance authorization.  Follow Up Recommendations Skilled nursing-short term rehab (<3 hours/day)    Assistance Recommended at Discharge Intermittent Supervision/Assistance  Patient  can return home with the following  A lot of help with walking and/or transfers;A little help with bathing/dressing/bathroom;Help with stairs or ramp for entrance    Equipment Recommendations None recommended by PT  Recommendations for Other Services       Functional Status Assessment Patient has had a recent decline in their functional status and demonstrates the ability to make significant improvements in function in a reasonable and predictable amount of time.     Precautions / Restrictions Precautions Precautions: Fall Restrictions Weight Bearing Restrictions: No      Mobility  Bed Mobility               General bed mobility comments: as per OT notes    Transfers Overall transfer level: Needs assistance Equipment used: Rolling walker (2 wheels) Transfers: Sit to/from Stand, Bed to chair/wheelchair/BSC Sit to Stand: Min assist, Min guard   Step pivot transfers: Mod assist       General transfer comment: Slow labored movement with extended time using RW.    Ambulation/Gait Ambulation/Gait assistance: Mod assist Gait Distance (Feet): 5 Feet Assistive device: Rolling walker (2 wheels) Gait Pattern/deviations: Decreased step length - right, Decreased step length - left, Decreased stride length, Trunk flexed Gait velocity: decreased     General Gait Details: limited to a few slow labored side steps due to BLE weakness and c/o fatigue  Stairs            Wheelchair Mobility    Modified Rankin (Stroke Patients Only)       Balance Overall balance assessment: Needs assistance Sitting-balance support: Feet supported, No upper extremity supported Sitting balance-Leahy Scale:  Good Sitting balance - Comments: sitting EOB   Standing balance support: Bilateral upper extremity supported, During functional activity, Reliant on assistive device for balance Standing balance-Leahy Scale: Fair Standing balance comment: fair/poor using RW                              Pertinent Vitals/Pain Pain Assessment Pain Assessment: No/denies pain    Home Living Family/patient expects to be discharged to:: Private residence Living Arrangements: Alone Available Help at Discharge: Family;Friend(s);Available PRN/intermittently Type of Home: House Home Access: Stairs to enter Entrance Stairs-Rails: Right;Left;Can reach both Entrance Stairs-Number of Steps: 2   Home Layout: One level Home Equipment: Conservation officer, nature (2 wheels);Rollator (4 wheels);Cane - quad;Shower seat;Grab bars - tub/shower      Prior Function Prior Level of Function : Independent/Modified Independent             Mobility Comments: Pt reports ambulation without AD ADLs Comments: Pt reports independence with ADL's and IADL's.     Hand Dominance   Dominant Hand: Right    Extremity/Trunk Assessment   Upper Extremity Assessment Upper Extremity Assessment: Defer to OT evaluation    Lower Extremity Assessment Lower Extremity Assessment: Generalized weakness    Cervical / Trunk Assessment Cervical / Trunk Assessment: Normal  Communication   Communication: No difficulties  Cognition Arousal/Alertness: Awake/alert Behavior During Therapy: WFL for tasks assessed/performed Overall Cognitive Status: Within Functional Limits for tasks assessed                                          General Comments      Exercises     Assessment/Plan    PT Assessment Patient needs continued PT services  PT Problem List Decreased strength;Decreased activity tolerance;Decreased balance       PT Treatment Interventions DME instruction;Gait training;Stair training;Functional mobility training;Therapeutic activities;Therapeutic exercise;Patient/family education;Balance training    PT Goals (Current goals can be found in the Care Plan section)  Acute Rehab PT Goals Patient Stated Goal: return home with family to assist PT Goal Formulation: With patient Time  For Goal Achievement: 11/28/21 Potential to Achieve Goals: Good    Frequency Min 3X/week     Co-evaluation PT/OT/SLP Co-Evaluation/Treatment: Yes Reason for Co-Treatment: To address functional/ADL transfers PT goals addressed during session: Mobility/safety with mobility;Balance;Proper use of DME OT goals addressed during session: ADL's and self-care       AM-PAC PT "6 Clicks" Mobility  Outcome Measure Help needed turning from your back to your side while in a flat bed without using bedrails?: A Little Help needed moving from lying on your back to sitting on the side of a flat bed without using bedrails?: A Lot Help needed moving to and from a bed to a chair (including a wheelchair)?: A Lot Help needed standing up from a chair using your arms (e.g., wheelchair or bedside chair)?: A Lot Help needed to walk in hospital room?: A Lot Help needed climbing 3-5 steps with a railing? : A Lot 6 Click Score: 13    End of Session   Activity Tolerance: Patient tolerated treatment well;Patient limited by fatigue Patient left: in chair;with call bell/phone within reach Nurse Communication: Mobility status PT Visit Diagnosis: Other abnormalities of gait and mobility (R26.89);Unsteadiness on feet (R26.81);Muscle weakness (generalized) (M62.81)    Time: 7425-9563 PT Time Calculation (min) (ACUTE ONLY): 32 min  Charges:   PT Evaluation $PT Eval Moderate Complexity: 1 Mod PT Treatments $Therapeutic Activity: 23-37 mins        11:46 AM, 11/14/21 Lonell Grandchild, MPT Physical Therapist with [facility] [phone] office [phone] mobile phone

## 2021-11-15 DIAGNOSIS — L89313 Pressure ulcer of right buttock, stage 3: Secondary | ICD-10-CM | POA: Diagnosis not present

## 2021-11-15 DIAGNOSIS — E860 Dehydration: Secondary | ICD-10-CM | POA: Diagnosis not present

## 2021-11-15 DIAGNOSIS — B351 Tinea unguium: Secondary | ICD-10-CM | POA: Diagnosis not present

## 2021-11-15 DIAGNOSIS — M6281 Muscle weakness (generalized): Secondary | ICD-10-CM | POA: Insufficient documentation

## 2021-11-15 DIAGNOSIS — G9341 Metabolic encephalopathy: Secondary | ICD-10-CM | POA: Diagnosis not present

## 2021-11-15 DIAGNOSIS — U071 COVID-19: Secondary | ICD-10-CM

## 2021-11-15 DIAGNOSIS — R2689 Other abnormalities of gait and mobility: Secondary | ICD-10-CM | POA: Insufficient documentation

## 2021-11-15 DIAGNOSIS — R778 Other specified abnormalities of plasma proteins: Secondary | ICD-10-CM

## 2021-11-15 DIAGNOSIS — L089 Local infection of the skin and subcutaneous tissue, unspecified: Secondary | ICD-10-CM | POA: Diagnosis not present

## 2021-11-15 DIAGNOSIS — K5900 Constipation, unspecified: Secondary | ICD-10-CM | POA: Diagnosis not present

## 2021-11-15 DIAGNOSIS — R69 Illness, unspecified: Secondary | ICD-10-CM | POA: Diagnosis not present

## 2021-11-15 DIAGNOSIS — I130 Hypertensive heart and chronic kidney disease with heart failure and stage 1 through stage 4 chronic kidney disease, or unspecified chronic kidney disease: Secondary | ICD-10-CM | POA: Insufficient documentation

## 2021-11-15 DIAGNOSIS — G4700 Insomnia, unspecified: Secondary | ICD-10-CM | POA: Insufficient documentation

## 2021-11-15 DIAGNOSIS — T796XXD Traumatic ischemia of muscle, subsequent encounter: Secondary | ICD-10-CM | POA: Diagnosis not present

## 2021-11-15 DIAGNOSIS — E11621 Type 2 diabetes mellitus with foot ulcer: Secondary | ICD-10-CM | POA: Diagnosis not present

## 2021-11-15 DIAGNOSIS — J302 Other seasonal allergic rhinitis: Secondary | ICD-10-CM | POA: Diagnosis not present

## 2021-11-15 DIAGNOSIS — E872 Acidosis, unspecified: Secondary | ICD-10-CM

## 2021-11-15 DIAGNOSIS — L8990 Pressure ulcer of unspecified site, unspecified stage: Secondary | ICD-10-CM | POA: Insufficient documentation

## 2021-11-15 DIAGNOSIS — I5030 Unspecified diastolic (congestive) heart failure: Secondary | ICD-10-CM | POA: Insufficient documentation

## 2021-11-15 DIAGNOSIS — E1165 Type 2 diabetes mellitus with hyperglycemia: Secondary | ICD-10-CM | POA: Diagnosis not present

## 2021-11-15 DIAGNOSIS — R410 Disorientation, unspecified: Secondary | ICD-10-CM

## 2021-11-15 DIAGNOSIS — I10 Essential (primary) hypertension: Secondary | ICD-10-CM | POA: Diagnosis not present

## 2021-11-15 DIAGNOSIS — L97519 Non-pressure chronic ulcer of other part of right foot with unspecified severity: Secondary | ICD-10-CM | POA: Diagnosis not present

## 2021-11-15 DIAGNOSIS — Z794 Long term (current) use of insulin: Secondary | ICD-10-CM | POA: Diagnosis not present

## 2021-11-15 DIAGNOSIS — K649 Unspecified hemorrhoids: Secondary | ICD-10-CM | POA: Diagnosis not present

## 2021-11-15 DIAGNOSIS — T796XXA Traumatic ischemia of muscle, initial encounter: Secondary | ICD-10-CM | POA: Insufficient documentation

## 2021-11-15 DIAGNOSIS — E11628 Type 2 diabetes mellitus with other skin complications: Secondary | ICD-10-CM | POA: Diagnosis not present

## 2021-11-15 DIAGNOSIS — M79674 Pain in right toe(s): Secondary | ICD-10-CM | POA: Diagnosis not present

## 2021-11-15 DIAGNOSIS — M79675 Pain in left toe(s): Secondary | ICD-10-CM | POA: Diagnosis not present

## 2021-11-15 DIAGNOSIS — F32A Depression, unspecified: Secondary | ICD-10-CM | POA: Insufficient documentation

## 2021-11-15 DIAGNOSIS — C50919 Malignant neoplasm of unspecified site of unspecified female breast: Secondary | ICD-10-CM | POA: Diagnosis not present

## 2021-11-15 DIAGNOSIS — Z7401 Bed confinement status: Secondary | ICD-10-CM | POA: Diagnosis not present

## 2021-11-15 DIAGNOSIS — M6282 Rhabdomyolysis: Secondary | ICD-10-CM | POA: Diagnosis not present

## 2021-11-15 DIAGNOSIS — L89323 Pressure ulcer of left buttock, stage 3: Secondary | ICD-10-CM | POA: Diagnosis not present

## 2021-11-15 DIAGNOSIS — E785 Hyperlipidemia, unspecified: Secondary | ICD-10-CM | POA: Diagnosis not present

## 2021-11-15 DIAGNOSIS — R279 Unspecified lack of coordination: Secondary | ICD-10-CM | POA: Diagnosis not present

## 2021-11-15 DIAGNOSIS — L03115 Cellulitis of right lower limb: Secondary | ICD-10-CM | POA: Diagnosis not present

## 2021-11-15 DIAGNOSIS — I248 Other forms of acute ischemic heart disease: Secondary | ICD-10-CM | POA: Insufficient documentation

## 2021-11-15 DIAGNOSIS — N390 Urinary tract infection, site not specified: Secondary | ICD-10-CM | POA: Diagnosis not present

## 2021-11-15 DIAGNOSIS — N1832 Chronic kidney disease, stage 3b: Secondary | ICD-10-CM | POA: Diagnosis not present

## 2021-11-15 LAB — COMPREHENSIVE METABOLIC PANEL
ALT: 30 U/L (ref 0–44)
AST: 46 U/L — ABNORMAL HIGH (ref 15–41)
Albumin: 2.7 g/dL — ABNORMAL LOW (ref 3.5–5.0)
Alkaline Phosphatase: 51 U/L (ref 38–126)
Anion gap: 11 (ref 5–15)
BUN: 68 mg/dL — ABNORMAL HIGH (ref 8–23)
CO2: 20 mmol/L — ABNORMAL LOW (ref 22–32)
Calcium: 8.7 mg/dL — ABNORMAL LOW (ref 8.9–10.3)
Chloride: 107 mmol/L (ref 98–111)
Creatinine, Ser: 1.64 mg/dL — ABNORMAL HIGH (ref 0.44–1.00)
GFR, Estimated: 31 mL/min — ABNORMAL LOW (ref >60)
Glucose, Bld: 159 mg/dL — ABNORMAL HIGH (ref 70–99)
Potassium: 3.4 mmol/L — ABNORMAL LOW (ref 3.5–5.1)
Sodium: 138 mmol/L (ref 135–145)
Total Bilirubin: 0.6 mg/dL (ref 0.3–1.2)
Total Protein: 6 g/dL — ABNORMAL LOW (ref 6.5–8.1)

## 2021-11-15 LAB — CBC
HCT: 35.1 % — ABNORMAL LOW (ref 36.0–46.0)
Hemoglobin: 11.2 g/dL — ABNORMAL LOW (ref 12.0–15.0)
MCH: 29.2 pg (ref 26.0–34.0)
MCHC: 31.9 g/dL (ref 30.0–36.0)
MCV: 91.6 fL (ref 80.0–100.0)
Platelets: 301 10*3/uL (ref 150–400)
RBC: 3.83 MIL/uL — ABNORMAL LOW (ref 3.87–5.11)
RDW: 13.5 % (ref 11.5–15.5)
WBC: 6.8 10*3/uL (ref 4.0–10.5)
nRBC: 0 % (ref 0.0–0.2)

## 2021-11-15 LAB — GLUCOSE, CAPILLARY
Glucose-Capillary: 183 mg/dL — ABNORMAL HIGH (ref 70–99)
Glucose-Capillary: 201 mg/dL — ABNORMAL HIGH (ref 70–99)
Glucose-Capillary: 198 mg/dL — ABNORMAL HIGH (ref 70–99)

## 2021-11-15 LAB — D-DIMER, QUANTITATIVE: D-Dimer, Quant: 1.77 ug/mL-FEU — ABNORMAL HIGH (ref 0.00–0.50)

## 2021-11-15 LAB — C-REACTIVE PROTEIN: CRP: 9 mg/dL — ABNORMAL HIGH (ref <1.0)

## 2021-11-15 LAB — MAGNESIUM: Magnesium: 2.1 mg/dL (ref 1.7–2.4)

## 2021-11-15 LAB — CK: Total CK: 1530 U/L — ABNORMAL HIGH (ref 38–234)

## 2021-11-15 LAB — FERRITIN: Ferritin: 224 ng/mL (ref 11–307)

## 2021-11-15 MED ORDER — SIMVASTATIN 20 MG PO TABS
20.0000 mg | ORAL_TABLET | Freq: Every day | ORAL | 3 refills | Status: AC
Start: 2021-11-22 — End: ?

## 2021-11-15 MED ORDER — ASCORBIC ACID 500 MG PO TABS: 500.0000 mg | ORAL_TABLET | Freq: Every day | ORAL | Status: DC

## 2021-11-15 MED ORDER — ZINC SULFATE 220 (50 ZN) MG PO CAPS: 220.0000 mg | ORAL_CAPSULE | Freq: Every day | ORAL | 0 refills | Status: AC

## 2021-11-15 MED ORDER — CEFDINIR 300 MG PO CAPS
300.0000 mg | ORAL_CAPSULE | Freq: Two times a day (BID) | ORAL | 0 refills | Status: AC
Start: 2021-11-15 — End: 2021-11-18

## 2021-11-15 MED ORDER — DOXYCYCLINE HYCLATE 100 MG PO CAPS: 100.0000 mg | ORAL_CAPSULE | Freq: Two times a day (BID) | ORAL | 0 refills | Status: AC

## 2021-11-15 MED ORDER — LEVOCETIRIZINE DIHYDROCHLORIDE 5 MG PO TABS: 5.0000 mg | ORAL_TABLET | Freq: Every day | ORAL | Status: DC | PRN

## 2021-11-15 MED ORDER — MOLNUPIRAVIR EUA 200MG CAPSULE: 4.0000 | ORAL_CAPSULE | Freq: Two times a day (BID) | ORAL | 0 refills | Status: AC

## 2021-11-15 NOTE — TOC Transition Note (Signed)
Transition of Care [facility]) - CM/SW Discharge Note   Patient Details  Name: Krista Payne MRN: [id number] Date of Birth: 1941/06/07  [facility] CM/SW Contact:  Ihor Gully, LCSW Phone Number: 11/15/2021, 1:47 PM   Clinical Narrative:    Discharge clinicals sent to facility. RN to call report. TOC signing off.   Final next level of care: Skilled Nursing Facility Barriers to Discharge: No Barriers Identified   Patient Goals and CMS Choice Patient states their goals for this hospitalization and ongoing recovery are:: short term rehab   Choice offered to / list presented to : Adult Children  Discharge Placement              Patient chooses bed at: Other - please specify in the comment section below: ([facility]) Patient to be transferred to facility by: [place] Name of family member notified: daughter, [name] Patient and family notified of of transfer: 11/15/21  Discharge Plan and Services In-house Referral: Clinical Social Work                                   Social Determinants of Health (SDOH) Interventions     Readmission Risk Interventions No flowsheet data found.

## 2021-11-15 NOTE — Discharge Summary (Addendum)
Physician Discharge Summary  Krista Payne EHU:[id number] DOB: 07/18/1941 DOA: 11/13/2021  PCP: Celene Squibb, MD  Admit date: 11/13/2021 Discharge date: 11/15/2021  Disposition: SN[facility]   Recommendations for Outpatient Follow-up:  Follow up with PCP in 2 weeks Please check BMP, CPK, CBC in 1 week to follow up  Discharge Condition: STABLE   CODE STATUS: FULL DIET: Heart healthy carb modified    Brief Hospitalization Summary: Please see all hospital notes, images, labs for full details of the hospitalization. Brief History:  81 year old female with a history of diabetes mellitus type 2, CKD stage III, hypertension, hyperlipidemia, macular degeneration, anemia of chronic disease presenting with altered mental status and a fall.  The patient was found on the floor in the bathroom with feces and urine.  At baseline, the patient does not have any cognitive impairment and is completely independent with all her ADLs.  Apparently, the patient had called one of her friends on the evening of 11/11/2021 and said that she was not feeling well and would not be going discharged on 11/12/2021.  After that period of time, no one had heard from the patient, and they were not able to reach her by telephone.  Her daughter stop by the house on 11/13/2021 and found the patient in the bathroom straining with urine and feces.  Apparently the patient was awake but confused.  Daughter states that the patient normally is A&O x4, but very secretive regarding her health issues.  As such, daughter is unaware of any new medications or any newly diagnosed medical issues.  However, daughter does note that the patient has had some weakness in hands with decreased grip strength.  The patient herself denies any fevers, chills, headache, chest pain, short breath, nausea, vomiting, diarrhea, abdominal pain.  The patient does complain of a sore throat and a dry cough.  The duration is unclear. In the emergency department, the  patient had low-grade temperature 9 9.5 F but was hemodynamically stable with oxygen saturation 95-97% on room air.  BMP showed sodium 136, potassium 3.9, serum creatinine 1.72.  AST 51, ALT 32, alk phosphatase 59, total bilirubin 0.5.  WBC 10.9, hemoglobin 14.0, platelets 362.  COVID-19 PCR was positive.  Lactic acid 2.3>> 1.4.  UA showed >50 WBC.  CT brain and CT cervical spine were negative.   Assessment/Plan: Acute metabolic encephalopathy - RESOLVED  -Multifactorial including dehydration, COVID-19 infection/foot infection, UTI -CT brain negative -MRI brain given clinical history of hand weakness/grip strength weakness: no acute findings    UTI   -UA >50 WBC -Unfortunately, urine culture was not obtained -treated with cefepime, DC on oral cefdinir x 3 days   Diabetic foot infection / cellulitis right foot  -See pictures below -MRI right foot: no osteo but cellulitis seen -Wound care consulted -Check ABIs: no signs of vascular compromise seen - treated with IV vanc / cefepime - doxycycline oral and cefdinir at discharge   COVID-19 infection - asymptomatic, incidental  -Check inflammatory markers -Started molnupiravir to complete full 5 day course given high risk for covid complications   -stable on RA - CXR with no infiltrates  Rhabdomyolysis -CPK 2534>> 1809>>1530 -Treated with IV fluids with good results    Elevated troponin -Secondary to demand ischemia -No chest pain presently -Echocardiogram: Left ventricular ejection fraction, by estimation, is 70 to 75%. The left ventricle has hyperdynamic function. The left ventricle has no regional wall motion abnormalities. There is mild left ventricular hypertrophy. Left ventricular diastolic parameters are  consistent with Grade I diastolic dysfunction (impaired relaxation).    Lactic acidosis -Continue IV fluids -Lactate 2.3>> 1.4   CKD stage IIIb -Baseline creatinine 1.5-1.7 -Monitor BMP   Uncontrolled diabetes mellitus  type 2 with hyperglycemia -NovoLog sliding scale in hospital  -Started Semglee 5 units daily in hospital only -11/14/2021 hemoglobin A1c 9.8 -resume glipizide at discharge - with her advanced age try to avoid hypoglycemia    Essential hypertension -Continue amlodipine and atenolol -Holding olmesartan   Hyperlipidemia -Continue statin   Transaminasemia -due to COVID-19 -no abd pain -trend LFTs   Morbid obesity -BMI 39.88 -Lifestyle modification    Family Communication:   daughter updated 1/17   Consultants:  none   Code Status:  FULL   DVT Prophylaxis:[place] Lovenox  Discharge Diagnoses:  Principal Problem:   Rhabdomyolysis Active Problems:   Mixed hyperlipidemia   Essential hypertension   Chronic kidney disease   Elevated troponin   Lactic acidosis   COVID-19 virus infection   Hyperglycemia due to diabetes mellitus (HCC)   Ankle wound, right, initial encounter   Altered mental status   Dehydration   Diabetic foot infection (HCC)   Acute metabolic encephalopathy   CKD (chronic kidney disease) stage 3, GFR 30-59 ml/min ([place])   Uncontrolled type 2 diabetes mellitus with hyperglycemia, without long-term current use of insulin (HCC)   Pressure injury of skin  Discharge Instructions:  Allergies as of 11/15/2021   No Known Allergies      Medication List     STOP taking these medications    triamcinolone ointment 0.5 % Commonly known as: KENALOG       TAKE these medications    amLODipine 5 MG tablet Commonly known as: NORVASC Take 5 mg by mouth daily.   anastrozole 1 MG tablet Commonly known as: ARIMIDEX Take 1 tablet (1 mg total) by mouth daily.   ascorbic acid 500 MG tablet Commonly known as: VITAMIN C Take 1 tablet (500 mg total) by mouth daily. Start taking on: November 16, 2021   atenolol 25 MG tablet Commonly known as: TENORMIN Take 25 mg by mouth daily.   buPROPion 150 MG 24 hr tablet Commonly known as: WELLBUTRIN XL Take 150 mg by  mouth daily.   cefdinir 300 MG capsule Commonly known as: OMNICEF Take 1 capsule (300 mg total) by mouth 2 (two) times daily for 3 days.   doxycycline 100 MG capsule Commonly known as: VIBRAMYCIN Take 1 capsule (100 mg total) by mouth 2 (two) times daily for 7 days.   furosemide 20 MG tablet Commonly known as: LASIX Take 10 mg by mouth daily.   glipiZIDE 5 MG 24 hr tablet Commonly known as: GLUCOTROL XL Take 5 mg by mouth daily.   levocetirizine 5 MG tablet Commonly known as: XYZAL Take 1 tablet (5 mg total) by mouth daily as needed for allergies. What changed:  when to take this reasons to take this   molnupiravir EUA 200 mg Caps capsule Commonly known as: LAGEVRIO Take 4 capsules (800 mg total) by mouth 2 (two) times daily for 5 days.   olmesartan 5 MG tablet Commonly known as: BENICAR Take 5 mg by mouth daily.   simvastatin 20 MG tablet Commonly known as: ZOCOR Take 1 tablet (20 mg total) by mouth at bedtime. Start taking on: November 22, 2021 What changed: These instructions start on November 22, 2021. If you are unsure what to do until then, ask your doctor or other care provider.   zinc sulfate  220 (50 Zn) MG capsule Take 1 capsule (220 mg total) by mouth daily. Start taking on: November 16, 2021        Follow-up Information     Celene Squibb, MD. Schedule an appointment as soon as possible for a visit in 2 week(s).   Specialty: Internal Medicine Why: Hospital Follow Up Contact information: [facility] [place] [address] [phone]                No Known Allergies Allergies as of 11/15/2021   No Known Allergies      Medication List     STOP taking these medications    triamcinolone ointment 0.5 % Commonly known as: KENALOG       TAKE these medications    amLODipine 5 MG tablet Commonly known as: NORVASC Take 5 mg by mouth daily.   anastrozole 1 MG tablet Commonly known as: ARIMIDEX Take 1 tablet (1 mg total) by  mouth daily.   ascorbic acid 500 MG tablet Commonly known as: VITAMIN C Take 1 tablet (500 mg total) by mouth daily. Start taking on: November 16, 2021   atenolol 25 MG tablet Commonly known as: TENORMIN Take 25 mg by mouth daily.   buPROPion 150 MG 24 hr tablet Commonly known as: WELLBUTRIN XL Take 150 mg by mouth daily.   cefdinir 300 MG capsule Commonly known as: OMNICEF Take 1 capsule (300 mg total) by mouth 2 (two) times daily for 3 days.   doxycycline 100 MG capsule Commonly known as: VIBRAMYCIN Take 1 capsule (100 mg total) by mouth 2 (two) times daily for 7 days.   furosemide 20 MG tablet Commonly known as: LASIX Take 10 mg by mouth daily.   glipiZIDE 5 MG 24 hr tablet Commonly known as: GLUCOTROL XL Take 5 mg by mouth daily.   levocetirizine 5 MG tablet Commonly known as: XYZAL Take 1 tablet (5 mg total) by mouth daily as needed for allergies. What changed:  when to take this reasons to take this   molnupiravir EUA 200 mg Caps capsule Commonly known as: LAGEVRIO Take 4 capsules (800 mg total) by mouth 2 (two) times daily for 5 days.   olmesartan 5 MG tablet Commonly known as: BENICAR Take 5 mg by mouth daily.   simvastatin 20 MG tablet Commonly known as: ZOCOR Take 1 tablet (20 mg total) by mouth at bedtime. Start taking on: November 22, 2021 What changed: These instructions start on November 22, 2021. If you are unsure what to do until then, ask your doctor or other care provider.   zinc sulfate 220 (50 Zn) MG capsule Take 1 capsule (220 mg total) by mouth daily. Start taking on: November 16, 2021        Procedures/Studies: CT HEAD WO CONTRAST  Result Date: 11/13/2021 CLINICAL DATA:  Mental status changes. Found down at home in the bathroom. EXAM: CT HEAD WITHOUT CONTRAST CT CERVICAL SPINE WITHOUT CONTRAST TECHNIQUE: Multidetector CT imaging of the head and cervical spine was performed following the standard protocol without intravenous contrast.  Multiplanar CT image reconstructions of the cervical spine were also generated. RADIATION DOSE REDUCTION: This exam was performed according to the departmental dose-optimization program which includes automated exposure control, adjustment of the mA and/or kV according to patient size and/or use of iterative reconstruction technique. COMPARISON:  None. FINDINGS: CT HEAD FINDINGS Brain: No evidence of acute infarction, hemorrhage, hydrocephalus, extra-axial collection or mass lesion/mass effect. Vascular: Minimal atherosclerotic calcification of the internal  carotid arteries. No hyperdense vessels. Skull: No acute abnormality.  Hyperostosis frontalis. Sinuses/Orbits: No acute finding. Other: None CT CERVICAL SPINE FINDINGS Alignment: Normal. Skull base and vertebrae: No acute fracture. No primary bone lesion or focal pathologic process. Soft tissues and spinal canal: No prevertebral fluid or swelling. No visible canal hematoma. Disc levels:  Mild degenerative changes at C4-5, C5-6, and C6-7. Upper chest: Negative. Other: None IMPRESSION: 1.  No evidence for acute intracranial abnormality. 2.  No evidence for acute cervical spine abnormality. Electronically Signed   By: Nolon Nations M.D.   On: 11/13/2021 17:39   CT CERVICAL SPINE WO CONTRAST  Result Date: 11/13/2021 CLINICAL DATA:  Mental status changes. Found down at home in the bathroom. EXAM: CT HEAD WITHOUT CONTRAST CT CERVICAL SPINE WITHOUT CONTRAST TECHNIQUE: Multidetector CT imaging of the head and cervical spine was performed following the standard protocol without intravenous contrast. Multiplanar CT image reconstructions of the cervical spine were also generated. RADIATION DOSE REDUCTION: This exam was performed according to the departmental dose-optimization program which includes automated exposure control, adjustment of the mA and/or kV according to patient size and/or use of iterative reconstruction technique. COMPARISON:  None. FINDINGS: CT HEAD  FINDINGS Brain: No evidence of acute infarction, hemorrhage, hydrocephalus, extra-axial collection or mass lesion/mass effect. Vascular: Minimal atherosclerotic calcification of the internal carotid arteries. No hyperdense vessels. Skull: No acute abnormality.  Hyperostosis frontalis. Sinuses/Orbits: No acute finding. Other: None CT CERVICAL SPINE FINDINGS Alignment: Normal. Skull base and vertebrae: No acute fracture. No primary bone lesion or focal pathologic process. Soft tissues and spinal canal: No prevertebral fluid or swelling. No visible canal hematoma. Disc levels:  Mild degenerative changes at C4-5, C5-6, and C6-7. Upper chest: Negative. Other: None IMPRESSION: 1.  No evidence for acute intracranial abnormality. 2.  No evidence for acute cervical spine abnormality. Electronically Signed   By: Nolon Nations M.D.   On: 11/13/2021 17:39   MR BRAIN WO CONTRAST  Result Date: 11/14/2021 CLINICAL DATA:  81 year old female with neurologic deficit. Altered mental status. Found down. EXAM: MRI HEAD WITHOUT CONTRAST TECHNIQUE: Multiplanar, multiecho pulse sequences of the brain and surrounding structures were obtained without intravenous contrast. COMPARISON:  CT head and cervical spine yesterday. FINDINGS: Brain: No restricted diffusion to suggest acute infarction. No midline shift, mass effect, evidence of mass lesion, ventriculomegaly, extra-axial collection or acute intracranial hemorrhage. Cervicomedullary junction and pituitary are within normal limits. No cortical encephalomalacia or chronic cerebral blood products identified. Mild for age patchy nonspecific white matter T2 and FLAIR hyperintensity. T2 heterogeneity in the bilateral basal ganglia appears in part related to perivascular spaces. Mild T2 heterogeneity in the thalami. Brainstem and cerebellum appear negative. Vascular: Major intracranial vascular flow voids are preserved. Skull and upper cervical spine: Negative for age visible cervical  spine. Visualized bone marrow signal is within normal limits. Sinuses/Orbits: Postoperative changes to the globes. Paranasal Visualized paranasal sinuses and mastoids are stable and well aerated. Other: Grossly normal visible internal auditory structures. Trace bilateral mastoid fluid. Small volume retained secretions in the nasopharynx. IMPRESSION: 1. No acute intracranial abnormality. 2. Mild for age signal changes in the brain compatible with chronic small vessel disease. Electronically Signed   By: Genevie Ann M.D.   On: 11/14/2021 10:07   MR FOOT RIGHT WO CONTRAST  Result Date: 11/14/2021 CLINICAL DATA:  Diabetic foot pain and swelling. EXAM: MRI OF THE RIGHT FOREFOOT WITHOUT CONTRAST TECHNIQUE: Multiplanar, multisequence MR imaging of the right foot was performed. No intravenous contrast was administered.  COMPARISON:  Radiographs 11/13/2021 FINDINGS: Diffuse subcutaneous soft tissue swelling/edema/fluid suggesting cellulitis. No discrete fluid collection to suggest a drainable abscess. Mild diffuse myofasciitis without evidence of pyomyositis. Small MTP joint effusions but no findings suspicious for septic arthritis or osteomyelitis. Advanced midfoot degenerative changes with joint space narrowing, small joint effusions and subchondral cystic change and edema. Possible early neuropathic changes. IMPRESSION: 1. Diffuse subcutaneous soft tissue swelling/edema/fluid suggesting cellulitis. 2. Mild diffuse myofasciitis without evidence of pyomyositis. 3. No findings for septic arthritis or osteomyelitis. 4. Advanced midfoot degenerative changes. Electronically Signed   By: Marijo Sanes M.D.   On: 11/14/2021 10:34   US ARTERIAL ABI (SCREENING LOWER EXTREMITY)  Result Date: 11/14/2021 CLINICAL DATA:  Nonhealing right foot ulcer EXAM: NONINVASIVE PHYSIOLOGIC VASCULAR STUDY OF BILATERAL LOWER EXTREMITIES TECHNIQUE: Evaluation of both lower extremities were performed at rest, including calculation of ankle-brachial  indices with single level Doppler, pressure and pulse volume recording. COMPARISON:  None. FINDINGS: Right ABI:  1.07 Left ABI:  1.02 Right Lower Extremity: Multiphasic waveform seen in the posterior tibial artery. Dorsalis pedis is monophasic. Left Lower Extremity: Multiphasic waveform seen in the posterior tibial and dorsalis pedis arteries. 1.0-1.4 Normal IMPRESSION: No definitive evidence significant lower extremity arterial occlusive disease. Electronically Signed   By: Miachel Roux M.D.   On: 11/14/2021 11:12   DG Foot 2 Views Right  Result Date: 11/13/2021 CLINICAL DATA:  Patient found down with lesion on the right foot. EXAM: RIGHT FOOT - 2 VIEW COMPARISON:  None. FINDINGS: There is no evidence of an acute fracture or dislocation. Moderate severity degenerative changes are seen along the dorsal aspect of the mid right foot. Chronic appearing linear soft tissue calcifications are noted adjacent to the plantar aspect of the right calcaneus and along the insertion of the right Achilles tendon. IMPRESSION: No acute fracture or dislocation. Electronically Signed   By: Virgina Norfolk M.D.   On: 11/13/2021 20:55   MM DIAG BREAST TOMO BILATERAL  Result Date: 11/02/2021 CLINICAL DATA:  History grade 2 invasive lobular carcinoma, marked with an X clip, and diagnosed in August 2020. biopsy of asymmetry in the UPPER-OUTER RIGHT breast demonstrated atypical lobular hyperplasia and excision was recommended. Patient declined surgery. She takes anastrozole. EXAM: DIGITAL DIAGNOSTIC BILATERAL MAMMOGRAM WITH TOMOSYNTHESIS AND CAD TECHNIQUE: Bilateral digital diagnostic mammography and breast tomosynthesis was performed. The images were evaluated with computer-aided detection. COMPARISON:  Previous exam(s). ACR Breast Density Category b: There are scattered areas of fibroglandular density. FINDINGS: Within the LOWER INNER QUADRANT of the RIGHT breast, there is vague density adjacent to the/X shaped clip in measuring  approximately 5 millimeters. Asymmetry appears less masslike compared with multiple prior studies. A coil shaped clip is identified in the [place] of the RIGHT breast, unchanged. No new or suspicious findings in either breast. IMPRESSION: Smaller known malignancy in the LOWER INNER QUADRANT of RIGHT breast. No new findings. RECOMMENDATION: Treatment plan for known malignancy. Recommend annual diagnostic mammogram. I have discussed the findings and recommendations with the patient. If applicable, a reminder letter will be sent to the patient regarding the next appointment. BI-RADS CATEGORY  6: Known biopsy-proven malignancy. Electronically Signed   By: Nolon Nations M.D.   On: 11/02/2021 10:54  ECHOCARDIOGRAM COMPLETE  Result Date: 11/14/2021    ECHOCARDIOGRAM REPORT   Patient Name:   DELORSE SHANE Date of Exam: 11/14/2021 Medical Rec #:  [id number]      Height:       62.0 in Accession #:  [id number]     Weight:       218.0 lb Date of Birth:  03/12/1941      BSA:          1.983 m Patient Age:    72 years       BP:           147/58 mmHg Patient Gender: F              HR:           77 bpm. Exam Location:  [facility] Procedure: 2D Echo, Cardiac Doppler and Color Doppler Indications:    Elevated Troponin  History:        Patient has no prior history of Echocardiogram examinations.                 Risk Factors:Hypertension, Diabetes and Dyslipidemia.  Sonographer:    Wenda Low Referring Phys: [phone] DAVID TAT  Sonographer Comments: COVID + IMPRESSIONS  1. Left ventricular ejection fraction, by estimation, is 70 to 75%. The left ventricle has hyperdynamic function. The left ventricle has no regional wall motion abnormalities. There is mild left ventricular hypertrophy. Left ventricular diastolic parameters are consistent with Grade I diastolic dysfunction (impaired relaxation).  2. Right ventricular systolic function is normal. The right ventricular size is normal. There is normal pulmonary artery  systolic pressure.  3. The mitral valve is normal in structure. No evidence of mitral valve regurgitation. No evidence of mitral stenosis.  4. The aortic valve is tricuspid. There is mild calcification of the aortic valve. There is mild thickening of the aortic valve. Aortic valve regurgitation is not visualized. No aortic stenosis is present.  5. The inferior vena cava is normal in size with greater than 50% respiratory variability, suggesting right atrial pressure of 3 mmHg. FINDINGS  Left Ventricle: Left ventricular ejection fraction, by estimation, is 70 to 75%. The left ventricle has hyperdynamic function. The left ventricle has no regional wall motion abnormalities. The left ventricular internal cavity size was normal in size. There is mild left ventricular hypertrophy. Left ventricular diastolic parameters are consistent with Grade I diastolic dysfunction (impaired relaxation). Normal left ventricular filling pressure. Right Ventricle: The right ventricular size is normal. No increase in right ventricular wall thickness. Right ventricular systolic function is normal. There is normal pulmonary artery systolic pressure. The tricuspid regurgitant velocity is 2.50 m/s, and  with an assumed right atrial pressure of 3 mmHg, the estimated right ventricular systolic pressure is 76.7 mmHg. Left Atrium: Left atrial size was normal in size. Right Atrium: Right atrial size was normal in size. Pericardium: There is no evidence of pericardial effusion. Mitral Valve: The mitral valve is normal in structure. No evidence of mitral valve regurgitation. No evidence of mitral valve stenosis. MV peak gradient, 4.8 mmHg. The mean mitral valve gradient is 1.0 mmHg. Tricuspid Valve: The tricuspid valve is not well visualized. Tricuspid valve regurgitation is trivial. No evidence of tricuspid stenosis. Aortic Valve: The aortic valve is tricuspid. There is mild calcification of the aortic valve. There is mild thickening of the aortic  valve. There is mild aortic valve annular calcification. Aortic valve regurgitation is not visualized. No aortic stenosis  is present. Aortic valve mean gradient measures 5.0 mmHg. Aortic valve peak gradient measures 11.7 mmHg. Aortic valve area, by VTI measures 2.33 cm. Pulmonic Valve: The pulmonic valve was not well visualized. Pulmonic valve regurgitation is not visualized. No evidence of pulmonic stenosis. Aorta: The aortic root is normal in  size and structure. Venous: The inferior vena cava is normal in size with greater than 50% respiratory variability, suggesting right atrial pressure of 3 mmHg. IAS/Shunts: The interatrial septum was not well visualized.  LEFT VENTRICLE PLAX 2D LVIDd:         4.10 cm     Diastology LVIDs:         2.30 cm     LV e' medial:    6.74 cm/s LV PW:         1.30 cm     LV E/e' medial:  10.2 LV IVS:        1.20 cm     LV e' lateral:   6.64 cm/s LVOT diam:     2.00 cm     LV E/e' lateral: 10.3 LV SV:         76 LV SV Index:   38 LVOT Area:     3.14 cm  LV Volumes (MOD) LV vol d, MOD A2C: 49.4 ml LV vol d, MOD A4C: 34.7 ml LV vol s, MOD A2C: 22.4 ml LV vol s, MOD A4C: 16.8 ml LV SV MOD A2C:     27.0 ml LV SV MOD A4C:     34.7 ml LV SV MOD BP:      24.3 ml RIGHT VENTRICLE RV Basal diam:  3.30 cm RV Mid diam:    2.50 cm RV S prime:     14.60 cm/s LEFT ATRIUM             Index        RIGHT ATRIUM           Index LA diam:        4.00 cm 2.02 cm/m   RA Area:     14.60 cm LA Vol (A2C):   48.1 ml 24.25 ml/m  RA Volume:   38.10 ml  19.21 ml/m LA Vol (A4C):   48.2 ml 24.30 ml/m LA Biplane Vol: 51.5 ml 25.97 ml/m  AORTIC VALVE                     PULMONIC VALVE AV Area (Vmax):    2.06 cm      PV Vmax:       0.97 m/s AV Area (Vmean):   1.76 cm      PV Peak grad:  3.7 mmHg AV Area (VTI):     2.33 cm AV Vmax:           171.00 cm/s AV Vmean:          103.000 cm/s AV VTI:            0.327 m AV Peak Grad:      11.7 mmHg AV Mean Grad:      5.0 mmHg LVOT Vmax:         112.00 cm/s LVOT Vmean:         57.700 cm/s LVOT VTI:          0.243 m LVOT/AV VTI ratio: 0.74  AORTA Ao Root diam: 2.60 cm Ao Asc diam:  3.00 cm MITRAL VALVE                TRICUSPID VALVE MV Area (PHT): 2.76 cm     TR Peak grad:   25.0 mmHg MV Area VTI:   2.89 cm     TR Vmax:        250.00 cm/s MV Peak grad:  4.8 mmHg MV Mean grad:  1.0 mmHg     SHUNTS MV Vmax:       1.09 m/s     Systemic VTI:  0.24 m MV Vmean:      54.0 cm/s    Systemic Diam: 2.00 cm MV Decel Time: 275 msec MV E velocity: 68.60 cm/s MV A velocity: 107.00 cm/s MV E/A ratio:  0.64 Carlyle Dolly MD Electronically signed by Carlyle Dolly MD Signature Date/Time: 11/14/2021/12:40:19 PM    Final      Subjective: Pt without any specific complaints. No SOB, no cough, no fever, no chest pain.  Right foot is feeling much better now.    Discharge Exam: Vitals:   11/15/21 0429 11/15/21 1616  BP: (!) 177/71 (!) 148/67  Pulse: 75 (!) 57  Resp: 19 20  Temp: 97.9 F (36.6 C) 97.8 F (36.6 C)  SpO2: 96% 97%   Vitals:   11/14/21 1250 11/14/21 2006 11/15/21 0429 11/15/21 1616  BP: (!) 152/62 (!) 134/53 (!) 177/71 (!) 148/67  Pulse: 75 77 75 (!) 57  Resp: _0 Temp: 98.8 F (37.1 C) 97.6 F (36.4 C) 97.9 F (36.6 C) 97.8 F (36.6 C)  TempSrc:  Oral Oral   SpO2: 97% 96% 96% 97%  Weight:      Height:       General: Pt is alert, awake, not in acute distress Cardiovascular: normal S1/S2 +, no rubs, no gallops Respiratory: CTA bilaterally, no wheezing, no rhonchi Abdominal: Soft, NT, ND, bowel sounds + Extremities: no edema, no cyanosis   The results of significant diagnostics from this hospitalization (including imaging, microbiology, ancillary and laboratory) are listed below for reference.     Microbiology: Recent Results (from the past 240 hour(s))  Resp Panel by RT-PCR (Flu A&B, Covid) Nasopharyngeal Swab     Status: Abnormal   Collection Time: 11/13/21  4:48 PM   Specimen: Nasopharyngeal Swab; Nasopharyngeal(NP) swabs in vial transport  medium  Result Value Ref Range Status   SARS Coronavirus 2 by RT PCR POSITIVE (A) NEGATIVE Final    Comment: (NOTE) SARS-CoV-2 target nucleic acids are DETECTED.  The SARS-CoV-2 RNA is generally detectable in upper respiratory specimens during the acute phase of infection. Positive results are indicative of the presence of the identified virus, but do not rule out bacterial infection or co-infection with other pathogens not detected by the test. Clinical correlation with patient history and other diagnostic information is necessary to determine patient infection status. The expected result is Negative.  Fact Sheet for Patients: EntrepreneurPulse.com.au  Fact Sheet for Healthcare Providers: IncredibleEmployment.be  This test is not yet approved or cleared by the [place] FDA and  has been authorized for detection and/or diagnosis of SARS-CoV-2 by FDA under an Emergency Use Authorization (EUA).  This EUA will remain in effect (meaning this test can be used) for the duration of  the COVID-19 declaration under Section 564(b)(1) of the A ct, 21 U.S.C. section 360bbb-3(b)(1), unless the authorization is terminated or revoked sooner.     Influenza A by PCR NEGATIVE NEGATIVE Final   Influenza B by PCR NEGATIVE NEGATIVE Final    Comment: (NOTE) The Xpert Xpress SARS-CoV-2/FLU/RSV plus assay is intended as an aid in the diagnosis of influenza from Nasopharyngeal swab specimens and should not be used as a sole basis for treatment. Nasal washings and aspirates are unacceptable for Xpert Xpress SARS-CoV-2/FLU/RSV testing.  Fact Sheet for Patients: EntrepreneurPulse.com.au  Fact Sheet for Healthcare Providers: IncredibleEmployment.be  This test is not yet  approved or cleared by the [place] and has been authorized for detection and/or diagnosis of SARS-CoV-2 by FDA under an Emergency Use Authorization  (EUA). This EUA will remain in effect (meaning this test can be used) for the duration of the COVID-19 declaration under Section 564(b)(1) of the Act, 21 U.S.C. section 360bbb-3(b)(1), unless the authorization is terminated or revoked.  Performed at [facility], [address] [place], [place] [address]      Labs: BNP (last 3 results) No results for input(s): BNP in the last 8760 hours. Basic Metabolic Panel: Recent Labs  Lab 11/13/21 1639 11/14/21 0046 11/15/21 0641  NA 136 136 138  K 3.7 3.9 3.4*  CL 101 106 107  CO2 21* 20* 20*  GLUCOSE 331* 309* 159*  BUN 49* 55* 68*  CREATININE 1.72* 1.66* 1.64*  CALCIUM 9.3 8.3* 8.7*  MG 1.9 1.8 2.1  PHOS  --  3.8  --    Liver Function Tests: Recent Labs  Lab 11/13/21 1639 11/14/21 0046 11/15/21 0641  AST 69* 51* 46*  ALT 38 32 30  ALKPHOS 74 59 51  BILITOT 0.7 0.5 0.6  PROT 7.4 6.2* 6.0*  ALBUMIN 3.5 3.0* 2.7*   No results for input(s): LIPASE, AMYLASE in the last 168 hours. No results for input(s): AMMONIA in the last 168 hours. CBC: Recent Labs  Lab 11/13/21 1639 11/14/21 0046 11/15/21 0641  WBC 10.9* 11.1* 6.8  NEUTROABS 9.6*  --   --   HGB 14.0 11.9* 11.2*  HCT 44.4 38.1 35.1*  MCV 89.3 92.3 91.6  PLT 362 318 301   Cardiac Enzymes: Recent Labs  Lab 11/13/21 1639 11/14/21 0046 11/15/21 0641  CKTOTAL 2,534* 1,809* 1,530*   BNP: Invalid input(s): POCBNP CBG: Recent Labs  Lab 11/14/21 1609 11/14/21 2007 11/15/21 0801 11/15/21 1252 11/15/21 1613  GLUCAP 249* 220* 183* 201* 198*   D-Dimer Recent Labs    11/14/21 0905 11/15/21 0641  DDIMER 2.01* 1.77*   Hgb A1c Recent Labs    11/14/21 0046  HGBA1C 9.8*   Lipid Profile No results for input(s): CHOL, HDL, LDLCALC, TRIG, CHOLHDL, LDLDIRECT in the last 72 hours. Thyroid function studies No results for input(s): TSH, T4TOTAL, T3FREE, THYROIDAB in the last 72 hours.  Invalid input(s): FREET3 Anemia work up Recent Labs    11/14/21 0905  11/15/21 0641  VITAMINB12 2,464*  --   FERRITIN 255 224   Urinalysis    Component Value Date/Time   COLORURINE YELLOW 11/13/2021 2059   APPEARANCEUR CLEAR 11/13/2021 2059   LABSPEC 1.025 11/13/2021 2059   PHURINE 6.5 11/13/2021 2059   GLUCOSEU 250 (A) 11/13/2021 2059   HGBUR MODERATE (A) 11/13/2021 2059   HGBUR negative 11/23/2010 1459   BILIRUBINUR NEGATIVE 11/13/2021 2059   BILIRUBINUR n 11/13/2011 1643   KETONESUR NEGATIVE 11/13/2021 2059   PROTEINUR 100 (A) 11/13/2021 2059   UROBILINOGEN 0.2 11/13/2011 1643   UROBILINOGEN 0.2 11/23/2010 1459   NITRITE NEGATIVE 11/13/2021 2059   LEUKOCYTESUR MODERATE (A) 11/13/2021 2059   Sepsis Labs Invalid input(s): PROCALCITONIN,  WBC,  LACTICIDVEN Microbiology Recent Results (from the past 240 hour(s))  Resp Panel by RT-PCR (Flu A&B, Covid) Nasopharyngeal Swab     Status: Abnormal   Collection Time: 11/13/21  4:48 PM   Specimen: Nasopharyngeal Swab; Nasopharyngeal(NP) swabs in vial transport medium  Result Value Ref Range Status   SARS Coronavirus 2 by RT PCR POSITIVE (A) NEGATIVE Final    Comment: (NOTE) SARS-CoV-2 target nucleic acids are DETECTED.  The SARS-CoV-2 RNA is generally detectable in upper respiratory specimens during the acute phase of infection. Positive results are indicative of the presence of the identified virus, but do not rule out bacterial infection or co-infection with other pathogens not detected by the test. Clinical correlation with patient history and other diagnostic information is necessary to determine patient infection status. The expected result is Negative.  Fact Sheet for Patients: EntrepreneurPulse.com.au  Fact Sheet for Healthcare Providers: IncredibleEmployment.be  This test is not yet approved or cleared by the [place] FDA and  has been authorized for detection and/or diagnosis of SARS-CoV-2 by FDA under an Emergency Use Authorization (EUA).  This  EUA will remain in effect (meaning this test can be used) for the duration of  the COVID-19 declaration under Section 564(b)(1) of the A ct, 21 U.S.C. section 360bbb-3(b)(1), unless the authorization is terminated or revoked sooner.     Influenza A by PCR NEGATIVE NEGATIVE Final   Influenza B by PCR NEGATIVE NEGATIVE Final    Comment: (NOTE) The Xpert Xpress SARS-CoV-2/FLU/RSV plus assay is intended as an aid in the diagnosis of influenza from Nasopharyngeal swab specimens and should not be used as a sole basis for treatment. Nasal washings and aspirates are unacceptable for Xpert Xpress SARS-CoV-2/FLU/RSV testing.  Fact Sheet for Patients: EntrepreneurPulse.com.au  Fact Sheet for Healthcare Providers: IncredibleEmployment.be  This test is not yet approved or cleared by the [place] FDA and has been authorized for detection and/or diagnosis of SARS-CoV-2 by FDA under an Emergency Use Authorization (EUA). This EUA will remain in effect (meaning this test can be used) for the duration of the COVID-19 declaration under Section 564(b)(1) of the Act, 21 U.S.C. section 360bbb-3(b)(1), unless the authorization is terminated or revoked.  Performed at [facility], [address] [place], [place] [address]    Time coordinating discharge: 35 mins  SIGNED:  Irwin Brakeman, MD  [facility] 11/15/2021, 4:28 PM How to contact the [facility] Attending or Consulting provider [place] or covering provider during after hours [place], for this patient?  Check the care team in [facility] and look for a) attending/consulting [facility] provider listed and b) the [facility] team listed Log into [email or web address] and use [facility] universal password to access. If you do not have the password, please contact the hospital operator. Locate the [facility] provider you are looking for under [facility] and page to a number that you can be directly reached. If you still have  difficulty reaching the provider, please page the [facility] (Director on Call) for the Hospitalists listed on amion for assistance.

## 2021-11-15 NOTE — Progress Notes (Signed)
Called receiving facility [facility] to give report of pt. Spoke with [employer]. Report received and accepted.

## 2021-11-15 NOTE — Progress Notes (Signed)
Physical Therapy Treatment Patient Details Name: Krista Payne MRN: [id number] DOB: [birth year] Today's Date: 11/15/2021   History of Present Illness Krista Payne is a 81 y.o. female with medical history significant for T2DM, CKD stage 3B, anemia of chronic disease, essential hypertension and hyperlipidemia who presents to the emergency department via EMS due to altered mental status.  Patient was unable to provide history, history was obtained from ED physician, ED medical record and daughter at bedside.  Per daughter, patient lives alone and was able to take care of her ADLs at baseline.  Patient was suspected to have sustained a fall while in the bathroom on Saturday night/early Sunday morning.  Daughter states that she was unable to reach the patient on the telephone since Friday, so a welfare check was called and patient was found on bathroom floor covered in urine and with blood on her hands.  Patient was then taken to the ED for further evaluation and management.    PT Comments    Patient presents sitting on [facility] with NT assisting and agreeable for therapy.  Patient very unsteady on feet and limited to a few slow labored side steps during transfer from [facility] to bedside and chair.  Patient demonstrates fair/good return for completing BLE ROM/strengthening exercises while seated in chair and tolerated staying up in chair after therapy.  Patient will benefit from continued skilled physical therapy in hospital and recommended venue below to increase strength, balance, endurance for safe ADLs and gait.     Recommendations for follow up therapy are one component of a multi-disciplinary discharge planning process, led by the attending physician.  Recommendations may be updated based on patient status, additional functional criteria and insurance authorization.  Follow Up Recommendations  Skilled nursing-short term rehab (<3 hours/day)     Assistance Recommended at Discharge Intermittent  Supervision/Assistance  Patient can return home with the following A lot of help with walking and/or transfers;A little help with bathing/dressing/bathroom;Help with stairs or ramp for entrance   Equipment Recommendations  None recommended by PT    Recommendations for Other Services       Precautions / Restrictions Precautions Precautions: Fall Restrictions Weight Bearing Restrictions: No     Mobility  Bed Mobility               General bed mobility comments: Patient presents seated on BSC (assisted by nursing staff)    Transfers Overall transfer level: Needs assistance Equipment used: Rolling walker (2 wheels) Transfers: Sit to/from Stand, Bed to chair/wheelchair/BSC Sit to Stand: Min assist   Step pivot transfers: Mod assist       General transfer comment: slow labored movement for tranferring from [facility] to bedside, then chair    Ambulation/Gait Ambulation/Gait assistance: Mod assist Gait Distance (Feet): 6 Feet Assistive device: Rolling walker (2 wheels) Gait Pattern/deviations: Decreased step length - right, Decreased step length - left, Decreased stride length, Trunk flexed Gait velocity: decreased     General Gait Details: slow labored unsteady side steps limited mostly due to fatigue and BLE weakness   Stairs             Wheelchair Mobility    Modified Rankin (Stroke Patients Only)       Balance Overall balance assessment: Needs assistance Sitting-balance support: Feet supported, No upper extremity supported Sitting balance-Leahy Scale: Good Sitting balance - Comments: sitting EOB   Standing balance support: Bilateral upper extremity supported, During functional activity, Reliant on assistive device for balance Standing balance-Leahy Scale: Fair  Standing balance comment: fair/poor using RW                            Cognition Arousal/Alertness: Awake/alert Behavior During Therapy: WFL for tasks assessed/performed Overall  Cognitive Status: Within Functional Limits for tasks assessed                                          Exercises General Exercises - Lower Extremity Long Arc Quad: Seated, AROM, Strengthening, Both, 10 reps Hip Flexion/Marching: Seated, AROM, Strengthening, Both, 10 reps Toe Raises: Seated, AROM, Strengthening, Both, 10 reps Heel Raises: Seated, AROM, Strengthening, Both, 10 reps    General Comments        Pertinent Vitals/Pain Pain Assessment Pain Assessment: No/denies pain    Home Living                          Prior Function            PT Goals (current goals can now be found in the care plan section) Acute Rehab PT Goals Patient Stated Goal: return home with family to assist PT Goal Formulation: With patient Time For Goal Achievement: 11/28/21 Potential to Achieve Goals: Good Progress towards PT goals: Progressing toward goals    Frequency    Min 3X/week      PT Plan Current plan remains appropriate    Co-evaluation              AM-PAC PT "6 Clicks" Mobility   Outcome Measure  Help needed turning from your back to your side while in a flat bed without using bedrails?: A Little Help needed moving from lying on your back to sitting on the side of a flat bed without using bedrails?: A Lot Help needed moving to and from a bed to a chair (including a wheelchair)?: A Lot Help needed standing up from a chair using your arms (e.g., wheelchair or bedside chair)?: A Lot Help needed to walk in hospital room?: A Lot Help needed climbing 3-5 steps with a railing? : A Lot 6 Click Score: 13    End of Session   Activity Tolerance: Patient tolerated treatment well;Patient limited by fatigue Patient left: in chair;with call bell/phone within reach Nurse Communication: Mobility status PT Visit Diagnosis: Other abnormalities of gait and mobility (R26.89);Unsteadiness on feet (R26.81);Muscle weakness (generalized) (M62.81)     Time:  1308-6578 PT Time Calculation (min) (ACUTE ONLY): 25 min  Charges:  $Therapeutic Exercise: 8-22 mins $Therapeutic Activity: 8-22 mins                     1:58 PM, 11/15/21 Lonell Grandchild, MPT Physical Therapist with [facility] [phone] office [phone] mobile phone

## 2021-11-15 NOTE — Progress Notes (Signed)
Pt was discharged via stretcher with EMS.

## 2021-11-15 NOTE — Discharge Instructions (Signed)

## 2021-11-20 DIAGNOSIS — E785 Hyperlipidemia, unspecified: Secondary | ICD-10-CM | POA: Diagnosis not present

## 2021-11-20 DIAGNOSIS — E1165 Type 2 diabetes mellitus with hyperglycemia: Secondary | ICD-10-CM | POA: Diagnosis not present

## 2021-11-20 DIAGNOSIS — I10 Essential (primary) hypertension: Secondary | ICD-10-CM | POA: Diagnosis not present

## 2021-11-20 DIAGNOSIS — U071 COVID-19: Secondary | ICD-10-CM | POA: Diagnosis not present

## 2021-11-20 DIAGNOSIS — J302 Other seasonal allergic rhinitis: Secondary | ICD-10-CM | POA: Diagnosis not present

## 2021-11-20 DIAGNOSIS — Z794 Long term (current) use of insulin: Secondary | ICD-10-CM | POA: Diagnosis not present

## 2021-11-20 DIAGNOSIS — L03115 Cellulitis of right lower limb: Secondary | ICD-10-CM | POA: Diagnosis not present

## 2021-11-20 DIAGNOSIS — N1832 Chronic kidney disease, stage 3b: Secondary | ICD-10-CM | POA: Diagnosis not present

## 2021-11-21 DIAGNOSIS — N1832 Chronic kidney disease, stage 3b: Secondary | ICD-10-CM | POA: Diagnosis not present

## 2021-11-21 DIAGNOSIS — U071 COVID-19: Secondary | ICD-10-CM | POA: Diagnosis not present

## 2021-11-21 DIAGNOSIS — I248 Other forms of acute ischemic heart disease: Secondary | ICD-10-CM | POA: Diagnosis not present

## 2021-11-21 DIAGNOSIS — L089 Local infection of the skin and subcutaneous tissue, unspecified: Secondary | ICD-10-CM | POA: Diagnosis not present

## 2021-11-21 DIAGNOSIS — N390 Urinary tract infection, site not specified: Secondary | ICD-10-CM | POA: Diagnosis not present

## 2021-11-21 DIAGNOSIS — G9341 Metabolic encephalopathy: Secondary | ICD-10-CM | POA: Diagnosis not present

## 2021-11-21 DIAGNOSIS — E860 Dehydration: Secondary | ICD-10-CM | POA: Diagnosis not present

## 2021-11-21 DIAGNOSIS — T796XXD Traumatic ischemia of muscle, subsequent encounter: Secondary | ICD-10-CM | POA: Diagnosis not present

## 2021-11-22 ENCOUNTER — Inpatient Hospital Stay (HOSPITAL_COMMUNITY): Payer: Medicare HMO

## 2021-11-22 DIAGNOSIS — B351 Tinea unguium: Secondary | ICD-10-CM | POA: Diagnosis not present

## 2021-11-22 DIAGNOSIS — M79675 Pain in left toe(s): Secondary | ICD-10-CM | POA: Diagnosis not present

## 2021-11-22 DIAGNOSIS — M79674 Pain in right toe(s): Secondary | ICD-10-CM | POA: Diagnosis not present

## 2021-11-27 DIAGNOSIS — C50919 Malignant neoplasm of unspecified site of unspecified female breast: Secondary | ICD-10-CM | POA: Diagnosis not present

## 2021-11-27 DIAGNOSIS — N1832 Chronic kidney disease, stage 3b: Secondary | ICD-10-CM | POA: Diagnosis not present

## 2021-11-27 DIAGNOSIS — K649 Unspecified hemorrhoids: Secondary | ICD-10-CM | POA: Diagnosis not present

## 2021-11-27 DIAGNOSIS — Z794 Long term (current) use of insulin: Secondary | ICD-10-CM | POA: Diagnosis not present

## 2021-11-27 DIAGNOSIS — E1165 Type 2 diabetes mellitus with hyperglycemia: Secondary | ICD-10-CM | POA: Diagnosis not present

## 2021-11-27 DIAGNOSIS — E785 Hyperlipidemia, unspecified: Secondary | ICD-10-CM | POA: Diagnosis not present

## 2021-11-27 DIAGNOSIS — K5900 Constipation, unspecified: Secondary | ICD-10-CM | POA: Diagnosis not present

## 2021-11-27 DIAGNOSIS — I10 Essential (primary) hypertension: Secondary | ICD-10-CM | POA: Diagnosis not present

## 2021-11-29 ENCOUNTER — Ambulatory Visit (HOSPITAL_COMMUNITY): Payer: Medicare HMO | Admitting: Hematology

## 2021-11-29 DIAGNOSIS — L89323 Pressure ulcer of left buttock, stage 3: Secondary | ICD-10-CM | POA: Diagnosis not present

## 2021-11-29 DIAGNOSIS — L89313 Pressure ulcer of right buttock, stage 3: Secondary | ICD-10-CM | POA: Diagnosis not present

## 2021-11-29 DIAGNOSIS — E11621 Type 2 diabetes mellitus with foot ulcer: Secondary | ICD-10-CM | POA: Diagnosis not present

## 2021-11-29 DIAGNOSIS — L97519 Non-pressure chronic ulcer of other part of right foot with unspecified severity: Secondary | ICD-10-CM | POA: Diagnosis not present

## 2021-11-30 DIAGNOSIS — E1165 Type 2 diabetes mellitus with hyperglycemia: Secondary | ICD-10-CM | POA: Diagnosis not present

## 2021-11-30 DIAGNOSIS — N1832 Chronic kidney disease, stage 3b: Secondary | ICD-10-CM | POA: Diagnosis not present

## 2021-11-30 DIAGNOSIS — R69 Illness, unspecified: Secondary | ICD-10-CM | POA: Diagnosis not present

## 2021-11-30 DIAGNOSIS — L97519 Non-pressure chronic ulcer of other part of right foot with unspecified severity: Secondary | ICD-10-CM | POA: Diagnosis not present

## 2021-11-30 DIAGNOSIS — E785 Hyperlipidemia, unspecified: Secondary | ICD-10-CM | POA: Diagnosis not present

## 2021-11-30 DIAGNOSIS — C50919 Malignant neoplasm of unspecified site of unspecified female breast: Secondary | ICD-10-CM | POA: Diagnosis not present

## 2021-11-30 DIAGNOSIS — Z794 Long term (current) use of insulin: Secondary | ICD-10-CM | POA: Diagnosis not present

## 2021-11-30 DIAGNOSIS — G4700 Insomnia, unspecified: Secondary | ICD-10-CM | POA: Diagnosis not present

## 2021-12-01 DIAGNOSIS — M6281 Muscle weakness (generalized): Secondary | ICD-10-CM | POA: Diagnosis not present

## 2021-12-02 DIAGNOSIS — N1832 Chronic kidney disease, stage 3b: Secondary | ICD-10-CM | POA: Diagnosis not present

## 2021-12-02 DIAGNOSIS — J302 Other seasonal allergic rhinitis: Secondary | ICD-10-CM | POA: Diagnosis not present

## 2021-12-02 DIAGNOSIS — E1165 Type 2 diabetes mellitus with hyperglycemia: Secondary | ICD-10-CM | POA: Diagnosis not present

## 2021-12-02 DIAGNOSIS — F32A Depression, unspecified: Secondary | ICD-10-CM | POA: Diagnosis not present

## 2021-12-02 DIAGNOSIS — L89320 Pressure ulcer of left buttock, unstageable: Secondary | ICD-10-CM | POA: Diagnosis not present

## 2021-12-02 DIAGNOSIS — M6282 Rhabdomyolysis: Secondary | ICD-10-CM | POA: Diagnosis not present

## 2021-12-02 DIAGNOSIS — I248 Other forms of acute ischemic heart disease: Secondary | ICD-10-CM | POA: Diagnosis not present

## 2021-12-02 DIAGNOSIS — R69 Illness, unspecified: Secondary | ICD-10-CM | POA: Diagnosis not present

## 2021-12-02 DIAGNOSIS — Z853 Personal history of malignant neoplasm of breast: Secondary | ICD-10-CM | POA: Diagnosis not present

## 2021-12-02 DIAGNOSIS — L97519 Non-pressure chronic ulcer of other part of right foot with unspecified severity: Secondary | ICD-10-CM | POA: Diagnosis not present

## 2021-12-02 DIAGNOSIS — R2689 Other abnormalities of gait and mobility: Secondary | ICD-10-CM | POA: Diagnosis not present

## 2021-12-02 DIAGNOSIS — L89310 Pressure ulcer of right buttock, unstageable: Secondary | ICD-10-CM | POA: Diagnosis not present

## 2021-12-02 DIAGNOSIS — Z8616 Personal history of COVID-19: Secondary | ICD-10-CM | POA: Diagnosis not present

## 2021-12-02 DIAGNOSIS — L03115 Cellulitis of right lower limb: Secondary | ICD-10-CM | POA: Diagnosis not present

## 2021-12-02 DIAGNOSIS — M6281 Muscle weakness (generalized): Secondary | ICD-10-CM | POA: Diagnosis not present

## 2021-12-02 DIAGNOSIS — Z7984 Long term (current) use of oral hypoglycemic drugs: Secondary | ICD-10-CM | POA: Diagnosis not present

## 2021-12-02 DIAGNOSIS — G4700 Insomnia, unspecified: Secondary | ICD-10-CM | POA: Diagnosis not present

## 2021-12-02 DIAGNOSIS — D631 Anemia in chronic kidney disease: Secondary | ICD-10-CM | POA: Diagnosis not present

## 2021-12-02 DIAGNOSIS — Z6839 Body mass index (BMI) 39.0-39.9, adult: Secondary | ICD-10-CM | POA: Diagnosis not present

## 2021-12-02 DIAGNOSIS — U071 COVID-19: Secondary | ICD-10-CM | POA: Diagnosis not present

## 2021-12-02 DIAGNOSIS — B9620 Unspecified Escherichia coli [E. coli] as the cause of diseases classified elsewhere: Secondary | ICD-10-CM | POA: Diagnosis not present

## 2021-12-02 DIAGNOSIS — E11621 Type 2 diabetes mellitus with foot ulcer: Secondary | ICD-10-CM | POA: Diagnosis not present

## 2021-12-02 DIAGNOSIS — I130 Hypertensive heart and chronic kidney disease with heart failure and stage 1 through stage 4 chronic kidney disease, or unspecified chronic kidney disease: Secondary | ICD-10-CM | POA: Diagnosis not present

## 2021-12-02 DIAGNOSIS — N179 Acute kidney failure, unspecified: Secondary | ICD-10-CM | POA: Diagnosis not present

## 2021-12-02 DIAGNOSIS — H3530 Unspecified macular degeneration: Secondary | ICD-10-CM | POA: Diagnosis not present

## 2021-12-02 DIAGNOSIS — N390 Urinary tract infection, site not specified: Secondary | ICD-10-CM | POA: Diagnosis not present

## 2021-12-02 DIAGNOSIS — I5030 Unspecified diastolic (congestive) heart failure: Secondary | ICD-10-CM | POA: Diagnosis not present

## 2021-12-02 DIAGNOSIS — T796XXD Traumatic ischemia of muscle, subsequent encounter: Secondary | ICD-10-CM | POA: Diagnosis not present

## 2021-12-02 DIAGNOSIS — E782 Mixed hyperlipidemia: Secondary | ICD-10-CM | POA: Diagnosis not present

## 2021-12-02 DIAGNOSIS — E1122 Type 2 diabetes mellitus with diabetic chronic kidney disease: Secondary | ICD-10-CM | POA: Diagnosis not present

## 2021-12-05 ENCOUNTER — Inpatient Hospital Stay (HOSPITAL_COMMUNITY): Payer: Medicare HMO

## 2021-12-12 ENCOUNTER — Ambulatory Visit (HOSPITAL_COMMUNITY): Payer: Medicare HMO | Admitting: Hematology

## 2022-01-04 DIAGNOSIS — L97509 Non-pressure chronic ulcer of other part of unspecified foot with unspecified severity: Secondary | ICD-10-CM | POA: Diagnosis not present

## 2022-01-04 DIAGNOSIS — E11621 Type 2 diabetes mellitus with foot ulcer: Secondary | ICD-10-CM | POA: Diagnosis not present

## 2022-01-04 DIAGNOSIS — U071 COVID-19: Secondary | ICD-10-CM | POA: Diagnosis not present

## 2022-01-04 DIAGNOSIS — E785 Hyperlipidemia, unspecified: Secondary | ICD-10-CM | POA: Diagnosis not present

## 2022-01-04 DIAGNOSIS — I10 Essential (primary) hypertension: Secondary | ICD-10-CM | POA: Diagnosis not present

## 2022-01-04 DIAGNOSIS — Z87440 Personal history of urinary (tract) infections: Secondary | ICD-10-CM | POA: Diagnosis not present

## 2022-01-04 DIAGNOSIS — N184 Chronic kidney disease, stage 4 (severe): Secondary | ICD-10-CM | POA: Diagnosis not present

## 2022-01-05 LAB — BASIC METABOLIC PANEL
BUN: 41 — AB (ref 4–21)
Creatinine: 2.3 — AB (ref 0.5–1.1)
Glucose: 392
Potassium: 5.6 mEq/L — AB (ref 3.5–5.1)

## 2022-01-05 LAB — COMPREHENSIVE METABOLIC PANEL: eGFR: 20

## 2022-01-08 ENCOUNTER — Encounter (HOSPITAL_COMMUNITY): Payer: Self-pay | Admitting: Emergency Medicine

## 2022-01-08 NOTE — Progress Notes (Signed)
Completed cancer claim form from [employer].  Daughter will come pick up. Daughter notified and verbalized understanding.  ?

## 2022-01-08 NOTE — Progress Notes (Deleted)
[employer] STD completed and faxed to [phone] (conformation number [id number]).  Sent to be scanned.  ?

## 2022-01-14 DIAGNOSIS — R69 Illness, unspecified: Secondary | ICD-10-CM | POA: Diagnosis not present

## 2022-01-17 DIAGNOSIS — R69 Illness, unspecified: Secondary | ICD-10-CM | POA: Diagnosis not present

## 2022-01-30 DIAGNOSIS — L03115 Cellulitis of right lower limb: Secondary | ICD-10-CM | POA: Diagnosis not present

## 2022-01-30 DIAGNOSIS — T796XXD Traumatic ischemia of muscle, subsequent encounter: Secondary | ICD-10-CM | POA: Diagnosis not present

## 2022-01-30 DIAGNOSIS — E1122 Type 2 diabetes mellitus with diabetic chronic kidney disease: Secondary | ICD-10-CM | POA: Diagnosis not present

## 2022-01-30 DIAGNOSIS — L97519 Non-pressure chronic ulcer of other part of right foot with unspecified severity: Secondary | ICD-10-CM | POA: Diagnosis not present

## 2022-01-30 DIAGNOSIS — M6282 Rhabdomyolysis: Secondary | ICD-10-CM | POA: Diagnosis not present

## 2022-01-30 DIAGNOSIS — U071 COVID-19: Secondary | ICD-10-CM | POA: Diagnosis not present

## 2022-01-30 DIAGNOSIS — E1165 Type 2 diabetes mellitus with hyperglycemia: Secondary | ICD-10-CM | POA: Diagnosis not present

## 2022-01-30 DIAGNOSIS — N390 Urinary tract infection, site not specified: Secondary | ICD-10-CM | POA: Diagnosis not present

## 2022-01-30 DIAGNOSIS — L89310 Pressure ulcer of right buttock, unstageable: Secondary | ICD-10-CM | POA: Diagnosis not present

## 2022-01-30 DIAGNOSIS — E11621 Type 2 diabetes mellitus with foot ulcer: Secondary | ICD-10-CM | POA: Diagnosis not present

## 2022-02-01 ENCOUNTER — Ambulatory Visit (INDEPENDENT_AMBULATORY_CARE_PROVIDER_SITE_OTHER): Payer: Medicare HMO | Admitting: Nurse Practitioner

## 2022-02-01 ENCOUNTER — Encounter: Payer: Self-pay | Admitting: Nurse Practitioner

## 2022-02-01 ENCOUNTER — Other Ambulatory Visit: Payer: Self-pay

## 2022-02-01 VITALS — BP 98/63 | HR 56 | Ht 60.0 in | Wt 193.0 lb

## 2022-02-01 DIAGNOSIS — N184 Chronic kidney disease, stage 4 (severe): Secondary | ICD-10-CM

## 2022-02-01 DIAGNOSIS — E1122 Type 2 diabetes mellitus with diabetic chronic kidney disease: Secondary | ICD-10-CM | POA: Diagnosis not present

## 2022-02-01 DIAGNOSIS — R69 Illness, unspecified: Secondary | ICD-10-CM | POA: Diagnosis not present

## 2022-02-01 MED ORDER — TRESIBA FLEXTOUCH 100 UNIT/ML ~~LOC~~ SOPN: 15.0000 [IU] | PEN_INJECTOR | Freq: Every day | SUBCUTANEOUS | 3 refills | Status: DC

## 2022-02-01 MED ORDER — PEN NEEDLES 31G X 6 MM MISC: 3 refills | Status: AC

## 2022-02-01 NOTE — Progress Notes (Signed)
? ?                                                    Endocrinology Consult Note  ?     02/01/2022, 4:55 PM ? ? ?Subjective:  ? ? Patient ID: Krista Payne, female    DOB: 1941-07-02.  ?Krista Payne is being seen in consultation for management of currently uncontrolled symptomatic diabetes requested by  Celene Squibb, MD. ? ? ?Past Medical History:  ?Diagnosis Date  ? Breast cancer ([place])   ? R  ? Chronic kidney disease   ? Diabetes mellitus without complication ([place])   ? Hyperlipidemia   ? Hypertension   ? Macular degeneration   ? Osteoarthritis   ? ? ?Past Surgical History:  ?Procedure Laterality Date  ? CATARACT EXTRACTION W/PHACO Right 02/07/2015  ? Procedure: CATARACT EXTRACTION PHACO AND INTRAOCULAR LENS PLACEMENT (IOC);  Surgeon: Tonny Branch, MD;  Location: [facility];  Service: Ophthalmology;  Laterality: Right;  CDE:12.72  ? CATARACT EXTRACTION W/PHACO Left 02/24/2015  ? Procedure: CATARACT EXTRACTION PHACO AND INTRAOCULAR LENS PLACEMENT LEFT EYE;  Surgeon: Tonny Branch, MD;  Location: [facility];  Service: Ophthalmology;  Laterality: Left;  CDE 11.69  ? COLONOSCOPY    ? ? ?Social History  ? ?Socioeconomic History  ? Marital status: Widowed  ?  Spouse name: Not on file  ? Number of children: 3  ? Years of education: Not on file  ? Highest education level: Not on file  ?Occupational History  ? Not on file  ?Tobacco Use  ? Smoking status: Never  ? Smokeless tobacco: Never  ?Vaping Use  ? Vaping Use: Never used  ?Substance and Sexual Activity  ? Alcohol use: No  ? Drug use: No  ? Sexual activity: Never  ?Other Topics Concern  ? Not on file  ?Social History Narrative  ? Not on file  ? ?Social Determinants of Health  ? ?Financial Resource Strain: Not on file  ?Food Insecurity: Not on file  ?Transportation Needs: Not on file  ?Physical Activity: Not on file  ?Stress: Not on file  ?Social Connections: Not on file  ? ? ?Family History  ?Problem Relation Age of Onset  ? Macular degeneration Mother   ? Diabetes  Father   ? Heart disease Daughter   ? ? ?Outpatient Encounter Medications as of 02/01/2022  ?Medication Sig  ? amLODipine (NORVASC) 5 MG tablet Take 5 mg by mouth daily.  ? anastrozole (ARIMIDEX) 1 MG tablet Take 1 tablet (1 mg total) by mouth daily.  ? atenolol (TENORMIN) 25 MG tablet Take 25 mg by mouth daily.  ? buPROPion (WELLBUTRIN XL) 150 MG 24 hr tablet Take 150 mg by mouth daily.  ? furosemide (LASIX) 20 MG tablet Take 10 mg by mouth daily.  ? insulin degludec (TRESIBA FLEXTOUCH) 100 UNIT/ML FlexTouch Pen Inject 15 Units into the skin at bedtime.  ? Insulin Pen Needle (PEN NEEDLES) 31G X 6 MM MISC Use to inject insulin once daily  ? levocetirizine (XYZAL) 5 MG tablet Take 1 tablet (5 mg total) by mouth daily as needed for allergies.  ? olmesartan (BENICAR) 5 MG tablet Take 5 mg by mouth daily.  ? simvastatin (ZOCOR) 20 MG tablet Take 1 tablet (20 mg total) by mouth at bedtime.  ? [DISCONTINUED] glipiZIDE (GLUCOTROL XL) 5  MG 24 hr tablet Take 5 mg by mouth daily.  ? TRIAMCINOLONE ACETONIDE, TOP, 0.05 % OINT 2 (two) times daily.  ? [DISCONTINUED] ascorbic acid (VITAMIN C) 500 MG tablet Take 1 tablet (500 mg total) by mouth daily. (Patient not taking: Reported on 02/01/2022)  ? ?No facility-administered encounter medications on file as of 02/01/2022.  ? ? ?ALLERGIES: ?No Known Allergies ? ?VACCINATION STATUS: ?Immunization History  ?Administered Date(s) Administered  ? Influenza Split 11/13/2011  ? Influenza Whole 07/20/2008, 08/10/2009, 10/18/2010  ? Influenza-Unspecified 07/29/2013, 08/12/2020  ? Pneumococcal Polysaccharide-23 07/20/2008  ? Td 07/29/1998, 08/10/2009  ? ? ?Diabetes ?She presents for her initial diabetic visit. She has type 2 diabetes mellitus. Her disease course has been fluctuating. There are no hypoglycemic associated symptoms. Associated symptoms include fatigue, polydipsia, polyuria and weight loss. Hypoglycemia complications include blackouts. (Had fall several times ?hypoglycemic event)  Symptoms are stable. Diabetic complications include nephropathy and peripheral neuropathy. Risk factors for coronary artery disease include diabetes mellitus, dyslipidemia, family history, hypertension, sedentary lifestyle, post-menopausal and obesity. Current diabetic treatment includes oral agent (monotherapy). She is compliant with treatment most of the time. Her weight is decreasing rapidly. She is following a generally unhealthy diet. When asked about meal planning, she reported none (gets meals on wheels). She has not had a previous visit with a dietitian. She participates in exercise intermittently. Her overall blood glucose range is >200 mg/dl. (She presents today, accompanied by her daughter, for her consultation.  Her most recent A1c was 9.8% on 1/17.  She was recently hospitalized after a fall and was found to have COVID, UTI, and rhabdo, daughter says she has "not been right" ever since the initial fall.  She has had multiple falls since then, even with home health/PT visiting her house.  She does not routinely monitor glucose, has a difficult time remembering to do it, therefore her glucose gets checked when her daughter can help.  Daughter, [name], reports recent "HI and 300-400" readings.  She gets meals on wheels but does not eat on a routine schedule- she does skip breakfast most days.  She drinks mostly sweet tea, milk, and water and snacks some.  She does not engage in routine physical activity, daughter reports she has been laying around more lately (even developed pressure injuries).  She is UTD on eye exam, has never seen podiatry in the past.) An ACE inhibitor/angiotensin II receptor blocker is being taken. She sees a podiatrist.Eye exam is current.  ?Hyperlipidemia ?This is a chronic problem. The current episode started more than 1 year ago. The problem is controlled. Recent lipid tests were reviewed and are variable. Exacerbating diseases include chronic renal disease, diabetes and obesity.  Factors aggravating her hyperlipidemia include beta blockers and fatty foods. Current antihyperlipidemic treatment includes statins. The current treatment provides moderate improvement of lipids. Compliance problems include adherence to diet and adherence to exercise.  Risk factors for coronary artery disease include diabetes mellitus, dyslipidemia, family history, hypertension, obesity, post-menopausal and a sedentary lifestyle.  ?Hypertension ?This is a chronic problem. The current episode started more than 1 year ago. The problem has been resolved since onset. The problem is controlled. There are no associated agents to hypertension. Risk factors for coronary artery disease include diabetes mellitus, dyslipidemia, family history, obesity and sedentary lifestyle. Past treatments include calcium channel blockers, angiotensin blockers and diuretics. There are no compliance problems.  Hypertensive end-organ damage includes kidney disease. Identifiable causes of hypertension include chronic renal disease.  ? ? ?Review of systems ? ?  Constitutional: + rapidly decreasing body weight, current Body mass index is 37.69 kg/m?., + fatigue, no subjective hyperthermia, no subjective hypothermia, increased thirst ?Eyes: no blurry vision, no xerophthalmia ?ENT: no sore throat, no nodules palpated in throat, no dysphagia/odynophagia, no hoarseness ?Cardiovascular: no chest pain, no shortness of breath, no palpitations, no leg swelling ?Respiratory: no cough, no shortness of breath ?Gastrointestinal: no nausea/vomiting/diarrhea ?Genitourinary: + polyuria ?Musculoskeletal: no muscle/joint aches- walks with cane ?Skin: no rashes, no hyperemia ?Neurological: no tremors, no numbness, no tingling, no dizziness ?Psychiatric: no depression, no anxiety ? ?Objective:  ?  ? ?BP 98/63   Pulse (!) 56   Ht 5' (1.524 m)   Wt 193 lb (87.5 kg)   BMI 37.69 kg/m?   ?Wt Readings from Last 3 Encounters:  ?02/01/22 193 lb (87.5 kg)  ?11/13/21 218  lb 0.6 oz (98.9 kg)  ?10/03/21 218 lb (98.9 kg)  ?  ? ?BP Readings from Last 3 Encounters:  ?02/01/22 98/63  ?11/15/21 (!) 148/67  ?05/17/21 (!) 138/59  ?  ? ?Physical Exam- Limited ? ?Constitutional:  Body mass

## 2022-02-01 NOTE — Patient Instructions (Signed)

## 2022-02-02 ENCOUNTER — Telehealth: Payer: Self-pay

## 2022-02-02 DIAGNOSIS — Z8616 Personal history of COVID-19: Secondary | ICD-10-CM | POA: Insufficient documentation

## 2022-02-02 NOTE — Telephone Encounter (Signed)
Started prior authorization for [place]. ?Key # [id number] ?

## 2022-02-05 ENCOUNTER — Other Ambulatory Visit: Payer: Self-pay

## 2022-02-05 MED ORDER — TOUJEO SOLOSTAR 300 UNIT/ML ~~LOC~~ SOPN: 15.0000 [IU] | PEN_INJECTOR | Freq: Every day | SUBCUTANEOUS | 1 refills | Status: DC

## 2022-02-05 NOTE — Telephone Encounter (Signed)
Ugh, was [place] on that list of meds that are covered?  She really needs an ultra long-lasting insulin to prevent hypoglycemia.

## 2022-02-05 NOTE — Telephone Encounter (Signed)
Yes, that will work

## 2022-02-05 NOTE — Telephone Encounter (Signed)
I received a fax from [employer] that they denied prior authorization request for [place] Flextouch U-100 pens. Do you want to write an appeal letter for me to fax? ?

## 2022-02-05 NOTE — Telephone Encounter (Signed)
I have sent in the prescription for Toujeo for the patient. ?

## 2022-02-05 NOTE — Telephone Encounter (Signed)
I looked back and I do believe Toujeo is listed. Do you want to send that in for her? ?

## 2022-02-06 ENCOUNTER — Other Ambulatory Visit: Payer: Self-pay

## 2022-02-06 DIAGNOSIS — R69 Illness, unspecified: Secondary | ICD-10-CM | POA: Diagnosis not present

## 2022-02-06 MED ORDER — INSULIN GLARGINE 100 UNIT/ML ~~LOC~~ SOLN: 15.0000 [IU] | Freq: Every day | SUBCUTANEOUS | 3 refills | Status: DC

## 2022-02-07 DIAGNOSIS — E1165 Type 2 diabetes mellitus with hyperglycemia: Secondary | ICD-10-CM | POA: Diagnosis not present

## 2022-02-07 NOTE — Telephone Encounter (Signed)
Toujeo is not covered by patients insurance and have sent in Lantus. ?

## 2022-02-08 DIAGNOSIS — E1165 Type 2 diabetes mellitus with hyperglycemia: Secondary | ICD-10-CM | POA: Diagnosis not present

## 2022-02-15 ENCOUNTER — Encounter: Payer: Self-pay | Admitting: Nurse Practitioner

## 2022-02-15 ENCOUNTER — Ambulatory Visit (INDEPENDENT_AMBULATORY_CARE_PROVIDER_SITE_OTHER): Payer: Medicare HMO | Admitting: Nurse Practitioner

## 2022-02-15 VITALS — BP 127/55 | HR 69 | Ht 60.0 in | Wt 196.0 lb

## 2022-02-15 DIAGNOSIS — N184 Chronic kidney disease, stage 4 (severe): Secondary | ICD-10-CM

## 2022-02-15 DIAGNOSIS — E1122 Type 2 diabetes mellitus with diabetic chronic kidney disease: Secondary | ICD-10-CM

## 2022-02-15 LAB — POCT GLYCOSYLATED HEMOGLOBIN (HGB A1C): HbA1c POC (<> result, manual entry): 13.2 % (ref 4.0–5.6)

## 2022-02-15 NOTE — Patient Instructions (Signed)
Diabetes Mellitus and Foot Care Foot care is an important part of your health, especially when you have diabetes. Diabetes may cause you to have problems because of poor blood flow (circulation) to your feet and legs, which can cause your skin to: Become thinner and drier. Break more easily. Heal more slowly. Peel and crack. You may also have nerve damage (neuropathy) in your legs and feet, causing decreased feeling in them. This means that you may not notice minor injuries to your feet that could lead to more serious problems. Noticing and addressing any potential problems early is the best way to prevent future foot problems. How to care for your feet Foot hygiene  Wash your feet daily with warm water and mild soap. Do not use hot water. Then, pat your feet and the areas between your toes until they are completely dry. Do not soak your feet as this can dry your skin. Trim your toenails straight across. Do not dig under them or around the cuticle. File the edges of your nails with an emery board or nail file. Apply a moisturizing lotion or petroleum jelly to the skin on your feet and to dry, brittle toenails. Use lotion that does not contain alcohol and is unscented. Do not apply lotion between your toes. Shoes and socks Wear clean socks or stockings every day. Make sure they are not too tight. Do not wear knee-high stockings since they may decrease blood flow to your legs. Wear shoes that fit properly and have enough cushioning. Always look in your shoes before you put them on to be sure there are no objects inside. To break in new shoes, wear them for just a few hours a day. This prevents injuries on your feet. Wounds, scrapes, corns, and calluses  Check your feet daily for blisters, cuts, bruises, sores, and redness. If you cannot see the bottom of your feet, use a mirror or ask someone for help. Do not cut corns or calluses or try to remove them with medicine. If you find a minor scrape,  cut, or break in the skin on your feet, keep it and the skin around it clean and dry. You may clean these areas with mild soap and water. Do not clean the area with peroxide, alcohol, or iodine. If you have a wound, scrape, corn, or callus on your foot, look at it several times a day to make sure it is healing and not infected. Check for: Redness, swelling, or pain. Fluid or blood. Warmth. Pus or a bad smell. General tips Do not cross your legs. This may decrease blood flow to your feet. Do not use heating pads or hot water bottles on your feet. They may burn your skin. If you have lost feeling in your feet or legs, you may not know this is happening until it is too late. Protect your feet from hot and cold by wearing shoes, such as at the beach or on hot pavement. Schedule a complete foot exam at least once a year (annually) or more often if you have foot problems. Report any cuts, sores, or bruises to your health care provider immediately. Where to find more information American Diabetes Association: www.diabetes.org Association of Diabetes Care & Education Specialists: www.diabeteseducator.org Contact a health care provider if: You have a medical condition that increases your risk of infection and you have any cuts, sores, or bruises on your feet. You have an injury that is not healing. You have redness on your legs or feet. You   feel burning or tingling in your legs or feet. You have pain or cramps in your legs and feet. Your legs or feet are numb. Your feet always feel cold. You have pain around any toenails. Get help right away if: You have a wound, scrape, corn, or callus on your foot and: You have pain, swelling, or redness that gets worse. You have fluid or blood coming from the wound, scrape, corn, or callus. Your wound, scrape, corn, or callus feels warm to the touch. You have pus or a bad smell coming from the wound, scrape, corn, or callus. You have a fever. You have a red  line going up your leg. Summary Check your feet every day for blisters, cuts, bruises, sores, and redness. Apply a moisturizing lotion or petroleum jelly to the skin on your feet and to dry, brittle toenails. Wear shoes that fit properly and have enough cushioning. If you have foot problems, report any cuts, sores, or bruises to your health care provider immediately. Schedule a complete foot exam at least once a year (annually) or more often if you have foot problems. This information is not intended to replace advice given to you by your health care provider. Make sure you discuss any questions you have with your health care provider. Document Revised: 05/05/2020 Document Reviewed: 05/05/2020 Elsevier Patient Education  2023 Elsevier Inc.  

## 2022-02-15 NOTE — Progress Notes (Signed)
? ?                                                    Endocrinology Follow Up Note  ?     02/15/2022, 11:57 AM ? ? ?Subjective:  ? ? Patient ID: Krista Payne, female    DOB: 1941/09/05.  ?Krista Payne is being seen in follow up after being seen in consultation for management of currently uncontrolled symptomatic diabetes requested by  Celene Squibb, MD. ? ? ?Past Medical History:  ?Diagnosis Date  ? Breast cancer ([place])   ? R  ? Chronic kidney disease   ? Diabetes mellitus without complication ([place])   ? Hyperlipidemia   ? Hypertension   ? Macular degeneration   ? Osteoarthritis   ? ? ?Past Surgical History:  ?Procedure Laterality Date  ? CATARACT EXTRACTION W/PHACO Right 02/07/2015  ? Procedure: CATARACT EXTRACTION PHACO AND INTRAOCULAR LENS PLACEMENT (IOC);  Surgeon: Tonny Branch, MD;  Location: [facility];  Service: Ophthalmology;  Laterality: Right;  CDE:12.72  ? CATARACT EXTRACTION W/PHACO Left 02/24/2015  ? Procedure: CATARACT EXTRACTION PHACO AND INTRAOCULAR LENS PLACEMENT LEFT EYE;  Surgeon: Tonny Branch, MD;  Location: [facility];  Service: Ophthalmology;  Laterality: Left;  CDE 11.69  ? COLONOSCOPY    ? ? ?Social History  ? ?Socioeconomic History  ? Marital status: Widowed  ?  Spouse name: Not on file  ? Number of children: 3  ? Years of education: Not on file  ? Highest education level: Not on file  ?Occupational History  ? Not on file  ?Tobacco Use  ? Smoking status: Never  ? Smokeless tobacco: Never  ?Vaping Use  ? Vaping Use: Never used  ?Substance and Sexual Activity  ? Alcohol use: No  ? Drug use: No  ? Sexual activity: Never  ?Other Topics Concern  ? Not on file  ?Social History Narrative  ? Not on file  ? ?Social Determinants of Health  ? ?Financial Resource Strain: Not on file  ?Food Insecurity: Not on file  ?Transportation Needs: Not on file  ?Physical Activity: Not on file  ?Stress: Not on file  ?Social Connections: Not on file  ? ? ?Family History  ?Problem Relation Age of Onset  ? Macular  degeneration Mother   ? Diabetes Father   ? Heart disease Daughter   ? ? ?Outpatient Encounter Medications as of 02/15/2022  ?Medication Sig  ? amLODipine (NORVASC) 5 MG tablet Take 5 mg by mouth daily.  ? anastrozole (ARIMIDEX) 1 MG tablet Take 1 tablet (1 mg total) by mouth daily.  ? atenolol (TENORMIN) 25 MG tablet Take 25 mg by mouth daily.  ? buPROPion (WELLBUTRIN XL) 150 MG 24 hr tablet Take 300 mg by mouth daily.  ? furosemide (LASIX) 20 MG tablet Take 10 mg by mouth daily.  ? insulin degludec (TRESIBA FLEXTOUCH) 100 UNIT/ML FlexTouch Pen Inject 15 Units into the skin at bedtime.  ? insulin glargine (LANTUS) 100 UNIT/ML injection Inject 0.15 mLs (15 Units total) into the skin at bedtime.  ? Insulin Pen Needle (PEN NEEDLES) 31G X 6 MM MISC Use to inject insulin once daily  ? levocetirizine (XYZAL) 5 MG tablet Take 1 tablet (5 mg total) by mouth daily as needed for allergies.  ? olmesartan (BENICAR) 5 MG tablet Take 5 mg by  mouth daily.  ? simvastatin (ZOCOR) 20 MG tablet Take 1 tablet (20 mg total) by mouth at bedtime.  ? [DISCONTINUED] TRIAMCINOLONE ACETONIDE, TOP, 0.05 % OINT 2 (two) times daily.  ? ?No facility-administered encounter medications on file as of 02/15/2022.  ? ? ?ALLERGIES: ?No Known Allergies ? ?VACCINATION STATUS: ?Immunization History  ?Administered Date(s) Administered  ? Influenza Split 11/13/2011  ? Influenza Whole 07/20/2008, 08/10/2009, 10/18/2010  ? Influenza-Unspecified 07/29/2013, 08/12/2020  ? Pneumococcal Polysaccharide-23 07/20/2008  ? Td 07/29/1998, 08/10/2009  ? ? ?Diabetes ?She presents for her follow-up diabetic visit. She has type 2 diabetes mellitus. Her disease course has been improving. There are no hypoglycemic associated symptoms. Associated symptoms include fatigue, polydipsia and polyuria. Pertinent negatives for diabetes include no weight loss. Hypoglycemia complications include blackouts. (Had fall several times ?hypoglycemic event) Symptoms are improving. Diabetic  complications include nephropathy and peripheral neuropathy. Risk factors for coronary artery disease include diabetes mellitus, dyslipidemia, family history, hypertension, sedentary lifestyle, post-menopausal and obesity. Current diabetic treatment includes insulin injections. She is compliant with treatment most of the time. Her weight is fluctuating minimally. She is following a generally unhealthy diet. When asked about meal planning, she reported none (gets meals on wheels). She has not had a previous visit with a dietitian. She participates in exercise intermittently. Her home blood glucose trend is decreasing steadily. Her breakfast blood glucose range is generally 140-180 mg/dl. Her overall blood glucose range is >200 mg/dl. (She presents today, accompanied by her daughter, with her logs showing improved yet still above target glycemic profile overall.  Her POCT A1c today is 13.2%, increasing from last A1c of 9.8%.  She has taken well to the new insulin injections, has been performing them herself without issue.  She did bring in her Dexcom for assistance with application today.) An ACE inhibitor/angiotensin II receptor blocker is being taken. She sees a podiatrist.Eye exam is current.  ?Hyperlipidemia ?This is a chronic problem. The current episode started more than 1 year ago. The problem is controlled. Recent lipid tests were reviewed and are variable. Exacerbating diseases include chronic renal disease, diabetes and obesity. Factors aggravating her hyperlipidemia include beta blockers and fatty foods. Current antihyperlipidemic treatment includes statins. The current treatment provides moderate improvement of lipids. Compliance problems include adherence to diet and adherence to exercise.  Risk factors for coronary artery disease include diabetes mellitus, dyslipidemia, family history, hypertension, obesity, post-menopausal and a sedentary lifestyle.  ?Hypertension ?This is a chronic problem. The current  episode started more than 1 year ago. The problem has been resolved since onset. The problem is controlled. There are no associated agents to hypertension. Risk factors for coronary artery disease include diabetes mellitus, dyslipidemia, family history, obesity and sedentary lifestyle. Past treatments include calcium channel blockers, angiotensin blockers and diuretics. There are no compliance problems.  Hypertensive end-organ damage includes kidney disease. Identifiable causes of hypertension include chronic renal disease.  ? ? ?Review of systems ? ?Constitutional: + Minimally fluctuating body weight,  current Body mass index is 38.28 kg/m?. , no fatigue, no subjective hyperthermia, no subjective hypothermia ?Eyes: no blurry vision, no xerophthalmia ?ENT: no sore throat, no nodules palpated in throat, no dysphagia/odynophagia, no hoarseness ?Cardiovascular: no chest pain, no shortness of breath, no palpitations, no leg swelling ?Respiratory: no cough, no shortness of breath ?Gastrointestinal: no nausea/vomiting/diarrhea ?Musculoskeletal: no muscle/joint aches, walks with cane ?Skin: no rashes, no hyperemia ?Neurological: no tremors, no numbness, no tingling, no dizziness ?Psychiatric: no depression, no anxiety ? ?Objective:  ?  ? ?  BP (!) 127/55   Pulse 69   Ht 5' (1.524 m)   Wt 196 lb (88.9 kg)   BMI 38.28 kg/m?   ?Wt Readings from Last 3 Encounters:  ?02/15/22 196 lb (88.9 kg)  ?02/01/22 193 lb (87.5 kg)  ?11/13/21 218 lb 0.6 oz (98.9 kg)  ?  ? ?BP Readings from Last 3 Encounters:  ?02/15/22 (!) 127/55  ?02/01/22 98/63  ?11/15/21 (!) 148/67  ?  ? ? ?Physical Exam- Limited ? ?Constitutional:  Body mass index is 38.28 kg/m?. , not in acute distress, normal state of mind ?Eyes:  EOMI, no exophthalmos ?Neck: Supple ?Cardiovascular: RRR, no murmurs, rubs, or gallops, no edema ?Respiratory: Adequate breathing efforts, no crackles, rales, rhonchi, or wheezing ?Musculoskeletal: no gross deformities, strength intact in  all four extremities, no gross restriction of joint movements, walks with cane ?Skin:  no rashes, no hyperemia ?Neurological: no tremor with outstretched hands ? ? ? ?CMP ( most recent) ?CMP  ?   ?Compon

## 2022-02-22 ENCOUNTER — Encounter (HOSPITAL_COMMUNITY): Payer: Self-pay

## 2022-02-22 ENCOUNTER — Emergency Department (HOSPITAL_COMMUNITY): Payer: Medicare HMO

## 2022-02-22 ENCOUNTER — Inpatient Hospital Stay (HOSPITAL_COMMUNITY)
Admission: EM | Admit: 2022-02-22 | Discharge: 2022-02-26 | DRG: 871 | Disposition: A | Discharge status: Home Health | Payer: Medicare HMO | Attending: Internal Medicine | Admitting: Internal Medicine

## 2022-02-22 ENCOUNTER — Other Ambulatory Visit: Payer: Self-pay

## 2022-02-22 ENCOUNTER — Inpatient Hospital Stay (HOSPITAL_COMMUNITY): Payer: Medicare HMO

## 2022-02-22 DIAGNOSIS — E1121 Type 2 diabetes mellitus with diabetic nephropathy: Secondary | ICD-10-CM

## 2022-02-22 DIAGNOSIS — Z6836 Body mass index (BMI) 36.0-36.9, adult: Secondary | ICD-10-CM

## 2022-02-22 DIAGNOSIS — E1122 Type 2 diabetes mellitus with diabetic chronic kidney disease: Secondary | ICD-10-CM | POA: Diagnosis not present

## 2022-02-22 DIAGNOSIS — N390 Urinary tract infection, site not specified: Secondary | ICD-10-CM | POA: Diagnosis present

## 2022-02-22 DIAGNOSIS — Z28310 Unvaccinated for covid-19: Secondary | ICD-10-CM

## 2022-02-22 DIAGNOSIS — E782 Mixed hyperlipidemia: Secondary | ICD-10-CM | POA: Diagnosis present

## 2022-02-22 DIAGNOSIS — Z7985 Long-term (current) use of injectable non-insulin antidiabetic drugs: Secondary | ICD-10-CM

## 2022-02-22 DIAGNOSIS — W1830XA Fall on same level, unspecified, initial encounter: Secondary | ICD-10-CM | POA: Diagnosis present

## 2022-02-22 DIAGNOSIS — E872 Acidosis, unspecified: Secondary | ICD-10-CM | POA: Diagnosis present

## 2022-02-22 DIAGNOSIS — S0011XA Contusion of right eyelid and periocular area, initial encounter: Secondary | ICD-10-CM | POA: Diagnosis present

## 2022-02-22 DIAGNOSIS — Z853 Personal history of malignant neoplasm of breast: Secondary | ICD-10-CM

## 2022-02-22 DIAGNOSIS — I10 Essential (primary) hypertension: Secondary | ICD-10-CM | POA: Diagnosis not present

## 2022-02-22 DIAGNOSIS — Z79899 Other long term (current) drug therapy: Secondary | ICD-10-CM

## 2022-02-22 DIAGNOSIS — Z8616 Personal history of COVID-19: Secondary | ICD-10-CM | POA: Diagnosis not present

## 2022-02-22 DIAGNOSIS — A419 Sepsis, unspecified organism: Secondary | ICD-10-CM | POA: Diagnosis present

## 2022-02-22 DIAGNOSIS — Z833 Family history of diabetes mellitus: Secondary | ICD-10-CM | POA: Diagnosis not present

## 2022-02-22 DIAGNOSIS — H3530 Unspecified macular degeneration: Secondary | ICD-10-CM | POA: Diagnosis not present

## 2022-02-22 DIAGNOSIS — M1990 Unspecified osteoarthritis, unspecified site: Secondary | ICD-10-CM | POA: Diagnosis present

## 2022-02-22 DIAGNOSIS — L89151 Pressure ulcer of sacral region, stage 1: Secondary | ICD-10-CM | POA: Diagnosis not present

## 2022-02-22 DIAGNOSIS — Z1629 Resistance to other single specified antibiotic: Secondary | ICD-10-CM | POA: Diagnosis present

## 2022-02-22 DIAGNOSIS — Y92009 Unspecified place in unspecified non-institutional (private) residence as the place of occurrence of the external cause: Secondary | ICD-10-CM

## 2022-02-22 DIAGNOSIS — N179 Acute kidney failure, unspecified: Secondary | ICD-10-CM | POA: Diagnosis not present

## 2022-02-22 DIAGNOSIS — I6782 Cerebral ischemia: Secondary | ICD-10-CM | POA: Diagnosis not present

## 2022-02-22 DIAGNOSIS — N289 Disorder of kidney and ureter, unspecified: Secondary | ICD-10-CM | POA: Diagnosis not present

## 2022-02-22 DIAGNOSIS — Z794 Long term (current) use of insulin: Secondary | ICD-10-CM

## 2022-02-22 DIAGNOSIS — W19XXXA Unspecified fall, initial encounter: Secondary | ICD-10-CM

## 2022-02-22 DIAGNOSIS — N1832 Chronic kidney disease, stage 3b: Secondary | ICD-10-CM | POA: Diagnosis present

## 2022-02-22 DIAGNOSIS — G9341 Metabolic encephalopathy: Secondary | ICD-10-CM | POA: Diagnosis not present

## 2022-02-22 DIAGNOSIS — Z043 Encounter for examination and observation following other accident: Secondary | ICD-10-CM | POA: Diagnosis not present

## 2022-02-22 DIAGNOSIS — Z20822 Contact with and (suspected) exposure to covid-19: Secondary | ICD-10-CM | POA: Diagnosis not present

## 2022-02-22 DIAGNOSIS — G319 Degenerative disease of nervous system, unspecified: Secondary | ICD-10-CM | POA: Diagnosis not present

## 2022-02-22 DIAGNOSIS — E1165 Type 2 diabetes mellitus with hyperglycemia: Secondary | ICD-10-CM | POA: Diagnosis not present

## 2022-02-22 DIAGNOSIS — A4151 Sepsis due to Escherichia coli [E. coli]: Secondary | ICD-10-CM | POA: Diagnosis not present

## 2022-02-22 DIAGNOSIS — I129 Hypertensive chronic kidney disease with stage 1 through stage 4 chronic kidney disease, or unspecified chronic kidney disease: Secondary | ICD-10-CM | POA: Diagnosis present

## 2022-02-22 DIAGNOSIS — R6520 Severe sepsis without septic shock: Secondary | ICD-10-CM | POA: Diagnosis not present

## 2022-02-22 DIAGNOSIS — R531 Weakness: Secondary | ICD-10-CM | POA: Diagnosis not present

## 2022-02-22 DIAGNOSIS — E785 Hyperlipidemia, unspecified: Secondary | ICD-10-CM | POA: Diagnosis present

## 2022-02-22 LAB — COMPREHENSIVE METABOLIC PANEL
ALT: 15 U/L (ref 0–44)
AST: 23 U/L (ref 15–41)
Albumin: 3.5 g/dL (ref 3.5–5.0)
Alkaline Phosphatase: 68 U/L (ref 38–126)
Anion gap: 11 (ref 5–15)
BUN: 38 mg/dL — ABNORMAL HIGH (ref 8–23)
CO2: 20 mmol/L — ABNORMAL LOW (ref 22–32)
Calcium: 9.9 mg/dL (ref 8.9–10.3)
Chloride: 99 mmol/L (ref 98–111)
Creatinine, Ser: 2.13 mg/dL — ABNORMAL HIGH (ref 0.44–1.00)
GFR, Estimated: 23 mL/min — ABNORMAL LOW (ref >60)
Glucose, Bld: 292 mg/dL — ABNORMAL HIGH (ref 70–99)
Potassium: 4.9 mmol/L (ref 3.5–5.1)
Sodium: 130 mmol/L — ABNORMAL LOW (ref 135–145)
Total Bilirubin: 0.8 mg/dL (ref 0.3–1.2)
Total Protein: 7.5 g/dL (ref 6.5–8.1)

## 2022-02-22 LAB — URINALYSIS, ROUTINE W REFLEX MICROSCOPIC
Bilirubin Urine: NEGATIVE
Glucose, UA: >=500 mg/dL — AB
Ketones, ur: NEGATIVE mg/dL
Nitrite: NEGATIVE
Protein, ur: 100 mg/dL — AB
Specific Gravity, Urine: 1.009 (ref 1.005–1.030)
WBC, UA: >50 WBC/hpf — ABNORMAL HIGH (ref 0–5)
pH: 5 (ref 5.0–8.0)

## 2022-02-22 LAB — APTT: aPTT: 30 seconds (ref 24–36)

## 2022-02-22 LAB — CBC WITH DIFFERENTIAL/PLATELET
Abs Immature Granulocytes: 0.13 10*3/uL — ABNORMAL HIGH (ref 0.00–0.07)
Basophils Absolute: 0 10*3/uL (ref 0.0–0.1)
Basophils Relative: 0 %
Eosinophils Absolute: 0 10*3/uL (ref 0.0–0.5)
Eosinophils Relative: 0 %
HCT: 33.7 % — ABNORMAL LOW (ref 36.0–46.0)
Hemoglobin: 10.7 g/dL — ABNORMAL LOW (ref 12.0–15.0)
Immature Granulocytes: 1 %
Lymphocytes Relative: 3 %
Lymphs Abs: 0.6 10*3/uL — ABNORMAL LOW (ref 0.7–4.0)
MCH: 28.2 pg (ref 26.0–34.0)
MCHC: 31.8 g/dL (ref 30.0–36.0)
MCV: 88.7 fL (ref 80.0–100.0)
Monocytes Absolute: 1.6 10*3/uL — ABNORMAL HIGH (ref 0.1–1.0)
Monocytes Relative: 8 %
Neutro Abs: 16.6 10*3/uL — ABNORMAL HIGH (ref 1.7–7.7)
Neutrophils Relative %: 88 %
Platelets: 394 10*3/uL (ref 150–400)
RBC: 3.8 MIL/uL — ABNORMAL LOW (ref 3.87–5.11)
RDW: 14.9 % (ref 11.5–15.5)
WBC: 19 10*3/uL — ABNORMAL HIGH (ref 4.0–10.5)
nRBC: 0 % (ref 0.0–0.2)

## 2022-02-22 LAB — CBG MONITORING, ED
Glucose-Capillary: 289 mg/dL — ABNORMAL HIGH (ref 70–99)
Glucose-Capillary: 270 mg/dL — ABNORMAL HIGH (ref 70–99)

## 2022-02-22 LAB — PROCALCITONIN: Procalcitonin: 6.32 ng/mL

## 2022-02-22 LAB — PROTIME-INR
INR: 1.1 (ref 0.8–1.2)
Prothrombin Time: 14.3 seconds (ref 11.4–15.2)

## 2022-02-22 LAB — LACTIC ACID, PLASMA
Lactic Acid, Venous: 2.3 mmol/L (ref 0.5–1.9)
Lactic Acid, Venous: 1 mmol/L (ref 0.5–1.9)

## 2022-02-22 LAB — CK: Total CK: 126 U/L (ref 38–234)

## 2022-02-22 IMAGING — CT CT HEAD W/O CM
4 series · 16 of 47 positions shown, 18 images · non-contrast
Comparison: CT brain [DATE]

CLINICAL DATA: Multiple fall



[Series 2: head w o · axial · 0.42mm/px · z∈[+249,+364]mm · 7 of 31 slices shown, 9 images]
[im 4/31  brain]
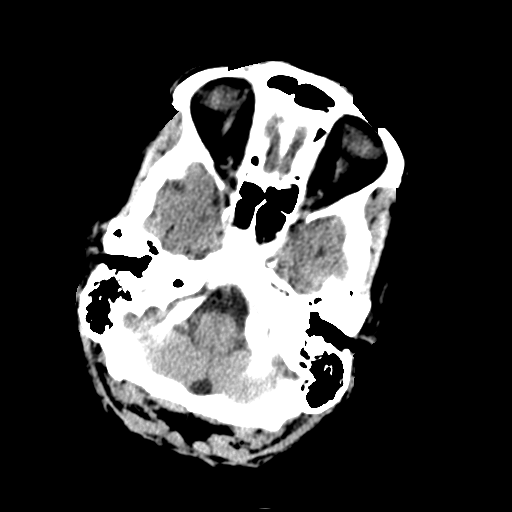
[im 4/31  bone]
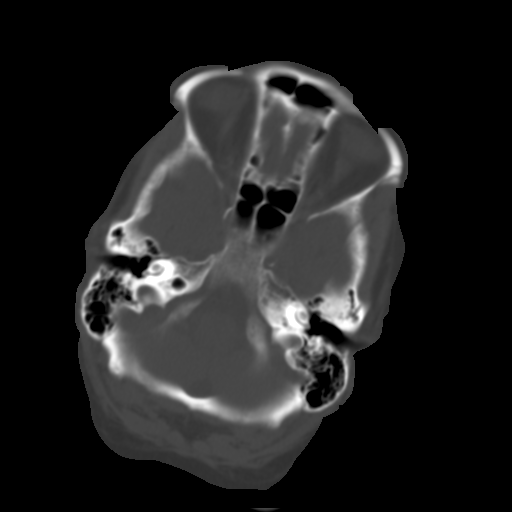
[im 8/31  brain]
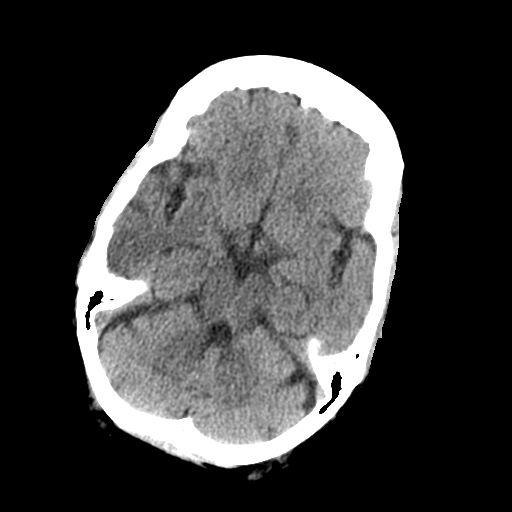
[im 12/31  brain]
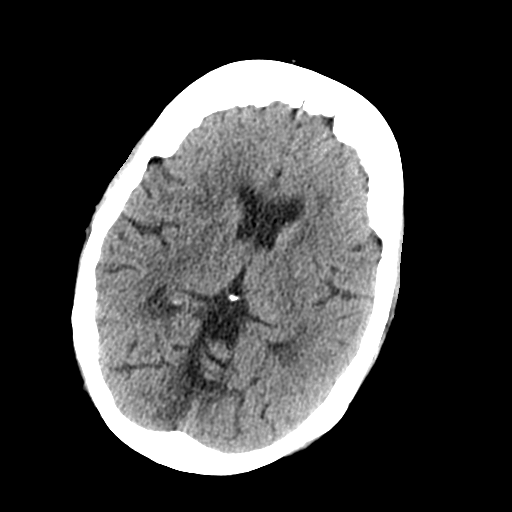
[im 16/31  brain]
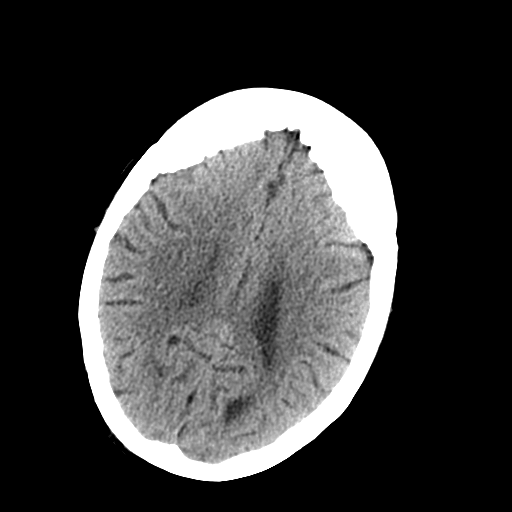
[im 19/31  brain]
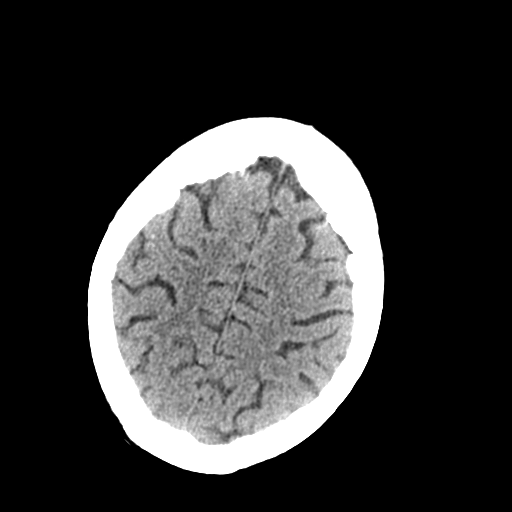
[im 19/31  bone]
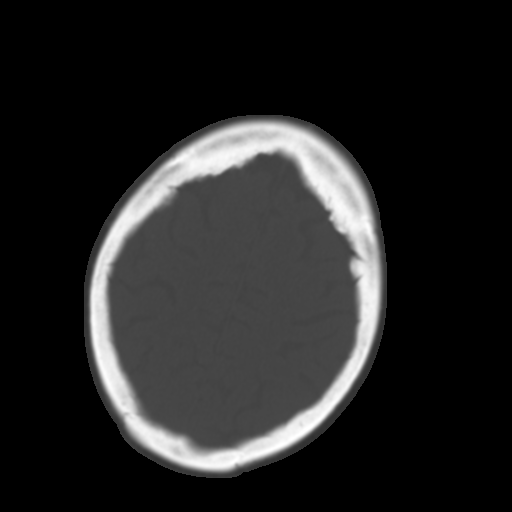
[im 23/31  brain]
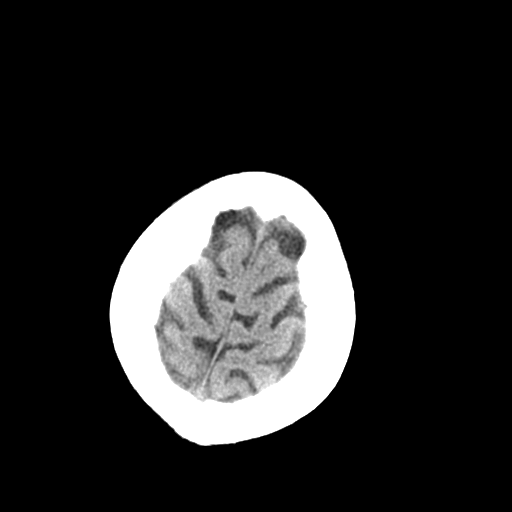
[im 27/31  brain]
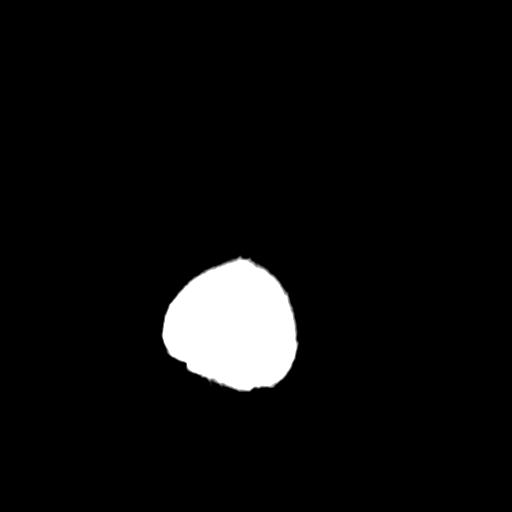

[Series 3: head bone · axial · 0.42mm/px · z∈[+248,+278]mm · 3 of 76 slices shown]
[im 8/76  bone]
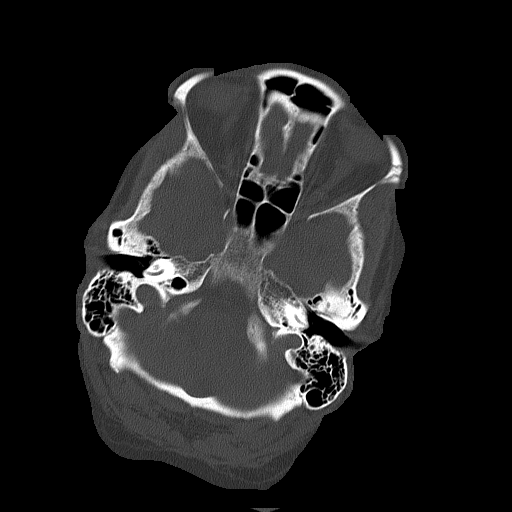
[im 16/76  bone]
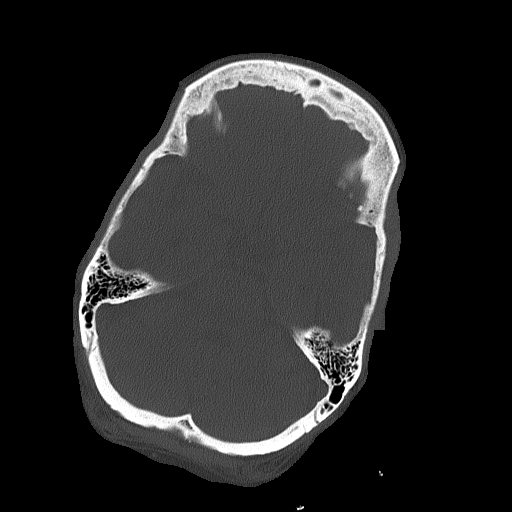
[im 23/76  bone]
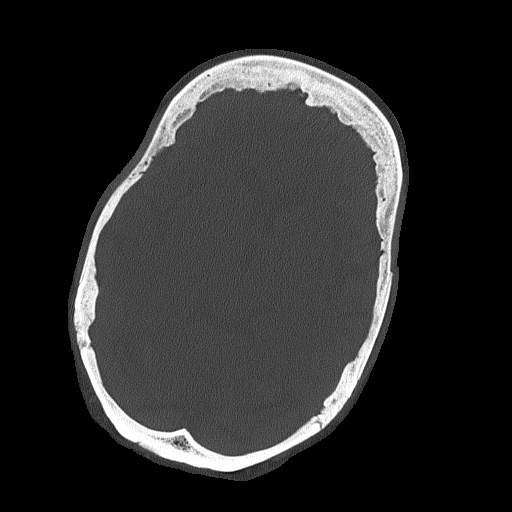

[Series 4: coronal soft · coronal · 0.29mm/px · 3 of 63 slices shown]
[im 21/63  brain]
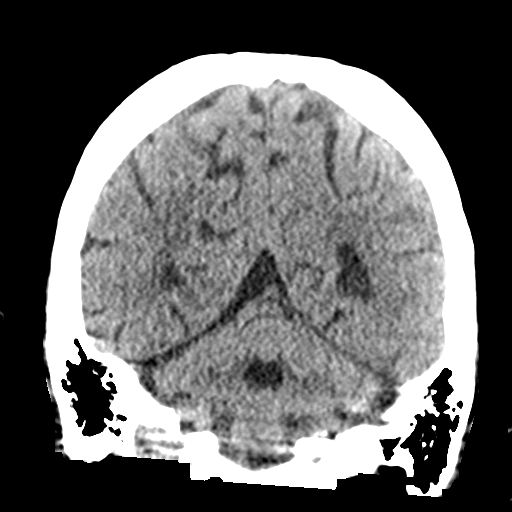
[im 28/63  brain]
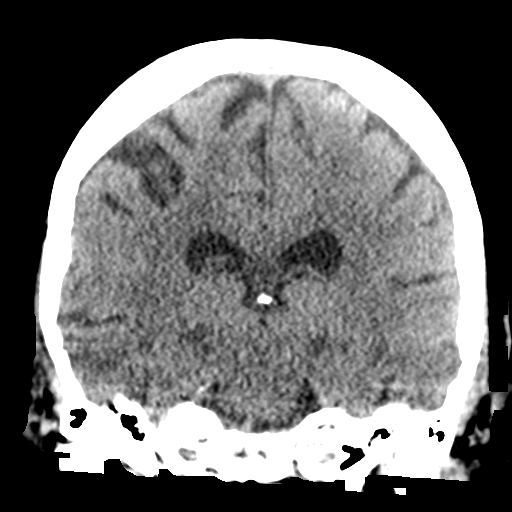
[im 35/63  brain]
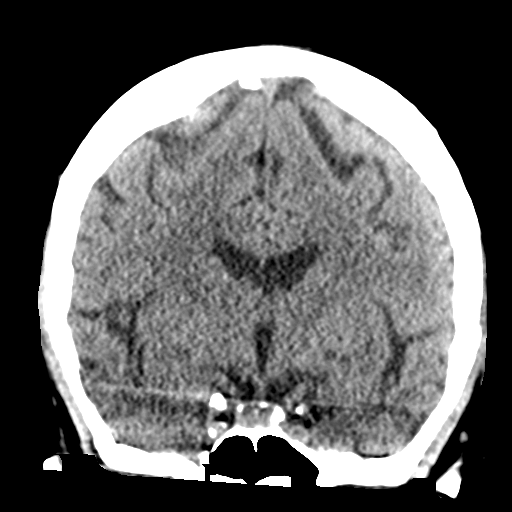

[Series 5: sagittal soft · sagittal · 0.27mm/px · 3 of 53 slices shown]
[im 18/53  brain]
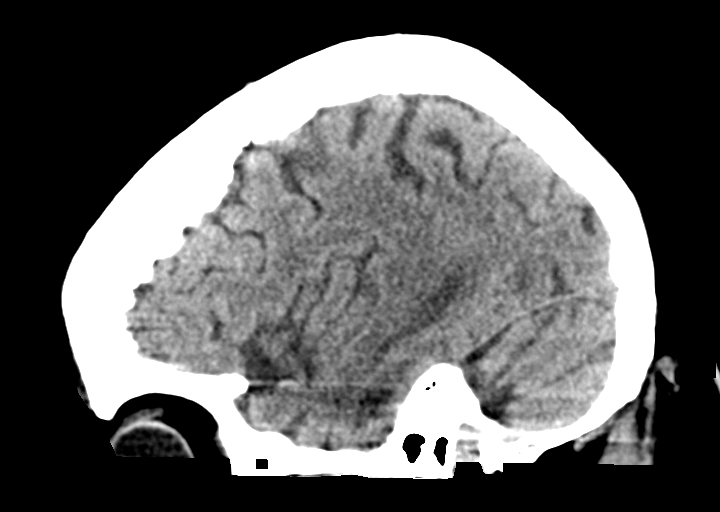
[im 27/53  brain]
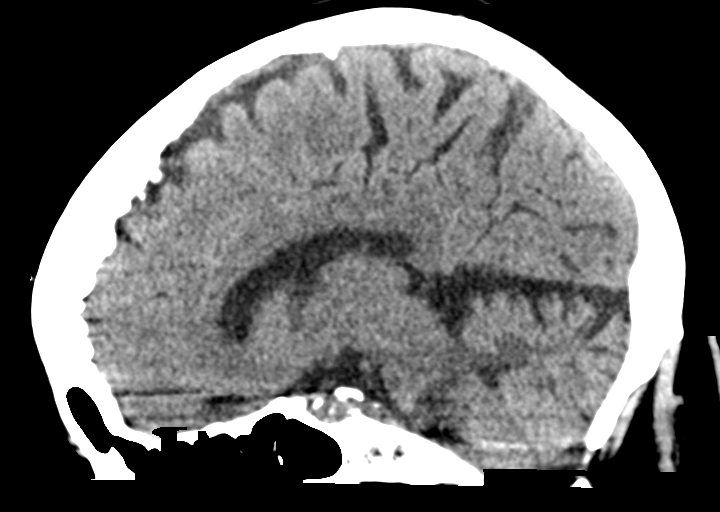
[im 35/53  brain]
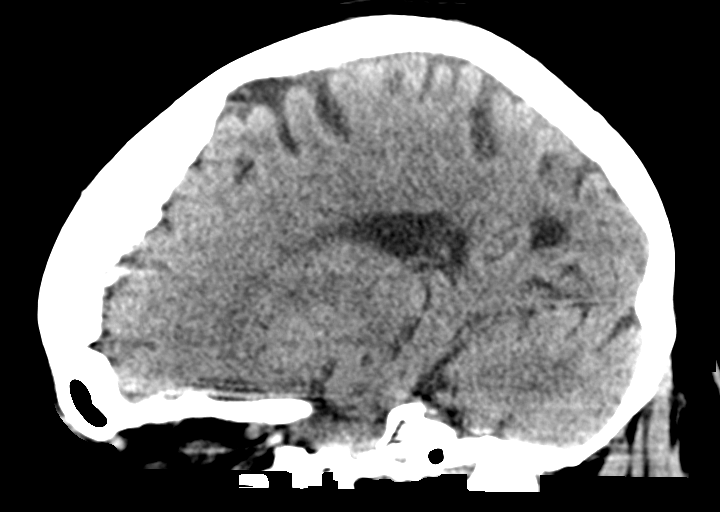

[16 of 47 positions shown; findings below may reference images not displayed]

FINDINGS: Brain: No acute territorial infarction, hemorrhage or intracranial
mass. Mild atrophy. Patchy white matter hypodensity consistent with
chronic small vessel ischemic change. Stable ventricle size.

Vascular: No hyperdense vessels.  Carotid vascular calcification

Skull: Normal. Negative for fracture or focal lesion.

Sinuses/Orbits: No acute finding.

Other: None
IMPRESSION: 1. No CT evidence for acute intracranial abnormality.
2. Mild atrophy and chronic small vessel ischemic changes of the
white matter.

## 2022-02-22 IMAGING — DX DG CHEST 1V PORT
1 series · 1 of 1 positions shown · non-contrast
Comparison: None.

CLINICAL DATA: Weakness.  Sepsis protocol.

EXAM:
PORTABLE CHEST 1 VIEW

[chest ap]
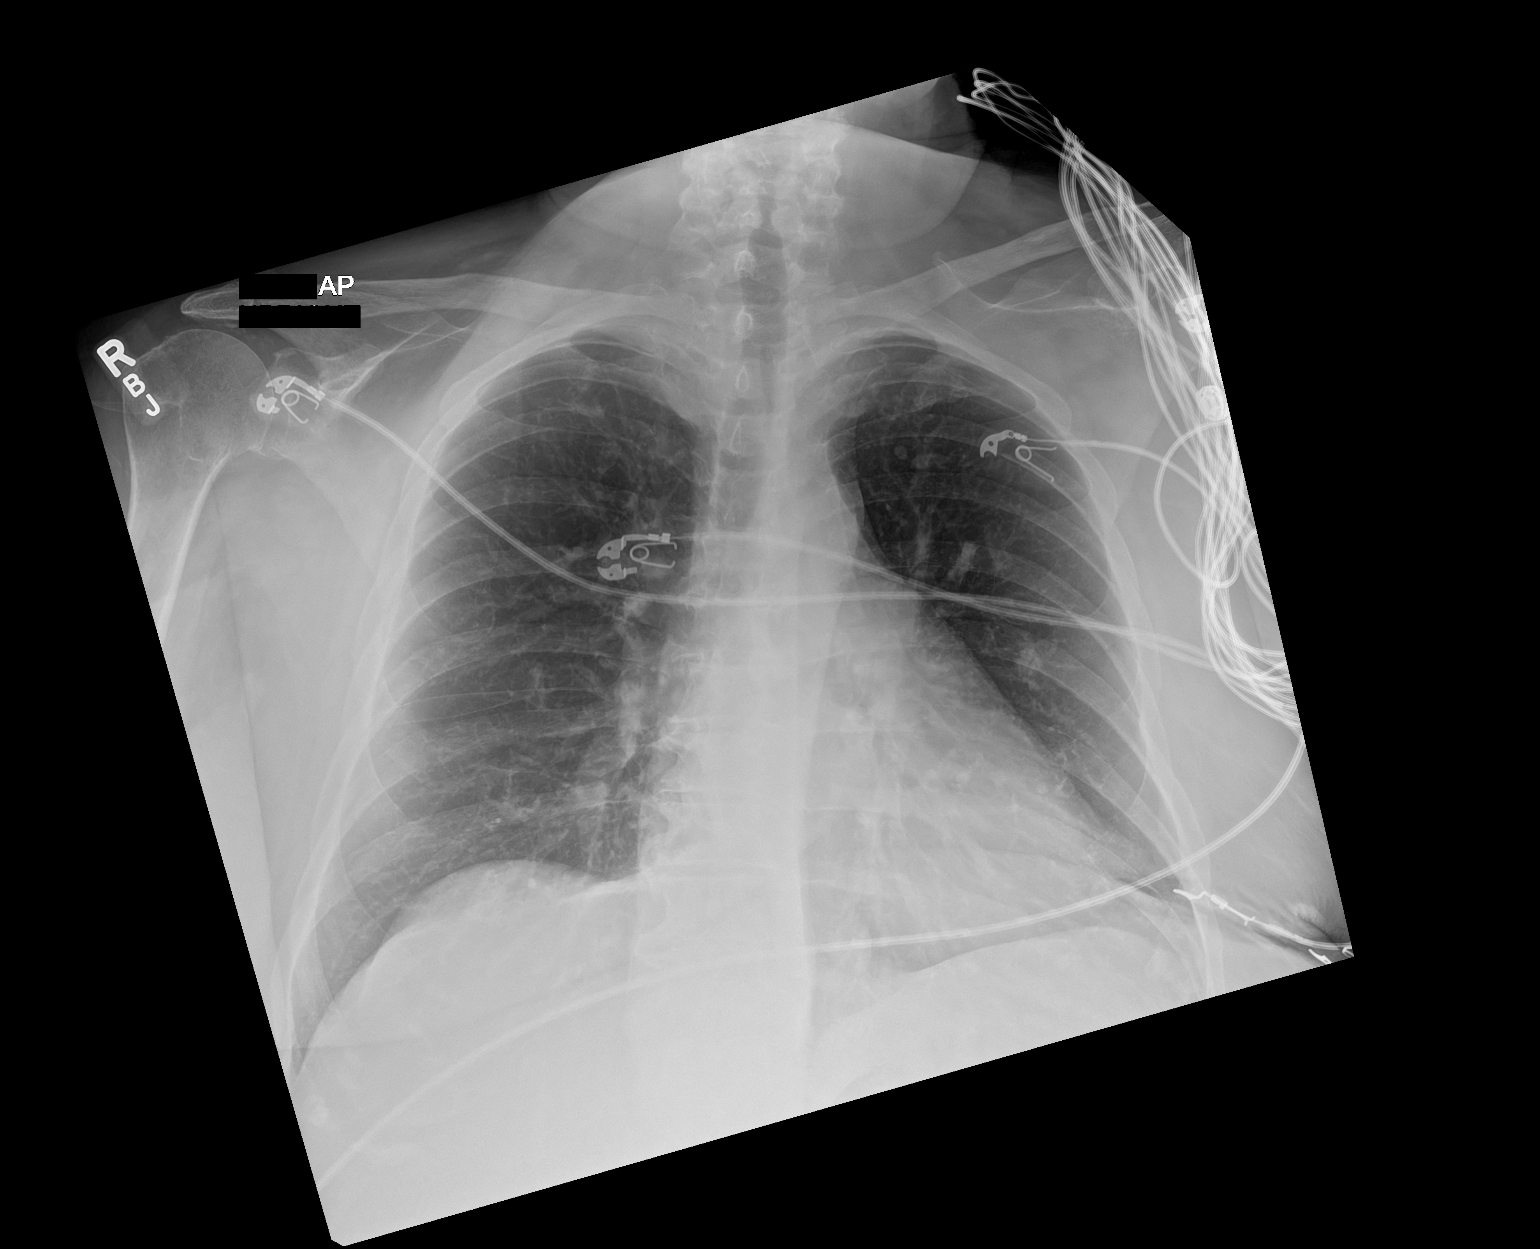

[1 of 1 positions shown; findings below may reference images not displayed]

FINDINGS: Heart is upper limits of normal for size. This is exaggerated by low
lung volumes. No edema or effusion is present. No focal airspace
disease is present. The visualized soft tissues and bony thorax are
unremarkable.
IMPRESSION: 1. Low lung volumes.
2. No acute cardiopulmonary disease.

## 2022-02-22 MED ORDER — INSULIN DETEMIR 100 UNIT/ML ~~LOC~~ SOLN
8.0000 [IU] | Freq: Every day | SUBCUTANEOUS | Status: DC
  Administered 2022-02-23 – 2022-02-25 (×4): 8 [IU] via SUBCUTANEOUS
  Filled 2022-02-22 (×5): qty 0.08

## 2022-02-22 MED ORDER — HEPARIN SODIUM (PORCINE) 5000 UNIT/ML IJ SOLN
5000.0000 [IU] | Freq: Three times a day (TID) | INTRAMUSCULAR | Status: DC
  Administered 2022-02-22 – 2022-02-25 (×10): 5000 [IU] via SUBCUTANEOUS
  Filled 2022-02-22 (×11): qty 1

## 2022-02-22 MED ORDER — ACETAMINOPHEN 650 MG RE SUPP: 650.0000 mg | Freq: Four times a day (QID) | RECTAL | Status: DC | PRN

## 2022-02-22 MED ORDER — BUPROPION HCL ER (XL) 300 MG PO TB24
300.0000 mg | ORAL_TABLET | Freq: Every day | ORAL | Status: DC
  Administered 2022-02-23 – 2022-02-26 (×4): 300 mg via ORAL
  Filled 2022-02-22 (×4): qty 1

## 2022-02-22 MED ORDER — ONDANSETRON HCL 4 MG PO TABS: 4.0000 mg | ORAL_TABLET | Freq: Four times a day (QID) | ORAL | Status: DC | PRN

## 2022-02-22 MED ORDER — ACETAMINOPHEN 325 MG PO TABS
650.0000 mg | ORAL_TABLET | Freq: Four times a day (QID) | ORAL | Status: DC | PRN
Start: 2022-02-22 — End: 2022-02-26
  Administered 2022-02-23 (×2): 650 mg via ORAL
  Filled 2022-02-22 (×2): qty 2

## 2022-02-22 MED ORDER — ATENOLOL 25 MG PO TABS
25.0000 mg | ORAL_TABLET | Freq: Every day | ORAL | Status: DC
  Administered 2022-02-23 – 2022-02-26 (×4): 25 mg via ORAL
  Filled 2022-02-22 (×4): qty 1

## 2022-02-22 MED ORDER — SIMVASTATIN 20 MG PO TABS
20.0000 mg | ORAL_TABLET | Freq: Every day | ORAL | Status: DC
  Administered 2022-02-22 – 2022-02-25 (×4): 20 mg via ORAL
  Filled 2022-02-22 (×3): qty 1
  Filled 2022-02-22: qty 2

## 2022-02-22 MED ORDER — ACETAMINOPHEN 500 MG PO TABS
1000.0000 mg | ORAL_TABLET | Freq: Once | ORAL | Status: AC
Start: 2022-02-22 — End: 2022-02-22
  Administered 2022-02-22: 1000 mg via ORAL
  Filled 2022-02-22: qty 2

## 2022-02-22 MED ORDER — OXYCODONE HCL 5 MG PO TABS: 5.0000 mg | ORAL_TABLET | ORAL | Status: DC | PRN

## 2022-02-22 MED ORDER — LORATADINE 10 MG PO TABS: 10.0000 mg | ORAL_TABLET | Freq: Every day | ORAL | Status: DC | PRN

## 2022-02-22 MED ORDER — SODIUM CHLORIDE 0.9 % IV SOLN
2.0000 g | INTRAVENOUS | Status: DC
  Administered 2022-02-22 – 2022-02-25 (×4): 2 g via INTRAVENOUS
  Filled 2022-02-22 (×4): qty 20

## 2022-02-22 MED ORDER — LEVOCETIRIZINE DIHYDROCHLORIDE 5 MG PO TABS: 5.0000 mg | ORAL_TABLET | Freq: Every day | ORAL | Status: DC | PRN

## 2022-02-22 MED ORDER — ONDANSETRON HCL 4 MG/2ML IJ SOLN: 4.0000 mg | Freq: Four times a day (QID) | INTRAMUSCULAR | Status: DC | PRN

## 2022-02-22 MED ORDER — SODIUM CHLORIDE 0.9 % IV BOLUS
1000.0000 mL | Freq: Once | INTRAVENOUS | Status: AC
Start: 2022-02-22 — End: 2022-02-22
  Administered 2022-02-22: 1000 mL via INTRAVENOUS

## 2022-02-22 MED ORDER — INSULIN ASPART 100 UNIT/ML IJ SOLN
0.0000 [IU] | Freq: Every day | INTRAMUSCULAR | Status: DC
  Administered 2022-02-22: 3 [IU] via SUBCUTANEOUS
  Administered 2022-02-24 – 2022-02-25 (×2): 2 [IU] via SUBCUTANEOUS
  Filled 2022-02-22: qty 1

## 2022-02-22 MED ORDER — SODIUM CHLORIDE 0.9 % IV SOLN: INTRAVENOUS | Status: AC

## 2022-02-22 MED ORDER — INSULIN ASPART 100 UNIT/ML IJ SOLN
0.0000 [IU] | Freq: Three times a day (TID) | INTRAMUSCULAR | Status: DC
  Administered 2022-02-23: 3 [IU] via SUBCUTANEOUS
  Administered 2022-02-23: 5 [IU] via SUBCUTANEOUS
  Administered 2022-02-24: 2 [IU] via SUBCUTANEOUS
  Administered 2022-02-24: 5 [IU] via SUBCUTANEOUS
  Administered 2022-02-24: 3 [IU] via SUBCUTANEOUS
  Administered 2022-02-25: 2 [IU] via SUBCUTANEOUS
  Administered 2022-02-25: 3 [IU] via SUBCUTANEOUS
  Administered 2022-02-25 – 2022-02-26 (×2): 5 [IU] via SUBCUTANEOUS
  Administered 2022-02-26: 2 [IU] via SUBCUTANEOUS

## 2022-02-22 NOTE — Assessment & Plan Note (Addendum)
-  In the setting of UTI/sepsis. ?-Resolved and with mentation back to baseline at discharge ?-Complete antibiotic therapy and maintain adequate hydration. ?

## 2022-02-22 NOTE — Assessment & Plan Note (Addendum)
-  Creatinine 2.13 at time of admission ?-Secondary to sepsis, prerenal azotemia and continue use of nephrotoxic agents. ?-Continue to minimize the use of nephrotoxic agents and maintain adequate hydration ?-Creatinine back to baseline at discharge. ? ?

## 2022-02-22 NOTE — Assessment & Plan Note (Addendum)
Continue statin 

## 2022-02-22 NOTE — Assessment & Plan Note (Addendum)
-  Last hemoglobin A1c 13.2 ?-Modified carbohydrate diet has been discussed with patient ?-Home dose hypoglycemic agent has been resumed; recommending close follow-up with PCP to further adjust her regimen as needed. ? ?

## 2022-02-22 NOTE — ED Triage Notes (Signed)
Pt brought in by neighbors for multiple falls today.  Reports generalized weakness.  Bgl 289 in triage.  Reports having incontinent episodes.  Pt has some confusion.  Pt is alert to person and place and confused on time.  States that she was in bed most of the day and hasnt eaten or drank anything.  Pt states that she lives by herself.  States taht friends helped her off the floor when she fell.  Resp even and unlabored.   Hypotensive in triage. 92/49 ?

## 2022-02-22 NOTE — Assessment & Plan Note (Addendum)
-  Blood pressure stable and rising at time of discharge. ?-Resume home antihypertensive regimen ?-Patient advised to follow heart healthy diet ?-Reassess blood pressure at follow-up visit with further adjustment to antihypertensive regimen as needed. ? ? ?

## 2022-02-22 NOTE — Assessment & Plan Note (Addendum)
-  Sepsis features adequately improving with current therapy. ?-Continue to maintain adequate hydration and complete antibiotic therapy as instructed. ?-At discharge patient's sepsis features essentially resolved. ?-Patient met sepsis criteria at time of admission.  Appear to be secondary to E. coli UTI and bacteremia. ?-Microorganism resistant to Bactrim and ampicillin. ? ?

## 2022-02-22 NOTE — Assessment & Plan Note (Addendum)
-  CT head is without acute findings ?-No reported injury ?-PT eval and treat has recommended home health PT; which has been arranged at discharge.Marland Kitchen ?

## 2022-02-22 NOTE — Assessment & Plan Note (Addendum)
-  Secondary to sepsis ?-Resolved after fluid resuscitation ?-Patient advised to maintain adequate hydration. ? ?

## 2022-02-22 NOTE — ED Provider Notes (Signed)
?[facility] [place] ?Provider Note ? ? ?CSN: [id number] ?Arrival date & time: 02/22/22  1947 ? ?  ? ?History ? ?Chief Complaint  ?Patient presents with  ? Fall  ? ? ?Krista Payne is a 81 y.o. female. ? ? ?Fall ? ? ?This patient is an 81 year old female, history of hypertension on amlodipine, atenolol, furosemide and olmesartan.  She is also on insulin for her diabetes and lives by herself here locally.  She states that today she has had 2 falls, she has been feeling very weak and losing her balance and falling, once in her bedroom and once in her bathroom.  After her second fall and having to call her friends and family over to help her get up off of the ground today they decided to bring her to the hospital.  She was very weak, she states that she has not had anything to eat or drink today though she does recall [employer] bringing something over.  She thinks she might of had a bite but cannot remember.  She has no focal pain, no headache, no neck pain, no chest pain and does not feel like she is coughing or having any shortness of breath.  On arrival the patient's blood pressure was 92/49. ? ?Home Medications ?Prior to Admission medications   ?Medication Sig Start Date End Date Taking? Authorizing Provider  ?amLODipine (NORVASC) 5 MG tablet Take 5 mg by mouth daily.    [provider]  ?anastrozole (ARIMIDEX) 1 MG tablet Take 1 tablet (1 mg total) by mouth daily. 02/27/21   Derek Jack, MD  ?atenolol (TENORMIN) 25 MG tablet Take 25 mg by mouth daily.    [provider]  ?buPROPion (WELLBUTRIN XL) 150 MG 24 hr tablet Take 300 mg by mouth daily.    [provider]  ?furosemide (LASIX) 20 MG tablet Take 10 mg by mouth daily. 03/08/21   [provider]  ?insulin degludec (TRESIBA FLEXTOUCH) 100 UNIT/ML FlexTouch Pen Inject 15 Units into the skin at bedtime. 02/01/22   Brita Romp, NP  ?insulin glargine (LANTUS) 100 UNIT/ML injection Inject 0.15 mLs (15  Units total) into the skin at bedtime. 02/06/22   Brita Romp, NP  ?Insulin Pen Needle (PEN NEEDLES) 31G X 6 MM MISC Use to inject insulin once daily 02/01/22   Brita Romp, NP  ?levocetirizine (XYZAL) 5 MG tablet Take 1 tablet (5 mg total) by mouth daily as needed for allergies. 11/15/21   Johnson, Clanford L, MD  ?olmesartan (BENICAR) 5 MG tablet Take 5 mg by mouth daily. 11/03/21   [provider]  ?simvastatin (ZOCOR) 20 MG tablet Take 1 tablet (20 mg total) by mouth at bedtime. 11/22/21   Murlean Iba, MD  ?   ? ?Allergies    ?Patient has no known allergies.   ? ?Review of Systems   ?Review of Systems  ?All other systems reviewed and are negative. ? ?Physical Exam ?Updated Vital Signs ?BP (!) 130/47   Pulse 76   Temp (!) 100.9 ?F (38.3 ?C) (Rectal)   Resp 20   Ht 1.524 m (5')   Wt 88 kg   SpO2 92%   BMI 37.89 kg/m?  ?Physical Exam ?Vitals and nursing note reviewed.  ?Constitutional:   ?   General: She is not in acute distress. ?   Appearance: She is well-developed.  ?HENT:  ?   Head: Normocephalic.  ?   Mouth/Throat:  ?   Mouth: Mucous  membranes are dry.  ?   Pharynx: No oropharyngeal exudate.  ?Eyes:  ?   General: No scleral icterus.    ?   Right eye: No discharge.     ?   Left eye: No discharge.  ?   Conjunctiva/sclera: Conjunctivae normal.  ?   Pupils: Pupils are equal, round, and reactive to light.  ?   Comments: Slight periorbital hematoma to the right eye, no tenderness of the orbital rim or the nasal bridge  ?Neck:  ?   Thyroid: No thyromegaly.  ?   Vascular: No JVD.  ?Cardiovascular:  ?   Rate and Rhythm: Normal rate and regular rhythm.  ?   Heart sounds: Normal heart sounds. No murmur heard. ?  No friction rub. No gallop.  ?Pulmonary:  ?   Effort: Pulmonary effort is normal. No respiratory distress.  ?   Breath sounds: Normal breath sounds. No wheezing or rales.  ?Abdominal:  ?   General: Bowel sounds are normal. There is no distension.  ?   Palpations: Abdomen is soft.  There is no mass.  ?   Tenderness: There is no abdominal tenderness.  ?Musculoskeletal:     ?   General: No tenderness. Normal range of motion.  ?   Cervical back: Normal range of motion and neck supple.  ?   Right lower leg: No edema.  ?   Left lower leg: No edema.  ?Lymphadenopathy:  ?   Cervical: No cervical adenopathy.  ?Skin: ?   General: Skin is warm and dry.  ?   Findings: No erythema or rash.  ?Neurological:  ?   Mental Status: She is alert.  ?   Coordination: Coordination normal.  ?   Comments: The patient is awake and alert and able to follow commands, she is generally weak but not somnolent or obtunded  ?Psychiatric:     ?   Behavior: Behavior normal.  ? ? ?ED Results / Procedures / Treatments   ?Labs ?(all labs ordered are listed, but only abnormal results are displayed) ?Labs Reviewed  ?COMPREHENSIVE METABOLIC PANEL - Abnormal; Notable for the following components:  ?    Result Value  ? Sodium 130 (*)   ? CO2 20 (*)   ? Glucose, Bld 292 (*)   ? BUN 38 (*)   ? Creatinine, Ser 2.13 (*)   ? GFR, Estimated 23 (*)   ? All other components within normal limits  ?LACTIC ACID, PLASMA - Abnormal; Notable for the following components:  ? Lactic Acid, Venous 2.3 (*)   ? All other components within normal limits  ?CBC WITH DIFFERENTIAL/PLATELET - Abnormal; Notable for the following components:  ? WBC 19.0 (*)   ? RBC 3.80 (*)   ? Hemoglobin 10.7 (*)   ? HCT 33.7 (*)   ? Neutro Abs 16.6 (*)   ? Lymphs Abs 0.6 (*)   ? Monocytes Absolute 1.6 (*)   ? Abs Immature Granulocytes 0.13 (*)   ? All other components within normal limits  ?URINALYSIS, ROUTINE W REFLEX MICROSCOPIC - Abnormal; Notable for the following components:  ? APPearance CLOUDY (*)   ? Glucose, UA >=500 (*)   ? Hgb urine dipstick MODERATE (*)   ? Protein, ur 100 (*)   ? Leukocytes,Ua MODERATE (*)   ? WBC, UA >50 (*)   ? Bacteria, UA MANY (*)   ? All other components within normal limits  ?CBG MONITORING, ED - Abnormal; Notable for the following  components:  ?  Glucose-Capillary 289 (*)   ? All other components within normal limits  ?CULTURE, BLOOD (ROUTINE X 2)  ?CULTURE, BLOOD (ROUTINE X 2)  ?RESP PANEL BY RT-PCR (FLU A&B, COVID) ARPGX2  ?URINE CULTURE  ?PROTIME-INR  ?APTT  ?LACTIC ACID, PLASMA  ? ? ?EKG ?EKG Interpretation ? ?Date/Time:  Thursday February 22 2022 20:21:39 EDT ?Ventricular Rate:  84 ?PR Interval:  174 ?QRS Duration: 92 ?QT Interval:  347 ?QTC Calculation: 411 ?R Axis:   27 ?Text Interpretation: Sinus rhythm Baseline wander in lead(s) V5 Confirmed by Noemi Chapel [id number]) on 02/22/2022 8:39:33 PM ? ?Radiology ?DG Chest Portable 1 View ? ?Result Date: 02/22/2022 ?CLINICAL DATA:  Weakness.  Sepsis protocol. EXAM: PORTABLE CHEST 1 VIEW COMPARISON:  None. FINDINGS: Heart is upper limits of normal for size. This is exaggerated by low lung volumes. No edema or effusion is present. No focal airspace disease is present. The visualized soft tissues and bony thorax are unremarkable. IMPRESSION: 1. Low lung volumes. 2. No acute cardiopulmonary disease. Electronically Signed   By: San Morelle M.D.   On: 02/22/2022 20:45   ? ?Procedures ?Marland KitchenCritical Care ?Performed by: Noemi Chapel, MD ?Authorized by: Noemi Chapel, MD  ? ?Critical care provider statement:  ?  Critical care time (minutes):  30 ?  Critical care time was exclusive of:  Separately billable procedures and treating other patients and teaching time ?  Critical care was necessary to treat or prevent imminent or life-threatening deterioration of the following conditions:  Sepsis ?  Critical care was time spent personally by me on the following activities:  Development of treatment plan with patient or surrogate, discussions with consultants, evaluation of patient's response to treatment, examination of patient, ordering and review of laboratory studies, ordering and review of radiographic studies, ordering and performing treatments and interventions, pulse oximetry, re-evaluation of  patient's condition, review of old charts and obtaining history from patient or surrogate ?  I assumed direction of critical care for this patient from another provider in my specialty: no   ?  Care discussed with: admitting pr

## 2022-02-23 DIAGNOSIS — N179 Acute kidney failure, unspecified: Secondary | ICD-10-CM | POA: Diagnosis not present

## 2022-02-23 DIAGNOSIS — G9341 Metabolic encephalopathy: Secondary | ICD-10-CM

## 2022-02-23 DIAGNOSIS — A419 Sepsis, unspecified organism: Secondary | ICD-10-CM

## 2022-02-23 DIAGNOSIS — B9620 Unspecified Escherichia coli [E. coli] as the cause of diseases classified elsewhere: Secondary | ICD-10-CM | POA: Insufficient documentation

## 2022-02-23 DIAGNOSIS — R6520 Severe sepsis without septic shock: Secondary | ICD-10-CM

## 2022-02-23 DIAGNOSIS — I10 Essential (primary) hypertension: Secondary | ICD-10-CM | POA: Diagnosis not present

## 2022-02-23 DIAGNOSIS — Y92009 Unspecified place in unspecified non-institutional (private) residence as the place of occurrence of the external cause: Secondary | ICD-10-CM

## 2022-02-23 DIAGNOSIS — W19XXXA Unspecified fall, initial encounter: Secondary | ICD-10-CM

## 2022-02-23 LAB — BLOOD CULTURE ID PANEL (REFLEXED) - BCID2

## 2022-02-23 LAB — CBC WITH DIFFERENTIAL/PLATELET
Abs Immature Granulocytes: 0.07 10*3/uL (ref 0.00–0.07)
Basophils Absolute: 0 10*3/uL (ref 0.0–0.1)
Basophils Relative: 0 %
Eosinophils Absolute: 0 10*3/uL (ref 0.0–0.5)
Eosinophils Relative: 0 %
HCT: 30.7 % — ABNORMAL LOW (ref 36.0–46.0)
Hemoglobin: 9.7 g/dL — ABNORMAL LOW (ref 12.0–15.0)
Immature Granulocytes: 1 %
Lymphocytes Relative: 4 %
Lymphs Abs: 0.5 10*3/uL — ABNORMAL LOW (ref 0.7–4.0)
MCH: 28.4 pg (ref 26.0–34.0)
MCHC: 31.6 g/dL (ref 30.0–36.0)
MCV: 90 fL (ref 80.0–100.0)
Monocytes Absolute: 0.8 10*3/uL (ref 0.1–1.0)
Monocytes Relative: 6 %
Neutro Abs: 12.4 10*3/uL — ABNORMAL HIGH (ref 1.7–7.7)
Neutrophils Relative %: 89 %
Platelets: 314 10*3/uL (ref 150–400)
RBC: 3.41 MIL/uL — ABNORMAL LOW (ref 3.87–5.11)
RDW: 15 % (ref 11.5–15.5)
WBC: 13.8 10*3/uL — ABNORMAL HIGH (ref 4.0–10.5)
nRBC: 0 % (ref 0.0–0.2)

## 2022-02-23 LAB — COMPREHENSIVE METABOLIC PANEL
ALT: 12 U/L (ref 0–44)
AST: 19 U/L (ref 15–41)
Albumin: 2.9 g/dL — ABNORMAL LOW (ref 3.5–5.0)
Alkaline Phosphatase: 63 U/L (ref 38–126)
Anion gap: 8 (ref 5–15)
BUN: 34 mg/dL — ABNORMAL HIGH (ref 8–23)
CO2: 21 mmol/L — ABNORMAL LOW (ref 22–32)
Calcium: 9.2 mg/dL (ref 8.9–10.3)
Chloride: 105 mmol/L (ref 98–111)
Creatinine, Ser: 1.74 mg/dL — ABNORMAL HIGH (ref 0.44–1.00)
GFR, Estimated: 29 mL/min — ABNORMAL LOW (ref >60)
Glucose, Bld: 183 mg/dL — ABNORMAL HIGH (ref 70–99)
Potassium: 4.4 mmol/L (ref 3.5–5.1)
Sodium: 134 mmol/L — ABNORMAL LOW (ref 135–145)
Total Bilirubin: 0.6 mg/dL (ref 0.3–1.2)
Total Protein: 6.7 g/dL (ref 6.5–8.1)

## 2022-02-23 LAB — MAGNESIUM: Magnesium: 1.7 mg/dL (ref 1.7–2.4)

## 2022-02-23 LAB — GLUCOSE, CAPILLARY
Glucose-Capillary: 176 mg/dL — ABNORMAL HIGH (ref 70–99)
Glucose-Capillary: 176 mg/dL — ABNORMAL HIGH (ref 70–99)
Glucose-Capillary: 224 mg/dL — ABNORMAL HIGH (ref 70–99)
Glucose-Capillary: 235 mg/dL — ABNORMAL HIGH (ref 70–99)
Glucose-Capillary: 78 mg/dL (ref 70–99)

## 2022-02-23 LAB — CORTISOL-AM, BLOOD: Cortisol - AM: 31 ug/dL — ABNORMAL HIGH (ref 6.7–22.6)

## 2022-02-23 LAB — PROTIME-INR
INR: 1.2 (ref 0.8–1.2)
Prothrombin Time: 14.8 seconds (ref 11.4–15.2)

## 2022-02-23 LAB — RESP PANEL BY RT-PCR (FLU A&B, COVID) ARPGX2
Influenza A by PCR: NEGATIVE
Influenza B by PCR: NEGATIVE
SARS Coronavirus 2 by RT PCR: NEGATIVE

## 2022-02-23 MED ORDER — OXYCODONE HCL 5 MG PO TABS: 5.0000 mg | ORAL_TABLET | Freq: Four times a day (QID) | ORAL | Status: DC | PRN

## 2022-02-23 NOTE — H&P (Signed)
?History and Physical  ? ? ?Patient: Krista Payne:[id number] DOB: 1941/10/29 ?DOA: 02/22/2022 ?DOS: the patient was seen and examined on 02/23/2022 ?PCP: Celene Squibb, MD  ?Patient coming from: Home ? ?Chief Complaint:  ?Chief Complaint  ?Patient presents with  ? Fall  ? ?HPI: Krista Payne is a 81 y.o. female with medical history significant of breast cancer, CKD, diabetes mellitus type 2 uncontrolled, hyperlipidemia, hypertension, macular degeneration, and more presents to the ED with a chief complaint of fall.  She reports she has had 2 falls today.  She does not know what caused her falls.  She is not sure if she had generalized weakness, dizziness, trip and fall, chest pain, shortness of breath, or any other symptoms.  She reports she does know she fell and she had to have help to get up.  She is alert and oriented x3.  She reports decreased appetite but no nausea, vomiting, abdominal pain, diarrhea, constipation.  She reports that she is not in any pain.  She has no dysuria, hematuria, frequency, urgency.  She has not had any dyspnea or cough.  She has not had any fevers.  Patient has no other complaints at this time. ? ?Patient does not smoke, does not drink alcohol, does not use illicit drugs.  She reports she is not vaccinated for COVID.  Patient is full code. ?Review of Systems: As mentioned in the history of present illness. All other systems reviewed and are negative. ?Past Medical History:  ?Diagnosis Date  ? Breast cancer ([place])   ? R  ? Chronic kidney disease   ? Diabetes mellitus without complication ([place])   ? Hyperlipidemia   ? Hypertension   ? Macular degeneration   ? Osteoarthritis   ? ?Past Surgical History:  ?Procedure Laterality Date  ? CATARACT EXTRACTION W/PHACO Right 02/07/2015  ? Procedure: CATARACT EXTRACTION PHACO AND INTRAOCULAR LENS PLACEMENT (IOC);  Surgeon: Tonny Branch, MD;  Location: [facility];  Service: Ophthalmology;  Laterality: Right;  CDE:12.72  ? CATARACT EXTRACTION W/PHACO Left  02/24/2015  ? Procedure: CATARACT EXTRACTION PHACO AND INTRAOCULAR LENS PLACEMENT LEFT EYE;  Surgeon: Tonny Branch, MD;  Location: [facility];  Service: Ophthalmology;  Laterality: Left;  CDE 11.69  ? COLONOSCOPY    ? ?Social History:  reports that she has never smoked. She has never used smokeless tobacco. She reports that she does not drink alcohol and does not use drugs. ? ?No Known Allergies ? ?Family History  ?Problem Relation Age of Onset  ? Macular degeneration Mother   ? Diabetes Father   ? Heart disease Daughter   ? ? ?Prior to Admission medications   ?Medication Sig Start Date End Date Taking? Authorizing Provider  ?amLODipine (NORVASC) 5 MG tablet Take 5 mg by mouth daily.    [provider]  ?anastrozole (ARIMIDEX) 1 MG tablet Take 1 tablet (1 mg total) by mouth daily. 02/27/21   Derek Jack, MD  ?atenolol (TENORMIN) 25 MG tablet Take 25 mg by mouth daily.    [provider]  ?buPROPion (WELLBUTRIN XL) 150 MG 24 hr tablet Take 300 mg by mouth daily.    [provider]  ?furosemide (LASIX) 20 MG tablet Take 10 mg by mouth daily. 03/08/21   [provider]  ?insulin degludec (TRESIBA FLEXTOUCH) 100 UNIT/ML FlexTouch Pen Inject 15 Units into the skin at bedtime. 02/01/22   Brita Romp, NP  ?insulin glargine (LANTUS) 100 UNIT/ML injection Inject 0.15 mLs (15 Units total) into the  skin at bedtime. 02/06/22   Brita Romp, NP  ?Insulin Pen Needle (PEN NEEDLES) 31G X 6 MM MISC Use to inject insulin once daily 02/01/22   Brita Romp, NP  ?levocetirizine (XYZAL) 5 MG tablet Take 1 tablet (5 mg total) by mouth daily as needed for allergies. 11/15/21   Johnson, Clanford L, MD  ?olmesartan (BENICAR) 5 MG tablet Take 5 mg by mouth daily. 11/03/21   [provider]  ?simvastatin (ZOCOR) 20 MG tablet Take 1 tablet (20 mg total) by mouth at bedtime. 11/22/21   Murlean Iba, MD  ? ? ?Physical Exam: ?Vitals:  ? 02/22/22 2130 02/22/22 2200 02/22/22 2305 02/23/22  0006  ?BP: (!) 122/103 (!) 130/47 (!) 117/47 (!) 121/52  ?Pulse: 89 76 74 71  ?Resp: 16 20 (!) 23 18  ?Temp:   98.5 ?F (36.9 ?C) 98.1 ?F (36.7 ?C)  ?TempSrc:   Oral Oral  ?SpO2: 96% 92% 95% 98%  ?Weight:    91 kg  ?Height:    '5\' 2"'$  (1.575 m)  ? ?1.  General: ?Patient lying supine in bed,  no acute distress ?  ?2. Psychiatric: ?Alert and oriented x 3, mood and behavior normal for situation, pleasant and cooperative with exam ?  ?3. Neurologic: ?Speech and language are normal, face is symmetric, moves all 4 extremities voluntarily, at baseline without acute deficits on limited exam ?  ?4. HEENMT:  ?Head is atraumatic, normocephalic, pupils reactive to light, neck is supple, trachea is midline, mucous membranes are moist ?  ?5. Respiratory : ?Lungs are clear to auscultation bilaterally without wheezing, rhonchi, rales, no cyanosis, no increase in work of breathing or accessory muscle use ?  ?6. Cardiovascular : ?Heart rate normal, rhythm is regular, no murmurs, rubs or gallops, no peripheral edema, peripheral pulses palpated ?  ?7. Gastrointestinal:  ?Abdomen is soft, nondistended, nontender to palpation bowel sounds active, no masses or organomegaly palpated ?  ?8. Skin:  ?Skin is warm, dry and intact without rashes, acute lesions, or ulcers on limited exam ?  ?9.Musculoskeletal:  ?No acute deformities or trauma, no asymmetry in tone, no peripheral edema, peripheral pulses palpated, no tenderness to palpation in the extremities ? ?Data Reviewed: ?In the ED ?Temp 99.1-100.9, heart rate 76-89, respiratory rate 16-22, blood pressure 92/49-131/56, satting 94-96% ?Leukocytosis at 19, hemoglobin stable 10.7, platelets 394 ?Chemistry reveals a decreased bicarb at 20, increase in BUN and creatinine 38, 2.13 respectively ?Hyperglycemia 292, gap 11 ?Lactic acid 2.3 and then 1.0 ?Procalcitonin 6.32 ?CPK 826-patient has history of rhabdo he was not able to give much detail about her falls ?Blood culture and urine culture  pending ?Chest x-ray shows low lung volumes without acute cardiopulmonary disease ?EKG shows a heart rate of 84, sinus rhythm, QTc 411 ?Patient is given 1 L normal saline bolus followed by 150 mL/h of normal saline ?Tylenol was given for fever ?Rocephin started for UTI ?UA is indicative of UTI ?Assessment and Plan: ?* Sepsis ([place]) ?- Temp 100.9, respiratory rate 22, leukocytosis 19, AKI, acute metabolic encephalopathy, lactic acid 2.3 ?-Source is UTI ?-Blood culture urine culture pending ?-Rocephin started in ED, continue Rocephin ?-1 L bolus given in the ED followed by 150 mL/h normal saline, continue normal saline ?-Trend lactic acid ?-Continue Tylenol for fever ?-Continue to monitor ? ?Fall at home, initial encounter ?- CT head is without acute findings ?-No reported injury ?-PT eval and treat ? ?AKI (acute kidney injury) ([place]) ?- Creatinine at baseline 1.6, today 2.13 ?-  Secondary to sepsis ?-Continue IV fluid and ?-Trend in the a.m. ? ? ?Uncontrolled type 2 diabetes mellitus with hyperglycemia, without long-term current use of insulin ([place]) ?-Last hemoglobin A1c was April 20= 13.2 ?- Glucose in the ED 292 ?- Baseline is 15 units nightly ?-Carb modified diet, reduced to 8 units insulin nightly, sliding scale coverage ?-- Monitor CBGs ? ?Acute metabolic encephalopathy ?- Secondary to UTI and sepsis ?-CT head is without acute findings ?-Anticipate improvement with treatment of sepsis ?-Continue to monitor ? ?Lactic acidosis ?- Lactic acid 2.3, repeat at 2230 ?-Secondary to sepsis ?-BP currently stable 131/56, but had been as low as 92/49 ?-Continue IV fluids ?-Continue to monitor ? ?Essential hypertension ?- Holding Norvasc and Benicar in the setting of soft pressures ?-Continue atenolol ?-Continue to monitor ? ?Mixed hyperlipidemia ?- Continue statin ? ? ? ? ? Advance Care Planning:   Code Status: Full Code  ? ?Consults: None ? ?Family Communication: No family at bedside ? ?Severity of Illness: ?The appropriate  patient status for this patient is INPATIENT. Inpatient status is judged to be reasonable and necessary in order to provide the required intensity of service to ensure the patient's safety. The patient's prese

## 2022-02-23 NOTE — Progress Notes (Signed)
Nutrition Follow-up -MST Screen ? ?INTERVENTION:  ?Cho modified diet  ? ?Snack qhs as desired ? ?NUTRITION DIAGNOSIS:  ? ?Inadequate oral intake related to acute illness (UTI- poor appetite PTA) as evidenced by per patient/family report. ? ? ?GOAL:  ?Patient will meet greater than or equal to 90% of their needs ? ?MONITOR:  ?PO intake, Labs, Weight trends ? ?REASON FOR ASSESSMENT:  ? ?Malnutrition Screening Tool ?  ? ?ASSESSMENT: Patient is a 81 yo female presents after multiple falls. Complains of generalized weakness. Lives alone. Reported poor intake days prior to admission.  ? ?PMH: DM2, CKD, HTN, HLD, Breast cancer and macular degeneration. ? ?[employer]. Patient says she is eating much better today. Reflected in documented intake 75% breakfast and 100% of lunch completed. Able to feed herself.  ? ?Weights:patient reports usual weight of 195 lb. Currently 91 kg (200 lb).  ? ?Medications: insulin ? ?IVF- NS'@100'$  ml/hr ? ?Labs: ? ?  Latest Ref Rng & Units 02/23/2022  ?  5:26 AM 02/22/2022  ?  8:21 PM 01/05/2022  ? 12:00 AM  ?BMP  ?Glucose 70 - 99 mg/dL 183   292     ?BUN 8 - 23 mg/dL 34   38   41       ?Creatinine 0.44 - 1.00 mg/dL 1.74   2.13   2.3       ?Sodium 135 - 145 mmol/L 134   130     ?Potassium 3.5 - 5.1 mmol/L 4.4   4.9   5.6       ?Chloride 98 - 111 mmol/L 105   99     ?CO2 22 - 32 mmol/L 21   20     ?Calcium 8.9 - 10.3 mg/dL 9.2   9.9     ?  ? This result is from an external source.  ?   ? ?NUTRITION - FOCUSED PHYSICAL EXAM: ?Nutrition-Focused physical exam completed. Findings are no fat depletion, mild muscle depletion, and no edema.   ? ? ?Diet Order:   ?Diet Order   ? ?       ?  Diet Carb Modified Fluid consistency: Thin; Room service appropriate? Yes  Diet effective now       ?  ? ?  ?  ? ?  ? ? ?EDUCATION NEEDS:  ?Education needs have been addressed ? ?Skin:  Skin Assessment: Reviewed RN Assessment ? ?Last BM:  4/28 ? ?Height:  ? ?Ht Readings from Last 1 Encounters:  ?02/23/22 '5\' 2"'$   (1.575 m)  ? ? ?Weight:  ? ?Wt Readings from Last 1 Encounters:  ?02/23/22 91 kg  ? ? ?Ideal Body Weight:   50 kg ? ?BMI:  Body mass index is 36.69 kg/m?. ? ?Estimated Nutritional Needs:  ? ?Kcal:  1650-1800 ? ?Protein:  80-85 gr ? ?Fluid:  1.7-1.8 liters daily ? ?Colman Cater MS,RD,CSG,LDN ?Contact: [facility]  ?

## 2022-02-23 NOTE — Progress Notes (Signed)
?  Transition of Care (TOC) Screening Note ? ? ?Patient Details  ?Name: Krista Payne ?Date of Birth: 04-14-1941 ? ? ?Transition of Care (TOC) CM/SW Contact:    ?Acel Natzke D, LCSW ?Phone Number: ?02/23/2022, 1:48 PM ? ? ? ?Transition of Care Department [facility]) has reviewed patient and no TOC needs have been identified at this time. We will continue to monitor patient advancement through interdisciplinary progression rounds. If new patient transition needs arise, please place a TOC consult. ?  ?

## 2022-02-23 NOTE — Progress Notes (Signed)
PHARMACY - PHYSICIAN COMMUNICATION ?CRITICAL VALUE ALERT - BLOOD CULTURE IDENTIFICATION (BCID) ? ?Krista Payne is an 81 y.o. female who presented to [facility] on 02/22/2022 with a chief complaint of fall ? ?Assessment:  acute UTI with acute kidney injury and meeting sepsis criteria . BCID + E.Coli and no resistance. + BCX 2 of 4 bottles with E. Coli ? ?Name of physician (or Provider) Contacted: Dr. Dyann Kief ? ?Current antibiotics: Ceftriaxone2gm IV q24h ? ?Changes to prescribed antibiotics recommended:  ?Patient is on recommended antibiotics - No changes needed ? ?No results found for this or any previous visit. ? ?Cristy Friedlander ?02/23/2022  5:32 PM ? ?

## 2022-02-23 NOTE — Progress Notes (Signed)
Date and time results received: 02/23/22 1340 ?(use smartphrase ".now" to insert current time) ? ?Test: blood culture  ?Critical Value: positive anaerobic gram negative rods ? ?Name of Provider Notified: Dr. Dyann Kief ? ?Orders Received? Or Actions Taken?:  ?

## 2022-02-23 NOTE — Progress Notes (Signed)
Patient seen and examined.  Admitted after midnight secondary to unwitnessed mechanical fall, generalized weakness, decreased appetite and general malaise.  Work-up has demonstrated acute UTI with acute kidney injury and meeting sepsis criteria on admission.  Cultures has been taken, IV fluid resuscitation provided and antibiotics started.  Please refer to H&P written by Dr. Clearence Ped for further info/details on admission.  ? ?Plan: ?-Continue minimizing/avoiding nephrotoxic agents and contrast ?-Continue fluid resuscitation ?-Continue current antibiotic ?-Follow culture results ?-Follow clinical response ?-Continue providing supportive care. ?-Blood pressure is currently stable and patient presenting just low-grade temperature. ? ? ?Barton Dubois MD ?[phone] ? ?

## 2022-02-24 DIAGNOSIS — I10 Essential (primary) hypertension: Secondary | ICD-10-CM | POA: Diagnosis not present

## 2022-02-24 DIAGNOSIS — E782 Mixed hyperlipidemia: Secondary | ICD-10-CM

## 2022-02-24 DIAGNOSIS — E872 Acidosis, unspecified: Secondary | ICD-10-CM

## 2022-02-24 DIAGNOSIS — N390 Urinary tract infection, site not specified: Secondary | ICD-10-CM

## 2022-02-24 DIAGNOSIS — N179 Acute kidney failure, unspecified: Secondary | ICD-10-CM | POA: Diagnosis not present

## 2022-02-24 DIAGNOSIS — A419 Sepsis, unspecified organism: Secondary | ICD-10-CM | POA: Diagnosis not present

## 2022-02-24 DIAGNOSIS — E1165 Type 2 diabetes mellitus with hyperglycemia: Secondary | ICD-10-CM

## 2022-02-24 DIAGNOSIS — G9341 Metabolic encephalopathy: Secondary | ICD-10-CM | POA: Diagnosis not present

## 2022-02-24 LAB — BASIC METABOLIC PANEL
Anion gap: 7 (ref 5–15)
BUN: 32 mg/dL — ABNORMAL HIGH (ref 8–23)
CO2: 21 mmol/L — ABNORMAL LOW (ref 22–32)
Calcium: 8.3 mg/dL — ABNORMAL LOW (ref 8.9–10.3)
Chloride: 106 mmol/L (ref 98–111)
Creatinine, Ser: 1.62 mg/dL — ABNORMAL HIGH (ref 0.44–1.00)
GFR, Estimated: 32 mL/min — ABNORMAL LOW (ref >60)
Glucose, Bld: 153 mg/dL — ABNORMAL HIGH (ref 70–99)
Potassium: 3.7 mmol/L (ref 3.5–5.1)
Sodium: 134 mmol/L — ABNORMAL LOW (ref 135–145)

## 2022-02-24 LAB — GLUCOSE, CAPILLARY
Glucose-Capillary: 141 mg/dL — ABNORMAL HIGH (ref 70–99)
Glucose-Capillary: 197 mg/dL — ABNORMAL HIGH (ref 70–99)
Glucose-Capillary: 190 mg/dL — ABNORMAL HIGH (ref 70–99)
Glucose-Capillary: 211 mg/dL — ABNORMAL HIGH (ref 70–99)
Glucose-Capillary: 213 mg/dL — ABNORMAL HIGH (ref 70–99)

## 2022-02-24 LAB — CBC
HCT: 29.3 % — ABNORMAL LOW (ref 36.0–46.0)
Hemoglobin: 8.8 g/dL — ABNORMAL LOW (ref 12.0–15.0)
MCH: 27.1 pg (ref 26.0–34.0)
MCHC: 30 g/dL (ref 30.0–36.0)
MCV: 90.2 fL (ref 80.0–100.0)
Platelets: 295 10*3/uL (ref 150–400)
RBC: 3.25 MIL/uL — ABNORMAL LOW (ref 3.87–5.11)
RDW: 15 % (ref 11.5–15.5)
WBC: 11.6 10*3/uL — ABNORMAL HIGH (ref 4.0–10.5)
nRBC: 0 % (ref 0.0–0.2)

## 2022-02-24 NOTE — Progress Notes (Signed)
?Progress Note ? ? ?Patient: Krista Payne IFO:[id number] DOB: 1941-05-10 DOA: 02/22/2022     2 ?DOS: the patient was seen and examined on 02/24/2022 ?  ?Brief hospital course: ?As per H&P written by Dr.Zierle-Ghosh on 02/23/22 ?Krista Payne is a 81 y.o. female with medical history significant of breast cancer, CKD, diabetes mellitus type 2 uncontrolled, hyperlipidemia, hypertension, macular degeneration, and more presents to the ED with a chief complaint of fall.  She reports she has had 2 falls today.  She does not know what caused her falls.  She is not sure if she had generalized weakness, dizziness, trip and fall, chest pain, shortness of breath, or any other symptoms.  She reports she does know she fell and she had to have help to get up.  She is alert and oriented x3.  She reports decreased appetite but no nausea, vomiting, abdominal pain, diarrhea, constipation.  She reports that she is not in any pain.  She has no dysuria, hematuria, frequency, urgency.  She has not had any dyspnea or cough.  She has not had any fevers.  Patient has no other complaints at this time. ?  ?Patient does not smoke, does not drink alcohol, does not use illicit drugs.  She reports she is not vaccinated for COVID.  Patient is full code. ? ?Assessment and Plan: ?* Sepsis ([place]) ?- Sepsis features adequately improving with current therapy. ?-Continue IV fluids and IV antibiotics ?-Follow final culture results/sensitivity ?-Continue supportive care and as needed antipyretics.   ?-Patient met sepsis criteria at time of admission.  Appear to be secondary to UTI and gram-negative rods bacteremia. ? ? ?Fall at home, initial encounter ?-CT head is without acute findings ?-No reported injury ?-PT eval and treat has recommended home health PT. ? ?AKI (acute kidney injury) ([place]) ?-Creatinine 2.13 at time of admission ?-Secondary to sepsis, prerenal azotemia and continue use of nephrotoxic agents. ?-Continue to minimize the use of nephrotoxic  agents and contrast ?-Continue to maintain adequate hydration ?-Continue to follow renal function trend and treat UTI. ?-Creatinine 1.6 currently (essentially back to baseline). ? ? ?Uncontrolled type 2 diabetes mellitus with hyperglycemia, without long-term current use of insulin ([place]) ?-Last hemoglobin A1c 13.2 ?-Modified carbohydrate diet has been discussed with patient ?-Continue to hold oral hypoglycemic agents while inpatient ?-Continue sliding scale insulin and long acting insulin. ?-Follow CBGs and adjust hypoglycemia regimen as needed. ? ? ?Acute metabolic encephalopathy ?- Secondary to UTI and sepsis ?-Years resolved currently ?-Continue constant reorientation and treatment of acute infection. ? ?Lactic acidosis ?-Secondary to sepsis ?-Repeat lactic acid improved/normalized after fluid resuscitation ?-Continue to maintain adequate hydration ?-Follow vital signs and avoid hypotension ?-Continue current treatment of sepsis/UTI with IV antibiotics. ? ? ?Essential hypertension ?-Blood pressure remains soft and is stable ?-Continue holding Benicar and amlodipine ?-Continue the use of atenolol ?-Heart healthy diet discussed with patient ?-Maintain adequate hydration. ? ? ?Mixed hyperlipidemia ?-Continue statin ?-Heart healthy diet discussed with patient. ? ?Class II obesity ?-Body mass index is 36.69 kg/m?. ?-Low calorie diet and portion control discussed with patient. ? ?Stage I pressure injury in her sacrum ?-Present at time of admission ?-No signs of superimposed infection ?-Continue constant repositioning and preventive measures. ? ?Subjective:  ?Low-grade temperature overnight; denies chest pain, no nausea or vomiting.  Expressed no dysuria.  Feeling weak and tired. ? ?Physical Exam: ?Vitals:  ? 02/23/22 2058 02/24/22 0541 02/24/22 0811 02/24/22 1322  ?BP: (!) 157/66 (!) 163/68 (!) 107/50 (!) 139/50  ?Pulse: 80  70 68 70  ?Resp: _0 ?Temp: 99.3 ?F (37.4 ?C) 98.5 ?F (36.9 ?C) 98.5 ?F (36.9 ?C) 98.6  ?F (37 ?C)  ?TempSrc: Oral Oral Oral Oral  ?SpO2: 100% 97% 96% 97%  ?Weight:      ?Height:      ? ?General exam: Alert, awake, oriented x 3; reporting no nausea, no vomiting, no dysuria.  Patient expressed frequency and has low-grade temperature overnight. ?Respiratory system: Clear to auscultation. Respiratory effort normal.  Good saturation on room air. ?Cardiovascular system:RRR. No rubs or gallops; unable to assess JVD with body habitus. ?Gastrointestinal system: Abdomen is obese, nondistended, soft and nontender. No organomegaly or masses felt. Normal bowel sounds heard. ?Central nervous system: Alert and oriented. No focal neurological deficits. ?Extremities: No cyanosis or clubbing. ?Skin: No petechiae; stage I sacral and pressure injury present at time of admission without signs of superimposed infection. ?Psychiatry: Judgement and insight appear normal. Mood & affect appropriate.  ? ?Data Reviewed: ?CBC demonstrating WBCs 11.6, hemoglobin 8.8, platelet count 295 K ?Basic metabolic panel with a sodium of 134, potassium 3.7, bicarb 21, BUN 32, creatinine 1.62 ?CBGs in the 140s to 170s ?-Positive blood cultures for gram-negative rods. ? ?Family Communication: Daughter updated over the phone. ? ?Disposition: ?Status is: Inpatient ?Remains inpatient appropriate because: Continue treatment of IV antibiotics for gram-negative rods UTI and bacteremia; patient is still with low-grade temperature and pending final culture results for sensitivity. ? ? Planned Discharge Destination: Home with Home Health ? ?Author: ?Barton Dubois, MD ?02/24/2022 1:33 PM ? ?For on call review [email or web address].  ?

## 2022-02-24 NOTE — Evaluation (Signed)
Physical Therapy Evaluation ?Patient Details ?Name: Krista Payne ?MRN: [id number] ?DOB: [birth year] ?Today's Date: 02/24/2022 ? ?History of Present Illness ? Krista Payne is a 81 y.o. female with medical history significant of breast cancer, CKD, diabetes mellitus type 2 uncontrolled, hyperlipidemia, hypertension, macular degeneration, and more presents to the ED with a chief complaint of fall.  She reports she has had 2 falls today.  She does not know what caused her falls.  She is not sure if she had generalized weakness, dizziness, trip and fall, chest pain, shortness of breath, or any other symptoms.  She reports she does know she fell and she had to have help to get up.  She is alert and oriented x3.  She reports decreased appetite but no nausea, vomiting, abdominal pain, diarrhea, constipation.  She reports that she is not in any pain.  She has no dysuria, hematuria, frequency, urgency.  She has not had any dyspnea or cough.  She has not had any fevers.  Patient has no other complaints at this time. ? ?  ?Clinical Impression ? On therapist arrival, patient in sitting up in bed.  She is pleasant and agreeable to therapist evaluation.  Patient comes to EOB with extra time. Able to sit at EOB with therapist SBA with both feet on the floor.  Stand and step transfer from bed to [facility] with RW and CGA with no LOB.  She is able to walk in the room with RW and therapist CGA safely.  She tends to forward flex at the trunk and noted decreased overall gait speed.  Patient will benefit from continued skilled therapy services during the duration of her hospital stay and at the next recommended venue of care to address deficits and promote optimal function.    ?   ? ?Recommendations for follow up therapy are one component of a multi-disciplinary discharge planning process, led by the attending physician.  Recommendations may be updated based on patient status, additional functional criteria and insurance  authorization. ? ?Follow Up Recommendations Home health PT ? ?  ?Assistance Recommended at Discharge Intermittent Supervision/Assistance  ?Patient can return home with the following ? A little help with walking and/or transfers;A little help with bathing/dressing/bathroom;Help with stairs or ramp for entrance ? ?  ?Equipment Recommendations None recommended by PT  ?Recommendations for Other Services ?    ?  ?Functional Status Assessment Patient has had a recent decline in their functional status and demonstrates the ability to make significant improvements in function in a reasonable and predictable amount of time.  ? ?  ?Precautions / Restrictions Precautions ?Precautions: Fall ?Precaution Comments: history of 2 recent falls at home ?Restrictions ?Weight Bearing Restrictions: No  ? ?  ? ?Mobility ? Bed Mobility ?Overal bed mobility: Modified Independent ?  ?  ?  ?  ?  ?  ?General bed mobility comments: takes extra time to come to EOB ?Patient Response: Cooperative ? ?Transfers ?Overall transfer level: Needs assistance ?Equipment used: Rolling walker (2 wheels) ?Transfers: Sit to/from Stand, Bed to chair/wheelchair/BSC ?Sit to Stand: Min guard ?  ?Step pivot transfers: Min guard ?  ?  ?  ?  ?  ? ?Ambulation/Gait ?Ambulation/Gait assistance: Min guard ?Gait Distance (Feet): 30 Feet ?Assistive device: Rolling walker (2 wheels) ?Gait Pattern/deviations: Decreased step length - left, Decreased step length - right ?  ?  ?  ?General Gait Details: forward flexed trunk; no loss of balance noted ? ?Stairs ?  ?  ?  ?  ?  ? ?  Wheelchair Mobility ?  ? ?Modified Rankin (Stroke Patients Only) ?  ? ?  ? ?Balance Overall balance assessment: Needs assistance ?Sitting-balance support: Feet supported ?Sitting balance-Leahy Scale: Good ?Sitting balance - Comments: good sitting balance on EOB with feet on the floor ?  ?Standing balance support: Bilateral upper extremity supported, During functional activity, Reliant on assistive device  for balance ?Standing balance-Leahy Scale: Good ?Standing balance comment: good standing balance with RW and CGA from therapist ?  ?  ?  ?  ?  ?  ?  ?  ?  ?  ?  ?   ? ? ? ?Pertinent Vitals/Pain Pain Assessment ?Pain Assessment: 0-10 ?Pain Score: 0-No pain  ? ? ?Home Living Family/patient expects to be discharged to:: Private residence ?Living Arrangements: Alone ?Available Help at Discharge: Family;Friend(s);Available PRN/intermittently ?Type of Home: House ?Home Access: Stairs to enter ?Entrance Stairs-Rails: None ?Entrance Stairs-Number of Steps: 2-2 ?  ?Home Layout: One level ?Home Equipment: Conservation officer, nature (2 wheels);Rollator (4 wheels);Cane - quad;Shower seat;Grab bars - tub/shower ?   ?  ?Prior Function Prior Level of Function : Independent/Modified Independent ?  ?  ?  ?  ?  ?  ?Mobility Comments: Patient states she uses a cane at home sometimes ?ADLs Comments: Pt reports independence with ADL's and IADL's. ?  ? ? ?Hand Dominance  ? Dominant Hand: Right ? ?  ?Extremity/Trunk Assessment  ? Upper Extremity Assessment ?Upper Extremity Assessment: Generalized weakness ?  ? ?Lower Extremity Assessment ?Lower Extremity Assessment: Generalized weakness ?  ? ?Cervical / Trunk Assessment ?Cervical / Trunk Assessment: Normal  ?Communication  ? Communication: No difficulties  ?Cognition Arousal/Alertness: Awake/alert ?  ?Overall Cognitive Status: Within Functional Limits for tasks assessed ?  ?  ?  ?  ?  ?  ?  ?  ?  ?  ?  ?  ?  ?  ?  ?  ?  ?  ?  ? ?  ?General Comments   ? ?  ?Exercises    ? ?Assessment/Plan  ?  ?PT Assessment Patient needs continued PT services  ?PT Problem List Decreased strength;Decreased activity tolerance;Decreased balance;Decreased mobility ? ?   ?  ?PT Treatment Interventions Gait training;DME instruction;Balance training;Neuromuscular re-education;Functional mobility training;Patient/family education;Therapeutic activities;Therapeutic exercise   ? ?PT Goals (Current goals can be found in the Care  Plan section)  ?Acute Rehab PT Goals ?Patient Stated Goal: return home ?PT Goal Formulation: With patient ?Time For Goal Achievement: 03/09/22 ?Potential to Achieve Goals: Good ? ?  ?Frequency Min 2X/week ?  ? ? ?Co-evaluation   ?  ?  ?  ?  ? ? ?  ?AM-PAC PT "6 Clicks" Mobility  ?Outcome Measure Help needed turning from your back to your side while in a flat bed without using bedrails?: A Little ?Help needed moving from lying on your back to sitting on the side of a flat bed without using bedrails?: A Little ?Help needed moving to and from a bed to a chair (including a wheelchair)?: A Little ?Help needed standing up from a chair using your arms (e.g., wheelchair or bedside chair)?: A Little ?Help needed to walk in hospital room?: A Little ?Help needed climbing 3-5 steps with a railing? : A Lot ?6 Click Score: 17 ? ?  ?End of Session   ?Activity Tolerance: Patient tolerated treatment well ?Patient left: in bed;with call bell/phone within reach;with bed alarm set ?Nurse Communication: Mobility status ?PT Visit Diagnosis: Unsteadiness on feet (R26.81);Other abnormalities of gait and mobility (  R26.89);Repeated falls (R29.6);Muscle weakness (generalized) (M62.81) ?  ? ?Time: 4854-6270 ?PT Time Calculation (min) (ACUTE ONLY): 16 min ? ? ?Charges:   PT Evaluation ?$PT Eval Low Complexity: 1 Low ?PT Treatments ?$Gait Training: 8-22 mins ?  ?   ? ? ?9:45 AM, 02/24/22 ?Dorse Locy Small Shakti Fleer MPT ?[facility] ?[place] [phone] ?[phone] ? ?

## 2022-02-24 NOTE — Plan of Care (Signed)
?  Problem: Acute Rehab PT Goals(only PT should resolve) ?Goal: Pt Will Go Supine/Side To Sit ?Outcome: Progressing ?Flowsheets (Taken 02/24/2022 0946) ?Pt will go Supine/Side to Sit: with modified independence ?Goal: Patient Will Transfer Sit To/From Stand ?Outcome: Progressing ?Flowsheets (Taken 02/24/2022 0946) ?Patient will transfer sit to/from stand: with supervision ?Goal: Pt Will Transfer Bed To Chair/Chair To Bed ?Outcome: Progressing ?Flowsheets (Taken 02/24/2022 0946) ?Pt will Transfer Bed to Chair/Chair to Bed: with supervision ?Goal: Pt Will Ambulate ?Outcome: Progressing ?Flowsheets (Taken 02/24/2022 0946) ?Pt will Ambulate: ? 100 feet ? with supervision ? with rolling walker ?  ?

## 2022-02-25 DIAGNOSIS — G9341 Metabolic encephalopathy: Secondary | ICD-10-CM | POA: Diagnosis not present

## 2022-02-25 DIAGNOSIS — A419 Sepsis, unspecified organism: Secondary | ICD-10-CM | POA: Diagnosis not present

## 2022-02-25 DIAGNOSIS — N179 Acute kidney failure, unspecified: Secondary | ICD-10-CM | POA: Diagnosis not present

## 2022-02-25 DIAGNOSIS — I10 Essential (primary) hypertension: Secondary | ICD-10-CM | POA: Diagnosis not present

## 2022-02-25 LAB — GLUCOSE, CAPILLARY
Glucose-Capillary: 138 mg/dL — ABNORMAL HIGH (ref 70–99)
Glucose-Capillary: 211 mg/dL — ABNORMAL HIGH (ref 70–99)
Glucose-Capillary: 173 mg/dL — ABNORMAL HIGH (ref 70–99)
Glucose-Capillary: 195 mg/dL — ABNORMAL HIGH (ref 70–99)
Glucose-Capillary: 232 mg/dL — ABNORMAL HIGH (ref 70–99)

## 2022-02-25 LAB — URINE CULTURE: Culture: >=100000 — AB

## 2022-02-25 NOTE — Progress Notes (Signed)
?Progress Note ? ? ?Patient: Krista Payne:[id number] DOB: 01-03-1941 DOA: 02/22/2022     3 ?DOS: the patient was seen and examined on 02/25/2022 ?  ?Brief hospital course: ?As per H&P written by Dr.Zierle-Ghosh on 02/23/22 ?Krista Payne is a 81 y.o. female with medical history significant of breast cancer, CKD, diabetes mellitus type 2 uncontrolled, hyperlipidemia, hypertension, macular degeneration, and more presents to the ED with a chief complaint of fall.  She reports she has had 2 falls today.  She does not know what caused her falls.  She is not sure if she had generalized weakness, dizziness, trip and fall, chest pain, shortness of breath, or any other symptoms.  She reports she does know she fell and she had to have help to get up.  She is alert and oriented x3.  She reports decreased appetite but no nausea, vomiting, abdominal pain, diarrhea, constipation.  She reports that she is not in any pain.  She has no dysuria, hematuria, frequency, urgency.  She has not had any dyspnea or cough.  She has not had any fevers.  Patient has no other complaints at this time. ?  ?Patient does not smoke, does not drink alcohol, does not use illicit drugs.  She reports she is not vaccinated for COVID.  Patient is full code. ? ?Assessment and Plan: ?* Sepsis ([place]) ?- Sepsis features adequately improving with current therapy. ?-Continue IV fluids and IV antibiotics ?-Follow final culture results/sensitivity ?-Continue supportive care and as needed antipyretics.   ?-Patient met sepsis criteria at time of admission.  Appear to be secondary to E. coli UTI and bacteremia. ?-Microorganism resistant to Bactrim and ampicillin. ? ? ?Fall at home, initial encounter ?-CT head is without acute findings ?-No reported injury ?-PT eval and treat has recommended home health PT. ? ?AKI (acute kidney injury) ([place]) ?-Creatinine 2.13 at time of admission ?-Secondary to sepsis, prerenal azotemia and continue use of nephrotoxic  agents. ?-Continue to minimize the use of nephrotoxic agents and contrast ?-Continue to maintain adequate hydration ?-Continue to follow renal function trend and treat UTI. ?-Creatinine 1.6 currently (essentially back to baseline). ? ? ?Uncontrolled type 2 diabetes mellitus with hyperglycemia, without long-term current use of insulin ([place]) ?-Last hemoglobin A1c 13.2 ?-Modified carbohydrate diet has been discussed with patient ?-Continue to hold oral hypoglycemic agents while inpatient ?-Continue sliding scale insulin and long acting insulin. ?-Follow CBGs and adjust hypoglycemia regimen as needed. ? ? ?Acute metabolic encephalopathy ?-In the setting of UTI/sepsis. ?-Continue constant reorientation, treatment of infection and supportive care. ?-Mentation back to baseline at this time. ? ?Lactic acidosis ?-Secondary to sepsis ?-Repeat lactic acid improved/normalized after fluid resuscitation ?-Continue to maintain adequate hydration ?-Follow vital signs and avoid hypotension ?-Continue current treatment of sepsis/UTI with IV antibiotics. ? ? ?Essential hypertension ?-Blood pressure remains soft and is stable ?-Continue holding Benicar and amlodipine ?-Continue the use of atenolol ?-Heart healthy diet discussed with patient ?-Maintain adequate hydration. ? ? ?Mixed hyperlipidemia ?-Continue statin ?-Heart healthy diet discussed with patient. ? ?Class II obesity ?-Body mass index is 36.69 kg/m?. ?-Low calorie diet and portion control discussed with patient. ? ?Stage I pressure injury in her sacrum ?-Present at time of admission ?-No signs of superimposed infection ?-Continue constant repositioning and preventive measures. ? ?Subjective:  ?Tmax 99.3; no chest pain, no nausea, no vomiting.  Responding appropriately to IV antibiotics.  Anticipate IV therapy for another 24 hours and subsequent transition to oral route and discharged home with home health services. ? ?  Physical Exam: ?Vitals:  ? 02/24/22 0811 02/24/22 1322  02/24/22 2057 02/25/22 0543  ?BP: (!) 107/50 (!) 139/50 (!) 131/45 139/69  ?Pulse: 68 70 67 (!) 59  ?Resp: _0 ?Temp: 98.5 ?F (36.9 ?C) 98.6 ?F (37 ?C) 99.2 ?F (37.3 ?C) 98 ?F (36.7 ?C)  ?TempSrc: Oral Oral Oral Oral  ?SpO2: 96% 97% 97% 99%  ?Weight:      ?Height:      ? ?General exam: Alert, awake, oriented x 3; reports feeling better; no nausea, no vomiting, no chest pain.  Good saturation on room air.  Tmax 99.3. ?Respiratory system: Clear to auscultation. Respiratory effort normal.  No using accessory muscles. ?Cardiovascular system:RRR. No rubs or gallops; no JVD. ?Gastrointestinal system: Abdomen is obese, nondistended, soft and nontender. No organomegaly or masses felt. Normal bowel sounds heard. ?Central nervous system: Alert and oriented. No focal neurological deficits. ?Extremities: No cyanosis or clubbing. ?Skin: No petechiae.  Stage I sacral pressure injury present at time of admission without signs of superimposed infection. ?Psychiatry: Judgement and insight appear normal. Mood & affect appropriate.  ? ?Data Reviewed: ?-E. coli bacteremia and UTI resistant to ampicillin and Bactrim ? ? ?Family Communication: Daughter updated over the phone. ? ?Disposition: ?Status is: Inpatient ?Remains inpatient appropriate because: Continue treatment of IV antibiotics for gram-negative rods UTI and bacteremia; patient is still with low-grade temperature and pending final culture results for sensitivity. ? ? Planned Discharge Destination: Home with Home Health ? ?Author: ?Barton Dubois, MD ?02/25/2022 9:22 AM ? ?For on call review [email or web address]  ?

## 2022-02-26 ENCOUNTER — Other Ambulatory Visit: Payer: Self-pay

## 2022-02-26 DIAGNOSIS — A419 Sepsis, unspecified organism: Secondary | ICD-10-CM | POA: Diagnosis not present

## 2022-02-26 DIAGNOSIS — N179 Acute kidney failure, unspecified: Secondary | ICD-10-CM | POA: Diagnosis not present

## 2022-02-26 DIAGNOSIS — A4151 Sepsis due to Escherichia coli [E. coli]: Secondary | ICD-10-CM

## 2022-02-26 DIAGNOSIS — G9341 Metabolic encephalopathy: Secondary | ICD-10-CM | POA: Diagnosis not present

## 2022-02-26 DIAGNOSIS — N1832 Chronic kidney disease, stage 3b: Secondary | ICD-10-CM

## 2022-02-26 DIAGNOSIS — E1121 Type 2 diabetes mellitus with diabetic nephropathy: Secondary | ICD-10-CM

## 2022-02-26 LAB — BASIC METABOLIC PANEL
Anion gap: 5 (ref 5–15)
BUN: 22 mg/dL (ref 8–23)
CO2: 24 mmol/L (ref 22–32)
Calcium: 8.3 mg/dL — ABNORMAL LOW (ref 8.9–10.3)
Chloride: 107 mmol/L (ref 98–111)
Creatinine, Ser: 1.31 mg/dL — ABNORMAL HIGH (ref 0.44–1.00)
GFR, Estimated: 41 mL/min — ABNORMAL LOW (ref >60)
Glucose, Bld: 122 mg/dL — ABNORMAL HIGH (ref 70–99)
Potassium: 4 mmol/L (ref 3.5–5.1)
Sodium: 136 mmol/L (ref 135–145)

## 2022-02-26 LAB — CBC
HCT: 29.1 % — ABNORMAL LOW (ref 36.0–46.0)
Hemoglobin: 9 g/dL — ABNORMAL LOW (ref 12.0–15.0)
MCH: 27.9 pg (ref 26.0–34.0)
MCHC: 30.9 g/dL (ref 30.0–36.0)
MCV: 90.1 fL (ref 80.0–100.0)
Platelets: 364 10*3/uL (ref 150–400)
RBC: 3.23 MIL/uL — ABNORMAL LOW (ref 3.87–5.11)
RDW: 14.9 % (ref 11.5–15.5)
WBC: 7.1 10*3/uL (ref 4.0–10.5)
nRBC: 0 % (ref 0.0–0.2)

## 2022-02-26 LAB — CULTURE, BLOOD (ROUTINE X 2)

## 2022-02-26 LAB — GLUCOSE, CAPILLARY
Glucose-Capillary: 132 mg/dL — ABNORMAL HIGH (ref 70–99)
Glucose-Capillary: 201 mg/dL — ABNORMAL HIGH (ref 70–99)

## 2022-02-26 MED ORDER — AMLODIPINE BESYLATE 5 MG PO TABS: 2.5000 mg | ORAL_TABLET | Freq: Every day | ORAL | Status: AC

## 2022-02-26 MED ORDER — ZINC OXIDE 20 % EX OINT: 1.0000 "application " | TOPICAL_OINTMENT | Freq: Three times a day (TID) | CUTANEOUS | 0 refills | Status: DC | PRN

## 2022-02-26 MED ORDER — CEFUROXIME AXETIL 500 MG PO TABS: 500.0000 mg | ORAL_TABLET | Freq: Two times a day (BID) | ORAL | 0 refills | Status: AC

## 2022-02-26 NOTE — Assessment & Plan Note (Signed)
-  Secondary to E. coli infection ?-Advised to maintain adequate hydration ?-Complete antibiotic therapy as instructed. ?

## 2022-02-26 NOTE — Discharge Summary (Signed)
Physician Discharge Summary   Patient: Krista Payne MRN: [id number] DOB: [birth year]  Admit date:     02/22/2022  Discharge date: 02/26/22  Discharge Physician: Vassie Loll   PCP: Krista Stabile, MD   Recommendations at discharge:  Reassess blood pressure and adjust antihypertensive regimen as needed Repeat basic metabolic panel to follow electrolytes and renal function Assure complete resolution of patient's UTI after completion of antibiotics. Continue close monitoring of patient's CBGs with further adjustment to hypoglycemia regimen as needed.  Discharge Diagnoses: Principal Problem:   Sepsis (HCC) Active Problems:   Mixed hyperlipidemia   Essential hypertension   Urinary tract infection without hematuria   Lactic acidosis   Acute metabolic encephalopathy   Chronic kidney disease, stage 3b (HCC)   Type 2 diabetes with nephropathy (HCC)   AKI (acute kidney injury) (HCC)   Fall at home, initial encounter  Hospital Course: As per H&P written by Dr.Zierle-Ghosh on 02/23/22 Krista Payne is a 80 y.o. female with medical history significant of breast cancer, CKD, diabetes mellitus type 2 uncontrolled, hyperlipidemia, hypertension, macular degeneration, and more presents to the ED with a chief complaint of fall.  She reports she has had 2 falls today.  She does not know what caused her falls.  She is not sure if she had generalized weakness, dizziness, trip and fall, chest pain, shortness of breath, or any other symptoms.  She reports she does know she fell and she had to have help to get up.  She is alert and oriented x3.  She reports decreased appetite but no nausea, vomiting, abdominal pain, diarrhea, constipation.  She reports that she is not in any pain.  She has no dysuria, hematuria, frequency, urgency.  She has not had any dyspnea or cough.  She has not had any fevers.  Patient has no other complaints at this time.   Patient does not smoke, does not drink alcohol, does not use  illicit drugs.  She reports she is not vaccinated for COVID.  Patient is full code.  Assessment and Plan: * Sepsis (HCC) -Sepsis features adequately improving with current therapy. -Continue to maintain adequate hydration and complete antibiotic therapy as instructed. -At discharge patient's sepsis features essentially resolved. -Patient met sepsis criteria at time of admission.  Appear to be secondary to E. coli UTI and bacteremia. -Microorganism resistant to Bactrim and ampicillin.   Fall at home, initial encounter -CT head is without acute findings -No reported injury -PT eval and treat has recommended home health PT; which has been arranged at discharge..  Acute renal failure superimposed on stage 3b chronic kidney disease (HCC) -Creatinine 2.13 at time of admission -Secondary to sepsis, prerenal azotemia and continue use of nephrotoxic agents. -Continue to minimize the use of nephrotoxic agents and maintain adequate hydration -Creatinine back to baseline at discharge.   Type 2 diabetes with nephropathy (HCC) -Last hemoglobin A1c 13.2 -Modified carbohydrate diet has been discussed with patient -Home dose hypoglycemic agent has been resumed; recommending close follow-up with PCP to further adjust her regimen as needed.   Chronic kidney disease, stage 3b (HCC) -Patient with chronic renal failure secondary to diabetes -A stage IIIb at baseline based on GFR. -Acute kidney injury presentation in the setting of sepsis and prerenal azotemia we will continue nephrotoxic agents. -Renal function back to baseline at discharge -Advised to maintain adequate hydration -Safe to resume home Benicar and the rest of her antihypertensive agents.  Acute metabolic encephalopathy -In the setting of UTI/sepsis. -Resolved and  with mentation back to baseline at discharge -Complete antibiotic therapy and maintain adequate hydration.  Lactic acidosis -Secondary to sepsis -Resolved after fluid  resuscitation -Patient advised to maintain adequate hydration.   Urinary tract infection without hematuria -Secondary to E. coli infection -Advised to maintain adequate hydration -Complete antibiotic therapy as instructed.  Essential hypertension -Blood pressure stable and rising at time of discharge. -Resume home antihypertensive regimen -Patient advised to follow heart healthy diet -Reassess blood pressure at follow-up visit with further adjustment to antihypertensive regimen as needed.    Mixed hyperlipidemia -Continue statin -Heart healthy diet discussed with patient.  Morbid obesity -Body mass index is 36.69 kg/m.  -Low calorie diet, portion control and increase physical activity discussed with patient.  Stage I pressure injury in sacral area -No signs of superimposed infection -Present at time of admission -Continue preventive measures and constant repositioning. . Consultants: None Procedures performed: See below for x-ray reports. Disposition: Home with home health services. Diet recommendation: Heart healthy modified carbohydrate diet.  DISCHARGE MEDICATION: Allergies as of 02/26/2022   No Known Allergies      Medication List     TAKE these medications    amLODipine 5 MG tablet Commonly known as: NORVASC Take 0.5 tablets (2.5 mg total) by mouth daily. What changed: how much to take   anastrozole 1 MG tablet Commonly known as: ARIMIDEX Take 1 tablet (1 mg total) by mouth daily.   atenolol 25 MG tablet Commonly known as: TENORMIN Take 25 mg by mouth daily.   buPROPion 150 MG 24 hr tablet Commonly known as: WELLBUTRIN XL Take 150 mg by mouth daily as needed (feeling moody/depressed).   cefUROXime 500 MG tablet Commonly known as: CEFTIN Take 1 tablet (500 mg total) by mouth 2 (two) times daily with a meal for 8 days.   furosemide 20 MG tablet Commonly known as: LASIX Take 10 mg by mouth daily as needed for fluid.   insulin glargine 100  UNIT/ML injection Commonly known as: LANTUS Inject 0.15 mLs (15 Units total) into the skin at bedtime.   levocetirizine 5 MG tablet Commonly known as: XYZAL Take 1 tablet (5 mg total) by mouth daily as needed for allergies.   olmesartan 5 MG tablet Commonly known as: BENICAR Take 5 mg by mouth daily.   Pen Needles 31G X 6 MM Misc Use to inject insulin once daily   simvastatin 20 MG tablet Commonly known as: ZOCOR Take 1 tablet (20 mg total) by mouth at bedtime.   Evaristo Bury FlexTouch 100 UNIT/ML FlexTouch Pen Generic drug: insulin degludec Inject 15 Units into the skin at bedtime.   zinc oxide 20 % ointment Commonly known as: [employer] Zinc Oxide Apply 1 application. topically 3 (three) times daily as needed for irritation (skin breakdown.).               Discharge Care Instructions  (From admission, onward)           Start     Ordered   02/26/22 0000  Discharge wound care:       Comments: Keep area clean and dry; constant repositioning to avoid pressure in the affected area.  Continue to follow recommendations for preventive measures (the use of barrier creams).   02/26/22 0859            Follow-up Information     Krista Stabile, MD. Schedule an appointment as soon as possible for a visit in 10 day(s).   Specialty: Internal Medicine Contact information: [address] [place] [address] [phone]                Discharge Exam: Filed Weights   02/22/22 2007 02/23/22 0006  Weight: 88 kg 91 kg   General exam: Alert, awake, oriented x 3; no chest pain, no nausea, no vomiting, no dysuria.  Feeling ready to go home. Respiratory system: Clear to auscultation. Respiratory effort normal.  Good saturation on room air. Cardiovascular system:RRR. No rubs or gallops. Gastrointestinal system: Abdomen is obese, nondistended, soft and nontender. No organomegaly or masses felt. Normal bowel sounds heard. Central nervous system: Alert and oriented. No  focal neurological deficits. Extremities: No cyanosis or clubbing. Skin: No petechiae.  Stage I sacral pressure injury present at time of admission without signs of superimposed infection. Psychiatry: Judgement and insight appear normal. Mood & affect appropriate.    Condition at discharge: Stable and improved.  The results of significant diagnostics from this hospitalization (including imaging, microbiology, ancillary and laboratory) are listed below for reference.   Imaging Studies: CT Head Wo Contrast  Result Date: 02/22/2022 CLINICAL DATA:  Multiple fall EXAM: CT HEAD WITHOUT CONTRAST TECHNIQUE: Contiguous axial images were obtained from the base of the skull through the vertex without intravenous contrast. RADIATION DOSE REDUCTION: This exam was performed according to the departmental dose-optimization program which includes automated exposure control, adjustment of the mA and/or kV according to patient size and/or use of iterative reconstruction technique. COMPARISON:  CT brain 11/13/2021 FINDINGS: Brain: No acute territorial infarction, hemorrhage or intracranial mass. Mild atrophy. Patchy white matter hypodensity consistent with chronic small vessel ischemic change. Stable ventricle size. Vascular: No hyperdense vessels.  Carotid vascular calcification Skull: Normal. Negative for fracture or focal lesion. Sinuses/Orbits: No acute finding. Other: None IMPRESSION: 1. No CT evidence for acute intracranial abnormality. 2. Mild atrophy and chronic small vessel ischemic changes of the white matter. Electronically Signed   By: Jasmine Pang M.D.   On: 02/22/2022 22:58   DG Chest Portable 1 View  Result Date: 02/22/2022 CLINICAL DATA:  Weakness.  Sepsis protocol. EXAM: PORTABLE CHEST 1 VIEW COMPARISON:  None. FINDINGS: Heart is upper limits of normal for size. This is exaggerated by low lung volumes. No edema or effusion is present. No focal airspace disease is present. The visualized soft tissues  and bony thorax are unremarkable. IMPRESSION: 1. Low lung volumes. 2. No acute cardiopulmonary disease. Electronically Signed   By: Marin Roberts M.D.   On: 02/22/2022 20:45    Microbiology: Results for orders placed or performed during the hospital encounter of 02/22/22  Culture, blood (Routine x 2)     Status: None (Preliminary result)   Collection Time: 02/22/22  8:21 PM   Specimen: Right Antecubital; Blood  Result Value Ref Range Status   Specimen Description   Final    RIGHT ANTECUBITAL Performed at [facility], [address], [place], [place] [address]    Special Requests   Final    BOTTLES DRAWN AEROBIC AND ANAEROBIC Blood Culture results may not be optimal due to an excessive volume of blood received in culture bottles Performed at [facility], [address] [place], [place] [address]    Culture  Setup Time   Final    GRAM NEGATIVE RODS IN BOTH AEROBIC AND ANAEROBIC BOTTLES Gram Stain Report Called to,Read Back By and Verified With: HOWERTON M. AT 1107AM ON [id number] BY Raj Janus, M @ [facility] ON 04.29.23 BY BOWMAN, L CRITICAL VALUE NOTED.  VALUE IS CONSISTENT  WITH PREVIOUSLY REPORTED AND CALLED VALUE. Performed at [facility], [address], [place], [place] [address]    Culture GRAM NEGATIVE RODS  Final   Report Status PENDING  Incomplete  Urine Culture     Status: Abnormal   Collection Time: 02/22/22  8:21 PM   Specimen: In/Out Cath Urine  Result Value Ref Range Status   Specimen Description   Final    IN/OUT CATH URINE Performed at [facility], [address], [place], [place] [address]    Special Requests   Final    NONE Performed at [facility], [address], [place], [place] [address]    Culture >=100,000 COLONIES/mL ESCHERICHIA COLI (A)  Final   Report Status 02/25/2022 FINAL  Final   Organism ID, Bacteria ESCHERICHIA COLI (A)  Final      Susceptibility   Escherichia coli - MIC*    AMPICILLIN >=32 RESISTANT Resistant     CEFAZOLIN <=4  SENSITIVE Sensitive     CEFEPIME <=0.12 SENSITIVE Sensitive     CEFTRIAXONE <=0.25 SENSITIVE Sensitive     CIPROFLOXACIN <=0.25 SENSITIVE Sensitive     GENTAMICIN <=1 SENSITIVE Sensitive     IMIPENEM <=0.25 SENSITIVE Sensitive     NITROFURANTOIN 32 SENSITIVE Sensitive     TRIMETH/SULFA >=320 RESISTANT Resistant     AMPICILLIN/SULBACTAM 8 SENSITIVE Sensitive     PIP/TAZO <=4 SENSITIVE Sensitive     * >=100,000 COLONIES/mL ESCHERICHIA COLI  Culture, blood (Routine x 2)     Status: None (Preliminary result)   Collection Time: 02/22/22  8:56 PM   Specimen: BLOOD LEFT ARM  Result Value Ref Range Status   Specimen Description   Final    BLOOD LEFT ARM Performed at [facility], [address] [place], [place] [address]    Special Requests   Final    BOTTLES DRAWN AEROBIC AND ANAEROBIC Blood Culture results may not be optimal due to an excessive volume of blood received in culture bottles Performed at [facility], [address] [place], [place] [address]    Culture  Setup Time   Final    GRAM NEGATIVE RODS IN BOTH AEROBIC AND ANAEROBIC BOTTLES Gram Stain Report Called to,Read Back By and Verified With: RAY A. AT 1344 ON [id number] BY THOMPSON S. CRITICAL RESULT CALLED TO, READ BACK BY AND VERIFIED WITH: PHARMD LORI POOLE ON 02/23/22 @ 1727 BY DRT Performed at [facility], [address] [place], [place] [address]    Culture GRAM NEGATIVE RODS  Final   Report Status PENDING  Incomplete  Resp Panel by RT-PCR (Flu A&B, Covid) Nasopharyngeal Swab     Status: None   Collection Time: 02/22/22  8:56 PM   Specimen: Nasopharyngeal Swab; Nasopharyngeal(NP) swabs in vial transport medium  Result Value Ref Range Status   SARS Coronavirus 2 by RT PCR NEGATIVE NEGATIVE Final    Comment: (NOTE) SARS-CoV-2 target nucleic acids are NOT DETECTED.  The SARS-CoV-2 RNA is generally detectable in upper respiratory specimens during the acute phase of infection. The lowest concentration of SARS-CoV-2  viral copies this assay can detect is 138 copies/mL. A negative result does not preclude SARS-Cov-2 infection and should not be used as the sole basis for treatment or other patient management decisions. A negative result may occur with  improper specimen collection/handling, submission of specimen other than nasopharyngeal swab, presence of viral mutation(s) within the areas targeted by this assay, and inadequate number of viral copies(<138 copies/mL). A negative result must be combined  with clinical observations, patient history, and epidemiological information. The expected result is Negative.  Fact Sheet for Patients:  BloggerCourse.com  Fact Sheet for Healthcare Providers:  SeriousBroker.it  This test is no t yet approved or cleared by the [place] FDA and  has been authorized for detection and/or diagnosis of SARS-CoV-2 by FDA under an Emergency Use Authorization (EUA). This EUA will remain  in effect (meaning this test can be used) for the duration of the COVID-19 declaration under Section 564(b)(1) of the Act, 21 U.S.C.section 360bbb-3(b)(1), unless the authorization is terminated  or revoked sooner.       Influenza A by PCR NEGATIVE NEGATIVE Final   Influenza B by PCR NEGATIVE NEGATIVE Final    Comment: (NOTE) The Xpert Xpress SARS-CoV-2/FLU/RSV plus assay is intended as an aid in the diagnosis of influenza from Nasopharyngeal swab specimens and should not be used as a sole basis for treatment. Nasal washings and aspirates are unacceptable for Xpert Xpress SARS-CoV-2/FLU/RSV testing.  Fact Sheet for Patients: BloggerCourse.com  Fact Sheet for Healthcare Providers: SeriousBroker.it  This test is not yet approved or cleared by the [place] FDA and has been authorized for detection and/or diagnosis of SARS-CoV-2 by FDA under an Emergency Use Authorization  (EUA). This EUA will remain in effect (meaning this test can be used) for the duration of the COVID-19 declaration under Section 564(b)(1) of the Act, 21 U.S.C. section 360bbb-3(b)(1), unless the authorization is terminated or revoked.  Performed at [facility], [address], [place], [place] [address]   Blood Culture ID Panel (Reflexed)     Status: Abnormal   Collection Time: 02/22/22  8:56 PM  Result Value Ref Range Status   Enterococcus faecalis NOT DETECTED NOT DETECTED Final   Enterococcus Faecium NOT DETECTED NOT DETECTED Final   Listeria monocytogenes NOT DETECTED NOT DETECTED Final   Staphylococcus species NOT DETECTED NOT DETECTED Final   Staphylococcus aureus (BCID) NOT DETECTED NOT DETECTED Final   Staphylococcus epidermidis NOT DETECTED NOT DETECTED Final   Staphylococcus lugdunensis NOT DETECTED NOT DETECTED Final   Streptococcus species NOT DETECTED NOT DETECTED Final   Streptococcus agalactiae NOT DETECTED NOT DETECTED Final   Streptococcus pneumoniae NOT DETECTED NOT DETECTED Final   Streptococcus pyogenes NOT DETECTED NOT DETECTED Final   A.calcoaceticus-baumannii NOT DETECTED NOT DETECTED Final   Bacteroides fragilis NOT DETECTED NOT DETECTED Final   Enterobacterales DETECTED (A) NOT DETECTED Final    Comment: Enterobacterales represent a large order of gram negative bacteria, not a single organism. CRITICAL RESULT CALLED TO, READ BACK BY AND VERIFIED WITH: PHARMD LORI POOLE ON 02/23/22 @ 1732 BY DRT    Enterobacter cloacae complex NOT DETECTED NOT DETECTED Final   Escherichia coli DETECTED (A) NOT DETECTED Final    Comment: CRITICAL RESULT CALLED TO, READ BACK BY AND VERIFIED WITH: PHARMD LORI POOLE ON 02/23/22 @ 1732 BY DRT    Klebsiella aerogenes NOT DETECTED NOT DETECTED Final   Klebsiella oxytoca NOT DETECTED NOT DETECTED Final   Klebsiella pneumoniae NOT DETECTED NOT DETECTED Final   Proteus species NOT DETECTED NOT DETECTED Final   Salmonella species NOT  DETECTED NOT DETECTED Final   Serratia marcescens NOT DETECTED NOT DETECTED Final   Haemophilus influenzae NOT DETECTED NOT DETECTED Final   Neisseria meningitidis NOT DETECTED NOT DETECTED Final   Pseudomonas aeruginosa NOT DETECTED NOT DETECTED Final   Stenotrophomonas maltophilia NOT DETECTED NOT DETECTED Final   Candida albicans NOT DETECTED NOT DETECTED Final   Candida auris NOT  DETECTED NOT DETECTED Final   Candida glabrata NOT DETECTED NOT DETECTED Final   Candida krusei NOT DETECTED NOT DETECTED Final   Candida parapsilosis NOT DETECTED NOT DETECTED Final   Candida tropicalis NOT DETECTED NOT DETECTED Final   Cryptococcus neoformans/gattii NOT DETECTED NOT DETECTED Final   CTX-M ESBL NOT DETECTED NOT DETECTED Final   Carbapenem resistance IMP NOT DETECTED NOT DETECTED Final   Carbapenem resistance KPC NOT DETECTED NOT DETECTED Final   Carbapenem resistance NDM NOT DETECTED NOT DETECTED Final   Carbapenem resist OXA 48 LIKE NOT DETECTED NOT DETECTED Final   Carbapenem resistance VIM NOT DETECTED NOT DETECTED Final    Comment: Performed at [facility], [address], [place], [place] [address]    Labs: CBC: Recent Labs  Lab 02/22/22 2021 02/23/22 0526 02/24/22 0432 02/26/22 0402  WBC 19.0* 13.8* 11.6* 7.1  NEUTROABS 16.6* 12.4*  --   --   HGB 10.7* 9.7* 8.8* 9.0*  HCT 33.7* 30.7* 29.3* 29.1*  MCV 88.7 90.0 90.2 90.1  PLT 394 314 295 364   Basic Metabolic Panel: Recent Labs  Lab 02/22/22 2021 02/23/22 0526 02/24/22 0432 02/26/22 0402  NA 130* 134* 134* 136  K 4.9 4.4 3.7 4.0  CL 99 105 106 107  CO2 20* 21* 21* 24  GLUCOSE 292* 183* 153* 122*  BUN 38* 34* 32* 22  CREATININE 2.13* 1.74* 1.62* 1.31*  CALCIUM 9.9 9.2 8.3* 8.3*  MG  --  1.7  --   --    Liver Function Tests: Recent Labs  Lab 02/22/22 2021 02/23/22 0526  AST 23 19  ALT 15 12  ALKPHOS 68 63  BILITOT 0.8 0.6  PROT 7.5 6.7  ALBUMIN 3.5 2.9*   CBG: Recent Labs  Lab 02/25/22 1144  02/25/22 1640 02/25/22 1701 02/25/22 2110 02/26/22 0732  GLUCAP 211* 195* 173* 232* 132*    Discharge time spent: greater than 30 minutes.  Signed: Vassie Loll, MD [facility] 02/26/2022

## 2022-02-26 NOTE — TOC Transition Note (Addendum)
Transition of Care (TOC) - CM/SW Discharge Note ? ? ?Patient Details  ?Name: Krista Payne ?MRN: [id number] ?Date of Birth: 1941-06-01 ? ?Transition of Care (TOC) CM/SW Contact:  ?[name], LCSW ?Phone Number: ?02/26/2022, 11:08 AM ? ? ?Clinical Narrative:    ?Per daughter, [name], patient from home alone. Active with HH with [facility]. [name] is working with [employer] to identify if she can get private duty assistance for patient.  ?[name] with [facility] advised of patient's discharge today. ? ?Final next level of care: [place] ?Barriers to Discharge: No Barriers Identified ? ? ?Patient Goals and CMS Choice ?Patient states their goals for this hospitalization and ongoing recovery are:: home wiht hh ?  ?  ? ?Discharge Placement ?  ?           ?  ?  ?  ?  ? ?Discharge Plan and Services ?  ?  ?Post Acute Care Choice: Resumption of Svcs/PTA Provider          ?  ?  ?  ?  ?  ?HH Arranged: Therapist, sports, PT ?[place] Agency: [place] ?Date HH Agency Contacted: 02/26/22 ?Time [place]: 1107 ?Representative spoke with at [place]: [place] ? ?Social Determinants of Health (SDOH) Interventions ?  ? ? ?Readmission Risk Interventions ?   ? View : No data to display.  ?  ?  ?  ? ? ? ? ? ?

## 2022-02-26 NOTE — Assessment & Plan Note (Signed)
-  Patient with chronic renal failure secondary to diabetes ?-A stage IIIb at baseline based on GFR. ?-Acute kidney injury presentation in the setting of sepsis and prerenal azotemia we will continue nephrotoxic agents. ?-Renal function back to baseline at discharge ?-Advised to maintain adequate hydration ?-Safe to resume home Benicar and the rest of her antihypertensive agents. ?

## 2022-02-26 NOTE — Progress Notes (Signed)
IV removed and discharge instructions reviewed with patient and daughter.  Scripts sent to pharmacy. Transported by WC to entrance and daughter to drive home. ?

## 2022-02-26 NOTE — Care Management Important Message (Signed)
Important Message ? ?Patient Details  ?Name: Krista Payne ?MRN: [id number] ?Date of Birth: September 10, 1941 ? ? ?Medicare Important Message Given:  Yes ? ? ? ? ?Tommy Medal ?02/26/2022, 11:11 AM ?

## 2022-03-01 NOTE — Patient Outreach (Signed)
[place] [facility]) Care Management ? ?03/01/2022 ? ?Krista Payne ?1941/05/16 ?[id number] ? ? ?Received hospital referral from Dellis Anes for RN case manager for chronic disease management services. Pcp is embedded sent referral to [facility] for follow up. ? ?Philmore Pali ?[place] Management Assistant ?[phone] ? ?

## 2022-03-08 DIAGNOSIS — I10 Essential (primary) hypertension: Secondary | ICD-10-CM | POA: Diagnosis not present

## 2022-03-08 DIAGNOSIS — N184 Chronic kidney disease, stage 4 (severe): Secondary | ICD-10-CM | POA: Diagnosis not present

## 2022-03-08 DIAGNOSIS — E1121 Type 2 diabetes mellitus with diabetic nephropathy: Secondary | ICD-10-CM | POA: Diagnosis not present

## 2022-03-08 DIAGNOSIS — R69 Illness, unspecified: Secondary | ICD-10-CM | POA: Diagnosis not present

## 2022-03-08 DIAGNOSIS — L89151 Pressure ulcer of sacral region, stage 1: Secondary | ICD-10-CM | POA: Diagnosis not present

## 2022-03-08 DIAGNOSIS — Z758 Other problems related to medical facilities and other health care: Secondary | ICD-10-CM | POA: Diagnosis not present

## 2022-03-08 DIAGNOSIS — R296 Repeated falls: Secondary | ICD-10-CM | POA: Diagnosis not present

## 2022-03-08 DIAGNOSIS — Z741 Need for assistance with personal care: Secondary | ICD-10-CM | POA: Diagnosis not present

## 2022-03-09 ENCOUNTER — Ambulatory Visit: Payer: Medicare HMO | Admitting: Nurse Practitioner

## 2022-03-09 DIAGNOSIS — E1165 Type 2 diabetes mellitus with hyperglycemia: Secondary | ICD-10-CM | POA: Diagnosis not present

## 2022-03-13 ENCOUNTER — Other Ambulatory Visit: Payer: Self-pay | Admitting: Nurse Practitioner

## 2022-03-13 DIAGNOSIS — E1122 Type 2 diabetes mellitus with diabetic chronic kidney disease: Secondary | ICD-10-CM

## 2022-03-16 ENCOUNTER — Ambulatory Visit (INDEPENDENT_AMBULATORY_CARE_PROVIDER_SITE_OTHER): Payer: Medicare HMO | Admitting: Nurse Practitioner

## 2022-03-16 ENCOUNTER — Encounter: Payer: Self-pay | Admitting: Nurse Practitioner

## 2022-03-16 VITALS — BP 157/69 | HR 62 | Ht 60.0 in | Wt 192.0 lb

## 2022-03-16 DIAGNOSIS — E1122 Type 2 diabetes mellitus with diabetic chronic kidney disease: Secondary | ICD-10-CM

## 2022-03-16 DIAGNOSIS — E782 Mixed hyperlipidemia: Secondary | ICD-10-CM | POA: Diagnosis not present

## 2022-03-16 DIAGNOSIS — N184 Chronic kidney disease, stage 4 (severe): Secondary | ICD-10-CM

## 2022-03-16 DIAGNOSIS — E1121 Type 2 diabetes mellitus with diabetic nephropathy: Secondary | ICD-10-CM | POA: Diagnosis not present

## 2022-03-16 MED ORDER — INSULIN GLARGINE 100 UNIT/ML ~~LOC~~ SOLN: 12.0000 [IU] | Freq: Every day | SUBCUTANEOUS | 3 refills | Status: DC

## 2022-03-16 NOTE — Patient Instructions (Signed)
Diabetes Mellitus and Foot Care Foot care is an important part of your health, especially when you have diabetes. Diabetes may cause you to have problems because of poor blood flow (circulation) to your feet and legs, which can cause your skin to: Become thinner and drier. Break more easily. Heal more slowly. Peel and crack. You may also have nerve damage (neuropathy) in your legs and feet, causing decreased feeling in them. This means that you may not notice minor injuries to your feet that could lead to more serious problems. Noticing and addressing any potential problems early is the best way to prevent future foot problems. How to care for your feet Foot hygiene  Wash your feet daily with warm water and mild soap. Do not use hot water. Then, pat your feet and the areas between your toes until they are completely dry. Do not soak your feet as this can dry your skin. Trim your toenails straight across. Do not dig under them or around the cuticle. File the edges of your nails with an emery board or nail file. Apply a moisturizing lotion or petroleum jelly to the skin on your feet and to dry, brittle toenails. Use lotion that does not contain alcohol and is unscented. Do not apply lotion between your toes. Shoes and socks Wear clean socks or stockings every day. Make sure they are not too tight. Do not wear knee-high stockings since they may decrease blood flow to your legs. Wear shoes that fit properly and have enough cushioning. Always look in your shoes before you put them on to be sure there are no objects inside. To break in new shoes, wear them for just a few hours a day. This prevents injuries on your feet. Wounds, scrapes, corns, and calluses  Check your feet daily for blisters, cuts, bruises, sores, and redness. If you cannot see the bottom of your feet, use a mirror or ask someone for help. Do not cut corns or calluses or try to remove them with medicine. If you find a minor scrape,  cut, or break in the skin on your feet, keep it and the skin around it clean and dry. You may clean these areas with mild soap and water. Do not clean the area with peroxide, alcohol, or iodine. If you have a wound, scrape, corn, or callus on your foot, look at it several times a day to make sure it is healing and not infected. Check for: Redness, swelling, or pain. Fluid or blood. Warmth. Pus or a bad smell. General tips Do not cross your legs. This may decrease blood flow to your feet. Do not use heating pads or hot water bottles on your feet. They may burn your skin. If you have lost feeling in your feet or legs, you may not know this is happening until it is too late. Protect your feet from hot and cold by wearing shoes, such as at the beach or on hot pavement. Schedule a complete foot exam at least once a year (annually) or more often if you have foot problems. Report any cuts, sores, or bruises to your health care provider immediately. Where to find more information American Diabetes Association: www.diabetes.org Association of Diabetes Care & Education Specialists: www.diabeteseducator.org Contact a health care provider if: You have a medical condition that increases your risk of infection and you have any cuts, sores, or bruises on your feet. You have an injury that is not healing. You have redness on your legs or feet. You   feel burning or tingling in your legs or feet. You have pain or cramps in your legs and feet. Your legs or feet are numb. Your feet always feel cold. You have pain around any toenails. Get help right away if: You have a wound, scrape, corn, or callus on your foot and: You have pain, swelling, or redness that gets worse. You have fluid or blood coming from the wound, scrape, corn, or callus. Your wound, scrape, corn, or callus feels warm to the touch. You have pus or a bad smell coming from the wound, scrape, corn, or callus. You have a fever. You have a red  line going up your leg. Summary Check your feet every day for blisters, cuts, bruises, sores, and redness. Apply a moisturizing lotion or petroleum jelly to the skin on your feet and to dry, brittle toenails. Wear shoes that fit properly and have enough cushioning. If you have foot problems, report any cuts, sores, or bruises to your health care provider immediately. Schedule a complete foot exam at least once a year (annually) or more often if you have foot problems. This information is not intended to replace advice given to you by your health care provider. Make sure you discuss any questions you have with your health care provider. Document Revised: 05/05/2020 Document Reviewed: 05/05/2020 Elsevier Patient Education  2023 Elsevier Inc.  

## 2022-03-16 NOTE — Progress Notes (Signed)
Endocrinology Follow Up Note       03/16/2022, 11:39 AM   Subjective:    Patient ID: Krista Payne, female    DOB: 1941/06/25.  Krista Payne is being seen in follow up after being seen in consultation for management of currently uncontrolled symptomatic diabetes requested by  Celene Squibb, MD.   Past Medical History:  Diagnosis Date   Breast cancer [facility])    R   Chronic kidney disease    Diabetes mellitus without complication ([place])    Hyperlipidemia    Hypertension    Macular degeneration    Osteoarthritis     Past Surgical History:  Procedure Laterality Date   CATARACT EXTRACTION W/PHACO Right 02/07/2015   Procedure: CATARACT EXTRACTION PHACO AND INTRAOCULAR LENS PLACEMENT ([place]);  Surgeon: Tonny Branch, MD;  Location: [facility];  Service: Ophthalmology;  Laterality: Right;  CDE:12.72   CATARACT EXTRACTION W/PHACO Left 02/24/2015   Procedure: CATARACT EXTRACTION PHACO AND INTRAOCULAR LENS PLACEMENT LEFT EYE;  Surgeon: Tonny Branch, MD;  Location: [facility];  Service: Ophthalmology;  Laterality: Left;  CDE 11.69   COLONOSCOPY      Social History   Socioeconomic History   Marital status: Widowed    Spouse name: Not on file   Number of children: 3   Years of education: Not on file   Highest education level: Not on file  Occupational History   Not on file  Tobacco Use   Smoking status: Never   Smokeless tobacco: Never  Vaping Use   Vaping Use: Never used  Substance and Sexual Activity   Alcohol use: No   Drug use: No   Sexual activity: Never  Other Topics Concern   Not on file  Social History Narrative   Not on file   Social Determinants of Health   Financial Resource Strain: Not on file  Food Insecurity: Not on file  Transportation Needs: Not on file  Physical Activity: Not on file  Stress: Not on file  Social Connections: Not on file    Family History  Problem Relation Age of Onset   Macular  degeneration Mother    Diabetes Father    Heart disease Daughter     Outpatient Encounter Medications as of 03/16/2022  Medication Sig   amLODipine (NORVASC) 5 MG tablet Take 0.5 tablets (2.5 mg total) by mouth daily.   anastrozole (ARIMIDEX) 1 MG tablet Take 1 tablet (1 mg total) by mouth daily.   atenolol (TENORMIN) 25 MG tablet Take 25 mg by mouth daily.   buPROPion (WELLBUTRIN XL) 150 MG 24 hr tablet Take 300 mg by mouth daily as needed (feeling moody/depressed).   furosemide (LASIX) 20 MG tablet Take 10 mg by mouth daily as needed for fluid.   insulin glargine (LANTUS) 100 UNIT/ML injection Inject 0.12 mLs (12 Units total) into the skin at bedtime.   Insulin Pen Needle (PEN NEEDLES) 31G X 6 MM MISC Use to inject insulin once daily   levocetirizine (XYZAL) 5 MG tablet Take 1 tablet (5 mg total) by mouth daily as needed for allergies.   olmesartan (BENICAR) 5 MG tablet Take 5 mg by mouth daily.   simvastatin (ZOCOR) 20  MG tablet Take 1 tablet (20 mg total) by mouth at bedtime.   [DISCONTINUED] insulin degludec (TRESIBA FLEXTOUCH) 100 UNIT/ML FlexTouch Pen Inject 15 Units into the skin at bedtime.   [DISCONTINUED] insulin glargine (LANTUS) 100 UNIT/ML injection Inject 0.15 mLs (15 Units total) into the skin at bedtime.   [DISCONTINUED] zinc oxide (MEIJER ZINC OXIDE) 20 % ointment Apply 1 application. topically 3 (three) times daily as needed for irritation (skin breakdown.).   No facility-administered encounter medications on file as of 03/16/2022.    ALLERGIES: No Known Allergies  VACCINATION STATUS: Immunization History  Administered Date(s) Administered   Influenza Split 11/13/2011   Influenza Whole 07/20/2008, 08/10/2009, 10/18/2010   Influenza-Unspecified 07/29/2013, 08/12/2020   Pneumococcal Polysaccharide-23 07/20/2008   Td 07/29/1998, 08/10/2009    Diabetes She presents for her follow-up diabetic visit. She has type 2 diabetes mellitus. Her disease course has been  improving. There are no hypoglycemic associated symptoms. Associated symptoms include fatigue and polyuria. Pertinent negatives for diabetes include no polydipsia and no weight loss. Hypoglycemia complications include nocturnal hypoglycemia. Symptoms are improving. Diabetic complications include nephropathy and peripheral neuropathy. Risk factors for coronary artery disease include diabetes mellitus, dyslipidemia, family history, hypertension, sedentary lifestyle, post-menopausal and obesity. Current diabetic treatment includes insulin injections. She is compliant with treatment most of the time. Her weight is fluctuating minimally. She is following a generally healthy diet. When asked about meal planning, she reported none (gets meals on wheels). She has not had a previous visit with a dietitian. She participates in exercise intermittently. Her home blood glucose trend is decreasing steadily. Her overall blood glucose range is 140-180 mg/dl. (She presents today, accompanied by both daughters, with her CGM showing at target fasting and slightly above target postprandial glycemic profile.  She was not due for another A1c today.  Analysis of her CGM shows TIR 50%, TAR 48%, TBR <2% with a GMI of 7.6%.  She denies any significant hypoglycemia.) An ACE inhibitor/angiotensin II receptor blocker is being taken. She sees a podiatrist.Eye exam is current.  Hyperlipidemia This is a chronic problem. The current episode started more than 1 year ago. The problem is controlled. Recent lipid tests were reviewed and are variable. Exacerbating diseases include chronic renal disease, diabetes and obesity. Factors aggravating her hyperlipidemia include beta blockers and fatty foods. Current antihyperlipidemic treatment includes statins. The current treatment provides moderate improvement of lipids. Compliance problems include adherence to diet and adherence to exercise.  Risk factors for coronary artery disease include diabetes  mellitus, dyslipidemia, family history, hypertension, obesity, post-menopausal and a sedentary lifestyle.  Hypertension This is a chronic problem. The current episode started more than 1 year ago. The problem has been resolved since onset. The problem is controlled. There are no associated agents to hypertension. Risk factors for coronary artery disease include diabetes mellitus, dyslipidemia, family history, obesity and sedentary lifestyle. Past treatments include calcium channel blockers, angiotensin blockers and diuretics. There are no compliance problems.  Hypertensive end-organ damage includes kidney disease. Identifiable causes of hypertension include chronic renal disease.    Review of systems  Constitutional: + Minimally fluctuating body weight,  current Body mass index is 37.5 kg/m. , no fatigue, no subjective hyperthermia, no subjective hypothermia Eyes: no blurry vision, no xerophthalmia ENT: no sore throat, no nodules palpated in throat, no dysphagia/odynophagia, no hoarseness Cardiovascular: no chest pain, no shortness of breath, no palpitations, no leg swelling Respiratory: no cough, no shortness of breath Gastrointestinal: no nausea/vomiting/diarrhea Musculoskeletal: no muscle/joint aches- has had multiple falls  in recent months, walks with cane Skin: no rashes, no hyperemia Neurological: no tremors, no numbness, no tingling, no dizziness Psychiatric: no depression, no anxiety  Objective:     BP (!) 157/69   Pulse 62   Ht 5' (1.524 m)   Wt 192 lb (87.1 kg)   BMI 37.50 kg/m   Wt Readings from Last 3 Encounters:  03/16/22 192 lb (87.1 kg)  02/23/22 200 lb 9.9 oz (91 kg)  02/15/22 196 lb (88.9 kg)     BP Readings from Last 3 Encounters:  03/16/22 (!) 157/69  02/26/22 (!) 140/56  02/15/22 (!) 127/55      Physical Exam- Limited  Constitutional:  Body mass index is 37.5 kg/m. , not in acute distress, normal state of mind Eyes:  EOMI, no exophthalmos Neck:  Supple Cardiovascular: RRR, no murmurs, rubs, or gallops, no edema Respiratory: Adequate breathing efforts, no crackles, rales, rhonchi, or wheezing Musculoskeletal: no gross deformities, strength intact in all four extremities, no gross restriction of joint movements, has had multiple falls in recent months, walks with cane Skin:  no rashes, no hyperemia Neurological: no tremor with outstretched hands   Diabetic Foot Exam - Simple   Simple Foot Form Diabetic Foot exam was performed with the following findings: Yes 03/16/2022 11:38 AM  Visual Inspection See comments: Yes Sensation Testing See comments: Yes Pulse Check Posterior Tibialis and Dorsalis pulse intact bilaterally: Yes Comments Dry flaky skin bilaterally.  Areas of erythema, warm to touch on right anterior foot.  No sensation to monofilament tool bilaterally     CMP ( most recent) CMP     Component Value Date/Time   NA 136 02/26/2022 0402   NA 137 06/12/2021 0000   K 4.0 02/26/2022 0402   CL 107 02/26/2022 0402   CO2 24 02/26/2022 0402   GLUCOSE 122 (H) 02/26/2022 0402   BUN 22 02/26/2022 0402   BUN 41 (A) 01/05/2022 0000   CREATININE 1.31 (H) 02/26/2022 0402   CALCIUM 8.3 (L) 02/26/2022 0402   PROT 6.7 02/23/2022 0526   ALBUMIN 2.9 (L) 02/23/2022 0526   AST 19 02/23/2022 0526   ALT 12 02/23/2022 0526   ALKPHOS 63 02/23/2022 0526   BILITOT 0.6 02/23/2022 0526   GFRNONAA 41 (L) 02/26/2022 0402   GFRAA 43 (L) 06/01/2020 1259     Diabetic Labs (most recent): Lab Results  Component Value Date   HGBA1C 13.2 02/15/2022   HGBA1C 9.8 (H) 11/14/2021   HGBA1C 9.8 11/14/2021     Lipid Panel ( most recent) Lipid Panel     Component Value Date/Time   CHOL 173 06/12/2021 0000   TRIG 175 (A) 06/12/2021 0000   TRIG 175 (A) 06/12/2021 0000   HDL 51 06/12/2021 0000   CHOLHDL 3 02/20/2011 1051   VLDL 27.8 02/20/2011 1051   LDLCALC 92 06/12/2021 0000   LDLCALC 92 06/12/2021 0000   LDLDIRECT 168.8 10/19/2008  0914      Lab Results  Component Value Date   TSH 1.07 02/20/2011   TSH 1.70 10/18/2010   TSH 1.49 08/10/2009   TSH 1.23 07/20/2008   TSH 1.39 06/19/2007           Assessment & Plan:   1) Type 2 diabetes mellitus with stage 4 chronic kidney disease, without long-term current use of insulin ([place])  She presents today, accompanied by both daughters, with her CGM showing at target fasting and slightly above target postprandial glycemic profile.  She was not due for another  A1c today.  Analysis of her CGM shows TIR 50%, TAR 48%, TBR <2% with a GMI of 7.6%.  She denies any significant hypoglycemia.  - SOFI BRYARS has currently uncontrolled symptomatic type 2 DM since 81 years of age.   -Recent labs reviewed.  - I had a long discussion with her about the progressive nature of diabetes and the pathology behind its complications. -her diabetes is complicated by CKD and she remains at a high risk for more acute and chronic complications which include CAD, CVA, CKD, retinopathy, and neuropathy. These are all discussed in detail with her.  - Nutritional counseling repeated at each appointment due to patients tendency to fall back in to old habits.  - The patient admits there is a room for improvement in their diet and drink choices. -  Suggestion is made for the patient to avoid simple carbohydrates from their diet including Cakes, Sweet Desserts / Pastries, Ice Cream, Soda (diet and regular), Sweet Tea, Candies, Chips, Cookies, Sweet Pastries, Store Bought Juices, Alcohol in Excess of 1-2 drinks a day, Artificial Sweeteners, Coffee Creamer, and "Sugar-free" Products. This will help patient to have stable blood glucose profile and potentially avoid unintended weight gain.   - I encouraged the patient to switch to unprocessed or minimally processed complex starch and increased protein intake (animal or plant source), fruits, and vegetables.   - Patient is advised to stick to a routine  mealtimes to eat 3 meals a day and avoid unnecessary snacks (to snack only to correct hypoglycemia).  - I have approached her with the following individualized plan to manage her diabetes and patient agrees:   -She does have some tightening fasting readings.  Will lower Lantus to 12 units SQ nightly.   -she is encouraged to continue monitoring glucose at least twice daily, before breakfast and before bed (using her CGM), and to call the clinic if she has readings less than 70 or above 300 for 3 tests in a row.     - she is warned not to take insulin without proper monitoring per orders. - Adjustment parameters are given to her for hypo and hyperglycemia in writing.  - her Glipizide was discontinued, risk outweighs benefit for this patient given her CKD and advanced age, this medication increases her risk of severe hypoglycemia. - she is not a candidate for Metformin due to concurrent renal insufficiency.  - she will be considered for incretin therapy as appropriate next visit.  - Specific targets for  A1c; LDL, HDL, and Triglycerides were discussed with the patient.  2) Blood Pressure /Hypertension:  her blood pressure is controlled to target.   she is advised to continue her current medications including Norvasc 5 mg p.o. daily with breakfast, Atenolol 25 mg po daily, Lasix 10 mg po daily, and Benicar 5 mg po daily.  3) Lipids/Hyperlipidemia:    Review of her recent lipid panel from 06/12/21 showed controlled LDL at 92 and elevated triglycerides of 175 .  she is advised to continue Zocor 20 mg daily at bedtime.  Side effects and precautions discussed with her.  4)  Weight/Diet:  her Body mass index is 37.5 kg/m.  -  clearly complicating her diabetes care.   she is a candidate for weight loss. I discussed with her the fact that loss of 5 - 10% of her  current body weight will have the most impact on her diabetes management.  Exercise, and detailed carbohydrates information provided  -   detailed on  discharge instructions.  5) Chronic Care/Health Maintenance: -she is on ACEI/ARB and Statin medications and is encouraged to initiate and continue to follow up with Ophthalmology, Dentist, Podiatrist at least yearly or according to recommendations, and advised to stay away from smoking. I have recommended yearly flu vaccine and pneumonia vaccine at least every 5 years; moderate intensity exercise for up to 150 minutes weekly; and sleep for at least 7 hours a day.  - she is advised to maintain close follow up with Celene Squibb, MD for primary care needs, as well as her other providers for optimal and coordinated care.     I spent 40 minutes in the care of the patient today including review of labs from [place], Lipids, Thyroid Function, Hematology (current and previous including abstractions from other facilities); face-to-face time discussing  her blood glucose readings/logs, discussing hypoglycemia and hyperglycemia episodes and symptoms, medications doses, her options of short and long term treatment based on the latest standards of care / guidelines;  discussion about incorporating lifestyle medicine;  and documenting the encounter.    Please refer to Patient Instructions for Blood Glucose Monitoring and Insulin/Medications Dosing Guide"  in media tab for additional information. Please  also refer to " Patient Self Inventory" in the Media  tab for reviewed elements of pertinent patient history.  Krista Payne participated in the discussions, expressed understanding, and voiced agreement with the above plans.  All questions were answered to her satisfaction. she is encouraged to contact clinic should she have any questions or concerns prior to her return visit.     Follow up plan: - Return in about 3 months (around 06/16/2022) for Diabetes F/U with A1c in office, No previsit labs, Bring meter and logs.   Rayetta Pigg, [facility] [address] [place], [place] [address] Phone: [phone] Fax: [phone]  03/16/2022, 11:39 AM

## 2022-03-22 DIAGNOSIS — N184 Chronic kidney disease, stage 4 (severe): Secondary | ICD-10-CM | POA: Diagnosis not present

## 2022-04-02 DIAGNOSIS — E1122 Type 2 diabetes mellitus with diabetic chronic kidney disease: Secondary | ICD-10-CM | POA: Diagnosis not present

## 2022-04-02 DIAGNOSIS — U071 COVID-19: Secondary | ICD-10-CM | POA: Diagnosis not present

## 2022-04-02 DIAGNOSIS — L97519 Non-pressure chronic ulcer of other part of right foot with unspecified severity: Secondary | ICD-10-CM | POA: Diagnosis not present

## 2022-04-02 DIAGNOSIS — L89310 Pressure ulcer of right buttock, unstageable: Secondary | ICD-10-CM | POA: Diagnosis not present

## 2022-04-02 DIAGNOSIS — M6282 Rhabdomyolysis: Secondary | ICD-10-CM | POA: Diagnosis not present

## 2022-04-02 DIAGNOSIS — E11621 Type 2 diabetes mellitus with foot ulcer: Secondary | ICD-10-CM | POA: Diagnosis not present

## 2022-04-02 DIAGNOSIS — T796XXD Traumatic ischemia of muscle, subsequent encounter: Secondary | ICD-10-CM | POA: Diagnosis not present

## 2022-04-02 DIAGNOSIS — L03115 Cellulitis of right lower limb: Secondary | ICD-10-CM | POA: Diagnosis not present

## 2022-04-02 DIAGNOSIS — N390 Urinary tract infection, site not specified: Secondary | ICD-10-CM | POA: Diagnosis not present

## 2022-04-02 DIAGNOSIS — E1165 Type 2 diabetes mellitus with hyperglycemia: Secondary | ICD-10-CM | POA: Diagnosis not present

## 2022-04-03 ENCOUNTER — Ambulatory Visit: Payer: Medicare HMO | Admitting: Nutrition

## 2022-04-09 DIAGNOSIS — E1165 Type 2 diabetes mellitus with hyperglycemia: Secondary | ICD-10-CM | POA: Diagnosis not present

## 2022-04-12 DIAGNOSIS — E875 Hyperkalemia: Secondary | ICD-10-CM | POA: Diagnosis not present

## 2022-04-12 DIAGNOSIS — R809 Proteinuria, unspecified: Secondary | ICD-10-CM | POA: Diagnosis not present

## 2022-04-12 DIAGNOSIS — D638 Anemia in other chronic diseases classified elsewhere: Secondary | ICD-10-CM | POA: Diagnosis not present

## 2022-04-12 DIAGNOSIS — E1129 Type 2 diabetes mellitus with other diabetic kidney complication: Secondary | ICD-10-CM | POA: Diagnosis not present

## 2022-04-12 DIAGNOSIS — E1122 Type 2 diabetes mellitus with diabetic chronic kidney disease: Secondary | ICD-10-CM | POA: Diagnosis not present

## 2022-04-12 DIAGNOSIS — I129 Hypertensive chronic kidney disease with stage 1 through stage 4 chronic kidney disease, or unspecified chronic kidney disease: Secondary | ICD-10-CM | POA: Diagnosis not present

## 2022-04-12 DIAGNOSIS — N189 Chronic kidney disease, unspecified: Secondary | ICD-10-CM | POA: Diagnosis not present

## 2022-04-16 DIAGNOSIS — N189 Chronic kidney disease, unspecified: Secondary | ICD-10-CM | POA: Diagnosis not present

## 2022-04-16 DIAGNOSIS — N170 Acute kidney failure with tubular necrosis: Secondary | ICD-10-CM | POA: Diagnosis not present

## 2022-04-16 DIAGNOSIS — I129 Hypertensive chronic kidney disease with stage 1 through stage 4 chronic kidney disease, or unspecified chronic kidney disease: Secondary | ICD-10-CM | POA: Diagnosis not present

## 2022-04-16 DIAGNOSIS — D638 Anemia in other chronic diseases classified elsewhere: Secondary | ICD-10-CM | POA: Diagnosis not present

## 2022-04-16 DIAGNOSIS — Z6838 Body mass index (BMI) 38.0-38.9, adult: Secondary | ICD-10-CM | POA: Diagnosis not present

## 2022-04-16 DIAGNOSIS — E1122 Type 2 diabetes mellitus with diabetic chronic kidney disease: Secondary | ICD-10-CM | POA: Diagnosis not present

## 2022-04-16 DIAGNOSIS — E559 Vitamin D deficiency, unspecified: Secondary | ICD-10-CM | POA: Diagnosis not present

## 2022-04-16 DIAGNOSIS — R809 Proteinuria, unspecified: Secondary | ICD-10-CM | POA: Diagnosis not present

## 2022-04-16 DIAGNOSIS — E1129 Type 2 diabetes mellitus with other diabetic kidney complication: Secondary | ICD-10-CM | POA: Diagnosis not present

## 2022-05-09 DIAGNOSIS — E1165 Type 2 diabetes mellitus with hyperglycemia: Secondary | ICD-10-CM | POA: Diagnosis not present

## 2022-05-17 ENCOUNTER — Other Ambulatory Visit (HOSPITAL_COMMUNITY): Payer: Self-pay | Admitting: Hematology

## 2022-05-17 DIAGNOSIS — R69 Illness, unspecified: Secondary | ICD-10-CM | POA: Diagnosis not present

## 2022-05-17 DIAGNOSIS — C50911 Malignant neoplasm of unspecified site of right female breast: Secondary | ICD-10-CM

## 2022-05-18 DIAGNOSIS — R69 Illness, unspecified: Secondary | ICD-10-CM | POA: Diagnosis not present

## 2022-05-19 DIAGNOSIS — R69 Illness, unspecified: Secondary | ICD-10-CM | POA: Diagnosis not present

## 2022-06-09 DIAGNOSIS — E1165 Type 2 diabetes mellitus with hyperglycemia: Secondary | ICD-10-CM | POA: Diagnosis not present

## 2022-06-14 ENCOUNTER — Encounter: Payer: Self-pay | Admitting: Nurse Practitioner

## 2022-06-14 ENCOUNTER — Ambulatory Visit (INDEPENDENT_AMBULATORY_CARE_PROVIDER_SITE_OTHER): Payer: Medicare HMO | Admitting: Nurse Practitioner

## 2022-06-14 VITALS — BP 161/71 | HR 69 | Ht 60.0 in | Wt 202.0 lb

## 2022-06-14 DIAGNOSIS — N184 Chronic kidney disease, stage 4 (severe): Secondary | ICD-10-CM

## 2022-06-14 DIAGNOSIS — E1122 Type 2 diabetes mellitus with diabetic chronic kidney disease: Secondary | ICD-10-CM

## 2022-06-14 LAB — POCT GLYCOSYLATED HEMOGLOBIN (HGB A1C): Hemoglobin A1C: 8.1 % — AB (ref 4.0–5.6)

## 2022-06-14 NOTE — Progress Notes (Signed)
Endocrinology Follow Up Note       06/14/2022, 12:07 PM   Subjective:    Patient ID: Krista Payne, female    DOB: 1941-02-18.  Krista Payne is being seen in follow up after being seen in consultation for management of currently uncontrolled symptomatic diabetes requested by  Celene Squibb, MD.   Past Medical History:  Diagnosis Date   Breast cancer [facility])    R   Chronic kidney disease    Diabetes mellitus without complication ([place])    Hyperlipidemia    Hypertension    Macular degeneration    Osteoarthritis     Past Surgical History:  Procedure Laterality Date   CATARACT EXTRACTION W/PHACO Right 02/07/2015   Procedure: CATARACT EXTRACTION PHACO AND INTRAOCULAR LENS PLACEMENT ([place]);  Surgeon: Tonny Branch, MD;  Location: [facility];  Service: Ophthalmology;  Laterality: Right;  CDE:12.72   CATARACT EXTRACTION W/PHACO Left 02/24/2015   Procedure: CATARACT EXTRACTION PHACO AND INTRAOCULAR LENS PLACEMENT LEFT EYE;  Surgeon: Tonny Branch, MD;  Location: [facility];  Service: Ophthalmology;  Laterality: Left;  CDE 11.69   COLONOSCOPY      Social History   Socioeconomic History   Marital status: Widowed    Spouse name: Not on file   Number of children: 3   Years of education: Not on file   Highest education level: Not on file  Occupational History   Not on file  Tobacco Use   Smoking status: Never   Smokeless tobacco: Never  Vaping Use   Vaping Use: Never used  Substance and Sexual Activity   Alcohol use: No   Drug use: No   Sexual activity: Never  Other Topics Concern   Not on file  Social History Narrative   Not on file   Social Determinants of Health   Financial Resource Strain: Low Risk  (09/12/2020)   Overall Financial Resource Strain (CARDIA)    Difficulty of Paying Living Expenses: Not hard at all  Food Insecurity: No Food Insecurity (09/12/2020)   Hunger Vital Sign    Worried About Running Out of  Food in the Last Year: Never true    [place] in the Last Year: Never true  Transportation Needs: No Transportation Needs (09/12/2020)   PRAPARE - Hydrologist (Medical): No    Lack of Transportation (Non-Medical): No  Physical Activity: Inactive (09/12/2020)   Exercise Vital Sign    Days of Exercise per Week: 0 days    Minutes of Exercise per Session: 0 min  Stress: No Stress Concern Present (09/12/2020)   [place]    Feeling of Stress : Not at all  Social Connections: Moderately Integrated (09/12/2020)   Social Connection and Isolation Panel [NHANES]    Frequency of Communication with Friends and Family: More than three times a week    Frequency of Social Gatherings with Friends and Family: More than three times a week    Attends Religious Services: More than 4 times per year    Active Member of [employer] or Organizations: Yes    Attends Archivist Meetings: More than 4  times per year    Marital Status: Widowed    Family History  Problem Relation Age of Onset   Macular degeneration Mother    Diabetes Father    Heart disease Daughter     Outpatient Encounter Medications as of 06/14/2022  Medication Sig   amLODipine (NORVASC) 5 MG tablet Take 0.5 tablets (2.5 mg total) by mouth daily.   anastrozole (ARIMIDEX) 1 MG tablet Take 1 tablet (1 mg total) by mouth daily.   atenolol (TENORMIN) 25 MG tablet Take 25 mg by mouth daily.   buPROPion (WELLBUTRIN XL) 150 MG 24 hr tablet Take 300 mg by mouth daily as needed (feeling moody/depressed).   furosemide (LASIX) 20 MG tablet Take 10 mg by mouth daily as needed for fluid.   insulin glargine (LANTUS) 100 UNIT/ML injection Inject 0.12 mLs (12 Units total) into the skin at bedtime.   Insulin Pen Needle (PEN NEEDLES) 31G X 6 MM MISC Use to inject insulin once daily   olmesartan (BENICAR) 5 MG tablet Take 5 mg by mouth daily.    simvastatin (ZOCOR) 20 MG tablet Take 1 tablet (20 mg total) by mouth at bedtime.   [DISCONTINUED] levocetirizine (XYZAL) 5 MG tablet Take 1 tablet (5 mg total) by mouth daily as needed for allergies.   No facility-administered encounter medications on file as of 06/14/2022.    ALLERGIES: No Known Allergies  VACCINATION STATUS: Immunization History  Administered Date(s) Administered   Influenza Split 11/13/2011   Influenza Whole 07/20/2008, 08/10/2009, 10/18/2010   Influenza-Unspecified 07/29/2013, 08/12/2020   Pneumococcal Polysaccharide-23 07/20/2008   Td 07/29/1998, 08/10/2009    Diabetes She presents for her follow-up diabetic visit. She has type 2 diabetes mellitus. Her disease course has been improving. There are no hypoglycemic associated symptoms. Associated symptoms include fatigue and polyuria. Pertinent negatives for diabetes include no polydipsia and no weight loss. Hypoglycemia complications include nocturnal hypoglycemia. Symptoms are improving. Diabetic complications include nephropathy and peripheral neuropathy. Risk factors for coronary artery disease include diabetes mellitus, dyslipidemia, family history, hypertension, sedentary lifestyle, post-menopausal and obesity. Current diabetic treatment includes insulin injections. She is compliant with treatment most of the time. Her weight is fluctuating minimally. She is following a generally healthy diet. When asked about meal planning, she reported none (gets meals on wheels). She has not had a previous visit with a dietitian. She participates in exercise intermittently. Her home blood glucose trend is fluctuating minimally. Her overall blood glucose range is 180-200 mg/dl. (She presents today, accompanied by her daughter, with her CGM showing at goal fasting and slightly above target postprandial readings.  Her POCT A1c today is 8.1%, improving greatly from last visit of 13.2%.  Analysis of her CGM shows TIR 45%, TAR 55%, TBR 0%  with a GMI of 8.1%.  She does admit to eating junk food on occasion.) An ACE inhibitor/angiotensin II receptor blocker is being taken. She sees a podiatrist.Eye exam is current.  Hyperlipidemia This is a chronic problem. The current episode started more than 1 year ago. The problem is controlled. Recent lipid tests were reviewed and are variable. Exacerbating diseases include chronic renal disease, diabetes and obesity. Factors aggravating her hyperlipidemia include beta blockers and fatty foods. Current antihyperlipidemic treatment includes statins. The current treatment provides moderate improvement of lipids. Compliance problems include adherence to diet and adherence to exercise.  Risk factors for coronary artery disease include diabetes mellitus, dyslipidemia, family history, hypertension, obesity, post-menopausal and a sedentary lifestyle.  Hypertension This is a chronic problem. The current  episode started more than 1 year ago. The problem has been resolved since onset. The problem is controlled. There are no associated agents to hypertension. Risk factors for coronary artery disease include diabetes mellitus, dyslipidemia, family history, obesity and sedentary lifestyle. Past treatments include calcium channel blockers, angiotensin blockers and diuretics. There are no compliance problems.  Hypertensive end-organ damage includes kidney disease. Identifiable causes of hypertension include chronic renal disease.    Review of systems  Constitutional: + Minimally fluctuating body weight,  current Body mass index is 39.45 kg/m. , no fatigue, no subjective hyperthermia, no subjective hypothermia Eyes: no blurry vision, no xerophthalmia ENT: no sore throat, no nodules palpated in throat, no dysphagia/odynophagia, no hoarseness Cardiovascular: no chest pain, no shortness of breath, no palpitations, no leg swelling Respiratory: no cough, no shortness of breath Gastrointestinal: no  nausea/vomiting/diarrhea Musculoskeletal: no muscle/joint aches- reports no falls since last visit Skin: no rashes, no hyperemia Neurological: no tremors, no numbness, no tingling, no dizziness Psychiatric: no depression, no anxiety   Objective:     BP (!) 161/71   Pulse 69   Ht 5' (1.524 m)   Wt 202 lb (91.6 kg)   BMI 39.45 kg/m   Wt Readings from Last 3 Encounters:  06/14/22 202 lb (91.6 kg)  03/16/22 192 lb (87.1 kg)  02/23/22 200 lb 9.9 oz (91 kg)     BP Readings from Last 3 Encounters:  06/14/22 (!) 161/71  03/16/22 (!) 157/69  02/26/22 (!) 140/56      Physical Exam- Limited  Constitutional:  Body mass index is 39.45 kg/m. , not in acute distress, normal state of mind Eyes:  EOMI, no exophthalmos Neck: Supple Cardiovascular: RRR, no murmurs, rubs, or gallops, no edema Respiratory: Adequate breathing efforts, no crackles, rales, rhonchi, or wheezing Musculoskeletal: no gross deformities, strength intact in all four extremities, no gross restriction of joint movements, reports no falls since last visit Skin:  no rashes, no hyperemia Neurological: no tremor with outstretched hands   Diabetic Foot Exam - Simple   No data filed     CMP ( most recent) CMP     Component Value Date/Time   NA 136 02/26/2022 0402   NA 137 06/12/2021 0000   K 4.0 02/26/2022 0402   CL 107 02/26/2022 0402   CO2 24 02/26/2022 0402   GLUCOSE 122 (H) 02/26/2022 0402   BUN 22 02/26/2022 0402   BUN 41 (A) 01/05/2022 0000   CREATININE 1.31 (H) 02/26/2022 0402   CALCIUM 8.3 (L) 02/26/2022 0402   PROT 6.7 02/23/2022 0526   ALBUMIN 2.9 (L) 02/23/2022 0526   AST 19 02/23/2022 0526   ALT 12 02/23/2022 0526   ALKPHOS 63 02/23/2022 0526   BILITOT 0.6 02/23/2022 0526   GFRNONAA 41 (L) 02/26/2022 0402   GFRAA 43 (L) 06/01/2020 1259     Diabetic Labs (most recent): Lab Results  Component Value Date   HGBA1C 13.2 02/15/2022   HGBA1C 9.8 (H) 11/14/2021   HGBA1C 9.8 11/14/2021      Lipid Panel ( most recent) Lipid Panel     Component Value Date/Time   CHOL 173 06/12/2021 0000   TRIG 175 (A) 06/12/2021 0000   TRIG 175 (A) 06/12/2021 0000   HDL 51 06/12/2021 0000   CHOLHDL 3 02/20/2011 1051   VLDL 27.8 02/20/2011 1051   LDLCALC 92 06/12/2021 0000   LDLCALC 92 06/12/2021 0000   LDLDIRECT 168.8 10/19/2008 0914      Lab Results  Component Value Date  TSH 1.07 02/20/2011   TSH 1.70 10/18/2010   TSH 1.49 08/10/2009   TSH 1.23 07/20/2008   TSH 1.39 06/19/2007           Assessment & Plan:   1) Type 2 diabetes mellitus with stage 4 chronic kidney disease, without long-term current use of insulin ([place])  She presents today, accompanied by her daughter, with her CGM showing at goal fasting and slightly above target postprandial readings.  Her POCT A1c today is 8.1%, improving greatly from last visit of 13.2%.  Analysis of her CGM shows TIR 45%, TAR 55%, TBR 0% with a GMI of 8.1%.  She does admit to eating junk food on occasion.  - NICHOLE NEYER has currently uncontrolled symptomatic type 2 DM since 81 years of age.   -Recent labs reviewed.  - I had a long discussion with her about the progressive nature of diabetes and the pathology behind its complications. -her diabetes is complicated by CKD and she remains at a high risk for more acute and chronic complications which include CAD, CVA, CKD, retinopathy, and neuropathy. These are all discussed in detail with her.  - Nutritional counseling repeated at each appointment due to patients tendency to fall back in to old habits.  - The patient admits there is a room for improvement in their diet and drink choices. -  Suggestion is made for the patient to avoid simple carbohydrates from their diet including Cakes, Sweet Desserts / Pastries, Ice Cream, Soda (diet and regular), Sweet Tea, Candies, Chips, Cookies, Sweet Pastries, Store Bought Juices, Alcohol in Excess of 1-2 drinks a day, Artificial Sweeteners, Coffee  Creamer, and "Sugar-free" Products. This will help patient to have stable blood glucose profile and potentially avoid unintended weight gain.   - I encouraged the patient to switch to unprocessed or minimally processed complex starch and increased protein intake (animal or plant source), fruits, and vegetables.   - Patient is advised to stick to a routine mealtimes to eat 3 meals a day and avoid unnecessary snacks (to snack only to correct hypoglycemia).  - I have approached her with the following individualized plan to manage her diabetes and patient agrees:   -She is advised to continue Lantus 12 units SQ nightly.   -she is encouraged to continue monitoring glucose at least twice daily, before breakfast and before bed (using her CGM), and to call the clinic if she has readings less than 70 or above 300 for 3 tests in a row.     - she is warned not to take insulin without proper monitoring per orders. - Adjustment parameters are given to her for hypo and hyperglycemia in writing.  - her Glipizide was discontinued, risk outweighs benefit for this patient given her CKD and advanced age, this medication increases her risk of severe hypoglycemia. - she is not a candidate for Metformin due to concurrent renal insufficiency.  - she will be considered for incretin therapy as appropriate next visit.  - Specific targets for  A1c; LDL, HDL, and Triglycerides were discussed with the patient.  2) Blood Pressure /Hypertension:  her blood pressure is controlled to target.   she is advised to continue her current medications including Norvasc 5 mg p.o. daily with breakfast, Atenolol 25 mg po daily, Lasix 10 mg po daily, and Benicar 5 mg po daily.  3) Lipids/Hyperlipidemia:    Review of her recent lipid panel from 06/12/21 showed controlled LDL at 92 and elevated triglycerides of 175 .  she  is advised to continue Zocor 20 mg daily at bedtime.  Side effects and precautions discussed with her.  4)   Weight/Diet:  her Body mass index is 39.45 kg/m.  -  clearly complicating her diabetes care.   she is a candidate for weight loss. I discussed with her the fact that loss of 5 - 10% of her  current body weight will have the most impact on her diabetes management.  Exercise, and detailed carbohydrates information provided  -  detailed on discharge instructions.  5) Chronic Care/Health Maintenance: -she is on ACEI/ARB and Statin medications and is encouraged to initiate and continue to follow up with Ophthalmology, Dentist, Podiatrist at least yearly or according to recommendations, and advised to stay away from smoking. I have recommended yearly flu vaccine and pneumonia vaccine at least every 5 years; moderate intensity exercise for up to 150 minutes weekly; and sleep for at least 7 hours a day.  - she is advised to maintain close follow up with Celene Squibb, MD for primary care needs, as well as her other providers for optimal and coordinated care.     I spent 42 minutes in the care of the patient today including review of labs from [place], Lipids, Thyroid Function, Hematology (current and previous including abstractions from other facilities); face-to-face time discussing  her blood glucose readings/logs, discussing hypoglycemia and hyperglycemia episodes and symptoms, medications doses, her options of short and long term treatment based on the latest standards of care / guidelines;  discussion about incorporating lifestyle medicine;  and documenting the encounter. Risk reduction counseling performed per USPSTF guidelines to reduce  obesity and cardiovascular risk factors.     Please refer to Patient Instructions for Blood Glucose Monitoring and Insulin/Medications Dosing Guide"  in media tab for additional information. Please  also refer to " Patient Self Inventory" in the Media  tab for reviewed elements of pertinent patient history.  Larose Kells participated in the discussions, expressed  understanding, and voiced agreement with the above plans.  All questions were answered to her satisfaction. she is encouraged to contact clinic should she have any questions or concerns prior to her return visit.     Follow up plan: - Return in about 3 months (around 09/14/2022) for Diabetes F/U with A1c in office, No previsit labs, Bring meter and logs.   Rayetta Pigg, [facility] [address] [place], [place] [address] Phone: [phone] Fax: [phone]  06/14/2022, 12:07 PM

## 2022-06-21 DIAGNOSIS — H52229 Regular astigmatism, unspecified eye: Secondary | ICD-10-CM | POA: Diagnosis not present

## 2022-06-21 DIAGNOSIS — Z0100 Encounter for examination of eyes and vision without abnormal findings: Secondary | ICD-10-CM | POA: Diagnosis not present

## 2022-06-21 DIAGNOSIS — H5203 Hypermetropia, bilateral: Secondary | ICD-10-CM | POA: Diagnosis not present

## 2022-06-21 DIAGNOSIS — H524 Presbyopia: Secondary | ICD-10-CM | POA: Diagnosis not present

## 2022-06-21 DIAGNOSIS — I10 Essential (primary) hypertension: Secondary | ICD-10-CM | POA: Diagnosis not present

## 2022-06-21 DIAGNOSIS — E1121 Type 2 diabetes mellitus with diabetic nephropathy: Secondary | ICD-10-CM | POA: Diagnosis not present

## 2022-06-21 DIAGNOSIS — E782 Mixed hyperlipidemia: Secondary | ICD-10-CM | POA: Diagnosis not present

## 2022-06-22 LAB — BASIC METABOLIC PANEL
BUN: 39 — AB (ref 4–21)
CO2: 21 (ref 13–22)
Chloride: 101 (ref 99–108)
Creatinine: 1.6 — AB (ref 0.5–1.1)
Glucose: 185
Potassium: 5.7 mEq/L — AB (ref 3.5–5.1)
Sodium: 140 (ref 137–147)

## 2022-06-22 LAB — LIPID PANEL
Cholesterol: 183 (ref 0–200)
HDL: 52 (ref 35–70)
LDL Cholesterol: 102
Triglycerides: 166 — AB (ref 40–160)

## 2022-06-22 LAB — HEPATIC FUNCTION PANEL
ALT: 6 U/L — AB (ref 7–35)
AST: 13 (ref 13–35)
Alkaline Phosphatase: 74 (ref 25–125)
Bilirubin, Total: 0.5

## 2022-06-22 LAB — COMPREHENSIVE METABOLIC PANEL
Albumin: 3.9 (ref 3.5–5.0)
Calcium: 10.3 (ref 8.7–10.7)
Globulin: 3.1

## 2022-06-22 LAB — HEMOGLOBIN A1C: Hemoglobin A1C: 8.9

## 2022-06-25 DIAGNOSIS — E1122 Type 2 diabetes mellitus with diabetic chronic kidney disease: Secondary | ICD-10-CM | POA: Diagnosis not present

## 2022-06-25 DIAGNOSIS — Z6838 Body mass index (BMI) 38.0-38.9, adult: Secondary | ICD-10-CM | POA: Diagnosis not present

## 2022-06-25 DIAGNOSIS — I129 Hypertensive chronic kidney disease with stage 1 through stage 4 chronic kidney disease, or unspecified chronic kidney disease: Secondary | ICD-10-CM | POA: Diagnosis not present

## 2022-06-25 DIAGNOSIS — N189 Chronic kidney disease, unspecified: Secondary | ICD-10-CM | POA: Diagnosis not present

## 2022-06-25 DIAGNOSIS — E1129 Type 2 diabetes mellitus with other diabetic kidney complication: Secondary | ICD-10-CM | POA: Diagnosis not present

## 2022-06-25 DIAGNOSIS — E559 Vitamin D deficiency, unspecified: Secondary | ICD-10-CM | POA: Diagnosis not present

## 2022-06-25 DIAGNOSIS — R809 Proteinuria, unspecified: Secondary | ICD-10-CM | POA: Diagnosis not present

## 2022-06-25 DIAGNOSIS — D638 Anemia in other chronic diseases classified elsewhere: Secondary | ICD-10-CM | POA: Diagnosis not present

## 2022-06-25 DIAGNOSIS — N170 Acute kidney failure with tubular necrosis: Secondary | ICD-10-CM | POA: Diagnosis not present

## 2022-06-28 DIAGNOSIS — I129 Hypertensive chronic kidney disease with stage 1 through stage 4 chronic kidney disease, or unspecified chronic kidney disease: Secondary | ICD-10-CM | POA: Diagnosis not present

## 2022-06-28 DIAGNOSIS — Z0001 Encounter for general adult medical examination with abnormal findings: Secondary | ICD-10-CM | POA: Diagnosis not present

## 2022-06-28 DIAGNOSIS — R600 Localized edema: Secondary | ICD-10-CM | POA: Diagnosis not present

## 2022-06-28 DIAGNOSIS — L89151 Pressure ulcer of sacral region, stage 1: Secondary | ICD-10-CM | POA: Diagnosis not present

## 2022-06-28 DIAGNOSIS — I10 Essential (primary) hypertension: Secondary | ICD-10-CM | POA: Diagnosis not present

## 2022-06-28 DIAGNOSIS — E875 Hyperkalemia: Secondary | ICD-10-CM | POA: Diagnosis not present

## 2022-06-28 DIAGNOSIS — Z6838 Body mass index (BMI) 38.0-38.9, adult: Secondary | ICD-10-CM | POA: Diagnosis not present

## 2022-06-28 DIAGNOSIS — Z741 Need for assistance with personal care: Secondary | ICD-10-CM | POA: Diagnosis not present

## 2022-06-28 DIAGNOSIS — N189 Chronic kidney disease, unspecified: Secondary | ICD-10-CM | POA: Diagnosis not present

## 2022-06-28 DIAGNOSIS — N184 Chronic kidney disease, stage 4 (severe): Secondary | ICD-10-CM | POA: Diagnosis not present

## 2022-06-28 DIAGNOSIS — R296 Repeated falls: Secondary | ICD-10-CM | POA: Diagnosis not present

## 2022-06-28 DIAGNOSIS — E1122 Type 2 diabetes mellitus with diabetic chronic kidney disease: Secondary | ICD-10-CM | POA: Diagnosis not present

## 2022-06-28 DIAGNOSIS — R809 Proteinuria, unspecified: Secondary | ICD-10-CM | POA: Diagnosis not present

## 2022-06-28 DIAGNOSIS — E782 Mixed hyperlipidemia: Secondary | ICD-10-CM | POA: Diagnosis not present

## 2022-06-28 DIAGNOSIS — R69 Illness, unspecified: Secondary | ICD-10-CM | POA: Diagnosis not present

## 2022-06-28 DIAGNOSIS — L97909 Non-pressure chronic ulcer of unspecified part of unspecified lower leg with unspecified severity: Secondary | ICD-10-CM | POA: Diagnosis not present

## 2022-06-28 DIAGNOSIS — D638 Anemia in other chronic diseases classified elsewhere: Secondary | ICD-10-CM | POA: Diagnosis not present

## 2022-06-28 DIAGNOSIS — E1129 Type 2 diabetes mellitus with other diabetic kidney complication: Secondary | ICD-10-CM | POA: Diagnosis not present

## 2022-07-04 ENCOUNTER — Telehealth: Payer: Self-pay

## 2022-07-04 DIAGNOSIS — E1165 Type 2 diabetes mellitus with hyperglycemia: Secondary | ICD-10-CM

## 2022-07-04 DIAGNOSIS — N1832 Chronic kidney disease, stage 3b: Secondary | ICD-10-CM

## 2022-07-04 DIAGNOSIS — I10 Essential (primary) hypertension: Secondary | ICD-10-CM

## 2022-07-05 ENCOUNTER — Telehealth: Payer: Self-pay

## 2022-07-05 NOTE — Chronic Care Management (AMB) (Signed)
  Care Coordination  Outreach Note  07/05/2022 Name: Krista Payne MRN: [id number] DOB: [birth year]   Care Coordination Outreach Attempts:  Telephone Outreach today and discussed referral with daughter Krista Payne.   Follow Up Plan:  Additional outreach attempts will be made to offer the patient care coordination information and services.   Encounter Outcome:  Pt. Request to Call Back. Daughter states that she will speak with patient and call me back to schedule.  Noreene Larsson, [place], [place] [address] Direct Dial: [phone] [email or web address]

## 2022-07-06 ENCOUNTER — Encounter: Payer: Self-pay | Admitting: *Deleted

## 2022-07-10 DIAGNOSIS — E1165 Type 2 diabetes mellitus with hyperglycemia: Secondary | ICD-10-CM | POA: Diagnosis not present

## 2022-07-16 NOTE — Chronic Care Management (AMB) (Signed)
  Care Coordination   Note   07/16/2022 Name: Krista Payne MRN: [id number] DOB: [birth year]  [name] is a 81 y.o. year old female who sees Nevada Crane, Edwinna Areola, MD for primary care. I reached out to Larose Kells by phone today to offer care coordination services.  [name] was given information about Care Coordination services today including:   The Care Coordination services include support from the care team which includes your Nurse Coordinator, Clinical Social Worker, or Pharmacist.  The Care Coordination team is here to help remove barriers to the health concerns and goals most important to you. Care Coordination services are voluntary, and the patient may decline or stop services at any time by request to their care team member.   Care Coordination Consent Status: Patient agreed to services and verbal consent obtained.   Follow up plan:  Telephone appointment with care coordination team member scheduled for:  07/19/2022  Encounter Outcome:  Pt. Scheduled  Noreene Larsson, [place], [place] [address] Direct Dial: [phone] [email or web address]

## 2022-07-17 DIAGNOSIS — E782 Mixed hyperlipidemia: Secondary | ICD-10-CM | POA: Diagnosis not present

## 2022-07-17 DIAGNOSIS — H3530 Unspecified macular degeneration: Secondary | ICD-10-CM | POA: Diagnosis not present

## 2022-07-17 DIAGNOSIS — Z6834 Body mass index (BMI) 34.0-34.9, adult: Secondary | ICD-10-CM | POA: Diagnosis not present

## 2022-07-17 DIAGNOSIS — M8580 Other specified disorders of bone density and structure, unspecified site: Secondary | ICD-10-CM | POA: Diagnosis not present

## 2022-07-17 DIAGNOSIS — I5032 Chronic diastolic (congestive) heart failure: Secondary | ICD-10-CM | POA: Diagnosis not present

## 2022-07-17 DIAGNOSIS — D509 Iron deficiency anemia, unspecified: Secondary | ICD-10-CM | POA: Diagnosis not present

## 2022-07-17 DIAGNOSIS — M810 Age-related osteoporosis without current pathological fracture: Secondary | ICD-10-CM | POA: Diagnosis not present

## 2022-07-17 DIAGNOSIS — E875 Hyperkalemia: Secondary | ICD-10-CM | POA: Diagnosis not present

## 2022-07-17 DIAGNOSIS — Z794 Long term (current) use of insulin: Secondary | ICD-10-CM | POA: Diagnosis not present

## 2022-07-17 DIAGNOSIS — I87391 Chronic venous hypertension (idiopathic) with other complications of right lower extremity: Secondary | ICD-10-CM | POA: Diagnosis not present

## 2022-07-17 DIAGNOSIS — R69 Illness, unspecified: Secondary | ICD-10-CM | POA: Diagnosis not present

## 2022-07-17 DIAGNOSIS — R809 Proteinuria, unspecified: Secondary | ICD-10-CM | POA: Diagnosis not present

## 2022-07-17 DIAGNOSIS — N184 Chronic kidney disease, stage 4 (severe): Secondary | ICD-10-CM | POA: Diagnosis not present

## 2022-07-17 DIAGNOSIS — I130 Hypertensive heart and chronic kidney disease with heart failure and stage 1 through stage 4 chronic kidney disease, or unspecified chronic kidney disease: Secondary | ICD-10-CM | POA: Diagnosis not present

## 2022-07-17 DIAGNOSIS — E1122 Type 2 diabetes mellitus with diabetic chronic kidney disease: Secondary | ICD-10-CM | POA: Diagnosis not present

## 2022-07-17 DIAGNOSIS — R296 Repeated falls: Secondary | ICD-10-CM | POA: Diagnosis not present

## 2022-07-17 DIAGNOSIS — C50911 Malignant neoplasm of unspecified site of right female breast: Secondary | ICD-10-CM | POA: Diagnosis not present

## 2022-07-19 ENCOUNTER — Encounter: Payer: Self-pay | Admitting: *Deleted

## 2022-07-19 ENCOUNTER — Ambulatory Visit: Payer: Self-pay | Admitting: *Deleted

## 2022-07-21 NOTE — Patient Outreach (Signed)
  Care Coordination   Initial Visit Note   07/21/2022  Name: Krista Payne MRN: [id number] DOB: 11-21-1940  Krista Payne Console is a 81 y.o. year old female who sees Nevada Crane, Edwinna Areola, MD for primary care. I spoke with daughter, Bobbye Charleston by phone today.  What matters to the patients health and wellness today?  Receive Services and Resources for Seniors.    Goals Addressed             This Visit's Progress    Receive Services and Resources for Seniors.   On track    Care Coordination Interventions:  Assessed Social Determinant of Health Barriers. Discussed Plans with Daughter for Ongoing Care Management Follow Up. Provided Daughter with Tree surgeon Information for Care Management Team. Screened for Signs and Symptoms of Depression Related to Chronic Disease State.  CBJ6/EGB1 Depression Screen Completed & Results Reviewed with Daughter.  Suicidal Ideation/Homicidal Ideation Assessed - None Present. Solution-Focused Strategies Employed. Caregiver Stress Acknowledged. Active Listening/Reflection Utilized.  Emotional Support Provided. Verbalization of Feelings Encouraged.  Problem Solving/Task-Centered Solutions Developed.   Provided Psychoeducation for Mental Health Concerns. Quality of Sleep Assessed & Sleep Hygiene Techniques Promoted. Mailed the Following List of Express Scripts and Resources to Daughter for Review:  [place] [facility] [place] [facility] Resource Guide for [place]            SDOH assessments and interventions completed:  Yes.  SDOH Interventions Today    Flowsheet Row Most Recent Value  SDOH Interventions   Food Insecurity Interventions Intervention Not Indicated, Other (Comment)  [Verified by Daughter, [place] Interventions Intervention Not Indicated, Other (Comment)  [Verified by Daughter, [name]  Murrell]  Transportation Interventions Intervention Not Indicated, Other (Comment)  [Verified by Daughter, [name] Murrell]  Utilities Interventions Intervention Not Indicated  Alcohol Usage Interventions Intervention Not Indicated (Score <7), Other (Comment)  [Verified by Daughter, [name] Murrell]  Financial Strain Interventions Intervention Not Indicated, Other (Comment)  [Verified by Daughter, [name] Murrell]  Physical Activity Interventions Patient Refused, Other (Comments)  [Verified by Daughter, [name] Murrell]  Stress Interventions Intervention Not Indicated, Other (Comment)  [Verified by Daughter, [name] Murrell]  Social Connections Interventions Intervention Not Indicated, Other (Comment)  [Verified by Daughter, [name] Murrell]        Care Coordination Interventions Activated:  Yes.   Care Coordination Interventions:  Yes, provided.   Follow up plan: Follow up call scheduled for 07/30/2022 at 9:00 am.  Encounter Outcome:  Pt. Visit Completed.   Nat Christen, BSW, MSW, LCSW  Licensed Education officer, [facility]  Mailing [place] [address], [place], [place] [address] Physical Address-[address], [place], [place] [address] Toll Free Main # [phone] Fax # [phone] Cell # [phone] [email or web address]

## 2022-07-21 NOTE — Patient Instructions (Signed)
Visit Information  Thank you for taking time to visit with me today. Please don't hesitate to contact me if I can be of assistance to you.   Following are the goals we discussed today:   Goals Addressed             This Visit's Progress    Receive Services and Resources for Seniors.   On track    Care Coordination Interventions:  Assessed Social Determinant of Health Barriers. Discussed Plans with Daughter for Ongoing Care Management Follow Up. Provided Daughter with Tree surgeon Information for Care Management Team. Screened for Signs and Symptoms of Depression Related to Chronic Disease State.  WOE3/OZY2 Depression Screen Completed & Results Reviewed with Daughter.  Suicidal Ideation/Homicidal Ideation Assessed - None Present. Solution-Focused Strategies Employed. Caregiver Stress Acknowledged. Active Listening/Reflection Utilized.  Emotional Support Provided. Verbalization of Feelings Encouraged.  Problem Solving/Task-Centered Solutions Developed.   Provided Psychoeducation for Mental Health Concerns. Quality of Sleep Assessed & Sleep Hygiene Techniques Promoted. Mailed the Following List of Express Scripts and Resources to Daughter for Review:  [place] [facility] [place] [facility] Resource Guide for [place] next appointment is by telephone on 07/30/2022 at 9:00 am.  Please call the care guide team at [phone] if you need to cancel or reschedule your appointment.   If you are experiencing a Mental Health or [place] or need someone to talk to, please call the Suicide and Crisis Lifeline: 988 call the [place] National Suicide Prevention Lifeline: 514-182-5496 or TTY: 979-084-8354 TTY (713)792-2547) to talk to a trained counselor call 1-800-273-TALK (toll free, 24 hour hotline) go to [facility] [address] [place] [phone]) call the [place]: (231)572-3703 call 911  Patient verbalizes understanding of instructions and care plan provided today and agrees to view in [place]. Active MyChart status and patient understanding of how to access instructions and care plan via MyChart confirmed with patient.     Telephone follow up appointment with care management team member scheduled for:  07/30/2022 at 9:00 am.  Nat Christen, BSW, MSW, [place]  Licensed Clinical Social Worker  [place]  Mailing [place]. [address] [place], [place] [address] Physical Address[address], [place], [place] [address] Toll Free Main # [phone] Fax # [phone] Cell # [phone] [email or web address]

## 2022-07-30 ENCOUNTER — Ambulatory Visit: Payer: Self-pay | Admitting: *Deleted

## 2022-07-30 NOTE — Patient Outreach (Signed)
  Care Coordination   07/30/2022  Name: Krista Payne MRN: [id number] DOB: 02/13/1941   Care Coordination Outreach Attempts:  An unsuccessful telephone outreach was attempted today to offer the patient information about available care coordination services as a benefit of their health plan. HIPAA compliant message left on voicemail, providing contact information for CSW, encouraging patient, and/or daughter, to return CSW's call at their earliest convenience.  Follow Up Plan:  Additional outreach attempts will be made to offer the patient care coordination information and services.   Encounter Outcome:  No Answer.   Care Coordination Interventions Activated:  No.    Care Coordination Interventions:  No, not indicated.    Nat Christen, BSW, MSW, LCSW  Licensed Education officer, [facility]  Mailing [place] [address] [place], [place] [address] Physical Address-[address] [place], [place] [address] Toll Free Main # [phone] Fax # [phone] Cell # [phone] [email or web address]

## 2022-08-01 ENCOUNTER — Telehealth: Payer: Self-pay

## 2022-08-01 NOTE — Chronic Care Management (AMB) (Signed)
  Care Coordination Note  08/01/2022 Name: Krista Payne MRN: [id number] DOB: Nov 17, 1940  [name] is a 81 y.o. year old female who is a primary care patient of Celene Squibb, MD and is actively engaged with the care management team. I reached out to Larose Kells by phone today to assist with re-scheduling a follow up visit with the Licensed Clinical Social Worker  Follow up plan: Unsuccessful telephone outreach attempt made. A HIPAA compliant phone message was left for the patient providing contact information and requesting a return call.  The care management team will reach out to the patient again over the next 7 days.  If patient returns call to provider office, please advise to call [facility]  at [place] [place] [facility]  [place], [place] [address] Direct Dial: [phone] [email or web address]

## 2022-08-09 DIAGNOSIS — M79672 Pain in left foot: Secondary | ICD-10-CM | POA: Diagnosis not present

## 2022-08-09 DIAGNOSIS — M79671 Pain in right foot: Secondary | ICD-10-CM | POA: Diagnosis not present

## 2022-08-09 DIAGNOSIS — M779 Enthesopathy, unspecified: Secondary | ICD-10-CM | POA: Diagnosis not present

## 2022-08-09 DIAGNOSIS — I739 Peripheral vascular disease, unspecified: Secondary | ICD-10-CM | POA: Diagnosis not present

## 2022-08-09 DIAGNOSIS — M79675 Pain in left toe(s): Secondary | ICD-10-CM | POA: Diagnosis not present

## 2022-08-09 DIAGNOSIS — M79674 Pain in right toe(s): Secondary | ICD-10-CM | POA: Diagnosis not present

## 2022-08-09 DIAGNOSIS — E1165 Type 2 diabetes mellitus with hyperglycemia: Secondary | ICD-10-CM | POA: Diagnosis not present

## 2022-08-09 DIAGNOSIS — E1140 Type 2 diabetes mellitus with diabetic neuropathy, unspecified: Secondary | ICD-10-CM | POA: Diagnosis not present

## 2022-08-14 NOTE — Chronic Care Management (AMB) (Signed)
  Care Coordination Note  08/14/2022 Name: JEMIA FATA MRN: [id number] DOB: 1941-06-24  Krista Payne is a 81 y.o. year old female who is a primary care patient of Celene Squibb, MD and is actively engaged with the care management team. I reached out to Larose Kells by phone today to assist with re-scheduling a follow up visit with the Licensed Clinical Social Worker  Follow up plan: Patient's daughter [name] declines further follow up and engagement by the care management team. Appropriate care team members and provider have been notified via electronic communication.   Noreene Larsson, [place], [place] [address] Direct Dial: [phone] [email or web address]

## 2022-08-16 ENCOUNTER — Telehealth: Payer: Self-pay | Admitting: Nurse Practitioner

## 2022-08-16 NOTE — Telephone Encounter (Signed)
New message    The daughter  [name] calling about her mom last office visit 4/6 , 4/20, 5/19, 8/17   The daughter feels that her mom is not doing what she suppose to do and having a lot of insulin left over.   I read the last office notes regarding 8/17 -She is advised to continue Lantus 12 units SQ nightly.  Daughter voiced that she can do the math, does not want a nurse to call her back    Daughter voiced that all she has done be on the phone dealing with her mom medication.

## 2022-08-16 NOTE — Telephone Encounter (Signed)
noted 

## 2022-08-17 ENCOUNTER — Other Ambulatory Visit: Payer: Self-pay

## 2022-08-17 DIAGNOSIS — E559 Vitamin D deficiency, unspecified: Secondary | ICD-10-CM

## 2022-08-17 DIAGNOSIS — C50911 Malignant neoplasm of unspecified site of right female breast: Secondary | ICD-10-CM

## 2022-08-20 ENCOUNTER — Inpatient Hospital Stay: Payer: Medicare HMO | Attending: Hematology

## 2022-08-20 DIAGNOSIS — Z853 Personal history of malignant neoplasm of breast: Secondary | ICD-10-CM | POA: Insufficient documentation

## 2022-08-20 DIAGNOSIS — M8580 Other specified disorders of bone density and structure, unspecified site: Secondary | ICD-10-CM | POA: Diagnosis not present

## 2022-08-20 DIAGNOSIS — E559 Vitamin D deficiency, unspecified: Secondary | ICD-10-CM

## 2022-08-20 DIAGNOSIS — Z79811 Long term (current) use of aromatase inhibitors: Secondary | ICD-10-CM | POA: Insufficient documentation

## 2022-08-20 DIAGNOSIS — N189 Chronic kidney disease, unspecified: Secondary | ICD-10-CM | POA: Insufficient documentation

## 2022-08-20 DIAGNOSIS — C50911 Malignant neoplasm of unspecified site of right female breast: Secondary | ICD-10-CM

## 2022-08-20 LAB — COMPREHENSIVE METABOLIC PANEL
ALT: 11 U/L (ref 0–44)
AST: 15 U/L (ref 15–41)
Albumin: 3.6 g/dL (ref 3.5–5.0)
Alkaline Phosphatase: 65 U/L (ref 38–126)
Anion gap: 8 (ref 5–15)
BUN: 32 mg/dL — ABNORMAL HIGH (ref 8–23)
CO2: 22 mmol/L (ref 22–32)
Calcium: 9 mg/dL (ref 8.9–10.3)
Chloride: 103 mmol/L (ref 98–111)
Creatinine, Ser: 1.54 mg/dL — ABNORMAL HIGH (ref 0.44–1.00)
GFR, Estimated: 34 mL/min — ABNORMAL LOW (ref >60)
Glucose, Bld: 239 mg/dL — ABNORMAL HIGH (ref 70–99)
Potassium: 4.7 mmol/L (ref 3.5–5.1)
Sodium: 133 mmol/L — ABNORMAL LOW (ref 135–145)
Total Bilirubin: 0.8 mg/dL (ref 0.3–1.2)
Total Protein: 7.5 g/dL (ref 6.5–8.1)

## 2022-08-20 LAB — CBC WITH DIFFERENTIAL/PLATELET
Abs Immature Granulocytes: 0.03 10*3/uL (ref 0.00–0.07)
Basophils Absolute: 0.1 10*3/uL (ref 0.0–0.1)
Basophils Relative: 1 %
Eosinophils Absolute: 0.4 10*3/uL (ref 0.0–0.5)
Eosinophils Relative: 5 %
HCT: 33.4 % — ABNORMAL LOW (ref 36.0–46.0)
Hemoglobin: 10.7 g/dL — ABNORMAL LOW (ref 12.0–15.0)
Immature Granulocytes: 0 %
Lymphocytes Relative: 14 %
Lymphs Abs: 1.1 10*3/uL (ref 0.7–4.0)
MCH: 29.5 pg (ref 26.0–34.0)
MCHC: 32 g/dL (ref 30.0–36.0)
MCV: 92 fL (ref 80.0–100.0)
Monocytes Absolute: 0.7 10*3/uL (ref 0.1–1.0)
Monocytes Relative: 9 %
Neutro Abs: 5.7 10*3/uL (ref 1.7–7.7)
Neutrophils Relative %: 71 %
Platelets: 378 10*3/uL (ref 150–400)
RBC: 3.63 MIL/uL — ABNORMAL LOW (ref 3.87–5.11)
RDW: 13.5 % (ref 11.5–15.5)
WBC: 7.9 10*3/uL (ref 4.0–10.5)
nRBC: 0 % (ref 0.0–0.2)

## 2022-08-20 LAB — VITAMIN D 25 HYDROXY (VIT D DEFICIENCY, FRACTURES): Vit D, 25-Hydroxy: 29.42 ng/mL — ABNORMAL LOW (ref 30–100)

## 2022-08-22 DIAGNOSIS — E11621 Type 2 diabetes mellitus with foot ulcer: Secondary | ICD-10-CM | POA: Diagnosis not present

## 2022-08-22 DIAGNOSIS — S90812A Abrasion, left foot, initial encounter: Secondary | ICD-10-CM | POA: Diagnosis not present

## 2022-08-22 DIAGNOSIS — Z23 Encounter for immunization: Secondary | ICD-10-CM | POA: Diagnosis not present

## 2022-08-27 ENCOUNTER — Inpatient Hospital Stay (HOSPITAL_BASED_OUTPATIENT_CLINIC_OR_DEPARTMENT_OTHER): Payer: Medicare HMO | Admitting: Hematology

## 2022-08-27 ENCOUNTER — Other Ambulatory Visit (HOSPITAL_COMMUNITY): Payer: Self-pay | Admitting: Hematology

## 2022-08-27 ENCOUNTER — Encounter: Payer: Self-pay | Admitting: Hematology

## 2022-08-27 DIAGNOSIS — C50911 Malignant neoplasm of unspecified site of right female breast: Secondary | ICD-10-CM | POA: Diagnosis not present

## 2022-08-27 DIAGNOSIS — M8580 Other specified disorders of bone density and structure, unspecified site: Secondary | ICD-10-CM | POA: Diagnosis not present

## 2022-08-27 DIAGNOSIS — N189 Chronic kidney disease, unspecified: Secondary | ICD-10-CM | POA: Diagnosis not present

## 2022-08-27 DIAGNOSIS — Z853 Personal history of malignant neoplasm of breast: Secondary | ICD-10-CM | POA: Diagnosis not present

## 2022-08-27 DIAGNOSIS — Z79811 Long term (current) use of aromatase inhibitors: Secondary | ICD-10-CM | POA: Diagnosis not present

## 2022-08-27 MED ORDER — ANASTROZOLE 1 MG PO TABS: 1.0000 mg | ORAL_TABLET | Freq: Every day | ORAL | 3 refills | Status: DC

## 2022-08-27 NOTE — Progress Notes (Signed)
[facility] [address] [place] [address]   Patient Care Team: Celene Squibb, MD as PCP - General (Internal Medicine)  SUMMARY OF ONCOLOGIC HISTORY: Oncology History   No history exists.    CHIEF COMPLIANT: Follow-up for right breast cancer   INTERVAL HISTORY: [name] is a 81 y.o. female seen for follow-up of right breast cancer.  She was last seen in our clinic in July 2022.  She was lost to follow-up after that.  She ran out of anastrozole about 5 months ago and has not been taking.  Denies any new onset pains.    REVIEW OF SYSTEMS:   Review of Systems  Constitutional:  Negative for appetite change.  All other systems reviewed and are negative.   I have reviewed the past medical history, past surgical history, social history and family history with the patient and they are unchanged from previous note.   ALLERGIES:   has No Known Allergies.   MEDICATIONS:  Current Outpatient Medications  Medication Sig Dispense Refill   mupirocin ointment (BACTROBAN) 2 % SMARTSIG:sparingly Topical 3 Times Daily     amLODipine (NORVASC) 5 MG tablet Take 0.5 tablets (2.5 mg total) by mouth daily.     anastrozole (ARIMIDEX) 1 MG tablet Take 1 tablet (1 mg total) by mouth daily. 90 tablet 3   atenolol (TENORMIN) 25 MG tablet Take 25 mg by mouth daily.     buPROPion (WELLBUTRIN XL) 150 MG 24 hr tablet Take 300 mg by mouth daily as needed (feeling moody/depressed).     furosemide (LASIX) 20 MG tablet Take 10 mg by mouth daily as needed for fluid.     insulin glargine (LANTUS) 100 UNIT/ML injection Inject 0.12 mLs (12 Units total) into the skin at bedtime. 15 mL 3   Insulin Pen Needle (PEN NEEDLES) 31G X 6 MM MISC Use to inject insulin once daily 100 each 3   olmesartan (BENICAR) 5 MG tablet Take 5 mg by mouth daily.     simvastatin (ZOCOR) 20 MG tablet Take 1 tablet (20 mg total) by mouth at bedtime. 90 tablet 3   No current facility-administered  medications for this visit.     PHYSICAL EXAMINATION: Performance status (ECOG): 1 - Symptomatic but completely ambulatory  Vitals:   08/27/22 1207  BP: (!) 190/73  Pulse: 63  Resp: 18  Temp: 98 F (36.7 C)  SpO2: 98%   Wt Readings from Last 3 Encounters:  08/27/22 205 lb (93 kg)  06/14/22 202 lb (91.6 kg)  03/16/22 192 lb (87.1 kg)   Physical Exam Vitals reviewed.  Constitutional:      Appearance: Normal appearance. She is obese.  Cardiovascular:     Rate and Rhythm: Normal rate and regular rhythm.     Pulses: Normal pulses.     Heart sounds: Normal heart sounds.  Pulmonary:     Effort: Pulmonary effort is normal.     Breath sounds: Normal breath sounds.  Chest:  Breasts:    Right: No swelling, bleeding, inverted nipple, mass, nipple discharge, skin change or tenderness.     Left: No swelling, bleeding, inverted nipple, mass, nipple discharge, skin change or tenderness.  Musculoskeletal:     Right lower leg: Edema present.     Left lower leg: Edema present.  Lymphadenopathy:     Cervical: No cervical adenopathy.     Upper Body:     Right upper body: No supraclavicular, axillary or pectoral adenopathy.  Left upper body: No supraclavicular, axillary or pectoral adenopathy.  Skin:    Comments: Bandage above right ankle  Neurological:     General: No focal deficit present.     Mental Status: She is alert and oriented to person, place, and time.  Psychiatric:        Mood and Affect: Mood normal.        Behavior: Behavior normal.     Breast Exam Chaperone: Milinda Antis, MD     LABORATORY DATA:  I have reviewed the data as listed    Latest Ref Rng & Units 08/20/2022    2:32 PM 06/22/2022   12:00 AM 02/26/2022    4:02 AM  CMP  Glucose 70 - 99 mg/dL 239   122   BUN 8 - 23 mg/dL 32  39     22   Creatinine 0.44 - 1.00 mg/dL 1.54  1.6     1.31   Sodium 135 - 145 mmol/L 133  140     136   Potassium 3.5 - 5.1 mmol/L 4.7  5.7     4.0   Chloride 98 - 111  mmol/L 103  101     107   CO2 22 - 32 mmol/L _0 Calcium 8.9 - 10.3 mg/dL 9.0  10.3     8.3   Total Protein 6.5 - 8.1 g/dL 7.5     Total Bilirubin 0.3 - 1.2 mg/dL 0.8     Alkaline Phos 38 - 126 U/L 65  74       AST 15 - 41 U/L 15  13       ALT 0 - 44 U/L 11  6          This result is from an external source.    No results found for: "CAN153" Lab Results  Component Value Date   WBC 7.9 08/20/2022   HGB 10.7 (L) 08/20/2022   HCT 33.4 (L) 08/20/2022   MCV 92.0 08/20/2022   PLT 378 08/20/2022   NEUTROABS 5.7 08/20/2022   Lab Results  Component Value Date   VD25OH 29.42 (L) 08/20/2022   VD25OH 31.29 01/04/2021   VD25OH 30.23 09/05/2020    ASSESSMENT:  1.  Right breast invasive lobular carcinoma: -Stereotactic biopsy on 06/18/2019 showed ILC in the right breast lower inner quadrant.  90% positive for ER/PR.  Ki-67 was 5%.  Tumor negative for HER-2. -She refused surgical intervention. -MRI of the breast on 08/24/2019 showed biopsy-proven right breast invasive lobular carcinoma and atypical lobular hyperplasia.  No additional suspicious enhancements. -She was started on anastrozole on 08/24/2019. -MRI of the breast on 02/19/2020 showed treatment response with enhancement at the site of atypical lobular hyperplasia in the upper inner quadrant of the right breast measuring 0.9 x 0.5 cm (previously 1.3 x 0.9 cm).  Enhancement at the site of known invasive lobular carcinoma in the lower inner quadrant of the right breast has resolved completely.  Left breast remains negative.  No adenopathy. -MRI of the breast on 08/29/2020 shows non-mass enhancement involving the lower outer quadrant at anterior depth spanning 9 x 8 x 3 mm demonstrating progressive enhancement kinetics, new since prior MRI in April.  No mammographic correlate.  No abnormal enhancement at the clip biopsy site.  Left breast has no lesions.   2.  Osteopenia: -DEXA scan on 07/20/2019 shows T score of -1.9.   3.   CKD: -Creatinine is stable around  1.4.   PLAN:  1.  Right breast invasive lobular carcinoma: - She was lost to follow-up after her last visit in July 2022 in our clinic. - She reportedly ran out of anastrozole 5 months ago and has not been taking it. - Reviewed labs which showed normal LFTs.  CBC was grossly normal with hemoglobin 10.7. - Bilateral mammogram on 11/02/2021 shows vague density in the right breast measuring 5 mm. - We will start her back on anastrozole 1 mg tablet daily. - We will obtain mammogram in January and see her back in February with labs.   2.  Osteopenia: - Vitamin D is 29.  Continue calcium and D supplements.   3.  CKD: - This is stable with creatinine of 1.54.  Continue to follow-up with Dr. Theador Hawthorne.    Breast Cancer therapy associated bone loss: I have recommended calcium, Vitamin D and weight bearing exercises.   Orders Placed This Encounter  Procedures   MM DIAG BREAST TOMO BILATERAL    Standing Status:   Future    Standing Expiration Date:   08/27/2023    Order Specific Question:   Reason for Exam (SYMPTOM  OR DIAGNOSIS REQUIRED)    Answer:   breast cancer    Order Specific Question:   Preferred imaging location?    Answer:   [facility]   CBC with Differential    Standing Status:   Future    Standing Expiration Date:   08/27/2023   Magnesium    Standing Status:   Future    Standing Expiration Date:   08/27/2023   VITAMIN D 25 Hydroxy (Vit-D Deficiency, Fractures)    Standing Status:   Future    Standing Expiration Date:   08/28/2023   Comprehensive metabolic panel    Standing Status:   Future    Standing Expiration Date:   08/27/2023    The patient has a good understanding of the overall plan. she agrees with it. she will call with any problems that may develop before the next visit here.    Derek Jack, MD [place] [phone]   I, Milinda Antis, am acting as a scribe for Dr. Sanda Linger.  I, Derek Jack MD, have reviewed the above documentation for accuracy and completeness, and I agree with the above.

## 2022-08-31 DIAGNOSIS — S90812A Abrasion, left foot, initial encounter: Secondary | ICD-10-CM | POA: Diagnosis not present

## 2022-08-31 DIAGNOSIS — I129 Hypertensive chronic kidney disease with stage 1 through stage 4 chronic kidney disease, or unspecified chronic kidney disease: Secondary | ICD-10-CM | POA: Diagnosis not present

## 2022-08-31 DIAGNOSIS — E11621 Type 2 diabetes mellitus with foot ulcer: Secondary | ICD-10-CM | POA: Diagnosis not present

## 2022-08-31 DIAGNOSIS — D638 Anemia in other chronic diseases classified elsewhere: Secondary | ICD-10-CM | POA: Diagnosis not present

## 2022-08-31 DIAGNOSIS — R809 Proteinuria, unspecified: Secondary | ICD-10-CM | POA: Diagnosis not present

## 2022-08-31 DIAGNOSIS — E1122 Type 2 diabetes mellitus with diabetic chronic kidney disease: Secondary | ICD-10-CM | POA: Diagnosis not present

## 2022-08-31 DIAGNOSIS — N189 Chronic kidney disease, unspecified: Secondary | ICD-10-CM | POA: Diagnosis not present

## 2022-08-31 DIAGNOSIS — E1129 Type 2 diabetes mellitus with other diabetic kidney complication: Secondary | ICD-10-CM | POA: Diagnosis not present

## 2022-09-05 DIAGNOSIS — B338 Other specified viral diseases: Secondary | ICD-10-CM | POA: Diagnosis not present

## 2022-09-07 DIAGNOSIS — D638 Anemia in other chronic diseases classified elsewhere: Secondary | ICD-10-CM | POA: Diagnosis not present

## 2022-09-07 DIAGNOSIS — E1129 Type 2 diabetes mellitus with other diabetic kidney complication: Secondary | ICD-10-CM | POA: Diagnosis not present

## 2022-09-07 DIAGNOSIS — I129 Hypertensive chronic kidney disease with stage 1 through stage 4 chronic kidney disease, or unspecified chronic kidney disease: Secondary | ICD-10-CM | POA: Diagnosis not present

## 2022-09-07 DIAGNOSIS — N189 Chronic kidney disease, unspecified: Secondary | ICD-10-CM | POA: Diagnosis not present

## 2022-09-07 DIAGNOSIS — E1122 Type 2 diabetes mellitus with diabetic chronic kidney disease: Secondary | ICD-10-CM | POA: Diagnosis not present

## 2022-09-07 DIAGNOSIS — E875 Hyperkalemia: Secondary | ICD-10-CM | POA: Diagnosis not present

## 2022-09-07 DIAGNOSIS — R809 Proteinuria, unspecified: Secondary | ICD-10-CM | POA: Diagnosis not present

## 2022-09-09 DIAGNOSIS — E1165 Type 2 diabetes mellitus with hyperglycemia: Secondary | ICD-10-CM | POA: Diagnosis not present

## 2022-09-11 DIAGNOSIS — E875 Hyperkalemia: Secondary | ICD-10-CM | POA: Diagnosis not present

## 2022-09-11 DIAGNOSIS — I129 Hypertensive chronic kidney disease with stage 1 through stage 4 chronic kidney disease, or unspecified chronic kidney disease: Secondary | ICD-10-CM | POA: Diagnosis not present

## 2022-09-11 DIAGNOSIS — E1129 Type 2 diabetes mellitus with other diabetic kidney complication: Secondary | ICD-10-CM | POA: Diagnosis not present

## 2022-09-11 DIAGNOSIS — E1122 Type 2 diabetes mellitus with diabetic chronic kidney disease: Secondary | ICD-10-CM | POA: Diagnosis not present

## 2022-09-11 DIAGNOSIS — R809 Proteinuria, unspecified: Secondary | ICD-10-CM | POA: Diagnosis not present

## 2022-09-11 DIAGNOSIS — D638 Anemia in other chronic diseases classified elsewhere: Secondary | ICD-10-CM | POA: Diagnosis not present

## 2022-09-11 DIAGNOSIS — N189 Chronic kidney disease, unspecified: Secondary | ICD-10-CM | POA: Diagnosis not present

## 2022-09-14 DIAGNOSIS — S90812A Abrasion, left foot, initial encounter: Secondary | ICD-10-CM | POA: Diagnosis not present

## 2022-09-14 DIAGNOSIS — E11621 Type 2 diabetes mellitus with foot ulcer: Secondary | ICD-10-CM | POA: Diagnosis not present

## 2022-09-17 ENCOUNTER — Other Ambulatory Visit: Payer: Self-pay

## 2022-09-17 MED ORDER — INSULIN GLARGINE 100 UNIT/ML ~~LOC~~ SOLN: 12.0000 [IU] | Freq: Every day | SUBCUTANEOUS | 0 refills | Status: DC

## 2022-09-24 DIAGNOSIS — R600 Localized edema: Secondary | ICD-10-CM | POA: Diagnosis not present

## 2022-09-24 DIAGNOSIS — S90812A Abrasion, left foot, initial encounter: Secondary | ICD-10-CM | POA: Diagnosis not present

## 2022-09-24 DIAGNOSIS — E11621 Type 2 diabetes mellitus with foot ulcer: Secondary | ICD-10-CM | POA: Diagnosis not present

## 2022-09-27 ENCOUNTER — Telehealth: Payer: Self-pay | Admitting: Nurse Practitioner

## 2022-09-27 NOTE — Telephone Encounter (Signed)
Patient's daughter is asking for a refill on her sensors to [facility]. She has 7 left - but daughter is asking to go ahead and have this sent to [facility] to have on file. Dexcom G7.

## 2022-09-27 NOTE — Telephone Encounter (Signed)
I had sent her order through [employer] since she has Medicare, I will send them a message to refill though.

## 2022-09-28 ENCOUNTER — Other Ambulatory Visit: Payer: Self-pay | Admitting: Nurse Practitioner

## 2022-09-28 NOTE — Telephone Encounter (Signed)
Patient's daughter left a VM stating she does not need a new shipment, she needs an approval from [employer]. Will you call her back because and maybe clarify this for me because I talked to her yesterday and she made it sound like she needed a refill for the beginning of the year  Her name is [name] [phone]

## 2022-09-28 NOTE — Telephone Encounter (Signed)
I talked in person with [name] , patient's daughter. She explains that her mother does not need any sensors at this time. She is needing a PA to go through [employer] [facility] to get coverage of the remaining fee of the [place] sensors. Because they are considered durable medical , the patient will currently have to pay the 20% difference.  Patient has [place] - when she shared with Insurance agent , he suggested that she contact them , and this is where the [facility] came in. Daughter was told that they should pick up the remaining, leaving the patient zero balance.  The phone number that she gave was [phone].  patient has enough sensors - they are just needing the PA done , and a prescription be sent in to them to be put on file for January 2024. [name] was made aware that it may be next week ( first part ) before this may be addressed. She ask that we please call her or test her at [phone]

## 2022-10-02 NOTE — Telephone Encounter (Signed)
Sent to PA team 

## 2022-10-04 ENCOUNTER — Ambulatory Visit (HOSPITAL_COMMUNITY): Payer: Medicare HMO | Attending: Internal Medicine | Admitting: Physical Therapy

## 2022-10-04 ENCOUNTER — Encounter (HOSPITAL_COMMUNITY): Payer: Self-pay | Admitting: Physical Therapy

## 2022-10-04 DIAGNOSIS — R2689 Other abnormalities of gait and mobility: Secondary | ICD-10-CM | POA: Diagnosis not present

## 2022-10-04 DIAGNOSIS — E11621 Type 2 diabetes mellitus with foot ulcer: Secondary | ICD-10-CM | POA: Diagnosis not present

## 2022-10-04 DIAGNOSIS — L97519 Non-pressure chronic ulcer of other part of right foot with unspecified severity: Secondary | ICD-10-CM | POA: Diagnosis not present

## 2022-10-04 NOTE — Therapy (Signed)
OUTPATIENT PHYSICAL THERAPY Wound EVALUATION   Patient Name: Krista Payne MRN: [id number] DOB:03-09-1941, 81 y.o., female Today's Date: 10/04/2022   PCP: Celene Squibb, MD REFERRING PROVIDER: Celene Squibb, MD  END OF SESSION:  PT End of Session - 10/04/22 1327     Visit Number 1    Number of Visits 12    Date for PT Re-Evaluation 11/15/22    Authorization Type Primary: [employer] Medicare Secondary: [employer]are (no auth, no VL)    Progress Note Due on Visit 10    PT Start Time 1330    PT Stop Time 1406    PT Time Calculation (min) 36 min    Activity Tolerance Patient tolerated treatment well    Behavior During Therapy WFL for tasks assessed/performed             Past Medical History:  Diagnosis Date   Breast cancer ([facility] [facility])    R   Chronic kidney disease    Diabetes mellitus without complication ([facility])    Hyperlipidemia    Hypertension    Macular degeneration    Osteoarthritis    Past Surgical History:  Procedure Laterality Date   CATARACT EXTRACTION W/PHACO Right 02/07/2015   Procedure: CATARACT EXTRACTION PHACO AND INTRAOCULAR LENS PLACEMENT ([place]);  Surgeon: Tonny Branch, MD;  Location: [facility];  Service: Ophthalmology;  Laterality: Right;  CDE:12.72   CATARACT EXTRACTION W/PHACO Left 02/24/2015   Procedure: CATARACT EXTRACTION PHACO AND INTRAOCULAR LENS PLACEMENT LEFT EYE;  Surgeon: Tonny Branch, MD;  Location: [facility];  Service: Ophthalmology;  Laterality: Left;  CDE 11.69   COLONOSCOPY     Patient Active Problem List   Diagnosis Date Noted   Unspecified Escherichia coli (E. coli) as the cause of diseases classified elsewhere 02/23/2022   Sepsis ([facility]) 02/22/2022   Acute kidney failure ([facility] [facility]) 02/22/2022   Fall at home, initial encounter 02/22/2022   Personal history of COVID-19 02/02/2022   Pressure injury of skin 11/15/2021   Depression, unspecified 11/15/2021   Hypertensive heart and chronic kidney disease with heart failure and stage 1 through stage 4 chronic kidney  disease, or unspecified chronic kidney disease ([place]) 11/15/2021   Insomnia 11/15/2021   Muscle weakness (generalized) 11/15/2021   Other abnormalities of gait and mobility 11/15/2021   Other forms of acute ischemic heart disease 11/15/2021   Traumatic ischemia of muscle ([place]) 62/70/3500   Diastolic heart failure ([facility]) 11/15/2021   Diabetic foot infection ([facility]) 93/81/8299   Acute metabolic encephalopathy 37/16/9678   Chronic kidney disease, stage 3b ([place] [facility]) 11/14/2021   Type 2 diabetes with nephropathy ([place]) 11/14/2021   Elevated troponin 11/13/2021   Lactic acidosis 11/13/2021   COVID-19 virus infection 11/13/2021   Hyperglycemia due to diabetes mellitus ([place]) 11/13/2021   Ankle wound, right, initial encounter 11/13/2021   Altered mental status 11/13/2021   Dehydration 11/13/2021   Long term (current) use of oral hypoglycemic drugs 10/29/2021   Unspecified macular degeneration 10/29/2021   Chronic kidney disease 05/17/2021   Personal history of malignant neoplasm of breast 10/29/2020   Malignant neoplasm of unspecified site of right female breast ([facility]) 07/16/2019   URI (upper respiratory infection) 11/20/2011   Other seasonal allergic rhinitis 11/23/2010   Urinary tract infection without hematuria 11/23/2010   RENAL INSUFFICIENCY 10/26/2010   OSTEOARTHRITIS 10/18/2010   Osteoarthritis 10/18/2010   Vitamin D deficiency 08/10/2009   Hyperlipidemia 08/10/2009   Body mass index (BMI) 39.0-39.9, adult 08/10/2009   EDEMA 01/20/2008   Anemia in chronic kidney disease 06/19/2007  Hypertension 06/02/2007    ONSET DATE: 07/05/22  REFERRING DIAG: ulcer of right foot due to type 2 diabetes mellitus  THERAPY DIAG:  Ulcer of right foot due to type 2 diabetes mellitus ([place])  Other abnormalities of gait and mobility  Rationale for Evaluation and Treatment: Rehabilitation     Wound Therapy - 10/04/22 0001     Subjective Patient has had wound on her R leg/foot for last several months  with indisious onset. she is unaware of any vascular issues but does state she was told to wear compression garments by one of her doctors. She was given cream to put on wound but forgot it and wasnt sure what it is. Wound has started healing some.    Patient and Family Stated Goals wound to heal    Date of Onset 07/05/22    Prior Treatments cream    Pain Scale 0-10    Pain Score 0-No pain    Evaluation and Treatment Procedures Explained to Patient/Family Yes    Evaluation and Treatment Procedures agreed to    Wound Properties Date First Assessed: 10/04/22 Time First Assessed: 1345 Wound Type: Venous stasis ulcer Location: Pretibial Location Orientation: Distal;Right Wound Description (Comments): medial ankle wound superior Present on Admission: Yes   Wound Image Images linked: 1    Dressing Type None    Dressing Changed Changed    Dressing Status Old drainage    Dressing Change Frequency PRN    Site / Wound Assessment Red;Yellow;Pink    % Wound base Red or Granulating 75%    % Wound base Yellow/Fibrinous Exudate 25%    Peri-wound Assessment Edema;Erythema (blanchable)    Wound Length (cm) 2.6 cm    Wound Width (cm) 2.5 cm    Wound Surface Area (cm^2) 6.5 cm^2    Drainage Amount Moderate    Drainage Description Serous    Treatment Cleansed;Debridement (Selective)    Selective Debridement (non-excisional) - Location RLE    Selective Debridement (non-excisional) - Tools Used Forceps    Selective Debridement (non-excisional) - Tissue Removed dry skin, slough    Wound Therapy - Clinical Statement Patient arrives without dressing on wound, very dry skin, erythema throughout R ankle. Able to debride some dry skin and slough from wound bed and periwound. Center of wound bed appears to be healing but edges of punched out area with slough and weeping. Patient with small inferior area scabbing but appearing mostly healed tissue beneath. Dressed wound with medihoney as some slough remained and used  profore lite as patient with edema throughout RLE. Used ABD at ankle for shaping and netting to secure. Patient will benefit from PT to promote wound healing.    Wound Therapy - Functional Problem List bathing, dressing    Factors Delaying/Impairing Wound Healing Altered sensation;Diabetes Mellitus;Vascular compromise;Multiple medical problems    Hydrotherapy Plan Debridement;Dressing change;Electrical stimulation;Patient/family education;Pulsatile lavage with suction;Ultrasonic wound therapy '@35'$  KHz (+/- 3 KHz)    Wound Therapy - Frequency 2X / week    Wound Therapy - Current Recommendations PT    Wound Plan debride and dressing changes PRN    Dressing  medihoney 4x4, profore lite with ABD for shaping               PATIENT EDUCATION: Education details: Patient educated on exam findings, POC, scope of PT, HEP, and keeping dressing on unless soiled or panful. Person educated: Patient Education method: Explanation, Demonstration, and Handouts Education comprehension: verbalized understanding, returned demonstration, verbal cues required, and tactile cues  required   HOME EXERCISE PROGRAM: N/a   GOALS: Goals reviewed with patient? Yes  SHORT TERM GOALS: Target date: 10/25/2022    Wound to be free from slough to demonstrate healing.  Baseline: Goal status: INITIAL   LONG TERM GOALS: Target date: 11/15/2022    Patient wound to be healed to reduce risk of infection. Baseline:  Goal status: INITIAL  2.  Patient will state importance of compression garment use and be able to don to reduce risk of further wound development.  Baseline:  Goal status: INITIAL   ASSESSMENT:  CLINICAL IMPRESSION: See above   OBJECTIVE IMPAIRMENTS: Abnormal gait, decreased activity tolerance, decreased balance, decreased mobility, difficulty walking, impaired sensation, and improper body mechanics.   ACTIVITY LIMITATIONS: carrying, lifting, standing, squatting, stairs, transfers, locomotion  level, and caring for others  PARTICIPATION LIMITATIONS: meal prep, cleaning, laundry, shopping, community activity, and yard work  PERSONAL FACTORS: 3+ comorbidities: DM, CKD, HLD, HTN  are also affecting patient's functional outcome.   REHAB POTENTIAL: Good  CLINICAL DECISION MAKING: Evolving/moderate complexity  EVALUATION COMPLEXITY: Moderate  PLAN: PT FREQUENCY: 2x/week  PT DURATION: 6 weeks  PLANNED INTERVENTIONS: Therapeutic exercises, Therapeutic activity, Neuromuscular re-education, Balance training, Gait training, Patient/Family education, Joint manipulation, Joint mobilization, Stair training, Orthotic/Fit training, DME instructions, Aquatic Therapy, Dry Needling, Electrical stimulation, Spinal manipulation, Spinal mobilization, Cryotherapy, Moist heat, Compression bandaging, scar mobilization, Splintting, Taping, Traction, Ultrasound, Ionotophoresis '4mg'$ /ml Dexamethasone, and Manual therapy   PLAN FOR NEXT SESSION: debride and dressing changes PRN   Mearl Latin, PT 10/04/2022, 2:25 PM

## 2022-10-08 ENCOUNTER — Ambulatory Visit (HOSPITAL_COMMUNITY): Payer: Medicare HMO | Admitting: Physical Therapy

## 2022-10-09 DIAGNOSIS — E1165 Type 2 diabetes mellitus with hyperglycemia: Secondary | ICD-10-CM | POA: Diagnosis not present

## 2022-10-10 ENCOUNTER — Ambulatory Visit (HOSPITAL_COMMUNITY): Payer: Medicare HMO | Admitting: Physical Therapy

## 2022-10-10 DIAGNOSIS — R2689 Other abnormalities of gait and mobility: Secondary | ICD-10-CM | POA: Diagnosis not present

## 2022-10-10 DIAGNOSIS — L97519 Non-pressure chronic ulcer of other part of right foot with unspecified severity: Secondary | ICD-10-CM | POA: Diagnosis not present

## 2022-10-10 DIAGNOSIS — E11621 Type 2 diabetes mellitus with foot ulcer: Secondary | ICD-10-CM | POA: Diagnosis not present

## 2022-10-10 NOTE — Therapy (Signed)
OUTPATIENT PHYSICAL THERAPY Wound Treatment Patient Name: Krista Payne MRN: [id number] DOB:1941-03-06, 81 y.o., female Today's Date: 10/10/2022   PCP: Celene Squibb, MD REFERRING PROVIDER: Celene Squibb, MD  END OF SESSION:  PT End of Session - 10/10/22 1508    Visit Number 2    Number of Visits 12    Date for PT Re-Evaluation 11/15/22    Authorization Type Primary: A[employer]na Medicare Secondary: Tricare (no auth, no VL)    Progress Note Due on Visit 10    PT Start Time 1440    PT Stop Time 1508    PT Time Calculation (min) 28 min    Activity Tolerance Patient tolerated treatment well    Behavior During Therapy WFL for tasks assessed/performed             Past Medical History:  Diagnosis Date   Breast cancer ([place])    R   Chronic kidney disease    Diabetes mellitus without complication ([place])    Hyperlipidemia    Hypertension    Macular degeneration    Osteoarthritis    Past Surgical History:  Procedure Laterality Date   CATARACT EXTRACTION W/PHACO Right 02/07/2015   Procedure: CATARACT EXTRACTION PHACO AND INTRAOCULAR LENS PLACEMENT ([place]);  Surgeon: Tonny Branch, MD;  Location: [facility];  Service: Ophthalmology;  Laterality: Right;  CDE:12.72   CATARACT EXTRACTION W/PHACO Left 02/24/2015   Procedure: CATARACT EXTRACTION PHACO AND INTRAOCULAR LENS PLACEMENT LEFT EYE;  Surgeon: Tonny Branch, MD;  Location: [facility];  Service: Ophthalmology;  Laterality: Left;  CDE 11.69   COLONOSCOPY     Patient Active Problem List   Diagnosis Date Noted   Unspecified Escherichia coli (E. coli) as the cause of diseases classified elsewhere 02/23/2022   Sepsis ([place]) 02/22/2022   Acute kidney failure ([place]) 02/22/2022   Fall at home, initial encounter 02/22/2022   Personal history of COVID-19 02/02/2022   Pressure injury of skin 11/15/2021   Depression, unspecified 11/15/2021   Hypertensive heart and chronic kidney disease with heart failure and stage 1 through stage 4 chronic kidney disease, or  unspecified chronic kidney disease ([place]) 11/15/2021   Insomnia 11/15/2021   Muscle weakness (generalized) 11/15/2021   Other abnormalities of gait and mobility 11/15/2021   Other forms of acute ischemic heart disease 11/15/2021   Traumatic ischemia of muscle ([place]) 94/76/5465   Diastolic heart failure ([place]) 11/15/2021   Diabetic foot infection ([place]) 03/54/6568   Acute metabolic encephalopathy 12/75/1700   Chronic kidney disease, stage 3b ([place]) 11/14/2021   Type 2 diabetes with nephropathy ([place]) 11/14/2021   Elevated troponin 11/13/2021   Lactic acidosis 11/13/2021   COVID-19 virus infection 11/13/2021   Hyperglycemia due to diabetes mellitus ([place]) 11/13/2021   Ankle wound, right, initial encounter 11/13/2021   Altered mental status 11/13/2021   Dehydration 11/13/2021   Long term (current) use of oral hypoglycemic drugs 10/29/2021   Unspecified macular degeneration 10/29/2021   Chronic kidney disease 05/17/2021   Personal history of malignant neoplasm of breast 10/29/2020   Malignant neoplasm of unspecified site of right female breast ([place]) 07/16/2019   URI (upper respiratory infection) 11/20/2011   Other seasonal allergic rhinitis 11/23/2010   Urinary tract infection without hematuria 11/23/2010   RENAL INSUFFICIENCY 10/26/2010   OSTEOARTHRITIS 10/18/2010   Osteoarthritis 10/18/2010   Vitamin D deficiency 08/10/2009   Hyperlipidemia 08/10/2009   Body mass index (BMI) 39.0-39.9, adult 08/10/2009   EDEMA 01/20/2008   Anemia in chronic kidney disease 06/19/2007   Hypertension  06/02/2007    ONSET DATE: 07/05/22  REFERRING DIAG: ulcer of right foot due to type 2 diabetes mellitus  THERAPY DIAG:  Ulcer of right foot due to type 2 diabetes mellitus ([place])  Other abnormalities of gait and mobility  Rationale for Evaluation and Treatment: Rehabilitation     Wound Therapy - 10/10/22 0001     Subjective Patient has had wound on her R leg/foot for last several months with indisious  onset. she is unaware of any vascular issues but does state she was told to wear compression garments by one of her doctors. She was given cream to put on wound but forgot it and wasnt sure what it is. Wound has started healing some.    Patient and Family Stated Goals wound to heal    Date of Onset 07/05/22    Prior Treatments cream    Pain Scale 0-10    Pain Score 0-No pain    Evaluation and Treatment Procedures Explained to Patient/Family Yes    Evaluation and Treatment Procedures agreed to    Wound Properties Date First Assessed: 10/04/22 Time First Assessed: 1345 Wound Type: Venous stasis ulcer Location: Pretibial Location Orientation: Distal;Right Wound Description (Comments): medial ankle wound superior Present on Admission: Yes   Dressing Type Compression wrap    Dressing Changed Changed    Dressing Status Old drainage    Dressing Change Frequency PRN    Site / Wound Assessment Red;Yellow;Pink    % Wound base Red or Granulating 90%    % Wound base Yellow/Fibrinous Exudate 10%    Peri-wound Assessment Edema;Erythema (blanchable)    Wound Length (cm) 2.3 cm    Wound Width (cm) 2.2 cm    Wound Surface Area (cm^2) 5.06 cm^2    Drainage Amount Moderate    Drainage Description Odor - foul    Treatment Cleansed;Debridement (Selective)    Selective Debridement (non-excisional) - Location RLE    Selective Debridement (non-excisional) - Tools Used Forceps    Selective Debridement (non-excisional) - Tissue Removed dry skin, slough    Wound Therapy - Clinical Statement Pt states that she tolerated the dressing well.  Dressing has slid down, however, it has been a week since evaluation.  Significant drainage noted.  Therapist increased to Profore from profore lite due to pt tolerating treament well.   Wound with less slough and smaller measurements this treatment.    Wound Therapy - Functional Problem List bathing, dressing    Factors Delaying/Impairing Wound Healing Altered sensation;Diabetes  Mellitus;Vascular compromise;Multiple medical problems    Hydrotherapy Plan Debridement;Dressing change;Electrical stimulation;Patient/family education;Pulsatile lavage with suction;Ultrasonic wound therapy '@35'$  KHz (+/- 3 KHz)    Wound Therapy - Frequency 2X / week    Wound Therapy - Current Recommendations PT    Wound Plan debride and dressing changes PRN    Dressing  medihoney 4x4, profore  with ABD for shaping               PATIENT EDUCATION: Education details: Patient educated on exam findings, POC, scope of PT, HEP, and keeping dressing on unless soiled or panful. Person educated: Patient Education method: Explanation, Demonstration, and Handouts Education comprehension: verbalized understanding, returned demonstration, verbal cues required, and tactile cues required   HOME EXERCISE PROGRAM: N/a   GOALS: Goals reviewed with patient? Yes  SHORT TERM GOALS: Target date: 10/25/2022    Wound to be free from slough to demonstrate healing.  Baseline: Goal status: IN PROGRESS   LONG TERM GOALS: Target date: 11/15/2022  Patient wound to be healed to reduce risk of infection. Baseline:  Goal status: IN PROGRESS  2.  Patient will state importance of compression garment use and be able to don to reduce risk of further wound development.  Baseline:  Goal status: IN PROGRESS   ASSESSMENT:  CLINICAL IMPRESSION: See above   OBJECTIVE IMPAIRMENTS: Abnormal gait, decreased activity tolerance, decreased balance, decreased mobility, difficulty walking, impaired sensation, and improper body mechanics.   ACTIVITY LIMITATIONS: carrying, lifting, standing, squatting, stairs, transfers, locomotion level, and caring for others  PARTICIPATION LIMITATIONS: meal prep, cleaning, laundry, shopping, community activity, and yard work  PERSONAL FACTORS: 3+ comorbidities: DM, CKD, HLD, HTN  are also affecting patient's functional outcome.   REHAB POTENTIAL: Good  CLINICAL  DECISION MAKING: Evolving/moderate complexity  EVALUATION COMPLEXITY: Moderate  PLAN: PT FREQUENCY: 2x/week  PT DURATION: 6 weeks  PLANNED INTERVENTIONS: Therapeutic exercises, Therapeutic activity, Neuromuscular re-education, Balance training, Gait training, Patient/Family education, Joint manipulation, Joint mobilization, Stair training, Orthotic/Fit training, DME instructions, Aquatic Therapy, Dry Needling, Electrical stimulation, Spinal manipulation, Spinal mobilization, Cryotherapy, Moist heat, Compression bandaging, scar mobilization, Splintting, Taping, Traction, Ultrasound, Ionotophoresis '4mg'$ /ml Dexamethasone, and Manual therapy   PLAN FOR NEXT SESSION: debride and dressing changes PRN   Rayetta Humphrey, PT CLT [phone]  10/10/2022, 3:07 PM

## 2022-10-16 ENCOUNTER — Ambulatory Visit (HOSPITAL_COMMUNITY): Payer: Medicare HMO | Admitting: Physical Therapy

## 2022-10-17 ENCOUNTER — Ambulatory Visit (HOSPITAL_COMMUNITY): Payer: Medicare HMO | Admitting: Physical Therapy

## 2022-10-18 ENCOUNTER — Ambulatory Visit (HOSPITAL_COMMUNITY): Payer: Medicare HMO | Admitting: Physical Therapy

## 2022-10-18 DIAGNOSIS — R2689 Other abnormalities of gait and mobility: Secondary | ICD-10-CM | POA: Diagnosis not present

## 2022-10-18 DIAGNOSIS — E11621 Type 2 diabetes mellitus with foot ulcer: Secondary | ICD-10-CM | POA: Diagnosis not present

## 2022-10-18 DIAGNOSIS — L97519 Non-pressure chronic ulcer of other part of right foot with unspecified severity: Secondary | ICD-10-CM | POA: Diagnosis not present

## 2022-10-18 NOTE — Therapy (Signed)
OUTPATIENT PHYSICAL THERAPY Wound Treatment Patient Name: Krista Payne MRN: [id number] DOB:[birth year], 81 y.o., female Today's Date: 10/18/2022   PCP: Celene Squibb, MD REFERRING PROVIDER: Celene Squibb, MD   END OF SESSION:   PT End of Session - 10/18/22 1601     Visit Number 3    Number of Visits 12    Date for PT Re-Evaluation 11/15/22    Authorization Type Primary: Aetna Medicare Secondary: Tricare (no auth, no VL)    Progress Note Due on Visit 10    PT Start Time 1518    PT Stop Time 1558    PT Time Calculation (min) 40 min    Activity Tolerance Patient tolerated treatment well    Behavior During Therapy WFL for tasks assessed/performed                  Past Medical History:  Diagnosis Date   Breast cancer ([place])    R   Chronic kidney disease    Diabetes mellitus without complication ([place])    Hyperlipidemia    Hypertension    Macular degeneration    Osteoarthritis    Past Surgical History:  Procedure Laterality Date   CATARACT EXTRACTION W/PHACO Right 02/07/2015   Procedure: CATARACT EXTRACTION PHACO AND INTRAOCULAR LENS PLACEMENT ([place]);  Surgeon: Tonny Branch, MD;  Location: [facility];  Service: Ophthalmology;  Laterality: Right;  CDE:12.72   CATARACT EXTRACTION W/PHACO Left 02/24/2015   Procedure: CATARACT EXTRACTION PHACO AND INTRAOCULAR LENS PLACEMENT LEFT EYE;  Surgeon: Tonny Branch, MD;  Location: [facility];  Service: Ophthalmology;  Laterality: Left;  CDE 11.69   COLONOSCOPY     Patient Active Problem List   Diagnosis Date Noted   Unspecified Escherichia coli (E. coli) as the cause of diseases classified elsewhere 02/23/2022   Sepsis ([place]) 02/22/2022   Acute kidney failure ([place]) 02/22/2022   Fall at home, initial encounter 02/22/2022   Personal history of COVID-19 02/02/2022   Pressure injury of skin 11/15/2021   Depression, unspecified 11/15/2021   Hypertensive heart and chronic kidney disease with heart failure and stage 1 through stage 4 chronic kidney  disease, or unspecified chronic kidney disease ([place]) 11/15/2021   Insomnia 11/15/2021   Muscle weakness (generalized) 11/15/2021   Other abnormalities of gait and mobility 11/15/2021   Other forms of acute ischemic heart disease 11/15/2021   Traumatic ischemia of muscle ([place]) 93/81/0175   Diastolic heart failure ([place]) 11/15/2021   Diabetic foot infection ([place]) 08/22/8526   Acute metabolic encephalopathy 78/24/2353   Chronic kidney disease, stage 3b ([place]) 11/14/2021   Type 2 diabetes with nephropathy ([place]) 11/14/2021   Elevated troponin 11/13/2021   Lactic acidosis 11/13/2021   COVID-19 virus infection 11/13/2021   Hyperglycemia due to diabetes mellitus ([place]) 11/13/2021   Ankle wound, right, initial encounter 11/13/2021   Altered mental status 11/13/2021   Dehydration 11/13/2021   Long term (current) use of oral hypoglycemic drugs 10/29/2021   Unspecified macular degeneration 10/29/2021   Chronic kidney disease 05/17/2021   Personal history of malignant neoplasm of breast 10/29/2020   Malignant neoplasm of unspecified site of right female breast ([place]) 07/16/2019   URI (upper respiratory infection) 11/20/2011   Other seasonal allergic rhinitis 11/23/2010   Urinary tract infection without hematuria 11/23/2010   RENAL INSUFFICIENCY 10/26/2010   OSTEOARTHRITIS 10/18/2010   Osteoarthritis 10/18/2010   Vitamin D deficiency 08/10/2009   Hyperlipidemia 08/10/2009   Body mass index (BMI) 39.0-39.9, adult 08/10/2009   EDEMA 01/20/2008   Anemia  in chronic kidney disease 06/19/2007   Hypertension 06/02/2007    ONSET DATE: 07/05/22  REFERRING DIAG: ulcer of right foot due to type 2 diabetes mellitus  THERAPY DIAG:  Ulcer of right foot due to type 2 diabetes mellitus ([place])  Other abnormalities of gait and mobility  Rationale for Evaluation and Treatment: Rehabilitation     Wound Therapy - 10/18/22 1602     Subjective pt reports she was able to keep her bandage on and thinks it's  getting better.    Patient and Family Stated Goals wound to heal    Date of Onset 07/05/22    Prior Treatments cream    Pain Scale 0-10    Pain Score 0-No pain    Evaluation and Treatment Procedures Explained to Patient/Family Yes    Evaluation and Treatment Procedures agreed to    Wound Properties Date First Assessed: 10/04/22 Time First Assessed: 1345 Wound Type: Venous stasis ulcer Location: Pretibial Location Orientation: Distal;Right Wound Description (Comments): medial ankle wound superior Present on Admission: Yes   Wound Image Images linked: 1    Dressing Type Compression wrap    Dressing Changed Changed    Dressing Status Old drainage    Dressing Change Frequency PRN    Site / Wound Assessment Red;Yellow;Pink    % Wound base Red or Granulating 90%    % Wound base Other/Granulation Tissue (Comment) 10%    Peri-wound Assessment Edema;Erythema (blanchable)    Wound Length (cm) 2 cm    Wound Width (cm) 2 cm    Wound Surface Area (cm^2) 4 cm^2    Drainage Amount Moderate    Drainage Description Odor - foul    Treatment Cleansed;Debridement (Selective)    Selective Debridement (non-excisional) - Location RLE    Selective Debridement (non-excisional) - Tools Used Forceps    Selective Debridement (non-excisional) - Tissue Removed dry skin, slough    Wound Therapy - Clinical Statement noted reduction of volume, wound size continues to reduce.  Still with irritation/redness perimeter and weeping causing odor from stagnant drainage.  Cleansed well and debrided dry tissue.  Perimeter of wound debrided to promote further approximation.  Moisturized LE well with lotion and then vaseline.  Silver hydrofiber applied perimeter and medihoney gel to wound bed.  Continued with profore to LE with additional roll of cotton used to improve shape of LE.  Pt reported overall comfort following.    Wound Therapy - Functional Problem List bathing, dressing    Factors Delaying/Impairing Wound Healing  Altered sensation;Diabetes Mellitus;Vascular compromise;Multiple medical problems    Hydrotherapy Plan Debridement;Dressing change;Electrical stimulation;Patient/family education;Pulsatile lavage with suction;Ultrasonic wound therapy '@35'$  KHz (+/- 3 KHz)    Wound Therapy - Frequency 2X / week    Wound Therapy - Current Recommendations PT    Wound Plan debride and dressing changes PRN    Dressing  medihoney 4x4, profore  with ABD for shaping                PATIENT EDUCATION: Education details: Patient educated on exam findings, POC, scope of PT, HEP, and keeping dressing on unless soiled or panful. Person educated: Patient Education method: Explanation, Demonstration, and Handouts Education comprehension: verbalized understanding, returned demonstration, verbal cues required, and tactile cues required   HOME EXERCISE PROGRAM: N/a   GOALS: Goals reviewed with patient? Yes  SHORT TERM GOALS: Target date: 10/25/2022    Wound to be free from slough to demonstrate healing.  Baseline: Goal status: IN PROGRESS   LONG TERM GOALS:  Target date: 11/15/2022    Patient wound to be healed to reduce risk of infection. Baseline:  Goal status: IN PROGRESS  2.  Patient will state importance of compression garment use and be able to don to reduce risk of further wound development.  Baseline:  Goal status: IN PROGRESS   ASSESSMENT:  CLINICAL IMPRESSION: See above   OBJECTIVE IMPAIRMENTS: Abnormal gait, decreased activity tolerance, decreased balance, decreased mobility, difficulty walking, impaired sensation, and improper body mechanics.   ACTIVITY LIMITATIONS: carrying, lifting, standing, squatting, stairs, transfers, locomotion level, and caring for others  PARTICIPATION LIMITATIONS: meal prep, cleaning, laundry, shopping, community activity, and yard work  PERSONAL FACTORS: 3+ comorbidities: DM, CKD, HLD, HTN  are also affecting patient's functional outcome.   REHAB  POTENTIAL: Good  CLINICAL DECISION MAKING: Evolving/moderate complexity  EVALUATION COMPLEXITY: Moderate  PLAN: PT FREQUENCY: 2x/week  PT DURATION: 6 weeks  PLANNED INTERVENTIONS: Therapeutic exercises, Therapeutic activity, Neuromuscular re-education, Balance training, Gait training, Patient/Family education, Joint manipulation, Joint mobilization, Stair training, Orthotic/Fit training, DME instructions, Aquatic Therapy, Dry Needling, Electrical stimulation, Spinal manipulation, Spinal mobilization, Cryotherapy, Moist heat, Compression bandaging, scar mobilization, Splintting, Taping, Traction, Ultrasound, Ionotophoresis '4mg'$ /ml Dexamethasone, and Manual therapy   PLAN FOR NEXT SESSION: debride and dressing changes PRN  Teena Irani, PTA/CLT [place] Ph: [phone]  10/18/2022, 4:07 PM

## 2022-10-24 ENCOUNTER — Ambulatory Visit (HOSPITAL_COMMUNITY): Payer: Medicare HMO | Admitting: Physical Therapy

## 2022-10-24 DIAGNOSIS — R2689 Other abnormalities of gait and mobility: Secondary | ICD-10-CM

## 2022-10-24 DIAGNOSIS — E11621 Type 2 diabetes mellitus with foot ulcer: Secondary | ICD-10-CM

## 2022-10-24 DIAGNOSIS — L97519 Non-pressure chronic ulcer of other part of right foot with unspecified severity: Secondary | ICD-10-CM | POA: Diagnosis not present

## 2022-10-24 DIAGNOSIS — N184 Chronic kidney disease, stage 4 (severe): Secondary | ICD-10-CM | POA: Diagnosis not present

## 2022-10-24 NOTE — Therapy (Signed)
OUTPATIENT PHYSICAL THERAPY Wound Treatment Patient Name: Krista Payne MRN: [id number] DOB:24-Oct-1941, 81 y.o., female Today's Date: 10/24/2022   PCP: Celene Squibb, MD REFERRING PROVIDER: Celene Squibb, MD   END OF SESSION:   PT End of Session - 10/24/22 1343    Visit Number 4    Number of Visits 12    Date for PT Re-Evaluation 11/15/22    Authorization Type Primary: Aetna Medicare Secondary: Tricare (no auth, no VL)    Progress Note Due on Visit 10    PT Start Time 1300    PT Stop Time 1330    PT Time Calculation (min) 30 min    Activity Tolerance Patient tolerated treatment well    Behavior During Therapy WFL for tasks assessed/performed                  Past Medical History:  Diagnosis Date   Breast cancer ([place])    R   Chronic kidney disease    Diabetes mellitus without complication ([place])    Hyperlipidemia    Hypertension    Macular degeneration    Osteoarthritis    Past Surgical History:  Procedure Laterality Date   CATARACT EXTRACTION W/PHACO Right 02/07/2015   Procedure: CATARACT EXTRACTION PHACO AND INTRAOCULAR LENS PLACEMENT ([place]);  Surgeon: Tonny Branch, MD;  Location: [facility];  Service: Ophthalmology;  Laterality: Right;  CDE:12.72   CATARACT EXTRACTION W/PHACO Left 02/24/2015   Procedure: CATARACT EXTRACTION PHACO AND INTRAOCULAR LENS PLACEMENT LEFT EYE;  Surgeon: Tonny Branch, MD;  Location: [facility];  Service: Ophthalmology;  Laterality: Left;  CDE 11.69   COLONOSCOPY     Patient Active Problem List   Diagnosis Date Noted   Unspecified Escherichia coli (E. coli) as the cause of diseases classified elsewhere 02/23/2022   Sepsis ([place]) 02/22/2022   Acute kidney failure ([place]) 02/22/2022   Fall at home, initial encounter 02/22/2022   Personal history of COVID-19 02/02/2022   Pressure injury of skin 11/15/2021   Depression, unspecified 11/15/2021   Hypertensive heart and chronic kidney disease with heart failure and stage 1 through stage 4 chronic kidney  disease, or unspecified chronic kidney disease ([place]) 11/15/2021   Insomnia 11/15/2021   Muscle weakness (generalized) 11/15/2021   Other abnormalities of gait and mobility 11/15/2021   Other forms of acute ischemic heart disease 11/15/2021   Traumatic ischemia of muscle ([place]) 23/76/2831   Diastolic heart failure ([place]) 11/15/2021   Diabetic foot infection ([place]) 51/76/1607   Acute metabolic encephalopathy 37/07/6268   Chronic kidney disease, stage 3b ([place]) 11/14/2021   Type 2 diabetes with nephropathy ([place]) 11/14/2021   Elevated troponin 11/13/2021   Lactic acidosis 11/13/2021   COVID-19 virus infection 11/13/2021   Hyperglycemia due to diabetes mellitus ([place]) 11/13/2021   Ankle wound, right, initial encounter 11/13/2021   Altered mental status 11/13/2021   Dehydration 11/13/2021   Long term (current) use of oral hypoglycemic drugs 10/29/2021   Unspecified macular degeneration 10/29/2021   Chronic kidney disease 05/17/2021   Personal history of malignant neoplasm of breast 10/29/2020   Malignant neoplasm of unspecified site of right female breast ([place]) 07/16/2019   URI (upper respiratory infection) 11/20/2011   Other seasonal allergic rhinitis 11/23/2010   Urinary tract infection without hematuria 11/23/2010   RENAL INSUFFICIENCY 10/26/2010   OSTEOARTHRITIS 10/18/2010   Osteoarthritis 10/18/2010   Vitamin D deficiency 08/10/2009   Hyperlipidemia 08/10/2009   Body mass index (BMI) 39.0-39.9, adult 08/10/2009   EDEMA 01/20/2008   Anemia in  chronic kidney disease 06/19/2007   Hypertension 06/02/2007    ONSET DATE: 07/05/22  REFERRING DIAG: ulcer of right foot due to type 2 diabetes mellitus  THERAPY DIAG:  Ulcer of right foot due to type 2 diabetes mellitus ([place])  Other abnormalities of gait and mobility  Rationale for Evaluation and Treatment: Rehabilitation     Wound Therapy - 10/24/22 0001     Subjective Pt has no complaints she has no pain at this time    Patient and  Family Stated Goals wound to heal    Date of Onset 07/05/22    Prior Treatments cream    Pain Scale 0-10    Pain Score 0-No pain    Evaluation and Treatment Procedures Explained to Patient/Family Yes    Evaluation and Treatment Procedures agreed to    Wound Properties Date First Assessed: 10/04/22 Time First Assessed: 1345 Wound Type: Venous stasis ulcer Location: Pretibial Location Orientation: Distal;Right Wound Description (Comments): medial ankle wound superior Present on Admission: Yes   Dressing Type Compression wrap    Dressing Changed Changed    Dressing Status Old drainage    Dressing Change Frequency PRN    Site / Wound Assessment Yellow;Pink    % Wound base Red or Granulating 85%    % Wound base Yellow/Fibrinous Exudate 15%    Peri-wound Assessment Erythema (non-blanchable)    Wound Length (cm) 2.5 cm    Wound Width (cm) 1.8 cm    Wound Surface Area (cm^2) 4.5 cm^2    Drainage Amount Minimal    Treatment Cleansed;Debridement (Selective)    Selective Debridement (non-excisional) - Location Rt  LE  wound base    Selective Debridement (non-excisional) - Tools Used Scalpel;Forceps    Selective Debridement (non-excisional) - Tissue Removed slough and dry skin    Wound Therapy - Clinical Statement PT LE is crusty and very dry.  The entire distal medial LE is red with no warmth.  The area of the wound has a depth of approximately .2 cm and is mainly healed with several small openings in it.  Due to extreme dryness and increased redness therapist changed dressing to vaseline to entire distal 1/2 of LE followed by xeroform, kerlix and netting to hold the dressing.    Wound Therapy - Functional Problem List bathing, dressing    Factors Delaying/Impairing Wound Healing Altered sensation;Diabetes Mellitus;Vascular compromise;Multiple medical problems    Hydrotherapy Plan Debridement;Dressing change;Patient/family education    Wound Therapy - Frequency 2X / week    Wound Therapy - Current  Recommendations PT    Wound Plan assess if LE is less red with the abscense of compression.  Pt may have mixed venous/arterial deficit.    Dressing  vaseline, xeroform, kerlix and netting.                 PATIENT EDUCATION: Education details: Patient educated on exam findings, POC, scope of PT, HEP, and keeping dressing on unless soiled or panful. Person educated: Patient Education method: Explanation, Demonstration, and Handouts Education comprehension: verbalized understanding, returned demonstration, verbal cues required, and tactile cues required   HOME EXERCISE PROGRAM: N/a   GOALS: Goals reviewed with patient? Yes  SHORT TERM GOALS: Target date: 10/25/2022    Wound to be free from slough to demonstrate healing.  Baseline: Goal status: IN PROGRESS   LONG TERM GOALS: Target date: 11/15/2022    Patient wound to be healed to reduce risk of infection. Baseline:  Goal status: IN PROGRESS  2.  Patient  will state importance of compression garment use and be able to don to reduce risk of further wound development.  Baseline:  Goal status: IN PROGRESS   ASSESSMENT:  CLINICAL IMPRESSION: See above   OBJECTIVE IMPAIRMENTS: Abnormal gait, decreased activity tolerance, decreased balance, decreased mobility, difficulty walking, impaired sensation, and improper body mechanics.   ACTIVITY LIMITATIONS: carrying, lifting, standing, squatting, stairs, transfers, locomotion level, and caring for others  PARTICIPATION LIMITATIONS: meal prep, cleaning, laundry, shopping, community activity, and yard work  PERSONAL FACTORS: 3+ comorbidities: DM, CKD, HLD, HTN  are also affecting patient's functional outcome.   REHAB POTENTIAL: Good  CLINICAL DECISION MAKING: Evolving/moderate complexity  EVALUATION COMPLEXITY: Moderate  PLAN: PT FREQUENCY: 2x/week  PT DURATION: 6 weeks  PLANNED INTERVENTIONS: Therapeutic exercises, Therapeutic activity, Neuromuscular  re-education, Balance training, Gait training, Patient/Family education, Joint manipulation, Joint mobilization, Stair training, Orthotic/Fit training, DME instructions, Aquatic Therapy, Dry Needling, Electrical stimulation, Spinal manipulation, Spinal mobilization, Cryotherapy, Moist heat, Compression bandaging, scar mobilization, Splintting, Taping, Traction, Ultrasound, Ionotophoresis '4mg'$ /ml Dexamethasone, and Manual therapy   PLAN FOR NEXT SESSION: debride and dressing changes PRN  .Rayetta Humphrey, PT CLT [phone]  10/24/2022, 1:42 PM

## 2022-10-25 DIAGNOSIS — H5203 Hypermetropia, bilateral: Secondary | ICD-10-CM | POA: Diagnosis not present

## 2022-10-26 ENCOUNTER — Encounter (HOSPITAL_COMMUNITY): Payer: Self-pay

## 2022-10-26 ENCOUNTER — Ambulatory Visit (HOSPITAL_COMMUNITY): Payer: Medicare HMO

## 2022-10-26 DIAGNOSIS — R2689 Other abnormalities of gait and mobility: Secondary | ICD-10-CM | POA: Diagnosis not present

## 2022-10-26 DIAGNOSIS — E11621 Type 2 diabetes mellitus with foot ulcer: Secondary | ICD-10-CM

## 2022-10-26 DIAGNOSIS — L97519 Non-pressure chronic ulcer of other part of right foot with unspecified severity: Secondary | ICD-10-CM | POA: Diagnosis not present

## 2022-10-26 NOTE — Therapy (Signed)
OUTPATIENT PHYSICAL THERAPY Wound Treatment Patient Name: Krista Payne MRN: [id number] DOB:03-Dec-1940, 81 y.o., female Today's Date: 10/26/2022   PCP: Celene Squibb, MD REFERRING PROVIDER: Celene Squibb, MD  END OF SESSION:   PT End of Session - 10/26/22 1551     Visit Number 5    Number of Visits 12    Date for PT Re-Evaluation 11/15/22    Authorization Type Primary: Aetna Medicare Secondary: Tricare (no auth, no VL)    Progress Note Due on Visit 10    PT Start Time 1505    PT Stop Time 1540    PT Time Calculation (min) 35 min    Activity Tolerance Patient tolerated treatment well    Behavior During Therapy WFL for tasks assessed/performed             Past Medical History:  Diagnosis Date   Breast cancer ([place])    R   Chronic kidney disease    Diabetes mellitus without complication ([place])    Hyperlipidemia    Hypertension    Macular degeneration    Osteoarthritis    Past Surgical History:  Procedure Laterality Date   CATARACT EXTRACTION W/PHACO Right 02/07/2015   Procedure: CATARACT EXTRACTION PHACO AND INTRAOCULAR LENS PLACEMENT ([place]);  Surgeon: Tonny Branch, MD;  Location: [facility];  Service: Ophthalmology;  Laterality: Right;  CDE:12.72   CATARACT EXTRACTION W/PHACO Left 02/24/2015   Procedure: CATARACT EXTRACTION PHACO AND INTRAOCULAR LENS PLACEMENT LEFT EYE;  Surgeon: Tonny Branch, MD;  Location: [facility];  Service: Ophthalmology;  Laterality: Left;  CDE 11.69   COLONOSCOPY     Patient Active Problem List   Diagnosis Date Noted   Unspecified Escherichia coli (E. coli) as the cause of diseases classified elsewhere 02/23/2022   Sepsis ([place]) 02/22/2022   Acute kidney failure ([place]) 02/22/2022   Fall at home, initial encounter 02/22/2022   Personal history of COVID-19 02/02/2022   Pressure injury of skin 11/15/2021   Depression, unspecified 11/15/2021   Hypertensive heart and chronic kidney disease with heart failure and stage 1 through stage 4 chronic kidney disease,  or unspecified chronic kidney disease ([place]) 11/15/2021   Insomnia 11/15/2021   Muscle weakness (generalized) 11/15/2021   Other abnormalities of gait and mobility 11/15/2021   Other forms of acute ischemic heart disease 11/15/2021   Traumatic ischemia of muscle ([place]) 29/52/8413   Diastolic heart failure ([place]) 11/15/2021   Diabetic foot infection ([place]) 24/40/1027   Acute metabolic encephalopathy 25/36/6440   Chronic kidney disease, stage 3b ([place]) 11/14/2021   Type 2 diabetes with nephropathy ([place]) 11/14/2021   Elevated troponin 11/13/2021   Lactic acidosis 11/13/2021   COVID-19 virus infection 11/13/2021   Hyperglycemia due to diabetes mellitus ([place]) 11/13/2021   Ankle wound, right, initial encounter 11/13/2021   Altered mental status 11/13/2021   Dehydration 11/13/2021   Long term (current) use of oral hypoglycemic drugs 10/29/2021   Unspecified macular degeneration 10/29/2021   Chronic kidney disease 05/17/2021   Personal history of malignant neoplasm of breast 10/29/2020   Malignant neoplasm of unspecified site of right female breast ([place]) 07/16/2019   URI (upper respiratory infection) 11/20/2011   Other seasonal allergic rhinitis 11/23/2010   Urinary tract infection without hematuria 11/23/2010   RENAL INSUFFICIENCY 10/26/2010   OSTEOARTHRITIS 10/18/2010   Osteoarthritis 10/18/2010   Vitamin D deficiency 08/10/2009   Hyperlipidemia 08/10/2009   Body mass index (BMI) 39.0-39.9, adult 08/10/2009   EDEMA 01/20/2008   Anemia in chronic kidney disease 06/19/2007  Hypertension 06/02/2007    ONSET DATE: 07/05/22  REFERRING DIAG: ulcer of right foot due to type 2 diabetes mellitus  THERAPY DIAG:  Ulcer of right foot due to type 2 diabetes mellitus ([place])  Other abnormalities of gait and mobility  Rationale for Evaluation and Treatment: Rehabilitation     Wound Therapy - 10/26/22 0001     Subjective Pt arrived with dressings intact, no reports of pain.  Reports comfort with  dressings    Patient and Family Stated Goals wound to heal    Date of Onset 07/05/22    Prior Treatments cream    Pain Scale 0-10    Pain Score 0-No pain    Evaluation and Treatment Procedures Explained to Patient/Family Yes    Evaluation and Treatment Procedures agreed to    Wound Properties Date First Assessed: 10/04/22 Time First Assessed: 1345 Wound Type: Venous stasis ulcer Location: Pretibial Location Orientation: Distal;Right Wound Description (Comments): medial ankle wound superior Present on Admission: Yes   Wound Image Images linked: 1    Dressing Type Impregnated gauze (bismuth);Gauze (Comment)   vaseline, xeroform, kerlix, netting #5   Dressing Changed Changed    Dressing Status Old drainage    Dressing Change Frequency PRN    Site / Wound Assessment Yellow;Pink    % Wound base Red or Granulating 85%    % Wound base Yellow/Fibrinous Exudate 15%    Peri-wound Assessment Erythema (non-blanchable)    Drainage Amount Minimal    Drainage Description Serous;No odor    Treatment Cleansed;Debridement (Selective)    Selective Debridement (non-excisional) - Location Rt  LE  wound base    Selective Debridement (non-excisional) - Tools Used Scalpel;Forceps    Selective Debridement (non-excisional) - Tissue Removed slough and dry skin    Wound Therapy - Clinical Statement LE is dry and continues to have redness present distal extremity, no heat or and minimal drainage noted.  Pt reports some reduction in redness, therapist's first time working with pt and hard to assess though do feel some reduction in redness based upon pictures only.  Selective debridment for removal of dry skin and slough from wound bed.  Continued with xeroform to address dryskin and heavy layer of vaseline.  Reports of comfort at EOS.    Wound Therapy - Functional Problem List bathing, dressing    Factors Delaying/Impairing Wound Healing Altered sensation;Diabetes Mellitus;Vascular compromise;Multiple medical problems     Hydrotherapy Plan Debridement;Dressing change;Patient/family education    Wound Therapy - Frequency 2X / week    Wound Therapy - Current Recommendations PT    Wound Plan assess if LE is less red with the abscense of compression.  Pt may have mixed venous/arterial deficit.    Dressing  vaseline, xeroform, kerlix and netting.                 PATIENT EDUCATION: Education details: Patient educated on exam findings, POC, scope of PT, HEP, and keeping dressing on unless soiled or panful. Person educated: Patient Education method: Explanation, Demonstration, and Handouts Education comprehension: verbalized understanding, returned demonstration, verbal cues required, and tactile cues required   HOME EXERCISE PROGRAM: N/a   GOALS: Goals reviewed with patient? Yes  SHORT TERM GOALS: Target date: 10/25/2022    Wound to be free from slough to demonstrate healing.  Baseline: Goal status: IN PROGRESS   LONG TERM GOALS: Target date: 11/15/2022    Patient wound to be healed to reduce risk of infection. Baseline:  Goal status: IN PROGRESS  2.  Patient will state importance of compression garment use and be able to don to reduce risk of further wound development.  Baseline:  Goal status: IN PROGRESS   ASSESSMENT:  CLINICAL IMPRESSION: See above   OBJECTIVE IMPAIRMENTS: Abnormal gait, decreased activity tolerance, decreased balance, decreased mobility, difficulty walking, impaired sensation, and improper body mechanics.   ACTIVITY LIMITATIONS: carrying, lifting, standing, squatting, stairs, transfers, locomotion level, and caring for others  PARTICIPATION LIMITATIONS: meal prep, cleaning, laundry, shopping, community activity, and yard work  PERSONAL FACTORS: 3+ comorbidities: DM, CKD, HLD, HTN  are also affecting patient's functional outcome.   REHAB POTENTIAL: Good  CLINICAL DECISION MAKING: Evolving/moderate complexity  EVALUATION COMPLEXITY:  Moderate  PLAN: PT FREQUENCY: 2x/week  PT DURATION: 6 weeks  PLANNED INTERVENTIONS: Therapeutic exercises, Therapeutic activity, Neuromuscular re-education, Balance training, Gait training, Patient/Family education, Joint manipulation, Joint mobilization, Stair training, Orthotic/Fit training, DME instructions, Aquatic Therapy, Dry Needling, Electrical stimulation, Spinal manipulation, Spinal mobilization, Cryotherapy, Moist heat, Compression bandaging, scar mobilization, Splintting, Taping, Traction, Ultrasound, Ionotophoresis '4mg'$ /ml Dexamethasone, and Manual therapy   PLAN FOR NEXT SESSION: debride and dressing changes PRN  Ihor Austin, LPTA/CLT; Delana Meyer [phone]  10/26/2022, 3:52 PM

## 2022-10-30 ENCOUNTER — Encounter (HOSPITAL_COMMUNITY): Payer: Self-pay

## 2022-10-30 ENCOUNTER — Ambulatory Visit (HOSPITAL_COMMUNITY): Payer: Medicare HMO | Attending: Internal Medicine

## 2022-10-30 DIAGNOSIS — N184 Chronic kidney disease, stage 4 (severe): Secondary | ICD-10-CM | POA: Diagnosis not present

## 2022-10-30 DIAGNOSIS — E11621 Type 2 diabetes mellitus with foot ulcer: Secondary | ICD-10-CM | POA: Diagnosis not present

## 2022-10-30 DIAGNOSIS — E1122 Type 2 diabetes mellitus with diabetic chronic kidney disease: Secondary | ICD-10-CM | POA: Diagnosis not present

## 2022-10-30 DIAGNOSIS — C50911 Malignant neoplasm of unspecified site of right female breast: Secondary | ICD-10-CM | POA: Diagnosis not present

## 2022-10-30 DIAGNOSIS — Z0000 Encounter for general adult medical examination without abnormal findings: Secondary | ICD-10-CM | POA: Diagnosis not present

## 2022-10-30 DIAGNOSIS — R600 Localized edema: Secondary | ICD-10-CM | POA: Diagnosis not present

## 2022-10-30 DIAGNOSIS — L97519 Non-pressure chronic ulcer of other part of right foot with unspecified severity: Secondary | ICD-10-CM | POA: Insufficient documentation

## 2022-10-30 DIAGNOSIS — I129 Hypertensive chronic kidney disease with stage 1 through stage 4 chronic kidney disease, or unspecified chronic kidney disease: Secondary | ICD-10-CM | POA: Diagnosis not present

## 2022-10-30 DIAGNOSIS — R2689 Other abnormalities of gait and mobility: Secondary | ICD-10-CM | POA: Diagnosis not present

## 2022-10-30 DIAGNOSIS — E782 Mixed hyperlipidemia: Secondary | ICD-10-CM | POA: Diagnosis not present

## 2022-10-30 DIAGNOSIS — I10 Essential (primary) hypertension: Secondary | ICD-10-CM | POA: Diagnosis not present

## 2022-10-30 DIAGNOSIS — D509 Iron deficiency anemia, unspecified: Secondary | ICD-10-CM | POA: Diagnosis not present

## 2022-10-30 DIAGNOSIS — M8580 Other specified disorders of bone density and structure, unspecified site: Secondary | ICD-10-CM | POA: Diagnosis not present

## 2022-10-30 DIAGNOSIS — E875 Hyperkalemia: Secondary | ICD-10-CM | POA: Diagnosis not present

## 2022-10-30 NOTE — Therapy (Signed)
[facility] [place], [place], [address] Phone: [phone]   Fax:  [phone]  Wound Care Therapy  Patient Details  Name: Krista Payne MRN: [id number] Date of Birth: [birth year] No data recorded  Encounter Date: 10/30/2022   PT End of Session - 10/30/22 1624     Visit Number 6    Number of Visits 12    Date for PT Re-Evaluation 11/15/22    Authorization Type Primary: Aetna Medicare Secondary: Tricare (no auth, no VL)    Progress Note Due on Visit 10    PT Start Time 1435    PT Stop Time 1510    PT Time Calculation (min) 35 min    Activity Tolerance Patient tolerated treatment well    Behavior During Therapy [facility] for tasks assessed/performed             Past Medical History:  Diagnosis Date   Breast cancer ([place])    R   Chronic kidney disease    Diabetes mellitus without complication ([place])    Hyperlipidemia    Hypertension    Macular degeneration    Osteoarthritis     Past Surgical History:  Procedure Laterality Date   CATARACT EXTRACTION W/PHACO Right 02/07/2015   Procedure: CATARACT EXTRACTION PHACO AND INTRAOCULAR LENS PLACEMENT ([place]);  Surgeon: Tonny Branch, MD;  Location: [facility];  Service: Ophthalmology;  Laterality: Right;  CDE:12.72   CATARACT EXTRACTION W/PHACO Left 02/24/2015   Procedure: CATARACT EXTRACTION PHACO AND INTRAOCULAR LENS PLACEMENT LEFT EYE;  Surgeon: Tonny Branch, MD;  Location: [facility];  Service: Ophthalmology;  Laterality: Left;  CDE 11.69   COLONOSCOPY      There were no vitals filed for this visit.               Wound Therapy - 10/30/22 0001     Subjective Pt arrived with dressings intact, reports of comfort.    Patient and Family Stated Goals wound to heal    Date of Onset 07/05/22    Prior Treatments cream    Pain Scale 0-10    Pain Score 0-No pain    Evaluation and Treatment Procedures Explained to Patient/Family Yes    Evaluation and Treatment Procedures agreed to     Wound Properties Date First Assessed: 10/04/22 Time First Assessed: 1345 Wound Type: Venous stasis ulcer Location: Pretibial Location Orientation: Distal;Right Wound Description (Comments): medial ankle wound superior Present on Admission: Yes   Wound Image Images linked: 1    Dressing Type Impregnated gauze (bismuth);Gauze (Comment)   vaseline, lymph netting #7, xeroform, extra cotton, kerlix, netting #5   Dressing Changed Changed    Dressing Status Old drainage    Dressing Change Frequency PRN    Site / Wound Assessment Yellow;Pink    % Wound base Red or Granulating 85%    % Wound base Yellow/Fibrinous Exudate 10%    % Wound base Black/Eschar 5%   raw skin   Peri-wound Assessment Erythema (non-blanchable)    Drainage Amount Minimal    Drainage Description Serous;No odor    Treatment Cleansed;Debridement (Selective)    Selective Debridement (non-excisional) - Location Rt  LE  wound base    Selective Debridement (non-excisional) - Tools Used Scalpel;Forceps    Selective Debridement (non-excisional) - Tissue Removed slough and dry skin    Wound Therapy - Clinical Statement Decreased redness noted upon dressing removal, increased redness following debridement for removal of dry and devitalized tissue to promote healing.  Leg  with increased bottle necking, added cotton and increased height of dressing to address edema present on top of dressing.  Continued wiht xeroform, vaseline and kerlix with netting.    Wound Therapy - Functional Problem List bathing, dressing    Factors Delaying/Impairing Wound Healing Altered sensation;Diabetes Mellitus;Vascular compromise;Multiple medical problems    Hydrotherapy Plan Debridement;Dressing change;Patient/family education    Wound Therapy - Frequency 2X / week    Wound Therapy - Current Recommendations PT    Wound Plan assess if LE is less red with the abscense of compression.  Pt may have mixed venous/arterial deficit.    Dressing  vaseline, lymph  netting #7, xeroform, extra cotton, kerlix, netting #5                                  Patient will benefit from skilled therapeutic intervention in order to improve the following deficits and impairments:     Visit Diagnosis: Ulcer of right foot due to type 2 diabetes mellitus ([place])  Other abnormalities of gait and mobility     Problem List Patient Active Problem List   Diagnosis Date Noted   Unspecified Escherichia coli (E. coli) as the cause of diseases classified elsewhere 02/23/2022   Sepsis ([place]) 02/22/2022   Acute kidney failure ([place]) 02/22/2022   Fall at home, initial encounter 02/22/2022   Personal history of COVID-19 02/02/2022   Pressure injury of skin 11/15/2021   Depression, unspecified 11/15/2021   Hypertensive heart and chronic kidney disease with heart failure and stage 1 through stage 4 chronic kidney disease, or unspecified chronic kidney disease ([place]) 11/15/2021   Insomnia 11/15/2021   Muscle weakness (generalized) 11/15/2021   Other abnormalities of gait and mobility 11/15/2021   Other forms of acute ischemic heart disease 11/15/2021   Traumatic ischemia of muscle ([place]) 16/07/9603   Diastolic heart failure ([place]) 11/15/2021   Diabetic foot infection ([place]) 54/06/8118   Acute metabolic encephalopathy 14/78/2956   Chronic kidney disease, stage 3b ([place]) 11/14/2021   Type 2 diabetes with nephropathy ([place]) 11/14/2021   Elevated troponin 11/13/2021   Lactic acidosis 11/13/2021   COVID-19 virus infection 11/13/2021   Hyperglycemia due to diabetes mellitus ([place]) 11/13/2021   Ankle wound, right, initial encounter 11/13/2021   Altered mental status 11/13/2021   Dehydration 11/13/2021   Long term (current) use of oral hypoglycemic drugs 10/29/2021   Unspecified macular degeneration 10/29/2021   Chronic kidney disease 05/17/2021   Personal history of malignant neoplasm of breast 10/29/2020   Malignant neoplasm of unspecified site of right  female breast ([place]) 07/16/2019   URI (upper respiratory infection) 11/20/2011   Other seasonal allergic rhinitis 11/23/2010   Urinary tract infection without hematuria 11/23/2010   RENAL INSUFFICIENCY 10/26/2010   OSTEOARTHRITIS 10/18/2010   Osteoarthritis 10/18/2010   Vitamin D deficiency 08/10/2009   Hyperlipidemia 08/10/2009   Body mass index (BMI) 39.0-39.9, adult 08/10/2009   EDEMA 01/20/2008   Anemia in chronic kidney disease 06/19/2007   Hypertension 06/02/2007   Ihor Austin, LPTA/CLT; CBIS [phone]  Aldona Lento, PTA 10/30/2022, 4:24 PM  [facility] [address] [place], [place], [address] Phone: [phone]   Fax:  [phone]  Name: AHLIYAH NIENOW MRN: [id number] Date of Birth: July 03, 1941

## 2022-11-01 ENCOUNTER — Ambulatory Visit (HOSPITAL_COMMUNITY): Payer: Medicare HMO

## 2022-11-01 ENCOUNTER — Encounter (HOSPITAL_COMMUNITY): Payer: Self-pay

## 2022-11-01 DIAGNOSIS — E11621 Type 2 diabetes mellitus with foot ulcer: Secondary | ICD-10-CM | POA: Diagnosis not present

## 2022-11-01 DIAGNOSIS — L97519 Non-pressure chronic ulcer of other part of right foot with unspecified severity: Secondary | ICD-10-CM | POA: Diagnosis not present

## 2022-11-01 DIAGNOSIS — R2689 Other abnormalities of gait and mobility: Secondary | ICD-10-CM

## 2022-11-01 NOTE — Therapy (Signed)
[place], [place], [address] Phone: [phone]   Fax:  [phone]  Wound Care Therapy  Patient Details  Name: Krista Payne MRN: [id number] Date of Birth: 12-01-1940 No data recorded  Encounter Date: 11/01/2022   PT End of Session - 11/01/22 1614     Visit Number 7    Number of Visits 12    Date for PT Re-Evaluation 11/15/22    Authorization Type Primary: Aetna Medicare Secondary: Tricare (no auth, no VL)    Progress Note Due on Visit 10    PT Start Time 1435    PT Stop Time 1513    PT Time Calculation (min) 38 min    Activity Tolerance Patient tolerated treatment well    Behavior During Therapy [facility] for tasks assessed/performed             Past Medical History:  Diagnosis Date   Breast cancer ([place])    R   Chronic kidney disease    Diabetes mellitus without complication ([place])    Hyperlipidemia    Hypertension    Macular degeneration    Osteoarthritis     Past Surgical History:  Procedure Laterality Date   CATARACT EXTRACTION W/PHACO Right 02/07/2015   Procedure: CATARACT EXTRACTION PHACO AND INTRAOCULAR LENS PLACEMENT ([place]);  Surgeon: Tonny Branch, MD;  Location: [facility];  Service: Ophthalmology;  Laterality: Right;  CDE:12.72   CATARACT EXTRACTION W/PHACO Left 02/24/2015   Procedure: CATARACT EXTRACTION PHACO AND INTRAOCULAR LENS PLACEMENT LEFT EYE;  Surgeon: Tonny Branch, MD;  Location: [facility];  Service: Ophthalmology;  Laterality: Left;  CDE 11.69   COLONOSCOPY      There were no vitals filed for this visit.               Wound Therapy - 11/01/22 0001     Subjective Reports of comfort, arrived with dressings intact.    Patient and Family Stated Goals wound to heal    Date of Onset 07/05/22    Prior Treatments cream    Pain Scale 0-10    Pain Score 0-No pain    Evaluation and Treatment Procedures Explained to Patient/Family Yes    Evaluation and Treatment Procedures agreed to     Wound Properties Date First Assessed: 10/04/22 Time First Assessed: 1345 Wound Type: Venous stasis ulcer Location: Pretibial Location Orientation: Distal;Right Wound Description (Comments): medial ankle wound superior Present on Admission: Yes   Wound Image Images linked: 1    Dressing Type Impregnated gauze (bismuth);Gauze (Comment)   vaseline, lymph netting #7, xeroform, extra cotton, kerlix, netting #5   Dressing Changed Changed    Dressing Status Old drainage    Dressing Change Frequency PRN    Site / Wound Assessment Yellow;Pink    % Wound base Red or Granulating 85%    % Wound base Yellow/Fibrinous Exudate 15%    Peri-wound Assessment Erythema (non-blanchable)    Drainage Amount Scant    Drainage Description Serous;No odor    Treatment Cleansed;Debridement (Selective)    Selective Debridement (non-excisional) - Location Rt  LE  wound base    Selective Debridement (non-excisional) - Tools Used Scalpel;Forceps    Selective Debridement (non-excisional) - Tissue Removed slough and dry skin    Wound Therapy - Clinical Statement Gait training to address shuffle and normalize gait mechanics.  Decreased redness upon removal of dressings though increased redness following debridement for removal of dead skin and decreased drainage noted.  Continued with xeroform  and additional cotton for cone shape to assist with sliding down.  Pt mentioned interest in compression garment, educated not to wear compression if arterial supply deficits.  Pt may benefit from ABI.    Wound Therapy - Functional Problem List bathing, dressing    Factors Delaying/Impairing Wound Healing Altered sensation;Diabetes Mellitus;Vascular compromise;Multiple medical problems    Hydrotherapy Plan Debridement;Dressing change;Patient/family education    Wound Therapy - Frequency 2X / week    Wound Therapy - Current Recommendations PT    Wound Plan assess if LE is less red with the abscense of compression.  Pt may have mixed  venous/arterial deficit.    Dressing  vaseline, lymph netting #7, xeroform, extra cotton, kerlix, netting #5                                  Patient will benefit from skilled therapeutic intervention in order to improve the following deficits and impairments:     Visit Diagnosis: Ulcer of right foot due to type 2 diabetes mellitus ([place])  Other abnormalities of gait and mobility     Problem List Patient Active Problem List   Diagnosis Date Noted   Unspecified Escherichia coli (E. coli) as the cause of diseases classified elsewhere 02/23/2022   Sepsis ([place]) 02/22/2022   Acute kidney failure ([place]) 02/22/2022   Fall at home, initial encounter 02/22/2022   Personal history of COVID-19 02/02/2022   Pressure injury of skin 11/15/2021   Depression, unspecified 11/15/2021   Hypertensive heart and chronic kidney disease with heart failure and stage 1 through stage 4 chronic kidney disease, or unspecified chronic kidney disease ([place]) 11/15/2021   Insomnia 11/15/2021   Muscle weakness (generalized) 11/15/2021   Other abnormalities of gait and mobility 11/15/2021   Other forms of acute ischemic heart disease 11/15/2021   Traumatic ischemia of muscle ([place]) 48/18/5631   Diastolic heart failure ([place]) 11/15/2021   Diabetic foot infection ([place]) 49/70/2637   Acute metabolic encephalopathy 85/88/5027   Chronic kidney disease, stage 3b ([place]) 11/14/2021   Type 2 diabetes with nephropathy ([place]) 11/14/2021   Elevated troponin 11/13/2021   Lactic acidosis 11/13/2021   COVID-19 virus infection 11/13/2021   Hyperglycemia due to diabetes mellitus ([place]) 11/13/2021   Ankle wound, right, initial encounter 11/13/2021   Altered mental status 11/13/2021   Dehydration 11/13/2021   Long term (current) use of oral hypoglycemic drugs 10/29/2021   Unspecified macular degeneration 10/29/2021   Chronic kidney disease 05/17/2021   Personal history of malignant neoplasm of breast  10/29/2020   Malignant neoplasm of unspecified site of right female breast ([place]) 07/16/2019   URI (upper respiratory infection) 11/20/2011   Other seasonal allergic rhinitis 11/23/2010   Urinary tract infection without hematuria 11/23/2010   RENAL INSUFFICIENCY 10/26/2010   OSTEOARTHRITIS 10/18/2010   Osteoarthritis 10/18/2010   Vitamin D deficiency 08/10/2009   Hyperlipidemia 08/10/2009   Body mass index (BMI) 39.0-39.9, adult 08/10/2009   EDEMA 01/20/2008   Anemia in chronic kidney disease 06/19/2007   Hypertension 06/02/2007   Ihor Austin, LPTA/CLT; CBIS [phone]  Aldona Lento, PTA 11/01/2022, 4:16 PM  [place] [facility] [address] [place], [place], [address] Phone: [phone]   Fax:  [phone]  Name: Krista Payne MRN: [id number] Date of Birth: 04-14-1941

## 2022-11-05 ENCOUNTER — Ambulatory Visit (HOSPITAL_COMMUNITY): Payer: Medicare HMO | Admitting: Physical Therapy

## 2022-11-05 DIAGNOSIS — R2689 Other abnormalities of gait and mobility: Secondary | ICD-10-CM | POA: Diagnosis not present

## 2022-11-05 DIAGNOSIS — E11621 Type 2 diabetes mellitus with foot ulcer: Secondary | ICD-10-CM | POA: Diagnosis not present

## 2022-11-05 DIAGNOSIS — L97519 Non-pressure chronic ulcer of other part of right foot with unspecified severity: Secondary | ICD-10-CM | POA: Diagnosis not present

## 2022-11-05 NOTE — Therapy (Signed)
OUTPATIENT PHYSICAL THERAPY Wound Treatment Patient Name: Krista Payne MRN: [id number] DOB:14-Jul-1941, 82 y.o., female Today's Date: 11/05/2022   PCP: Celene Squibb, MD REFERRING PROVIDER: Celene Squibb, MD  END OF SESSION:   PT End of Session - 11/05/22 1513     Visit Number 8    Number of Visits 12    Date for PT Re-Evaluation 11/15/22    Authorization Type Primary: Aetna Medicare Secondary: Tricare (no auth, no VL)    Progress Note Due on Visit 10    PT Start Time 1438    PT Stop Time 1510    PT Time Calculation (min) 32 min    Activity Tolerance Patient tolerated treatment well    Behavior During Therapy WFL for tasks assessed/performed                      Past Medical History:  Diagnosis Date   Breast cancer ([place])    R   Chronic kidney disease    Diabetes mellitus without complication ([place])    Hyperlipidemia    Hypertension    Macular degeneration    Osteoarthritis    Past Surgical History:  Procedure Laterality Date   CATARACT EXTRACTION W/PHACO Right 02/07/2015   Procedure: CATARACT EXTRACTION PHACO AND INTRAOCULAR LENS PLACEMENT ([place]);  Surgeon: Tonny Branch, MD;  Location: [facility];  Service: Ophthalmology;  Laterality: Right;  CDE:12.72   CATARACT EXTRACTION W/PHACO Left 02/24/2015   Procedure: CATARACT EXTRACTION PHACO AND INTRAOCULAR LENS PLACEMENT LEFT EYE;  Surgeon: Tonny Branch, MD;  Location: [facility];  Service: Ophthalmology;  Laterality: Left;  CDE 11.69   COLONOSCOPY     Patient Active Problem List   Diagnosis Date Noted   Unspecified Escherichia coli (E. coli) as the cause of diseases classified elsewhere 02/23/2022   Sepsis ([place]) 02/22/2022   Acute kidney failure ([place]) 02/22/2022   Fall at home, initial encounter 02/22/2022   Personal history of COVID-19 02/02/2022   Pressure injury of skin 11/15/2021   Depression, unspecified 11/15/2021   Hypertensive heart and chronic kidney disease with heart failure and stage 1 through stage 4 chronic  kidney disease, or unspecified chronic kidney disease ([place]) 11/15/2021   Insomnia 11/15/2021   Muscle weakness (generalized) 11/15/2021   Other abnormalities of gait and mobility 11/15/2021   Other forms of acute ischemic heart disease 11/15/2021   Traumatic ischemia of muscle ([place]) 22/63/3354   Diastolic heart failure ([place]) 11/15/2021   Diabetic foot infection ([place]) 56/25/6389   Acute metabolic encephalopathy 37/34/2876   Chronic kidney disease, stage 3b ([place]) 11/14/2021   Type 2 diabetes with nephropathy ([place]) 11/14/2021   Elevated troponin 11/13/2021   Lactic acidosis 11/13/2021   COVID-19 virus infection 11/13/2021   Hyperglycemia due to diabetes mellitus ([place]) 11/13/2021   Ankle wound, right, initial encounter 11/13/2021   Altered mental status 11/13/2021   Dehydration 11/13/2021   Long term (current) use of oral hypoglycemic drugs 10/29/2021   Unspecified macular degeneration 10/29/2021   Chronic kidney disease 05/17/2021   Personal history of malignant neoplasm of breast 10/29/2020   Malignant neoplasm of unspecified site of right female breast ([place]) 07/16/2019   URI (upper respiratory infection) 11/20/2011   Other seasonal allergic rhinitis 11/23/2010   Urinary tract infection without hematuria 11/23/2010   RENAL INSUFFICIENCY 10/26/2010   OSTEOARTHRITIS 10/18/2010   Osteoarthritis 10/18/2010   Vitamin D deficiency 08/10/2009   Hyperlipidemia 08/10/2009   Body mass index (BMI) 39.0-39.9, adult 08/10/2009   EDEMA 01/20/2008  Anemia in chronic kidney disease 06/19/2007   Hypertension 06/02/2007    ONSET DATE: 07/05/22  REFERRING DIAG: ulcer of right foot due to type 2 diabetes mellitus  THERAPY DIAG:  Ulcer of right foot due to type 2 diabetes mellitus ([place])  Rationale for Evaluation and Treatment: Rehabilitation     Wound Therapy - 11/05/22 1514     Subjective pt states her leg was itching so she scratched it.    Patient and Family Stated Goals wound to heal     Date of Onset 07/05/22    Prior Treatments cream    Pain Scale 0-10    Pain Score 0-No pain    Evaluation and Treatment Procedures Explained to Patient/Family Yes    Evaluation and Treatment Procedures agreed to    Wound Properties Date First Assessed: 10/04/22 Time First Assessed: 1345 Wound Type: Venous stasis ulcer Location: Pretibial Location Orientation: Distal;Right Wound Description (Comments): medial ankle wound superior Present on Admission: Yes   Dressing Type Impregnated gauze (bismuth);Gauze (Comment)   vaseline, lymph netting #7, xeroform, extra cotton, kerlix, netting #5   Dressing Changed Changed    Dressing Status Old drainage    Dressing Change Frequency PRN    Site / Wound Assessment Yellow;Pink    % Wound base Red or Granulating 85%    % Wound base Yellow/Fibrinous Exudate 15%    Peri-wound Assessment Erythema (non-blanchable)    Drainage Amount Minimal    Drainage Description Serous;No odor    Treatment Cleansed;Debridement (Selective)    Selective Debridement (non-excisional) - Location Rt  LE  wound base    Selective Debridement (non-excisional) - Tools Used Forceps    Selective Debridement (non-excisional) - Tissue Removed slough and dry skin    Wound Therapy - Clinical Statement continued to educate on gait to address shuffle and normalize gait mechanics.  Noted increased bottlenecking of LE due to scratching and pushing the dressing down.  Educated on the importance of not doing this as it could cause another wound and uneveness of the edema that is present. pt verbalized understanding. Cleansed and moisturized well following debridement. Continued with xerform and no compression.    Wound Therapy - Functional Problem List bathing, dressing    Factors Delaying/Impairing Wound Healing Altered sensation;Diabetes Mellitus;Vascular compromise;Multiple medical problems    Hydrotherapy Plan Debridement;Dressing change;Patient/family education    Wound Therapy -  Frequency 2X / week    Wound Therapy - Current Recommendations PT    Wound Plan continue with appropriate dressings, no compression as appears to have mixed venous/arterial deficit.    Dressing  Lotion, vaseline, xeroform, 4X4, ABD,  kerlix, netting #5                 PATIENT EDUCATION: Education details: Patient educated on exam findings, POC, scope of PT, HEP, and keeping dressing on unless soiled or panful. Person educated: Patient Education method: Explanation, Demonstration, and Handouts Education comprehension: verbalized understanding, returned demonstration, verbal cues required, and tactile cues required   HOME EXERCISE PROGRAM: N/a   GOALS: Goals reviewed with patient? Yes  SHORT TERM GOALS: Target date: 10/25/2022    Wound to be free from slough to demonstrate healing.  Baseline: Goal status: IN PROGRESS   LONG TERM GOALS: Target date: 11/15/2022    Patient wound to be healed to reduce risk of infection. Baseline:  Goal status: IN PROGRESS  2.  Patient will state importance of compression garment use and be able to don to reduce risk of further wound  development.  Baseline:  Goal status: IN PROGRESS   ASSESSMENT:  CLINICAL IMPRESSION: See above   OBJECTIVE IMPAIRMENTS: Abnormal gait, decreased activity tolerance, decreased balance, decreased mobility, difficulty walking, impaired sensation, and improper body mechanics.   ACTIVITY LIMITATIONS: carrying, lifting, standing, squatting, stairs, transfers, locomotion level, and caring for others  PARTICIPATION LIMITATIONS: meal prep, cleaning, laundry, shopping, community activity, and yard work  PERSONAL FACTORS: 3+ comorbidities: DM, CKD, HLD, HTN  are also affecting patient's functional outcome.   REHAB POTENTIAL: Good  CLINICAL DECISION MAKING: Evolving/moderate complexity  EVALUATION COMPLEXITY: Moderate  PLAN: PT FREQUENCY: 2x/week  PT DURATION: 6 weeks  PLANNED INTERVENTIONS:  Therapeutic exercises, Therapeutic activity, Neuromuscular re-education, Balance training, Gait training, Patient/Family education, Joint manipulation, Joint mobilization, Stair training, Orthotic/Fit training, DME instructions, Aquatic Therapy, Dry Needling, Electrical stimulation, Spinal manipulation, Spinal mobilization, Cryotherapy, Moist heat, Compression bandaging, scar mobilization, Splintting, Taping, Traction, Ultrasound, Ionotophoresis '4mg'$ /ml Dexamethasone, and Manual therapy   PLAN FOR NEXT SESSION: debride and dressing changes PRN    Teena Irani, PTA/CLT [place] Ph: [phone]  11/05/2022, 3:21 PM

## 2022-11-06 ENCOUNTER — Inpatient Hospital Stay: Payer: Medicare HMO | Attending: Hematology

## 2022-11-06 ENCOUNTER — Ambulatory Visit (HOSPITAL_COMMUNITY): Payer: Medicare HMO

## 2022-11-06 ENCOUNTER — Encounter (HOSPITAL_COMMUNITY): Payer: Medicare HMO

## 2022-11-08 ENCOUNTER — Telehealth: Payer: Self-pay

## 2022-11-08 ENCOUNTER — Ambulatory Visit (HOSPITAL_COMMUNITY): Payer: Medicare HMO | Admitting: Physical Therapy

## 2022-11-08 DIAGNOSIS — U071 COVID-19: Secondary | ICD-10-CM | POA: Diagnosis not present

## 2022-11-08 NOTE — Patient Outreach (Signed)
Received a Nurse Call Center notification for [name]. The Primary Care Physician has an Sales executive.   I have sent a referral to the [facility] Team.   [facility], Valentina Lucks [place] [facility] [phone]

## 2022-11-09 DIAGNOSIS — E1165 Type 2 diabetes mellitus with hyperglycemia: Secondary | ICD-10-CM | POA: Diagnosis not present

## 2022-11-12 ENCOUNTER — Ambulatory Visit (HOSPITAL_COMMUNITY): Payer: Medicare HMO | Admitting: Physical Therapy

## 2022-11-13 ENCOUNTER — Ambulatory Visit: Payer: Medicare HMO | Admitting: Hematology

## 2022-11-14 ENCOUNTER — Encounter: Payer: Self-pay | Admitting: Nurse Practitioner

## 2022-11-14 ENCOUNTER — Ambulatory Visit (INDEPENDENT_AMBULATORY_CARE_PROVIDER_SITE_OTHER): Payer: Medicare HMO | Admitting: Nurse Practitioner

## 2022-11-14 VITALS — BP 180/88 | HR 55 | Ht 63.0 in | Wt 205.4 lb

## 2022-11-14 DIAGNOSIS — E1122 Type 2 diabetes mellitus with diabetic chronic kidney disease: Secondary | ICD-10-CM

## 2022-11-14 DIAGNOSIS — N184 Chronic kidney disease, stage 4 (severe): Secondary | ICD-10-CM | POA: Diagnosis not present

## 2022-11-14 LAB — HEMOGLOBIN A1C: Hemoglobin A1C: 8.4

## 2022-11-14 MED ORDER — INSULIN GLARGINE 100 UNIT/ML ~~LOC~~ SOLN: 16.0000 [IU] | Freq: Every day | SUBCUTANEOUS | 3 refills | Status: DC

## 2022-11-14 NOTE — Progress Notes (Signed)
Endocrinology Follow Up Note       11/14/2022, 10:59 AM   Subjective:    Patient ID: Krista Payne, female    DOB: Nov 21, 1940.  Krista Payne is being seen in follow up after being seen in consultation for management of currently uncontrolled symptomatic diabetes requested by  Celene Squibb, MD.   Past Medical History:  Diagnosis Date   Breast cancer [facility])    R   Chronic kidney disease    Diabetes mellitus without complication ([place])    Hyperlipidemia    Hypertension    Macular degeneration    Osteoarthritis     Past Surgical History:  Procedure Laterality Date   CATARACT EXTRACTION W/PHACO Right 02/07/2015   Procedure: CATARACT EXTRACTION PHACO AND INTRAOCULAR LENS PLACEMENT ([place]);  Surgeon: Tonny Branch, MD;  Location: [facility];  Service: Ophthalmology;  Laterality: Right;  CDE:12.72   CATARACT EXTRACTION W/PHACO Left 02/24/2015   Procedure: CATARACT EXTRACTION PHACO AND INTRAOCULAR LENS PLACEMENT LEFT EYE;  Surgeon: Tonny Branch, MD;  Location: [facility];  Service: Ophthalmology;  Laterality: Left;  CDE 11.69   COLONOSCOPY      Social History   Socioeconomic History   Marital status: Widowed    Spouse name: Not on file   Number of children: 3   Years of education: 72   Highest education level: 12th grade  Occupational History   Not on file  Tobacco Use   Smoking status: Never    Passive exposure: Never   Smokeless tobacco: Never  Vaping Use   Vaping Use: Never used  Substance and Sexual Activity   Alcohol use: No   Drug use: No   Sexual activity: Not Currently    Partners: Male  Other Topics Concern   Not on file  Social History Narrative   Not on file   Social Determinants of Health   Financial Resource Strain: Low Risk  (07/19/2022)   Overall Financial Resource Strain (CARDIA)    Difficulty of Paying Living Expenses: Not hard at all  Food Insecurity: No Food Insecurity (07/19/2022)   Hunger  Vital Sign    Worried About Running Out of Food in the Last Year: Never true    [place] in the Last Year: Never true  Transportation Needs: No Transportation Needs (07/19/2022)   PRAPARE - Hydrologist (Medical): No    Lack of Transportation (Non-Medical): No  Physical Activity: Inactive (07/19/2022)   Exercise Vital Sign    Days of Exercise per Week: 0 days    Minutes of Exercise per Session: 0 min  Stress: No Stress Concern Present (07/19/2022)   [place]    Feeling of Stress : Not at all  Social Connections: Moderately Integrated (07/19/2022)   Social Connection and Isolation Panel [NHANES]    Frequency of Communication with Friends and Family: More than three times a week    Frequency of Social Gatherings with Friends and Family: More than three times a week    Attends Religious Services: More than 4 times per year    Active Member of [employer] or Organizations: Yes  Attends Archivist Meetings: More than 4 times per year    Marital Status: Widowed    Family History  Problem Relation Age of Onset   Macular degeneration Mother    Diabetes Father    Heart disease Daughter     Outpatient Encounter Medications as of 11/14/2022  Medication Sig   amLODipine (NORVASC) 5 MG tablet Take 0.5 tablets (2.5 mg total) by mouth daily.   anastrozole (ARIMIDEX) 1 MG tablet Take 1 tablet (1 mg total) by mouth daily.   atenolol (TENORMIN) 25 MG tablet Take 25 mg by mouth daily.   buPROPion (WELLBUTRIN XL) 150 MG 24 hr tablet Take 300 mg by mouth daily as needed (feeling moody/depressed).   furosemide (LASIX) 20 MG tablet Take 10 mg by mouth daily as needed for fluid.   Insulin Pen Needle (PEN NEEDLES) 31G X 6 MM MISC Use to inject insulin once daily   mupirocin ointment (BACTROBAN) 2 % SMARTSIG:sparingly Topical 3 Times Daily   olmesartan (BENICAR) 5 MG tablet Take 5 mg by mouth daily.    simvastatin (ZOCOR) 20 MG tablet Take 1 tablet (20 mg total) by mouth at bedtime.   [DISCONTINUED] insulin glargine (LANTUS) 100 UNIT/ML injection Inject 0.12 mLs (12 Units total) into the skin at bedtime.   insulin glargine (LANTUS) 100 UNIT/ML injection Inject 0.16 mLs (16 Units total) into the skin at bedtime.   No facility-administered encounter medications on file as of 11/14/2022.    ALLERGIES: No Known Allergies  VACCINATION STATUS: Immunization History  Administered Date(s) Administered   Influenza Split 11/13/2011   Influenza Whole 07/20/2008, 08/10/2009, 10/18/2010   Influenza-Unspecified 07/29/2013, 08/12/2020   Pneumococcal Polysaccharide-23 07/20/2008   Td 07/29/1998, 08/10/2009    Diabetes She presents for her follow-up diabetic visit. She has type 2 diabetes mellitus. Her disease course has been improving. There are no hypoglycemic associated symptoms. Associated symptoms include fatigue. Pertinent negatives for diabetes include no polydipsia, no polyuria and no weight loss. There are no hypoglycemic complications. Symptoms are improving. Diabetic complications include nephropathy and peripheral neuropathy. Risk factors for coronary artery disease include diabetes mellitus, dyslipidemia, family history, hypertension, sedentary lifestyle, post-menopausal and obesity. Current diabetic treatment includes insulin injections. She is compliant with treatment most of the time. Her weight is fluctuating minimally. She is following a generally healthy diet. When asked about meal planning, she reported none (gets meals on wheels). She has not had a previous visit with a dietitian. She participates in exercise intermittently. Her home blood glucose trend is fluctuating minimally. Her overall blood glucose range is >200 mg/dl. (She presents today with her CGM showing slightly above target glycemic profile.  Her POCT A1c today is 8.4%, improving slightly from last visit of 8.9%.  She denies  any hypoglycemia.  Analysis of her CGM shows TIR 26%, TAR 74%, TBR 0% with a GMI of 8.8%.  She notes she did consume some sweets during the holidays.) An ACE inhibitor/angiotensin II receptor blocker is being taken. She sees a podiatrist.Eye exam is current.  Hyperlipidemia This is a chronic problem. The current episode started more than 1 year ago. The problem is controlled. Recent lipid tests were reviewed and are variable. Exacerbating diseases include chronic renal disease, diabetes and obesity. Factors aggravating her hyperlipidemia include beta blockers and fatty foods. Current antihyperlipidemic treatment includes statins. The current treatment provides moderate improvement of lipids. Compliance problems include adherence to diet and adherence to exercise.  Risk factors for coronary artery disease include diabetes mellitus, dyslipidemia, family  history, hypertension, obesity, post-menopausal and a sedentary lifestyle.  Hypertension This is a chronic problem. The current episode started more than 1 year ago. The problem has been resolved since onset. The problem is controlled. There are no associated agents to hypertension. Risk factors for coronary artery disease include diabetes mellitus, dyslipidemia, family history, obesity and sedentary lifestyle. Past treatments include calcium channel blockers, angiotensin blockers and diuretics. There are no compliance problems.  Hypertensive end-organ damage includes kidney disease. Identifiable causes of hypertension include chronic renal disease.    Review of systems  Constitutional: + Minimally fluctuating body weight,  current Body mass index is 36.38 kg/m. , no fatigue, no subjective hyperthermia, no subjective hypothermia Eyes: no blurry vision, no xerophthalmia ENT: no sore throat, no nodules palpated in throat, no dysphagia/odynophagia, no hoarseness Cardiovascular: no chest pain, no shortness of breath, no palpitations, no leg  swelling Respiratory: no cough, no shortness of breath Gastrointestinal: no nausea/vomiting/diarrhea Musculoskeletal: no muscle/joint aches- reports no falls since last visit Skin: no rashes, no hyperemia Neurological: no tremors, no numbness, no tingling, no dizziness Psychiatric: no depression, no anxiety   Objective:     BP (!) 180/88 (BP Location: Left Arm, Patient Position: Sitting, Cuff Size: Large) Comment: Manuel Cuff  Pulse (!) 55   Ht '5\' 3"'$  (1.6 m)   Wt 205 lb 6.4 oz (93.2 kg)   BMI 36.38 kg/m   Wt Readings from Last 3 Encounters:  11/14/22 205 lb 6.4 oz (93.2 kg)  08/27/22 205 lb (93 kg)  06/14/22 202 lb (91.6 kg)     BP Readings from Last 3 Encounters:  11/14/22 (!) 180/88  08/27/22 (!) 190/73  06/14/22 (!) 161/71      Physical Exam- Limited  Constitutional:  Body mass index is 36.38 kg/m. , not in acute distress, normal state of mind Eyes:  EOMI, no exophthalmos Musculoskeletal: no gross deformities, strength intact in all four extremities, no gross restriction of joint movements, reports no falls since last visit Skin:  no rashes, no hyperemia Neurological: no tremor with outstretched hands   Diabetic Foot Exam - Simple   No data filed     CMP ( most recent) CMP     Component Value Date/Time   NA 133 (L) 08/20/2022 1432   NA 140 06/22/2022 0000   K 4.7 08/20/2022 1432   CL 103 08/20/2022 1432   CO2 22 08/20/2022 1432   GLUCOSE 239 (H) 08/20/2022 1432   BUN 32 (H) 08/20/2022 1432   BUN 39 (A) 06/22/2022 0000   CREATININE 1.54 (H) 08/20/2022 1432   CALCIUM 9.0 08/20/2022 1432   PROT 7.5 08/20/2022 1432   ALBUMIN 3.6 08/20/2022 1432   AST 15 08/20/2022 1432   ALT 11 08/20/2022 1432   ALKPHOS 65 08/20/2022 1432   BILITOT 0.8 08/20/2022 1432   GFRNONAA 34 (L) 08/20/2022 1432   GFRAA 43 (L) 06/01/2020 1259     Diabetic Labs (most recent): Lab Results  Component Value Date   HGBA1C 8.9 06/21/2022   HGBA1C 8.1 (A) 06/14/2022   HGBA1C  13.2 02/15/2022     Lipid Panel ( most recent) Lipid Panel     Component Value Date/Time   CHOL 183 06/21/2022 0000   TRIG 166 (A) 06/21/2022 0000   HDL 52 06/21/2022 0000   CHOLHDL 3 02/20/2011 1051   VLDL 27.8 02/20/2011 1051   LDLCALC 102 06/21/2022 0000   LDLDIRECT 168.8 10/19/2008 0914      Lab Results  Component Value Date  TSH 1.07 02/20/2011   TSH 1.70 10/18/2010   TSH 1.49 08/10/2009   TSH 1.23 07/20/2008   TSH 1.39 06/19/2007           Assessment & Plan:   1) Type 2 diabetes mellitus with stage 4 chronic kidney disease, without long-term current use of insulin ([place])  She presents today with her CGM showing slightly above target glycemic profile.  Her POCT A1c today is 8.4%, improving slightly from last visit of 8.9%.  She denies any hypoglycemia.  Analysis of her CGM shows TIR 26%, TAR 74%, TBR 0% with a GMI of 8.8%.  She notes she did consume some sweets during the holidays.  - Krista Payne has currently uncontrolled symptomatic type 2 DM since 82 years of age.   -Recent labs reviewed.  - I had a long discussion with her about the progressive nature of diabetes and the pathology behind its complications. -her diabetes is complicated by CKD and she remains at a high risk for more acute and chronic complications which include CAD, CVA, CKD, retinopathy, and neuropathy. These are all discussed in detail with her.  - Nutritional counseling repeated at each appointment due to patients tendency to fall back in to old habits.  - The patient admits there is a room for improvement in their diet and drink choices. -  Suggestion is made for the patient to avoid simple carbohydrates from their diet including Cakes, Sweet Desserts / Pastries, Ice Cream, Soda (diet and regular), Sweet Tea, Candies, Chips, Cookies, Sweet Pastries, Store Bought Juices, Alcohol in Excess of 1-2 drinks a day, Artificial Sweeteners, Coffee Creamer, and "Sugar-free" Products. This will help  patient to have stable blood glucose profile and potentially avoid unintended weight gain.   - I encouraged the patient to switch to unprocessed or minimally processed complex starch and increased protein intake (animal or plant source), fruits, and vegetables.   - Patient is advised to stick to a routine mealtimes to eat 3 meals a day and avoid unnecessary snacks (to snack only to correct hypoglycemia).  - I have approached her with the following individualized plan to manage her diabetes and patient agrees:   -She is advised to increase her Lantus to 16 units SQ nightly.   -she is encouraged to continue monitoring glucose at least twice daily, before breakfast and before bed (using her CGM), and to call the clinic if she has readings less than 70 or above 300 for 3 tests in a row.     - she is warned not to take insulin without proper monitoring per orders. - Adjustment parameters are given to her for hypo and hyperglycemia in writing.  - her Glipizide was discontinued, risk outweighs benefit for this patient given her CKD and advanced age, this medication increases her risk of severe hypoglycemia. - she is not a candidate for Metformin due to concurrent renal insufficiency.  - she will be considered for incretin therapy as appropriate next visit.  - Specific targets for  A1c; LDL, HDL, and Triglycerides were discussed with the patient.  2) Blood Pressure /Hypertension:  her blood pressure is not controlled to target today but she has NOT yet taken her medications.   she is advised to continue her current medications including Norvasc 5 mg p.o. daily with breakfast, Atenolol 25 mg po daily, Lasix 10 mg po daily, and Benicar 5 mg po daily.  3) Lipids/Hyperlipidemia:    Review of her recent lipid panel from 06/12/21 showed controlled LDL  at 92 and elevated triglycerides of 175 .  she is advised to continue Zocor 20 mg daily at bedtime.  Side effects and precautions discussed with her.  4)   Weight/Diet:  her Body mass index is 36.38 kg/m.  -  clearly complicating her diabetes care.   she is a candidate for weight loss. I discussed with her the fact that loss of 5 - 10% of her  current body weight will have the most impact on her diabetes management.  Exercise, and detailed carbohydrates information provided  -  detailed on discharge instructions.  5) Chronic Care/Health Maintenance: -she is on ACEI/ARB and Statin medications and is encouraged to initiate and continue to follow up with Ophthalmology, Dentist, Podiatrist at least yearly or according to recommendations, and advised to stay away from smoking. I have recommended yearly flu vaccine and pneumonia vaccine at least every 5 years; moderate intensity exercise for up to 150 minutes weekly; and sleep for at least 7 hours a day.  - she is advised to maintain close follow up with Celene Squibb, MD for primary care needs, as well as her other providers for optimal and coordinated care.      I spent 30 minutes in the care of the patient today including review of labs from [place], Lipids, Thyroid Function, Hematology (current and previous including abstractions from other facilities); face-to-face time discussing  her blood glucose readings/logs, discussing hypoglycemia and hyperglycemia episodes and symptoms, medications doses, her options of short and long term treatment based on the latest standards of care / guidelines;  discussion about incorporating lifestyle medicine;  and documenting the encounter. Risk reduction counseling performed per USPSTF guidelines to reduce obesity and cardiovascular risk factors.     Please refer to Patient Instructions for Blood Glucose Monitoring and Insulin/Medications Dosing Guide"  in media tab for additional information. Please  also refer to " Patient Self Inventory" in the Media  tab for reviewed elements of pertinent patient history.  Krista Payne participated in the discussions, expressed  understanding, and voiced agreement with the above plans.  All questions were answered to her satisfaction. she is encouraged to contact clinic should she have any questions or concerns prior to her return visit.     Follow up plan: - Return for Diabetes F/U with A1c in office, No previsit labs, Bring meter and logs.   Rayetta Pigg, [facility] [address] [place], [place] [address] Phone: [phone] Fax: [phone]  11/14/2022, 10:59 AM

## 2022-11-15 ENCOUNTER — Encounter (HOSPITAL_COMMUNITY): Payer: Medicare HMO

## 2022-11-15 ENCOUNTER — Ambulatory Visit (HOSPITAL_COMMUNITY): Payer: Medicare HMO

## 2022-11-15 ENCOUNTER — Inpatient Hospital Stay: Payer: Medicare HMO

## 2022-11-15 ENCOUNTER — Ambulatory Visit (HOSPITAL_COMMUNITY): Payer: Medicare HMO | Admitting: Physical Therapy

## 2022-11-15 ENCOUNTER — Encounter: Payer: Self-pay | Admitting: Nurse Practitioner

## 2022-11-21 ENCOUNTER — Encounter (HOSPITAL_COMMUNITY): Payer: Self-pay | Admitting: Physical Therapy

## 2022-11-21 ENCOUNTER — Ambulatory Visit (HOSPITAL_COMMUNITY): Payer: Medicare HMO | Admitting: Physical Therapy

## 2022-11-21 DIAGNOSIS — R2689 Other abnormalities of gait and mobility: Secondary | ICD-10-CM

## 2022-11-21 DIAGNOSIS — L97519 Non-pressure chronic ulcer of other part of right foot with unspecified severity: Secondary | ICD-10-CM | POA: Diagnosis not present

## 2022-11-21 DIAGNOSIS — E11621 Type 2 diabetes mellitus with foot ulcer: Secondary | ICD-10-CM

## 2022-11-21 NOTE — Therapy (Signed)
OUTPATIENT PHYSICAL THERAPY Wound Treatment Patient Name: Krista Payne MRN: [id number] DOB:[birth year], 82 y.o., female Today's Date: 11/21/2022  Progress Note   Reporting Period 10/05/23 to 11/21/22   See note below for Objective Data and Assessment of Progress/Goals   PCP: Celene Squibb, MD REFERRING PROVIDER: Celene Squibb, MD  END OF SESSION:   PT End of Session - 11/21/22 1037     Visit Number 9    Number of Visits 20    Date for PT Re-Evaluation 12/19/22    Authorization Type Primary: Aetna Medicare Secondary: Tricare (no auth, no VL)    Progress Note Due on Visit 19    PT Start Time 1037    PT Stop Time 1111    PT Time Calculation (min) 34 min    Activity Tolerance Patient tolerated treatment well    Behavior During Therapy WFL for tasks assessed/performed                      Past Medical History:  Diagnosis Date   Breast cancer ([place])    R   Chronic kidney disease    Diabetes mellitus without complication ([place])    Hyperlipidemia    Hypertension    Macular degeneration    Osteoarthritis    Past Surgical History:  Procedure Laterality Date   CATARACT EXTRACTION W/PHACO Right 02/07/2015   Procedure: CATARACT EXTRACTION PHACO AND INTRAOCULAR LENS PLACEMENT ([place]);  Surgeon: Tonny Branch, MD;  Location: [facility];  Service: Ophthalmology;  Laterality: Right;  CDE:12.72   CATARACT EXTRACTION W/PHACO Left 02/24/2015   Procedure: CATARACT EXTRACTION PHACO AND INTRAOCULAR LENS PLACEMENT LEFT EYE;  Surgeon: Tonny Branch, MD;  Location: [facility];  Service: Ophthalmology;  Laterality: Left;  CDE 11.69   COLONOSCOPY     Patient Active Problem List   Diagnosis Date Noted   Unspecified Escherichia coli (E. coli) as the cause of diseases classified elsewhere 02/23/2022   Sepsis ([place]) 02/22/2022   Acute kidney failure ([place]) 02/22/2022   Fall at home, initial encounter 02/22/2022   Personal history of COVID-19 02/02/2022   Pressure injury of skin 11/15/2021   Depression,  unspecified 11/15/2021   Hypertensive heart and chronic kidney disease with heart failure and stage 1 through stage 4 chronic kidney disease, or unspecified chronic kidney disease ([place]) 11/15/2021   Insomnia 11/15/2021   Muscle weakness (generalized) 11/15/2021   Other abnormalities of gait and mobility 11/15/2021   Other forms of acute ischemic heart disease 11/15/2021   Traumatic ischemia of muscle ([place]) 53/97/6734   Diastolic heart failure ([place]) 11/15/2021   Diabetic foot infection ([place]) 19/37/9024   Acute metabolic encephalopathy 09/73/5329   Chronic kidney disease, stage 3b ([place]) 11/14/2021   Type 2 diabetes with nephropathy ([place]) 11/14/2021   Elevated troponin 11/13/2021   Lactic acidosis 11/13/2021   COVID-19 virus infection 11/13/2021   Hyperglycemia due to diabetes mellitus ([place]) 11/13/2021   Ankle wound, right, initial encounter 11/13/2021   Altered mental status 11/13/2021   Dehydration 11/13/2021   Long term (current) use of oral hypoglycemic drugs 10/29/2021   Unspecified macular degeneration 10/29/2021   Chronic kidney disease 05/17/2021   Personal history of malignant neoplasm of breast 10/29/2020   Malignant neoplasm of unspecified site of right female breast ([place]) 07/16/2019   URI (upper respiratory infection) 11/20/2011   Other seasonal allergic rhinitis 11/23/2010   Urinary tract infection without hematuria 11/23/2010   RENAL INSUFFICIENCY 10/26/2010   OSTEOARTHRITIS 10/18/2010   Osteoarthritis 10/18/2010  Vitamin D deficiency 08/10/2009   Hyperlipidemia 08/10/2009   Body mass index (BMI) 39.0-39.9, adult 08/10/2009   EDEMA 01/20/2008   Anemia in chronic kidney disease 06/19/2007   Hypertension 06/02/2007    ONSET DATE: 07/05/22  REFERRING DIAG: ulcer of right foot due to type 2 diabetes mellitus  THERAPY DIAG:  Ulcer of right foot due to type 2 diabetes mellitus ([place])  Other abnormalities of gait and mobility  Rationale for Evaluation and Treatment:  Rehabilitation     Wound Therapy - 11/21/22 0001     Subjective Patient has not changed dressing since last session. She was sick so she was not able to come.    Patient and Family Stated Goals wound to heal    Date of Onset 07/05/22    Prior Treatments cream    Pain Score 0-No pain    Evaluation and Treatment Procedures Explained to Patient/Family Yes    Evaluation and Treatment Procedures agreed to    Wound Properties Date First Assessed: 10/04/22 Time First Assessed: 1345 Wound Type: Venous stasis ulcer Location: Pretibial Location Orientation: Distal;Right Wound Description (Comments): medial ankle wound superior Present on Admission: Yes   Wound Image Images linked: 2    Dressing Type Impregnated gauze (bismuth);Gauze (Comment)   vaseline,  xeroform, extra cotton, kerlix, netting #5   Dressing Status Old drainage    Dressing Change Frequency PRN    Site / Wound Assessment Yellow;Pink    % Wound base Red or Granulating 95%    % Wound base Yellow/Fibrinous Exudate 5%    Peri-wound Assessment Erythema (non-blanchable)    Drainage Amount Minimal    Drainage Description Serous    Treatment Cleansed;Debridement (Selective)    Selective Debridement (non-excisional) - Location Rt  LE  wound base and peeling skin throughout RLE    Selective Debridement (non-excisional) - Tools Used Forceps    Selective Debridement (non-excisional) - Tissue Removed slough and dry skin    Wound Therapy - Clinical Statement Patient arrives with same dressing that was applied on 1/8 (over 2 weeks ago). Dressing was soiled, wet and filthy, oderous, and sliding down leg cutting into skin. Upon removal of dressing, throughout RLE there was flaking skin, dried exudate, spots of red irritation and was bleeding from proximal area of wrap where dressing slid down and was digging into skin. Strong odor from leg but likely due to poor hygeine with dressing being on for over 2 weeks. Educated on changing dressing if soiled  between sessions. Significant cleansing of leg and debridment of dry skin required. wound bed mosly scar tissue but one small open spot remained. Applied lotion and vaseline and dressed irritated area with xeroform, 4x4s, abd pads, and kerlix followed by netting. Patient has not met goals at this time and will extend POC 2x /week for 4 weeks to promote wound healing and transition to self care.    Wound Therapy - Functional Problem List bathing, dressing    Factors Delaying/Impairing Wound Healing Altered sensation;Diabetes Mellitus;Vascular compromise;Multiple medical problems    Hydrotherapy Plan Debridement;Dressing change;Patient/family education    Wound Therapy - Frequency 2X / week    Wound Therapy - Current Recommendations PT    Wound Plan continue with appropriate dressings, no compression as appears to have mixed venous/arterial deficit.    Dressing  Lotion, vaseline, xeroform, 4X4, ABD,  kerlix, netting #5                 PATIENT EDUCATION: Education details: 11/21/22, POC, changing dressing  if soiled; EVAL Patient educated on exam findings, POC, scope of PT, HEP, and keeping dressing on unless soiled or panful. Person educated: Patient Education method: Explanation, Demonstration, and Handouts Education comprehension: verbalized understanding, returned demonstration, verbal cues required, and tactile cues required   HOME EXERCISE PROGRAM: N/a   GOALS: Goals reviewed with patient? Yes  SHORT TERM GOALS: Target date: 10/25/2022    Wound to be free from slough to demonstrate healing.  Baseline: Goal status: IN PROGRESS   LONG TERM GOALS: Target date: 11/15/2022    Patient wound to be healed to reduce risk of infection. Baseline:  Goal status: IN PROGRESS  2.  Patient will state importance of compression garment use and be able to don to reduce risk of further wound development.  Baseline:  Goal status: IN PROGRESS   ASSESSMENT:  CLINICAL IMPRESSION: See  above   OBJECTIVE IMPAIRMENTS: Abnormal gait, decreased activity tolerance, decreased balance, decreased mobility, difficulty walking, impaired sensation, and improper body mechanics.   ACTIVITY LIMITATIONS: carrying, lifting, standing, squatting, stairs, transfers, locomotion level, and caring for others  PARTICIPATION LIMITATIONS: meal prep, cleaning, laundry, shopping, community activity, and yard work  PERSONAL FACTORS: 3+ comorbidities: DM, CKD, HLD, HTN  are also affecting patient's functional outcome.   REHAB POTENTIAL: Good  CLINICAL DECISION MAKING: Evolving/moderate complexity  EVALUATION COMPLEXITY: Moderate  PLAN: PT FREQUENCY: 2x/week  PT DURATION: 4 weeks  PLANNED INTERVENTIONS: Therapeutic exercises, Therapeutic activity, Neuromuscular re-education, Balance training, Gait training, Patient/Family education, Joint manipulation, Joint mobilization, Stair training, Orthotic/Fit training, DME instructions, Aquatic Therapy, Dry Needling, Electrical stimulation, Spinal manipulation, Spinal mobilization, Cryotherapy, Moist heat, Compression bandaging, scar mobilization, Splintting, Taping, Traction, Ultrasound, Ionotophoresis '4mg'$ /ml Dexamethasone, and Manual therapy   PLAN FOR NEXT SESSION: debride and dressing changes PRN    11:31 AM, 11/21/22 Mearl Latin PT, DPT Physical Therapist at [facility]

## 2022-11-22 ENCOUNTER — Ambulatory Visit: Payer: Medicare HMO | Admitting: Hematology

## 2022-11-22 ENCOUNTER — Ambulatory Visit (HOSPITAL_COMMUNITY): Payer: Medicare HMO | Admitting: Physical Therapy

## 2022-11-26 ENCOUNTER — Ambulatory Visit (HOSPITAL_COMMUNITY): Payer: Medicare HMO | Admitting: Physical Therapy

## 2022-11-26 DIAGNOSIS — H353231 Exudative age-related macular degeneration, bilateral, with active choroidal neovascularization: Secondary | ICD-10-CM | POA: Diagnosis not present

## 2022-11-26 DIAGNOSIS — H35033 Hypertensive retinopathy, bilateral: Secondary | ICD-10-CM | POA: Diagnosis not present

## 2022-11-26 DIAGNOSIS — H43813 Vitreous degeneration, bilateral: Secondary | ICD-10-CM | POA: Diagnosis not present

## 2022-11-28 ENCOUNTER — Ambulatory Visit: Payer: Medicare HMO | Admitting: Hematology

## 2022-11-28 DIAGNOSIS — M79675 Pain in left toe(s): Secondary | ICD-10-CM | POA: Diagnosis not present

## 2022-11-28 DIAGNOSIS — I739 Peripheral vascular disease, unspecified: Secondary | ICD-10-CM | POA: Diagnosis not present

## 2022-11-28 DIAGNOSIS — M79672 Pain in left foot: Secondary | ICD-10-CM | POA: Diagnosis not present

## 2022-11-28 DIAGNOSIS — M79674 Pain in right toe(s): Secondary | ICD-10-CM | POA: Diagnosis not present

## 2022-11-28 DIAGNOSIS — L110 Acquired keratosis follicularis: Secondary | ICD-10-CM | POA: Diagnosis not present

## 2022-11-28 DIAGNOSIS — M79671 Pain in right foot: Secondary | ICD-10-CM | POA: Diagnosis not present

## 2022-11-29 ENCOUNTER — Ambulatory Visit (HOSPITAL_COMMUNITY): Payer: Medicare HMO | Attending: Internal Medicine | Admitting: Physical Therapy

## 2022-11-29 ENCOUNTER — Telehealth (HOSPITAL_COMMUNITY): Payer: Self-pay | Admitting: Physical Therapy

## 2022-11-29 DIAGNOSIS — L97519 Non-pressure chronic ulcer of other part of right foot with unspecified severity: Secondary | ICD-10-CM | POA: Insufficient documentation

## 2022-11-29 DIAGNOSIS — R2689 Other abnormalities of gait and mobility: Secondary | ICD-10-CM | POA: Diagnosis not present

## 2022-11-29 DIAGNOSIS — E11621 Type 2 diabetes mellitus with foot ulcer: Secondary | ICD-10-CM | POA: Diagnosis not present

## 2022-11-29 NOTE — Therapy (Addendum)
OUTPATIENT PHYSICAL THERAPY Wound Treatment Patient Name: Krista Payne MRN: [id number] DOB:04-12-1941, 82 y.o., female Today's Date: 11/29/2022   PCP: Krista Squibb, MD REFERRING PROVIDER: Celene Squibb, MD  END OF SESSION:   PT End of Session - 11/29/22 1331     Visit Number 10    Number of Visits 20    Date for PT Re-Evaluation 12/19/22    Authorization Type Primary: Aetna Medicare Secondary: Tricare (no auth, no VL)    Progress Note Due on Visit 19    PT Start Time 1120    PT Stop Time 1200    PT Time Calculation (min) 40 min    Activity Tolerance Patient tolerated treatment well    Behavior During Therapy WFL for tasks assessed/performed                      Past Medical History:  Diagnosis Date   Breast cancer ([place])    R   Chronic kidney disease    Diabetes mellitus without complication ([place])    Hyperlipidemia    Hypertension    Macular degeneration    Osteoarthritis    Past Surgical History:  Procedure Laterality Date   CATARACT EXTRACTION W/PHACO Right 02/07/2015   Procedure: CATARACT EXTRACTION PHACO AND INTRAOCULAR LENS PLACEMENT ([place]);  Surgeon: Tonny Branch, MD;  Location: [facility];  Service: Ophthalmology;  Laterality: Right;  CDE:12.72   CATARACT EXTRACTION W/PHACO Left 02/24/2015   Procedure: CATARACT EXTRACTION PHACO AND INTRAOCULAR LENS PLACEMENT LEFT EYE;  Surgeon: Tonny Branch, MD;  Location: [facility];  Service: Ophthalmology;  Laterality: Left;  CDE 11.69   COLONOSCOPY     Patient Active Problem List   Diagnosis Date Noted   Unspecified Escherichia coli (E. coli) as the cause of diseases classified elsewhere 02/23/2022   Sepsis ([place]) 02/22/2022   Acute kidney failure ([place]) 02/22/2022   Fall at home, initial encounter 02/22/2022   Personal history of COVID-19 02/02/2022   Pressure injury of skin 11/15/2021   Depression, unspecified 11/15/2021   Hypertensive heart and chronic kidney disease with heart failure and stage 1 through stage 4 chronic  kidney disease, or unspecified chronic kidney disease ([place]) 11/15/2021   Insomnia 11/15/2021   Muscle weakness (generalized) 11/15/2021   Other abnormalities of gait and mobility 11/15/2021   Other forms of acute ischemic heart disease 11/15/2021   Traumatic ischemia of muscle ([place]) 33/29/5188   Diastolic heart failure ([place]) 11/15/2021   Diabetic foot infection ([place]) 41/66/0630   Acute metabolic encephalopathy 16/10/930   Chronic kidney disease, stage 3b ([place]) 11/14/2021   Type 2 diabetes with nephropathy ([place]) 11/14/2021   Elevated troponin 11/13/2021   Lactic acidosis 11/13/2021   COVID-19 virus infection 11/13/2021   Hyperglycemia due to diabetes mellitus ([place]) 11/13/2021   Ankle wound, right, initial encounter 11/13/2021   Altered mental status 11/13/2021   Dehydration 11/13/2021   Long term (current) use of oral hypoglycemic drugs 10/29/2021   Unspecified macular degeneration 10/29/2021   Chronic kidney disease 05/17/2021   Personal history of malignant neoplasm of breast 10/29/2020   Malignant neoplasm of unspecified site of right female breast ([place]) 07/16/2019   URI (upper respiratory infection) 11/20/2011   Other seasonal allergic rhinitis 11/23/2010   Urinary tract infection without hematuria 11/23/2010   RENAL INSUFFICIENCY 10/26/2010   OSTEOARTHRITIS 10/18/2010   Osteoarthritis 10/18/2010   Vitamin D deficiency 08/10/2009   Hyperlipidemia 08/10/2009   Body mass index (BMI) 39.0-39.9, adult 08/10/2009   EDEMA 01/20/2008  Anemia in chronic kidney disease 06/19/2007   Hypertension 06/02/2007    ONSET DATE: 07/05/22  REFERRING DIAG: ulcer of right foot due to type 2 diabetes mellitus  THERAPY DIAG:  Ulcer of right foot due to type 2 diabetes mellitus ([place])  Other abnormalities of gait and mobility  Rationale for Evaluation and Treatment: Rehabilitation     Wound Therapy - 11/29/22 1332     Subjective pt states she had to cancel her last appt due to  conflicting appt.  Pt has not been seen in 1 week.    Patient and Family Stated Goals wound to heal    Date of Onset 07/05/22    Prior Treatments cream    Pain Score 0-No pain    Evaluation and Treatment Procedures Explained to Patient/Family Yes    Evaluation and Treatment Procedures agreed to    Wound Properties Date First Assessed: 10/04/22 Time First Assessed: 1345 Wound Type: Venous stasis ulcer Location: Pretibial Location Orientation: Distal;Right Wound Description (Comments): medial ankle wound superior Present on Admission: Yes   Wound Image Images linked: 1    Dressing Type Impregnated gauze (bismuth);Gauze (Comment)   vaseline,  xeroform, extra cotton, kerlix, netting #5   Dressing Status Old drainage    Dressing Change Frequency PRN    Site / Wound Assessment Yellow;Pink    % Wound base Red or Granulating 50%    % Wound base Other/Granulation Tissue (Comment) 50%   dry flaky non viable tissue   Peri-wound Assessment Erythema (non-blanchable)    Drainage Amount Moderate    Drainage Description Serous    Treatment Cleansed;Debridement (Selective)    Selective Debridement (non-excisional) - Location Rt  LE  wound base and peeling skin throughout RLE    Selective Debridement (non-excisional) - Tools Used Forceps    Selective Debridement (non-excisional) - Tissue Removed slough and dry skin    Wound Therapy - Clinical Statement Pt returns with same dressing pushed down with severe bottlenecking present.  Odor present from stagnant drainage.  Skin very raw with dry skin and devitalized tissue present in whole area.  Cleansed well with heavy debridement to remove this tissue. Changed dressing to silver hydrofiber around draining areas and continued with xerform on dorsal foot. Discussed possible need for vein/vascular referral to determine compression needs. Discussed with evaluating therapist and left message with Dr Durene Cal nurse regarding above and possible ABI to determine arterial  involvement.  Therapist also added extra foam to distal LE and 1/2" foam cut for anterior/posterior aspect to prevent slippage.  PT reported overall comfort.  Encouraged pt to get different shoes as the slippers she is wearing pushes her dressing back off her foot and is unsafe as there is no back on them.  Also noted the toe of her Rt shoe is disintegrating.    Wound Therapy - Functional Problem List bathing, dressing    Factors Delaying/Impairing Wound Healing Altered sensation;Diabetes Mellitus;Vascular compromise;Multiple medical problems    Hydrotherapy Plan Debridement;Dressing change;Patient/family education    Wound Therapy - Frequency 2X / week    Wound Therapy - Current Recommendations PT    Wound Plan continue with appropriate dressings, no compression as appears to have mixed venous/arterial deficit.  Follow up if MD has referred or contacted pt or clinic.    Dressing  Lotion, vaseline, xeroform dorsal foot, silver hydro distal LE, foam, 1/2" foam, #7 lymph netting, 4X4, ABD,  kerlix, netting #5  PATIENT EDUCATION: Education details: 11/21/22, POC, changing dressing if soiled; EVAL Patient educated on exam findings, POC, scope of PT, HEP, and keeping dressing on unless soiled or panful. Person educated: Patient Education method: Explanation, Demonstration, and Handouts Education comprehension: verbalized understanding, returned demonstration, verbal cues required, and tactile cues required   HOME EXERCISE PROGRAM: N/a   GOALS: Goals reviewed with patient? Yes  SHORT TERM GOALS: Target date: 10/25/2022    Wound to be free from slough to demonstrate healing.  Baseline: Goal status: IN PROGRESS   LONG TERM GOALS: Target date: 11/15/2022    Patient wound to be healed to reduce risk of infection. Baseline:  Goal status: IN PROGRESS  2.  Patient will state importance of compression garment use and be able to don to reduce risk of further wound  development.  Baseline:  Goal status: IN PROGRESS   ASSESSMENT:  CLINICAL IMPRESSION: See above   OBJECTIVE IMPAIRMENTS: Abnormal gait, decreased activity tolerance, decreased balance, decreased mobility, difficulty walking, impaired sensation, and improper body mechanics.   ACTIVITY LIMITATIONS: carrying, lifting, standing, squatting, stairs, transfers, locomotion level, and caring for others  PARTICIPATION LIMITATIONS: meal prep, cleaning, laundry, shopping, community activity, and yard work  PERSONAL FACTORS: 3+ comorbidities: DM, CKD, HLD, HTN  are also affecting patient's functional outcome.   REHAB POTENTIAL: Good  CLINICAL DECISION MAKING: Evolving/moderate complexity  EVALUATION COMPLEXITY: Moderate  PLAN: PT FREQUENCY: 2x/week  PT DURATION: 4 weeks  PLANNED INTERVENTIONS: Therapeutic exercises, Therapeutic activity, Neuromuscular re-education, Balance training, Gait training, Patient/Family education, Joint manipulation, Joint mobilization, Stair training, Orthotic/Fit training, DME instructions, Aquatic Therapy, Dry Needling, Electrical stimulation, Spinal manipulation, Spinal mobilization, Cryotherapy, Moist heat, Compression bandaging, scar mobilization, Splintting, Taping, Traction, Ultrasound, Ionotophoresis '4mg'$ /ml Dexamethasone, and Manual therapy   PLAN FOR NEXT SESSION: debride and dressing changes PRN; possibly f/u with MD regarding vascular consult/ABI?    5:17 PM, 11/29/22 Teena Irani, PTA/CLT [place] Ph: [phone]

## 2022-11-29 NOTE — Telephone Encounter (Signed)
Called DR Hall's office and left message with nurse regarding concerns for wound and circulation.  Requested vascular consult and ABI to determine arterial involvement.  Teena Irani, PTA/CLT [place] Ph: [phone]

## 2022-12-04 ENCOUNTER — Encounter (HOSPITAL_COMMUNITY): Payer: Self-pay

## 2022-12-04 ENCOUNTER — Ambulatory Visit (HOSPITAL_COMMUNITY)
Admission: RE | Admit: 2022-12-04 | Discharge: 2022-12-04 | Disposition: A | Discharge status: Home / Self Care | Payer: Medicare HMO | Source: Ambulatory Visit | Attending: Hematology | Admitting: Hematology

## 2022-12-04 ENCOUNTER — Inpatient Hospital Stay: Payer: Medicare HMO | Attending: Hematology

## 2022-12-04 DIAGNOSIS — N189 Chronic kidney disease, unspecified: Secondary | ICD-10-CM | POA: Insufficient documentation

## 2022-12-04 DIAGNOSIS — Z794 Long term (current) use of insulin: Secondary | ICD-10-CM | POA: Diagnosis not present

## 2022-12-04 DIAGNOSIS — R928 Other abnormal and inconclusive findings on diagnostic imaging of breast: Secondary | ICD-10-CM | POA: Diagnosis not present

## 2022-12-04 DIAGNOSIS — C50911 Malignant neoplasm of unspecified site of right female breast: Secondary | ICD-10-CM

## 2022-12-04 DIAGNOSIS — D631 Anemia in chronic kidney disease: Secondary | ICD-10-CM | POA: Diagnosis not present

## 2022-12-04 DIAGNOSIS — E1122 Type 2 diabetes mellitus with diabetic chronic kidney disease: Secondary | ICD-10-CM | POA: Diagnosis not present

## 2022-12-04 DIAGNOSIS — C50311 Malignant neoplasm of lower-inner quadrant of right female breast: Secondary | ICD-10-CM | POA: Diagnosis present

## 2022-12-04 DIAGNOSIS — Z79811 Long term (current) use of aromatase inhibitors: Secondary | ICD-10-CM | POA: Insufficient documentation

## 2022-12-04 DIAGNOSIS — M8580 Other specified disorders of bone density and structure, unspecified site: Secondary | ICD-10-CM | POA: Insufficient documentation

## 2022-12-04 DIAGNOSIS — D509 Iron deficiency anemia, unspecified: Secondary | ICD-10-CM | POA: Insufficient documentation

## 2022-12-04 DIAGNOSIS — I129 Hypertensive chronic kidney disease with stage 1 through stage 4 chronic kidney disease, or unspecified chronic kidney disease: Secondary | ICD-10-CM | POA: Diagnosis not present

## 2022-12-04 DIAGNOSIS — Z170 Estrogen receptor positive status [ER+]: Secondary | ICD-10-CM | POA: Insufficient documentation

## 2022-12-04 LAB — COMPREHENSIVE METABOLIC PANEL
ALT: 11 U/L (ref 0–44)
AST: 16 U/L (ref 15–41)
Albumin: 3.6 g/dL (ref 3.5–5.0)
Alkaline Phosphatase: 79 U/L (ref 38–126)
Anion gap: 10 (ref 5–15)
BUN: 39 mg/dL — ABNORMAL HIGH (ref 8–23)
CO2: 22 mmol/L (ref 22–32)
Calcium: 9.5 mg/dL (ref 8.9–10.3)
Chloride: 103 mmol/L (ref 98–111)
Creatinine, Ser: 1.46 mg/dL — ABNORMAL HIGH (ref 0.44–1.00)
GFR, Estimated: 36 mL/min — ABNORMAL LOW (ref >60)
Glucose, Bld: 193 mg/dL — ABNORMAL HIGH (ref 70–99)
Potassium: 4.9 mmol/L (ref 3.5–5.1)
Sodium: 135 mmol/L (ref 135–145)
Total Bilirubin: 0.7 mg/dL (ref 0.3–1.2)
Total Protein: 7.7 g/dL (ref 6.5–8.1)

## 2022-12-04 LAB — CBC WITH DIFFERENTIAL/PLATELET
Abs Immature Granulocytes: 0.05 10*3/uL (ref 0.00–0.07)
Basophils Absolute: 0.1 10*3/uL (ref 0.0–0.1)
Basophils Relative: 1 %
Eosinophils Absolute: 0.7 10*3/uL — ABNORMAL HIGH (ref 0.0–0.5)
Eosinophils Relative: 7 %
HCT: 35.2 % — ABNORMAL LOW (ref 36.0–46.0)
Hemoglobin: 11.2 g/dL — ABNORMAL LOW (ref 12.0–15.0)
Immature Granulocytes: 1 %
Lymphocytes Relative: 10 %
Lymphs Abs: 1 10*3/uL (ref 0.7–4.0)
MCH: 28.8 pg (ref 26.0–34.0)
MCHC: 31.8 g/dL (ref 30.0–36.0)
MCV: 90.5 fL (ref 80.0–100.0)
Monocytes Absolute: 0.7 10*3/uL (ref 0.1–1.0)
Monocytes Relative: 7 %
Neutro Abs: 7.5 10*3/uL (ref 1.7–7.7)
Neutrophils Relative %: 74 %
Platelets: 447 10*3/uL — ABNORMAL HIGH (ref 150–400)
RBC: 3.89 MIL/uL (ref 3.87–5.11)
RDW: 12.9 % (ref 11.5–15.5)
WBC: 10 10*3/uL (ref 4.0–10.5)
nRBC: 0 % (ref 0.0–0.2)

## 2022-12-04 LAB — MAGNESIUM: Magnesium: 1.9 mg/dL (ref 1.7–2.4)

## 2022-12-05 ENCOUNTER — Ambulatory Visit (HOSPITAL_COMMUNITY): Payer: Medicare HMO | Admitting: Physical Therapy

## 2022-12-05 DIAGNOSIS — E11621 Type 2 diabetes mellitus with foot ulcer: Secondary | ICD-10-CM | POA: Diagnosis not present

## 2022-12-05 DIAGNOSIS — R2689 Other abnormalities of gait and mobility: Secondary | ICD-10-CM | POA: Diagnosis not present

## 2022-12-05 DIAGNOSIS — L97519 Non-pressure chronic ulcer of other part of right foot with unspecified severity: Secondary | ICD-10-CM | POA: Diagnosis not present

## 2022-12-05 LAB — MISC LABCORP TEST (SEND OUT): Labcorp test code: 81950

## 2022-12-05 NOTE — Therapy (Signed)
OUTPATIENT PHYSICAL THERAPY Wound Treatment Patient Name: Krista Payne MRN: [id number] DOB:[birth year], 82 y.o., female Today's Date: 12/05/2022   PCP: Celene Squibb, MD REFERRING PROVIDER: Celene Squibb, MD  END OF SESSION:   PT End of Session - 12/05/22 1628     Visit Number 11    Number of Visits 20    Date for PT Re-Evaluation 12/19/22    Authorization Type Primary: Aetna Medicare Secondary: Tricare (no auth, no VL)    Progress Note Due on Visit 19    PT Start Time 1120    PT Stop Time 1155    PT Time Calculation (min) 35 min    Activity Tolerance Patient tolerated treatment well    Behavior During Therapy WFL for tasks assessed/performed                       Past Medical History:  Diagnosis Date   Breast cancer ([place])    R   Chronic kidney disease    Diabetes mellitus without complication ([place])    Hyperlipidemia    Hypertension    Macular degeneration    Osteoarthritis    Past Surgical History:  Procedure Laterality Date   CATARACT EXTRACTION W/PHACO Right 02/07/2015   Procedure: CATARACT EXTRACTION PHACO AND INTRAOCULAR LENS PLACEMENT ([place]);  Surgeon: Tonny Branch, MD;  Location: [facility];  Service: Ophthalmology;  Laterality: Right;  CDE:12.72   CATARACT EXTRACTION W/PHACO Left 02/24/2015   Procedure: CATARACT EXTRACTION PHACO AND INTRAOCULAR LENS PLACEMENT LEFT EYE;  Surgeon: Tonny Branch, MD;  Location: [facility];  Service: Ophthalmology;  Laterality: Left;  CDE 11.69   COLONOSCOPY     Patient Active Problem List   Diagnosis Date Noted   Unspecified Escherichia coli (E. coli) as the cause of diseases classified elsewhere 02/23/2022   Sepsis ([place]) 02/22/2022   Acute kidney failure ([place]) 02/22/2022   Fall at home, initial encounter 02/22/2022   Personal history of COVID-19 02/02/2022   Pressure injury of skin 11/15/2021   Depression, unspecified 11/15/2021   Hypertensive heart and chronic kidney disease with heart failure and stage 1 through stage 4 chronic  kidney disease, or unspecified chronic kidney disease ([place]) 11/15/2021   Insomnia 11/15/2021   Muscle weakness (generalized) 11/15/2021   Other abnormalities of gait and mobility 11/15/2021   Other forms of acute ischemic heart disease 11/15/2021   Traumatic ischemia of muscle ([place]) 51/70/0174   Diastolic heart failure ([place]) 11/15/2021   Diabetic foot infection ([place]) 94/49/6759   Acute metabolic encephalopathy 16/38/4665   Chronic kidney disease, stage 3b ([place]) 11/14/2021   Type 2 diabetes with nephropathy ([place]) 11/14/2021   Elevated troponin 11/13/2021   Lactic acidosis 11/13/2021   COVID-19 virus infection 11/13/2021   Hyperglycemia due to diabetes mellitus ([place]) 11/13/2021   Ankle wound, right, initial encounter 11/13/2021   Altered mental status 11/13/2021   Dehydration 11/13/2021   Long term (current) use of oral hypoglycemic drugs 10/29/2021   Unspecified macular degeneration 10/29/2021   Chronic kidney disease 05/17/2021   Personal history of malignant neoplasm of breast 10/29/2020   Malignant neoplasm of unspecified site of right female breast ([place]) 07/16/2019   URI (upper respiratory infection) 11/20/2011   Other seasonal allergic rhinitis 11/23/2010   Urinary tract infection without hematuria 11/23/2010   RENAL INSUFFICIENCY 10/26/2010   OSTEOARTHRITIS 10/18/2010   Osteoarthritis 10/18/2010   Vitamin D deficiency 08/10/2009   Hyperlipidemia 08/10/2009   Body mass index (BMI) 39.0-39.9, adult 08/10/2009   EDEMA  01/20/2008   Anemia in chronic kidney disease 06/19/2007   Hypertension 06/02/2007    ONSET DATE: 07/05/22  REFERRING DIAG: ulcer of right foot due to type 2 diabetes mellitus  THERAPY DIAG:  No diagnosis found.  Rationale for Evaluation and Treatment: Rehabilitation     Wound Therapy - 12/05/22 1537     Subjective pt states she got an appt with a vascular MD for next week.    Patient and Family Stated Goals wound to heal    Date of Onset 07/05/22     Prior Treatments cream    Evaluation and Treatment Procedures Explained to Patient/Family Yes    Evaluation and Treatment Procedures agreed to    Wound Properties Date First Assessed: 10/04/22 Time First Assessed: 1345 Wound Type: Venous stasis ulcer Location: Pretibial Location Orientation: Distal;Right Wound Description (Comments): medial ankle wound superior Present on Admission: Yes   Dressing Type Impregnated gauze (bismuth);Gauze (Comment)   vaseline,  xeroform, extra cotton, kerlix, netting #5   Dressing Changed Changed    Dressing Status Old drainage    Dressing Change Frequency PRN    Site / Wound Assessment Yellow;Pink    % Wound base Red or Granulating 90%    % Wound base Yellow/Fibrinous Exudate 10%    Peri-wound Assessment Erythema (non-blanchable)    Selective Debridement (non-excisional) - Location Rt  LE  wound base and peeling skin throughout RLE    Selective Debridement (non-excisional) - Tools Used Forceps    Selective Debridement (non-excisional) - Tissue Removed slough and dry skin    Wound Therapy - Clinical Statement see below    Wound Therapy - Functional Problem List bathing, dressing    Factors Delaying/Impairing Wound Healing Altered sensation;Diabetes Mellitus;Vascular compromise;Multiple medical problems    Hydrotherapy Plan Debridement;Dressing change;Patient/family education    Wound Therapy - Frequency 2X / week    Wound Therapy - Current Recommendations PT    Wound Plan continue with appropriate dressings, no compression as appears to have mixed venous/arterial deficit.  Follow up if MD has referred or contacted pt or clinic.    Dressing  Lotion, vaseline,silver hydro distal LE, foam, 1/2" foam, #7 lymph netting, 4X4, ABD,  kerlix, netting #5                  PATIENT EDUCATION: Education details: 11/21/22, POC, changing dressing if soiled; EVAL Patient educated on exam findings, POC, scope of PT, HEP, and keeping dressing on unless soiled or  panful. Person educated: Patient Education method: Explanation, Demonstration, and Handouts Education comprehension: verbalized understanding, returned demonstration, verbal cues required, and tactile cues required   HOME EXERCISE PROGRAM: N/a   GOALS: Goals reviewed with patient? Yes  SHORT TERM GOALS: Target date: 10/25/2022    Wound to be free from slough to demonstrate healing.  Baseline: Goal status: IN PROGRESS   LONG TERM GOALS: Target date: 11/15/2022    Patient wound to be healed to reduce risk of infection. Baseline:  Goal status: IN PROGRESS  2.  Patient will state importance of compression garment use and be able to don to reduce risk of further wound development.  Baseline:  Goal status: IN PROGRESS   ASSESSMENT:  CLINICAL IMPRESSION: Pt  comes today with noted wetness around bottom of pant leg on Rt and same shoes on pushing up dressing.  Reminded again to get a larger shoe for her Rt LE and to not shower/get dressings wet on Rt LE.  Pt verbalized understanding.  LE appears much better  once dressing removed with improved shaped, however still with stagnant drainage that appears to be copious in spots with maceration.  Dorsal foot much improved with overall less devitalized tissue present and improved skin integrity. Cleansed well with removal of dry tissue. Only used silver hydrofiber around distal LE due to persistent drainage and maceration present.  Extra ABD added and continued with 1/2" foam to anterior/posterior aspect to prevent slippage. PT reported overall comfort.   OBJECTIVE IMPAIRMENTS: Abnormal gait, decreased activity tolerance, decreased balance, decreased mobility, difficulty walking, impaired sensation, and improper body mechanics.   ACTIVITY LIMITATIONS: carrying, lifting, standing, squatting, stairs, transfers, locomotion level, and caring for others  PARTICIPATION LIMITATIONS: meal prep, cleaning, laundry, shopping, community activity, and  yard work  PERSONAL FACTORS: 3+ comorbidities: DM, CKD, HLD, HTN  are also affecting patient's functional outcome.   REHAB POTENTIAL: Good  CLINICAL DECISION MAKING: Evolving/moderate complexity  EVALUATION COMPLEXITY: Moderate  PLAN: PT FREQUENCY: 2x/week  PT DURATION: 4 weeks  PLANNED INTERVENTIONS: Therapeutic exercises, Therapeutic activity, Neuromuscular re-education, Balance training, Gait training, Patient/Family education, Joint manipulation, Joint mobilization, Stair training, Orthotic/Fit training, DME instructions, Aquatic Therapy, Dry Needling, Electrical stimulation, Spinal manipulation, Spinal mobilization, Cryotherapy, Moist heat, Compression bandaging, scar mobilization, Splintting, Taping, Traction, Ultrasound, Ionotophoresis '4mg'$ /ml Dexamethasone, and Manual therapy   PLAN FOR NEXT SESSION: debride and dressing changes PRN.  Follow up with ; possibly f/u with results from vascular appt next week.   4:28 PM, 12/05/22 Teena Irani, PTA/CLT [place] Ph: [phone]

## 2022-12-10 ENCOUNTER — Encounter (HOSPITAL_COMMUNITY): Payer: Self-pay | Admitting: Physical Therapy

## 2022-12-10 ENCOUNTER — Ambulatory Visit (HOSPITAL_COMMUNITY): Payer: Medicare HMO | Admitting: Physical Therapy

## 2022-12-10 DIAGNOSIS — E1165 Type 2 diabetes mellitus with hyperglycemia: Secondary | ICD-10-CM | POA: Diagnosis not present

## 2022-12-10 DIAGNOSIS — E11621 Type 2 diabetes mellitus with foot ulcer: Secondary | ICD-10-CM

## 2022-12-10 DIAGNOSIS — R2689 Other abnormalities of gait and mobility: Secondary | ICD-10-CM | POA: Diagnosis not present

## 2022-12-10 DIAGNOSIS — L97519 Non-pressure chronic ulcer of other part of right foot with unspecified severity: Secondary | ICD-10-CM | POA: Diagnosis not present

## 2022-12-10 NOTE — Therapy (Signed)
OUTPATIENT PHYSICAL THERAPY Wound Treatment Patient Name: PHYNIX WACHOWIAK MRN: [id number] DOB:10-Aug-1941, 82 y.o., female Today's Date: 12/10/2022   PCP: Celene Squibb, MD REFERRING PROVIDER: Celene Squibb, MD  END OF SESSION:   PT End of Session - 12/10/22 1304     Visit Number 12    Number of Visits 20    Date for PT Re-Evaluation 12/19/22    Authorization Type Primary: Aetna Medicare Secondary: Tricare (no auth, no VL)    Progress Note Due on Visit 19    PT Start Time 1304    PT Stop Time 1339    PT Time Calculation (min) 35 min    Activity Tolerance Patient tolerated treatment well    Behavior During Therapy WFL for tasks assessed/performed                       Past Medical History:  Diagnosis Date   Breast cancer ([place])    R   Chronic kidney disease    Diabetes mellitus without complication ([place])    Hyperlipidemia    Hypertension    Macular degeneration    Osteoarthritis    Past Surgical History:  Procedure Laterality Date   CATARACT EXTRACTION W/PHACO Right 02/07/2015   Procedure: CATARACT EXTRACTION PHACO AND INTRAOCULAR LENS PLACEMENT ([place]);  Surgeon: Tonny Branch, MD;  Location: [facility];  Service: Ophthalmology;  Laterality: Right;  CDE:12.72   CATARACT EXTRACTION W/PHACO Left 02/24/2015   Procedure: CATARACT EXTRACTION PHACO AND INTRAOCULAR LENS PLACEMENT LEFT EYE;  Surgeon: Tonny Branch, MD;  Location: [facility];  Service: Ophthalmology;  Laterality: Left;  CDE 11.69   COLONOSCOPY     Patient Active Problem List   Diagnosis Date Noted   Unspecified Escherichia coli (E. coli) as the cause of diseases classified elsewhere 02/23/2022   Sepsis ([place]) 02/22/2022   Acute kidney failure ([place]) 02/22/2022   Fall at home, initial encounter 02/22/2022   Personal history of COVID-19 02/02/2022   Pressure injury of skin 11/15/2021   Depression, unspecified 11/15/2021   Hypertensive heart and chronic kidney disease with heart failure and stage 1 through stage 4 chronic  kidney disease, or unspecified chronic kidney disease ([place]) 11/15/2021   Insomnia 11/15/2021   Muscle weakness (generalized) 11/15/2021   Other abnormalities of gait and mobility 11/15/2021   Other forms of acute ischemic heart disease 11/15/2021   Traumatic ischemia of muscle ([place]) [id number]   Diastolic heart failure ([place]) 11/15/2021   Diabetic foot infection ([place]) [id number]   Acute metabolic encephalopathy [id number]   Chronic kidney disease, stage 3b ([place]) 11/14/2021   Type 2 diabetes with nephropathy ([place]) 11/14/2021   Elevated troponin 11/13/2021   Lactic acidosis 11/13/2021   COVID-19 virus infection 11/13/2021   Hyperglycemia due to diabetes mellitus ([place]) 11/13/2021   Ankle wound, right, initial encounter 11/13/2021   Altered mental status 11/13/2021   Dehydration 11/13/2021   Long term (current) use of oral hypoglycemic drugs 10/29/2021   Unspecified macular degeneration 10/29/2021   Chronic kidney disease 05/17/2021   Personal history of malignant neoplasm of breast 10/29/2020   Malignant neoplasm of unspecified site of right female breast ([place]) 07/16/2019   URI (upper respiratory infection) 11/20/2011   Other seasonal allergic rhinitis 11/23/2010   Urinary tract infection without hematuria 11/23/2010   RENAL INSUFFICIENCY 10/26/2010   OSTEOARTHRITIS 10/18/2010   Osteoarthritis 10/18/2010   Vitamin D deficiency 08/10/2009   Hyperlipidemia 08/10/2009   Body mass index (BMI) 39.0-39.9, adult 08/10/2009   EDEMA  01/20/2008   Anemia in chronic kidney disease 06/19/2007   Hypertension 06/02/2007    ONSET DATE: 07/05/22  REFERRING DIAG: ulcer of right foot due to type 2 diabetes mellitus  THERAPY DIAG:  Ulcer of right foot due to type 2 diabetes mellitus ([place])  Other abnormalities of gait and mobility  Rationale for Evaluation and Treatment: Rehabilitation     Wound Therapy - 12/10/22 0001     Subjective pt states she got an appt with a vascular MD for this week.  she took a shower before coming today    Patient and Family Stated Goals wound to heal    Date of Onset 07/05/22    Prior Treatments cream    Evaluation and Treatment Procedures Explained to Patient/Family Yes    Evaluation and Treatment Procedures agreed to    Wound Properties Date First Assessed: 10/04/22 Time First Assessed: 1345 Wound Type: Venous stasis ulcer Location: Pretibial Location Orientation: Distal;Right Wound Description (Comments): medial ankle wound superior Present on Admission: Yes   Dressing Type Impregnated gauze (bismuth);Gauze (Comment)   vaseline,  xeroform, extra cotton, kerlix, netting #5   Dressing Changed Changed    Dressing Status Old drainage    Dressing Change Frequency PRN    Site / Wound Assessment Yellow;Pink    % Wound base Red or Granulating 90%    % Wound base Yellow/Fibrinous Exudate 10%    Peri-wound Assessment Erythema (non-blanchable)    Drainage Amount Moderate    Drainage Description Serous    Treatment Cleansed    Selective Debridement (non-excisional) - Location Rt  LE  wound base and peeling skin throughout RLE    Selective Debridement (non-excisional) - Tools Used Forceps    Selective Debridement (non-excisional) - Tissue Removed slough and dry skin    Wound Therapy - Clinical Statement Patient arrives with dressing intact but rolling up her foot due to continued improper footware and  soiled and soaked with drainage and water from taking a shower prior to session without proper covering of dressing. Patient with contined redness and weeping from RLE with peeling trhoughout. Main wound area appears to be healed but overall poor skin integrity throughout. Patient education on sponge bathing vs showering. Continued with same dressing as last time to try to manage weeping with hopes that vascular appointment may be able to assist with further treatment.    Wound Therapy - Functional Problem List bathing, dressing    Factors Delaying/Impairing Wound  Healing Altered sensation;Diabetes Mellitus;Vascular compromise;Multiple medical problems    Hydrotherapy Plan Debridement;Dressing change;Patient/family education    Wound Therapy - Frequency 2X / week    Wound Therapy - Current Recommendations PT    Wound Plan continue with appropriate dressings, no compression as appears to have mixed venous/arterial deficit.  Follow up if MD has referred or contacted pt or clinic.    Dressing  Lotion, vaseline,silver hydro distal LE, foam, 1/2" foam, #7 lymph netting, 4X4, ABD,  kerlix, netting #5                  PATIENT EDUCATION: Education details: 11/21/22, POC, changing dressing if soiled; EVAL Patient educated on exam findings, POC, scope of PT, HEP, and keeping dressing on unless soiled or panful. Person educated: Patient Education method: Explanation, Demonstration, and Handouts Education comprehension: verbalized understanding, returned demonstration, verbal cues required, and tactile cues required   HOME EXERCISE PROGRAM: N/a   GOALS: Goals reviewed with patient? Yes  SHORT TERM GOALS: Target date: 10/25/2022  Wound to be free from slough to demonstrate healing.  Baseline: Goal status: IN PROGRESS   LONG TERM GOALS: Target date: 11/15/2022    Patient wound to be healed to reduce risk of infection. Baseline:  Goal status: IN PROGRESS  2.  Patient will state importance of compression garment use and be able to don to reduce risk of further wound development.  Baseline:  Goal status: IN PROGRESS   ASSESSMENT:  CLINICAL IMPRESSION: See above  OBJECTIVE IMPAIRMENTS: Abnormal gait, decreased activity tolerance, decreased balance, decreased mobility, difficulty walking, impaired sensation, and improper body mechanics.   ACTIVITY LIMITATIONS: carrying, lifting, standing, squatting, stairs, transfers, locomotion level, and caring for others  PARTICIPATION LIMITATIONS: meal prep, cleaning, laundry, shopping, community  activity, and yard work  PERSONAL FACTORS: 3+ comorbidities: DM, CKD, HLD, HTN  are also affecting patient's functional outcome.   REHAB POTENTIAL: Good  CLINICAL DECISION MAKING: Evolving/moderate complexity  EVALUATION COMPLEXITY: Moderate  PLAN: PT FREQUENCY: 2x/week  PT DURATION: 4 weeks  PLANNED INTERVENTIONS: Therapeutic exercises, Therapeutic activity, Neuromuscular re-education, Balance training, Gait training, Patient/Family education, Joint manipulation, Joint mobilization, Stair training, Orthotic/Fit training, DME instructions, Aquatic Therapy, Dry Needling, Electrical stimulation, Spinal manipulation, Spinal mobilization, Cryotherapy, Moist heat, Compression bandaging, scar mobilization, Splintting, Taping, Traction, Ultrasound, Ionotophoresis 68m/ml Dexamethasone, and Manual therapy   PLAN FOR NEXT SESSION: debride and dressing changes PRN.  Follow up with ; possibly f/u with results from vascular appt next week.  1:48 PM, 12/10/22 AMearl LatinPT, DPT Physical Therap[facility]

## 2022-12-11 ENCOUNTER — Other Ambulatory Visit: Payer: Self-pay

## 2022-12-11 ENCOUNTER — Other Ambulatory Visit (HOSPITAL_COMMUNITY): Payer: Self-pay | Admitting: Hematology

## 2022-12-11 ENCOUNTER — Inpatient Hospital Stay (HOSPITAL_BASED_OUTPATIENT_CLINIC_OR_DEPARTMENT_OTHER): Payer: Medicare HMO | Admitting: Hematology

## 2022-12-11 VITALS — BP 180/99 | HR 66 | Temp 98.8°F | Resp 18 | Ht 63.0 in | Wt 211.4 lb

## 2022-12-11 DIAGNOSIS — D508 Other iron deficiency anemias: Secondary | ICD-10-CM

## 2022-12-11 DIAGNOSIS — C50911 Malignant neoplasm of unspecified site of right female breast: Secondary | ICD-10-CM | POA: Diagnosis not present

## 2022-12-11 DIAGNOSIS — I129 Hypertensive chronic kidney disease with stage 1 through stage 4 chronic kidney disease, or unspecified chronic kidney disease: Secondary | ICD-10-CM | POA: Diagnosis not present

## 2022-12-11 DIAGNOSIS — Z79811 Long term (current) use of aromatase inhibitors: Secondary | ICD-10-CM | POA: Diagnosis not present

## 2022-12-11 DIAGNOSIS — Z170 Estrogen receptor positive status [ER+]: Secondary | ICD-10-CM | POA: Diagnosis not present

## 2022-12-11 DIAGNOSIS — Z853 Personal history of malignant neoplasm of breast: Secondary | ICD-10-CM

## 2022-12-11 DIAGNOSIS — D509 Iron deficiency anemia, unspecified: Secondary | ICD-10-CM | POA: Diagnosis not present

## 2022-12-11 DIAGNOSIS — E1122 Type 2 diabetes mellitus with diabetic chronic kidney disease: Secondary | ICD-10-CM | POA: Diagnosis not present

## 2022-12-11 DIAGNOSIS — D631 Anemia in chronic kidney disease: Secondary | ICD-10-CM | POA: Diagnosis not present

## 2022-12-11 DIAGNOSIS — N189 Chronic kidney disease, unspecified: Secondary | ICD-10-CM | POA: Diagnosis not present

## 2022-12-11 DIAGNOSIS — E559 Vitamin D deficiency, unspecified: Secondary | ICD-10-CM

## 2022-12-11 DIAGNOSIS — M8580 Other specified disorders of bone density and structure, unspecified site: Secondary | ICD-10-CM | POA: Diagnosis not present

## 2022-12-11 DIAGNOSIS — Z794 Long term (current) use of insulin: Secondary | ICD-10-CM | POA: Diagnosis not present

## 2022-12-11 DIAGNOSIS — C50311 Malignant neoplasm of lower-inner quadrant of right female breast: Secondary | ICD-10-CM | POA: Diagnosis not present

## 2022-12-11 DIAGNOSIS — T148XXD Other injury of unspecified body region, subsequent encounter: Secondary | ICD-10-CM

## 2022-12-11 NOTE — Patient Instructions (Addendum)
[place] at [facility] Discharge Instructions   You were seen and examined today by Dr. Delton Coombes.  He reviewed the results of your lab work which is mostly normal/stable. Your vitamin D is low at 26. You should start taking 400 units of vitamin D daily. You may get this over-the-counter.   He reviewed the results of your mammogram which are normal/stable.   Continue anastrozole as prescribed. You have 3 refills left on this medication until October 2024.   Return as scheduled.      Thank you for choosing [place] at [facility] to provide your oncology and hematology care.  To afford each patient quality time with our provider, please arrive at least 15 minutes before your scheduled appointment time.   If you have a lab appointment with the [facility] please come in thru the Main Entrance and check in at the main information desk.  You need to re-schedule your appointment should you arrive 10 or more minutes late.  We strive to give you quality time with our providers, and arriving late affects you and other patients whose appointments are after yours.  Also, if you no show three or more times for appointments you may be dismissed from the clinic at the providers discretion.     Again, thank you for choosing [facility].  Our hope is that these requests will decrease the amount of time that you wait before being seen by our physicians.       _____________________________________________________________  Should you have questions after your visit to [facility], please contact our office at [phone] and follow the prompts.  Our office hours are 8:00 a.m. and 4:30 p.m. Monday - Friday.  Please note that voicemails left after 4:00 p.m. may not be returned until the following business day.  We are closed weekends and major holidays.  You do have access to a nurse 24-7, just call the main number to the clinic  [phone] and do not press any options, hold on the line and a nurse will answer the phone.    For prescription refill requests, have your pharmacy contact our office and allow 72 hours.    Due to Covid, you will need to wear a mask upon entering the hospital. If you do not have a mask, a mask will be given to you at the Main Entrance upon arrival. For doctor visits, patients may have 1 support person age 47 or older with them. For treatment visits, patients can not have anyone with them due to social distancing guidelines and our immunocompromised population.

## 2022-12-11 NOTE — Progress Notes (Signed)
[facility] [address], [place] [address]    Clinic Day:  12/11/2022  Referring physician: Celene Squibb, MD  Patient Care Team: Celene Squibb, MD as PCP - General (Internal Medicine)   ASSESSMENT & PLAN:   Assessment: 1.  Right breast invasive lobular carcinoma: -Stereotactic biopsy on 06/18/2019 showed ILC in the right breast lower inner quadrant.  90% positive for ER/PR.  Ki-67 was 5%.  Tumor negative for HER-2. -She refused surgical intervention. -MRI of the breast on 08/24/2019 showed biopsy-proven right breast invasive lobular carcinoma and atypical lobular hyperplasia.  No additional suspicious enhancements. -She was started on anastrozole on 08/24/2019. -MRI of the breast on 02/19/2020 showed treatment response with enhancement at the site of atypical lobular hyperplasia in the upper inner quadrant of the right breast measuring 0.9 x 0.5 cm (previously 1.3 x 0.9 cm).  Enhancement at the site of known invasive lobular carcinoma in the lower inner quadrant of the right breast has resolved completely.  Left breast remains negative.  No adenopathy. -MRI of the breast on 08/29/2020 shows non-mass enhancement involving the lower outer quadrant at anterior depth spanning 9 x 8 x 3 mm demonstrating progressive enhancement kinetics, new since prior MRI in April.  No mammographic correlate.  No abnormal enhancement at the clip biopsy site.  Left breast has no lesions.   2.  Osteopenia: -DEXA scan on 07/20/2019 shows T score of -1.9.   3.  CKD: -Creatinine is stable around 1.4.  Plan: 1.  Right breast invasive lobular carcinoma: - She is tolerating anastrozole 1 mg tablet daily.  Denies any hot flashes or musculoskeletal symptoms. - No palpable adenopathy in the axilla and supraclavicular region. - We reviewed mammogram diagnostic bilateral from 12/04/2022 which did not show any associated masses or suspicious abnormalities at the site of biopsy-proven ILC and ALH of  the right breast.  No abnormality in the left breast. - Continue anastrozole as definitive treatment-she refused surgery. - RTC 6 months for follow-up of with repeat right breast diagnostic mammogram and exam.   2.  Osteopenia: - Vitamin D level is 26.  She was told to start taking vitamin D 400 units daily.   3.  Normocytic anemia: - Mild anemia with hemoglobin 11.2.  Will check ferritin and iron panel at next visit.  Anemia from combination of CKD and iron deficiency.     Breast Cancer therapy associated bone loss: I have recommended calcium, Vitamin D and weight bearing exercises.    Orders Placed This Encounter  Procedures   MM DIAG BREAST TOMO UNI RIGHT    Standing Status:   Future    Standing Expiration Date:   12/12/2023    Order Specific Question:   Reason for Exam (SYMPTOM  OR DIAGNOSIS REQUIRED)    Answer:   right breast cancer    Order Specific Question:   Preferred imaging location?    Answer:   [facility]   CBC with Differential    Standing Status:   Future    Standing Expiration Date:   12/11/2023   Comprehensive metabolic panel    Standing Status:   Future    Standing Expiration Date:   12/11/2023   VITAMIN D 25 Hydroxy (Vit-D Deficiency, Fractures)    Standing Status:   Future    Standing Expiration Date:   12/12/2023   Ferritin    Standing Status:   Future    Standing Expiration Date:   12/11/2023  Iron and TIBC ([facility] ONLY)    Standing Status:   Future    Standing Expiration Date:   12/12/2023      Kaleen Odea as a scribe for Derek Jack, MD.,have documented all relevant documentation on the behalf of Derek Jack, MD,as directed by  Derek Jack, MD while in the presence of Derek Jack, MD.   I, Derek Jack MD, have reviewed the above documentation for accuracy and completeness, and I agree with the above.   Doyce Loose   2/13/202411:33 AM  CHIEF COMPLAINT:    Diagnosis:  right breast cancer     Cancer Staging  No matching staging information was found for the patient.   Prior Therapy: None  Current Therapy:  Anastrozole   HISTORY OF PRESENT ILLNESS:   Oncology History   No history exists.     INTERVAL HISTORY:   Krista Payne is a 82 y.o. female presenting to clinic today for follow up of  right breast cancer. She was last seen by me on 08/27/2022.  Today, she states that she is doing well overall. Her appetite level is at 90%. Her energy level is at 80%. She denies any problems since last visit. He denies hot flashes and aches/pains. She is tolerating the Anastrozole well and denies any problems with this medicine. She is currently not taking vitamin D3. She is able to do all her daily activities.  She has had a wound/swelling on right ankle x6 weeks- she is followed by wound clinic for this.  PAST MEDICAL HISTORY:   Past Medical History: Past Medical History:  Diagnosis Date   Breast cancer ([place])    R   Chronic kidney disease    Diabetes mellitus without complication ([place])    Hyperlipidemia    Hypertension    Macular degeneration    Osteoarthritis     Surgical History: Past Surgical History:  Procedure Laterality Date   CATARACT EXTRACTION W/PHACO Right 02/07/2015   Procedure: CATARACT EXTRACTION PHACO AND INTRAOCULAR LENS PLACEMENT ([place]);  Surgeon: Tonny Branch, MD;  Location: [facility];  Service: Ophthalmology;  Laterality: Right;  CDE:12.72   CATARACT EXTRACTION W/PHACO Left 02/24/2015   Procedure: CATARACT EXTRACTION PHACO AND INTRAOCULAR LENS PLACEMENT LEFT EYE;  Surgeon: Tonny Branch, MD;  Location: [facility];  Service: Ophthalmology;  Laterality: Left;  CDE 11.69   COLONOSCOPY      Social History: Social History   Socioeconomic History   Marital status: Widowed    Spouse name: Not on file   Number of children: 3   Years of education: 55   Highest education level: 12th grade  Occupational History   Not on file  Tobacco  Use   Smoking status: Never    Passive exposure: Never   Smokeless tobacco: Never  Vaping Use   Vaping Use: Never used  Substance and Sexual Activity   Alcohol use: No   Drug use: No   Sexual activity: Not Currently    Partners: Male  Other Topics Concern   Not on file  Social History Narrative   Not on file   Social Determinants of Health   Financial Resource Strain: Low Risk  (07/19/2022)   Overall Financial Resource Strain (CARDIA)    Difficulty of Paying Living Expenses: Not hard at all  Food Insecurity: No Food Insecurity (07/19/2022)   Hunger Vital Sign    Worried About Running Out of Food in the Last Year: Never true    [place] in the  Last Year: Never true  Transportation Needs: No Transportation Needs (07/19/2022)   PRAPARE - Hydrologist (Medical): No    Lack of Transportation (Non-Medical): No  Physical Activity: Inactive (07/19/2022)   Exercise Vital Sign    Days of Exercise per Week: 0 days    Minutes of Exercise per Session: 0 min  Stress: No Stress Concern Present (07/19/2022)   [place]    Feeling of Stress : Not at all  Social Connections: Moderately Integrated (07/19/2022)   Social Connection and Isolation Panel [NHANES]    Frequency of Communication with Friends and Family: More than three times a week    Frequency of Social Gatherings with Friends and Family: More than three times a week    Attends Religious Services: More than 4 times per year    Active Member of [employer] or Organizations: Yes    Attends Archivist Meetings: More than 4 times per year    Marital Status: Widowed  Intimate Partner Violence: Not At Risk (07/19/2022)   Humiliation, Afraid, Rape, and Kick questionnaire    Fear of Current or Ex-Partner: No    Emotionally Abused: No    Physically Abused: No    Sexually Abused: No    Family History: Family History  Problem Relation  Age of Onset   Macular degeneration Mother    Diabetes Father    Heart disease Daughter     Current Medications:  Current Outpatient Medications:    amLODipine (NORVASC) 5 MG tablet, Take 0.5 tablets (2.5 mg total) by mouth daily., Disp: , Rfl:    anastrozole (ARIMIDEX) 1 MG tablet, Take 1 tablet (1 mg total) by mouth daily., Disp: 90 tablet, Rfl: 3   atenolol (TENORMIN) 25 MG tablet, Take 25 mg by mouth daily., Disp: , Rfl:    buPROPion (WELLBUTRIN XL) 150 MG 24 hr tablet, Take 300 mg by mouth daily as needed (feeling moody/depressed)., Disp: , Rfl:    furosemide (LASIX) 20 MG tablet, Take 10 mg by mouth daily as needed for fluid., Disp: , Rfl:    insulin glargine (LANTUS) 100 UNIT/ML injection, Inject 0.16 mLs (16 Units total) into the skin at bedtime., Disp: 15 mL, Rfl: 3   Insulin Pen Needle (PEN NEEDLES) 31G X 6 MM MISC, Use to inject insulin once daily, Disp: 100 each, Rfl: 3   mupirocin ointment (BACTROBAN) 2 %, SMARTSIG:sparingly Topical 3 Times Daily, Disp: , Rfl:    olmesartan (BENICAR) 5 MG tablet, Take 5 mg by mouth daily., Disp: , Rfl:    simvastatin (ZOCOR) 20 MG tablet, Take 1 tablet (20 mg total) by mouth at bedtime., Disp: 90 tablet, Rfl: 3   Allergies: No Known Allergies  REVIEW OF SYSTEMS:   Review of Systems  Constitutional:  Negative for chills, fatigue and fever.  HENT:   Negative for lump/mass, mouth sores, nosebleeds, sore throat and trouble swallowing.   Eyes:  Negative for eye problems.  Respiratory:  Negative for cough and shortness of breath.   Cardiovascular:  Negative for chest pain, leg swelling and palpitations.  Gastrointestinal:  Negative for abdominal pain, constipation, diarrhea, nausea and vomiting.  Genitourinary:  Negative for bladder incontinence, difficulty urinating, dysuria, frequency, hematuria and nocturia.   Musculoskeletal:  Negative for arthralgias, back pain, flank pain, myalgias and neck pain.  Skin:  Negative for itching and rash.   Neurological:  Negative for dizziness, headaches and numbness.  Hematological:  Does  not bruise/bleed easily.  Psychiatric/Behavioral:  Negative for depression, sleep disturbance and suicidal ideas. The patient is not nervous/anxious.   All other systems reviewed and are negative.    VITALS:   Blood pressure (!) 180/99, pulse 66, temperature 98.8 F (37.1 C), temperature source Tympanic, resp. rate 18, height 5' 3"$  (1.6 m), weight 211 lb 6.4 oz (95.9 kg), SpO2 97 %.  Wt Readings from Last 3 Encounters:  12/11/22 211 lb 6.4 oz (95.9 kg)  11/14/22 205 lb 6.4 oz (93.2 kg)  08/27/22 205 lb (93 kg)    Body mass index is 37.45 kg/m.  Performance status (ECOG): 1 - Symptomatic but completely ambulatory  PHYSICAL EXAM:   Physical Exam Vitals and nursing note reviewed. Exam conducted with a chaperone present.  Constitutional:      Appearance: Normal appearance.  Cardiovascular:     Rate and Rhythm: Normal rate and regular rhythm.     Pulses: Normal pulses.     Heart sounds: Normal heart sounds.  Pulmonary:     Effort: Pulmonary effort is normal.     Breath sounds: Normal breath sounds.  Abdominal:     Palpations: Abdomen is soft. There is no hepatomegaly, splenomegaly or mass.     Tenderness: There is no abdominal tenderness.  Musculoskeletal:     Right lower leg: Edema (Ankle) present.     Left lower leg: No edema.  Lymphadenopathy:     Cervical: No cervical adenopathy.     Right cervical: No superficial, deep or posterior cervical adenopathy.    Left cervical: No superficial, deep or posterior cervical adenopathy.     Upper Body:     Right upper body: No supraclavicular or axillary adenopathy.     Left upper body: No supraclavicular or axillary adenopathy.  Neurological:     General: No focal deficit present.     Mental Status: She is alert and oriented to person, place, and time.  Psychiatric:        Mood and Affect: Mood normal.        Behavior: Behavior normal.      LABS:      Latest Ref Rng & Units 12/04/2022    9:44 AM 08/20/2022    2:32 PM 02/26/2022    4:02 AM  CBC  WBC 4.0 - 10.5 K/uL 10.0  7.9  7.1   Hemoglobin 12.0 - 15.0 g/dL 11.2  10.7  9.0   Hematocrit 36.0 - 46.0 % 35.2  33.4  29.1   Platelets 150 - 400 K/uL 447  378  364       Latest Ref Rng & Units 12/04/2022    9:45 AM 08/20/2022    2:32 PM 06/22/2022   12:00 AM  CMP  Glucose 70 - 99 mg/dL 193  239    BUN 8 - 23 mg/dL 39  32  39      Creatinine 0.44 - 1.00 mg/dL 1.46  1.54  1.6      Sodium 135 - 145 mmol/L 135  133  140      Potassium 3.5 - 5.1 mmol/L 4.9  4.7  5.7      Chloride 98 - 111 mmol/L 103  103  101      CO2 22 - 32 mmol/L 22  22  21      $ Calcium 8.9 - 10.3 mg/dL 9.5  9.0    Total Protein 6.5 - 8.1 g/dL 7.7  7.5    Total Bilirubin 0.3 - 1.2 mg/dL  0.7  0.8    Alkaline Phos 38 - 126 U/L 79  65  74      AST 15 - 41 U/L 16  15  13      $ ALT 0 - 44 U/L 11  11  6         $ This result is from an external source.     No results found for: "CEA1", "CEA" / No results found for: "CEA1", "CEA" No results found for: "PSA1" No results found for: "[id number]" No results found for: "CAN125"  No results found for: "TOTALPROTELP", "ALBUMINELP", "A1GS", "A2GS", "BETS", "BETA2SER", "GAMS", "MSPIKE", "SPEI" Lab Results  Component Value Date   FERRITIN 224 11/15/2021   FERRITIN 255 11/14/2021   No results found for: "LDH"   STUDIES:   MM DIAG BREAST TOMO BILATERAL  Result Date: 12/04/2022 CLINICAL DATA:  82 year old female with history of grade 2 invasive lobular carcinoma diagnosed August 2020 at site of X shaped biopsy marking clip in the right breast as well as atypical lobular hyperplasia in the right breast at site of coil shaped biopsy marking clip. Patient opted for nonsurgical treatment, currently taking anastrozole. EXAM: DIGITAL DIAGNOSTIC BILATERAL MAMMOGRAM WITH TOMOSYNTHESIS TECHNIQUE: Bilateral digital diagnostic mammography and breast tomosynthesis was performed.  COMPARISON:  Previous exam(s). ACR Breast Density Category b: There are scattered areas of fibroglandular density. FINDINGS: X shaped biopsy marking clip at site of biopsy proven malignancy and coil shaped biopsy marking clip at site of biopsy proven ALH are identified in the right breast with no associated mass or any associated worrisome mammographic abnormalities. No new masses or any other abnormality seen in the right breast. No suspicious masses or calcifications seen in the left breast. IMPRESSION: 1. No associated masses or any other suspicious abnormalities identified at the sites of biopsy proven invasive lobular carcinoma and ALH in the right breast. 2.  No mammographic evidence malignancy in the left breast. RECOMMENDATION: Continue treatment plan for known right breast malignancy. I have discussed the findings and recommendations with the patient. If applicable, a reminder letter will be sent to the patient regarding the next appointment. BI-RADS CATEGORY  6: Known biopsy-proven malignancy. Electronically Signed   By: Everlean Alstrom M.D.   On: 12/04/2022 11:14

## 2022-12-12 ENCOUNTER — Ambulatory Visit (HOSPITAL_COMMUNITY): Payer: Medicare HMO | Admitting: Physical Therapy

## 2022-12-12 ENCOUNTER — Encounter: Payer: Self-pay | Admitting: Vascular Surgery

## 2022-12-12 ENCOUNTER — Ambulatory Visit (INDEPENDENT_AMBULATORY_CARE_PROVIDER_SITE_OTHER): Payer: Medicare HMO | Admitting: Vascular Surgery

## 2022-12-12 ENCOUNTER — Ambulatory Visit (INDEPENDENT_AMBULATORY_CARE_PROVIDER_SITE_OTHER): Payer: Medicare HMO

## 2022-12-12 VITALS — BP 209/92 | HR 76 | Temp 98.2°F | Ht 63.0 in | Wt 212.2 lb

## 2022-12-12 DIAGNOSIS — T148XXD Other injury of unspecified body region, subsequent encounter: Secondary | ICD-10-CM | POA: Diagnosis not present

## 2022-12-12 DIAGNOSIS — I872 Venous insufficiency (chronic) (peripheral): Secondary | ICD-10-CM

## 2022-12-12 NOTE — Progress Notes (Signed)
[facility] [place]  Patient name: Krista Payne MRN: [id number] DOB: 12/09/1940 Sex: female  REASON FOR CONSULT: Duration arterial flow right leg with leg wounds.  HPI: Krista Payne is a 82 y.o. female, who is here today for evaluation.  She has had approximately 1 to 2 months of excoriation and weeping from wounds circumferentially on her right leg.  This is below her knee and above her ankle.  She has had marked swelling associated with this.  Does not have any left leg wounds.  She has been managed through physical therapy at [facility] as an outpatient  Past Medical History:  Diagnosis Date   Breast cancer ([place])    R   Chronic kidney disease    Diabetes mellitus without complication ([place])    Hyperlipidemia    Hypertension    Macular degeneration    Osteoarthritis     Family History  Problem Relation Age of Onset   Macular degeneration Mother    Diabetes Father    Heart disease Daughter     SOCIAL HISTORY: Social History   Socioeconomic History   Marital status: Widowed    Spouse name: Not on file   Number of children: 3   Years of education: 63   Highest education level: 12th grade  Occupational History   Not on file  Tobacco Use   Smoking status: Never    Passive exposure: Never   Smokeless tobacco: Never  Vaping Use   Vaping Use: Never used  Substance and Sexual Activity   Alcohol use: No   Drug use: No   Sexual activity: Not Currently    Partners: Male  Other Topics Concern   Not on file  Social History Narrative   Not on file   Social Determinants of Health   Financial Resource Strain: Low Risk  (07/19/2022)   Overall Financial Resource Strain (CARDIA)    Difficulty of Paying Living Expenses: Not hard at all  Food Insecurity: No Food Insecurity (07/19/2022)   Hunger Vital Sign    Worried About Running Out of Food in the Last Year: Never true    [place] in the Last Year: Never  true  Transportation Needs: No Transportation Needs (07/19/2022)   PRAPARE - Hydrologist (Medical): No    Lack of Transportation (Non-Medical): No  Physical Activity: Inactive (07/19/2022)   Exercise Vital Sign    Days of Exercise per Week: 0 days    Minutes of Exercise per Session: 0 min  Stress: No Stress Concern Present (07/19/2022)   [place]    Feeling of Stress : Not at all  Social Connections: Moderately Integrated (07/19/2022)   Social Connection and Isolation Panel [NHANES]    Frequency of Communication with Friends and Family: More than three times a week    Frequency of Social Gatherings with Friends and Family: More than three times a week    Attends Religious Services: More than 4 times per year    Active Member of [employer] or Organizations: Yes    Attends Archivist Meetings: More than 4 times per year    Marital Status: Widowed  Intimate Partner Violence: Not At Risk (07/19/2022)   Humiliation, Afraid, Rape, and Kick questionnaire    Fear of Current or Ex-Partner: No    Emotionally Abused: No    Physically Abused: No    Sexually Abused: No  No Known Allergies  Current Outpatient Medications  Medication Sig Dispense Refill   amLODipine (NORVASC) 5 MG tablet Take 0.5 tablets (2.5 mg total) by mouth daily.     anastrozole (ARIMIDEX) 1 MG tablet Take 1 tablet (1 mg total) by mouth daily. 90 tablet 3   atenolol (TENORMIN) 25 MG tablet Take 25 mg by mouth daily.     buPROPion (WELLBUTRIN XL) 150 MG 24 hr tablet Take 300 mg by mouth daily as needed (feeling moody/depressed).     furosemide (LASIX) 20 MG tablet Take 10 mg by mouth daily as needed for fluid.     insulin glargine (LANTUS) 100 UNIT/ML injection Inject 0.16 mLs (16 Units total) into the skin at bedtime. 15 mL 3   Insulin Pen Needle (PEN NEEDLES) 31G X 6 MM MISC Use to inject insulin once daily 100 each 3    mupirocin ointment (BACTROBAN) 2 % SMARTSIG:sparingly Topical 3 Times Daily     olmesartan (BENICAR) 5 MG tablet Take 5 mg by mouth daily.     simvastatin (ZOCOR) 20 MG tablet Take 1 tablet (20 mg total) by mouth at bedtime. 90 tablet 3   No current facility-administered medications for this visit.    REVIEW OF SYSTEMS:  [X]$  denotes positive finding, [ ]$  denotes negative finding Cardiac  Comments:  Chest pain or chest pressure:    Shortness of breath upon exertion:    Short of breath when lying flat:    Irregular heart rhythm:        Vascular    Pain in calf, thigh, or hip brought on by ambulation:    Pain in feet at night that wakes you up from your sleep:     Blood clot in your veins:    Leg swelling:  xc       Pulmonary    Oxygen at home:    Productive cough:     Wheezing:         Neurologic    Sudden weakness in arms or legs:     Sudden numbness in arms or legs:     Sudden onset of difficulty speaking or slurred speech:    Temporary loss of vision in one eye:     Problems with dizziness:         Gastrointestinal    Blood in stool:     Vomited blood:         Genitourinary    Burning when urinating:     Blood in urine:        Psychiatric    Major depression:         Hematologic    Bleeding problems:    Problems with blood clotting too easily:        Skin    Rashes or ulcers:        Constitutional    Fever or chills:      PHYSICAL EXAM: Vitals:   12/12/22 1507  BP: (!) 209/92  Pulse: 76  Temp: 98.2 F (36.8 C)  SpO2: 97%  Weight: 212 lb 3.2 oz (96.3 kg)  Height: 5' 3"$  (1.6 m)    GENERAL: The patient is a well-nourished female, in no acute distress. The vital signs are documented above. CARDIOVASCULAR: I do not palpate pedal pulses bilaterally.  She does have marked swelling worse on the right than on the left. PULMONARY: There is good air exchange  MUSCULOSKELETAL: There are no major deformities or cyanosis. NEUROLOGIC: No focal weakness or  paresthesias  are detected. SKIN: There are no ulcers or rashes noted. PSYCHIATRIC: The patient has a normal affect.  DATA:  Noninvasive studies in our office today were reviewed with the patient.  We are unable to put a blood pressure cuff over the excoriated area.  Her toe brachial index is mildly diminished.  She does have normal triphasic waveforms bilaterally.  MEDICAL ISSUES: No evidence of significant arterial disease.  I feel that this is related to swelling and venous congestion.  She will continue with wound management.  I would consider adding compression to the wound treatment with Coban or multilayer wrap.  She will see [place] again on an as-needed basis   Rosetta Posner, MD [facility] Tel [phone] Pager [phone]  Note: Portions of this report may have been transcribed using voice recognition software.  Every effort has been made to ensure accuracy; however, inadvertent computerized transcription errors may still be present.

## 2022-12-13 LAB — VAS US ABI WITH/WO TBI
Left ABI: 1.25
Right ABI: UNDETERMINED

## 2022-12-18 ENCOUNTER — Encounter (HOSPITAL_COMMUNITY): Payer: Self-pay | Admitting: Physical Therapy

## 2022-12-18 ENCOUNTER — Ambulatory Visit (HOSPITAL_COMMUNITY): Payer: Medicare HMO | Admitting: Physical Therapy

## 2022-12-18 DIAGNOSIS — R2689 Other abnormalities of gait and mobility: Secondary | ICD-10-CM

## 2022-12-18 DIAGNOSIS — E11621 Type 2 diabetes mellitus with foot ulcer: Secondary | ICD-10-CM

## 2022-12-18 DIAGNOSIS — L97519 Non-pressure chronic ulcer of other part of right foot with unspecified severity: Secondary | ICD-10-CM | POA: Diagnosis not present

## 2022-12-18 NOTE — Therapy (Signed)
OUTPATIENT PHYSICAL THERAPY Wound Treatment Patient Name: ROTUNDA GENSER MRN: [id number] DOB:August 03, 1941, 82 y.o., female Today's Date: 12/18/2022   PCP: Celene Squibb, MD REFERRING PROVIDER: Celene Squibb, MD  END OF SESSION:   PT End of Session - 12/18/22 1307     Visit Number 13    Number of Visits 28    Date for PT Re-Evaluation 01/15/23    Authorization Type Primary: Aetna Medicare Secondary: Tricare (no auth, no VL)    Progress Note Due on Visit 19    PT Start Time 1306    PT Stop Time 1346    PT Time Calculation (min) 40 min    Activity Tolerance Patient tolerated treatment well    Behavior During Therapy WFL for tasks assessed/performed                       Past Medical History:  Diagnosis Date   Breast cancer ([place])    R   Chronic kidney disease    Diabetes mellitus without complication ([place])    Hyperlipidemia    Hypertension    Macular degeneration    Osteoarthritis    Past Surgical History:  Procedure Laterality Date   CATARACT EXTRACTION W/PHACO Right 02/07/2015   Procedure: CATARACT EXTRACTION PHACO AND INTRAOCULAR LENS PLACEMENT ([place]);  Surgeon: Tonny Branch, MD;  Location: [facility];  Service: Ophthalmology;  Laterality: Right;  CDE:12.72   CATARACT EXTRACTION W/PHACO Left 02/24/2015   Procedure: CATARACT EXTRACTION PHACO AND INTRAOCULAR LENS PLACEMENT LEFT EYE;  Surgeon: Tonny Branch, MD;  Location: [facility];  Service: Ophthalmology;  Laterality: Left;  CDE 11.69   COLONOSCOPY     Patient Active Problem List   Diagnosis Date Noted   Unspecified Escherichia coli (E. coli) as the cause of diseases classified elsewhere 02/23/2022   Sepsis ([place]) 02/22/2022   Acute kidney failure ([place]) 02/22/2022   Fall at home, initial encounter 02/22/2022   Personal history of COVID-19 02/02/2022   Pressure injury of skin 11/15/2021   Depression, unspecified 11/15/2021   Hypertensive heart and chronic kidney disease with heart failure and stage 1 through stage 4 chronic  kidney disease, or unspecified chronic kidney disease ([place]) 11/15/2021   Insomnia 11/15/2021   Muscle weakness (generalized) 11/15/2021   Other abnormalities of gait and mobility 11/15/2021   Other forms of acute ischemic heart disease 11/15/2021   Traumatic ischemia of muscle ([place]) [id number]   Diastolic heart failure ([place]) 11/15/2021   Diabetic foot infection ([place]) [id number]   Acute metabolic encephalopathy [id number]   Chronic kidney disease, stage 3b ([place]) 11/14/2021   Type 2 diabetes with nephropathy ([place]) 11/14/2021   Elevated troponin 11/13/2021   Lactic acidosis 11/13/2021   COVID-19 virus infection 11/13/2021   Hyperglycemia due to diabetes mellitus ([place]) 11/13/2021   Ankle wound, right, initial encounter 11/13/2021   Altered mental status 11/13/2021   Dehydration 11/13/2021   Long term (current) use of oral hypoglycemic drugs 10/29/2021   Unspecified macular degeneration 10/29/2021   Chronic kidney disease 05/17/2021   Personal history of malignant neoplasm of breast 10/29/2020   Malignant neoplasm of unspecified site of right female breast ([place]) 07/16/2019   URI (upper respiratory infection) 11/20/2011   Other seasonal allergic rhinitis 11/23/2010   Urinary tract infection without hematuria 11/23/2010   RENAL INSUFFICIENCY 10/26/2010   OSTEOARTHRITIS 10/18/2010   Osteoarthritis 10/18/2010   Vitamin D deficiency 08/10/2009   Hyperlipidemia 08/10/2009   Body mass index (BMI) 39.0-39.9, adult 08/10/2009   EDEMA  01/20/2008   Anemia in chronic kidney disease 06/19/2007   Hypertension 06/02/2007    ONSET DATE: 07/05/22  REFERRING DIAG: ulcer of right foot due to type 2 diabetes mellitus  THERAPY DIAG:  Ulcer of right foot due to type 2 diabetes mellitus (HCC)  Other abnormalities of gait and mobility  Rationale for Evaluation and Treatment: Rehabilitation     Wound Therapy - 12/18/22 0001     Subjective pt states she went to vascular MD for testing and they  didnt find anything    Patient and Family Stated Goals wound to heal    Date of Onset 07/05/22    Prior Treatments cream    Pain Score 0-No pain    Evaluation and Treatment Procedures Explained to Patient/Family Yes    Evaluation and Treatment Procedures agreed to    Wound Properties Date First Assessed: 10/04/22 Time First Assessed: 1345 Wound Type: Venous stasis ulcer Location: Pretibial Location Orientation: Distal;Right Wound Description (Comments): medial ankle wound superior Present on Admission: Yes   Dressing Type Impregnated gauze (bismuth);Gauze (Comment)   vaseline,  xeroform, extra cotton, kerlix, netting #5   Dressing Changed Changed    Dressing Status Old drainage    Dressing Change Frequency PRN    Site / Wound Assessment Yellow;Pink    % Wound base Red or Granulating 100%    Peri-wound Assessment Edema;Erythema (non-blanchable);Maceration    Drainage Amount Moderate    Drainage Description Serous    Treatment Cleansed;Debridement (Selective)    Selective Debridement (non-excisional) - Location Rt  LE  wound base and peeling skin throughout RLE    Selective Debridement (non-excisional) - Tools Used Forceps    Selective Debridement (non-excisional) - Tissue Removed slough and dry skin    Wound Therapy - Clinical Statement Patient arrives with dressing half removed, remaining was some silver hydrofiber and  ABD pads crusted to leg with netting on top and distal netting bunched up creating a spot digging into ankle. When asked about who applied the dressing, she stated she did. Then a few minutes later when questioned about the poor quality and filth, she stated she was unsure who dressed it and that she could not do it herself.  Patient with increased edema in foot with flacking skin. RLE dirty with hair, fuzz, and dry flaking skin along with dried drainage throughout. Cracking and peeling skin to numerous spots. Distal half of LE with redness, raw, and weeping. Continued oderous  leg/dressings. Required several wash cloths with soap and water to cleanse leg with continued dry skin and exudate present. Spent most of session debriding filth/skin/edudate from leg. Educated patient on importance of keeping dressing dry, clean and intact. Edema massage to RLE with lotion and vaseline for skin protection. Applied 5 sheets of silver hydrofiber due to weeping followed by kerlix, 2 ABD for drainage/shaping, 3 rolls cotton for shaping/drainage, 2 rolls coban and netting. RLE overall looking poor today likely due to improper/imcomplete LE dressing and poor hygiene. Patient will need addtional PT visits to promote RLE healing.    Wound Therapy - Functional Problem List bathing, dressing    Factors Delaying/Impairing Wound Healing Altered sensation;Diabetes Mellitus;Vascular compromise;Multiple medical problems    Hydrotherapy Plan Debridement;Dressing change;Patient/family education    Wound Therapy - Frequency 2X / week    Wound Therapy - Current Recommendations PT    Wound Plan continue with appropriate dressings, no compression as appears to have mixed venous/arterial deficit.  Follow up if MD has referred or contacted pt or  clinic.    Dressing  Lotion, vaseline,silver hydro distal LE,kerlix, ABD,  cotton, coban, netting #5                  PATIENT EDUCATION: Education details: 11/21/22, POC, changing dressing if soiled; EVAL Patient educated on exam findings, POC, scope of PT, HEP, and keeping dressing on unless soiled or panful. Person educated: Patient Education method: Explanation, Demonstration, and Handouts Education comprehension: verbalized understanding, returned demonstration, verbal cues required, and tactile cues required   HOME EXERCISE PROGRAM: N/a   GOALS: Goals reviewed with patient? Yes  SHORT TERM GOALS: Target date: 10/25/2022    Wound to be free from slough to demonstrate healing.  Baseline: Goal status: IN PROGRESS   LONG TERM GOALS:  Target date: 11/15/2022    Patient wound to be healed to reduce risk of infection. Baseline:  Goal status: IN PROGRESS  2.  Patient will state importance of compression garment use and be able to don to reduce risk of further wound development.  Baseline:  Goal status: IN PROGRESS   ASSESSMENT:  CLINICAL IMPRESSION: See above  OBJECTIVE IMPAIRMENTS: Abnormal gait, decreased activity tolerance, decreased balance, decreased mobility, difficulty walking, impaired sensation, and improper body mechanics.   ACTIVITY LIMITATIONS: carrying, lifting, standing, squatting, stairs, transfers, locomotion level, and caring for others  PARTICIPATION LIMITATIONS: meal prep, cleaning, laundry, shopping, community activity, and yard work  PERSONAL FACTORS: 3+ comorbidities: DM, CKD, HLD, HTN  are also affecting patient's functional outcome.   REHAB POTENTIAL: Good  CLINICAL DECISION MAKING: Evolving/moderate complexity  EVALUATION COMPLEXITY: Moderate  PLAN: PT FREQUENCY: 2x/week  PT DURATION: 4 weeks  PLANNED INTERVENTIONS: Therapeutic exercises, Therapeutic activity, Neuromuscular re-education, Balance training, Gait training, Patient/Family education, Joint manipulation, Joint mobilization, Stair training, Orthotic/Fit training, DME instructions, Aquatic Therapy, Dry Needling, Electrical stimulation, Spinal manipulation, Spinal mobilization, Cryotherapy, Moist heat, Compression bandaging, scar mobilization, Splintting, Taping, Traction, Ultrasound, Ionotophoresis 34m/ml Dexamethasone, and Manual therapy   PLAN FOR NEXT SESSION: debride and dressing changes PRN.  3:12 PM, 12/18/22 AMearl LatinPT, DPT [facility]

## 2022-12-20 ENCOUNTER — Ambulatory Visit (HOSPITAL_COMMUNITY): Payer: Medicare HMO | Admitting: Physical Therapy

## 2022-12-21 DIAGNOSIS — H353231 Exudative age-related macular degeneration, bilateral, with active choroidal neovascularization: Secondary | ICD-10-CM | POA: Diagnosis not present

## 2022-12-25 ENCOUNTER — Ambulatory Visit (HOSPITAL_COMMUNITY): Payer: Medicare HMO | Admitting: Physical Therapy

## 2022-12-25 ENCOUNTER — Encounter (HOSPITAL_COMMUNITY): Payer: Self-pay | Admitting: Physical Therapy

## 2022-12-25 DIAGNOSIS — L97519 Non-pressure chronic ulcer of other part of right foot with unspecified severity: Secondary | ICD-10-CM | POA: Diagnosis not present

## 2022-12-25 DIAGNOSIS — E11621 Type 2 diabetes mellitus with foot ulcer: Secondary | ICD-10-CM

## 2022-12-25 DIAGNOSIS — R2689 Other abnormalities of gait and mobility: Secondary | ICD-10-CM | POA: Diagnosis not present

## 2022-12-25 NOTE — Therapy (Signed)
OUTPATIENT PHYSICAL THERAPY Wound Treatment Patient Name: Krista Payne MRN: [id number] DOB:[birth year], 82 y.o., female Today's Date: 12/25/2022   PCP: Celene Squibb, MD REFERRING PROVIDER: Celene Squibb, MD  END OF SESSION:   PT End of Session - 12/25/22 1424     Visit Number 14    Number of Visits 28    Date for PT Re-Evaluation 01/15/23    Authorization Type Primary: Aetna Medicare Secondary: Tricare (no auth, no VL)    Progress Note Due on Visit 19    PT Start Time 1424    PT Stop Time 1512    PT Time Calculation (min) 48 min    Activity Tolerance Patient tolerated treatment well    Behavior During Therapy WFL for tasks assessed/performed                       Past Medical History:  Diagnosis Date   Breast cancer ([place])    R   Chronic kidney disease    Diabetes mellitus without complication ([place])    Hyperlipidemia    Hypertension    Macular degeneration    Osteoarthritis    Past Surgical History:  Procedure Laterality Date   CATARACT EXTRACTION W/PHACO Right 02/07/2015   Procedure: CATARACT EXTRACTION PHACO AND INTRAOCULAR LENS PLACEMENT ([place]);  Surgeon: Tonny Branch, MD;  Location: [facility];  Service: Ophthalmology;  Laterality: Right;  CDE:12.72   CATARACT EXTRACTION W/PHACO Left 02/24/2015   Procedure: CATARACT EXTRACTION PHACO AND INTRAOCULAR LENS PLACEMENT LEFT EYE;  Surgeon: Tonny Branch, MD;  Location: [facility];  Service: Ophthalmology;  Laterality: Left;  CDE 11.69   COLONOSCOPY     Patient Active Problem List   Diagnosis Date Noted   Unspecified Escherichia coli (E. coli) as the cause of diseases classified elsewhere 02/23/2022   Sepsis ([place]) 02/22/2022   Acute kidney failure ([place]) 02/22/2022   Fall at home, initial encounter 02/22/2022   Personal history of COVID-19 02/02/2022   Pressure injury of skin 11/15/2021   Depression, unspecified 11/15/2021   Hypertensive heart and chronic kidney disease with heart failure and stage 1 through stage 4 chronic  kidney disease, or unspecified chronic kidney disease ([place]) 11/15/2021   Insomnia 11/15/2021   Muscle weakness (generalized) 11/15/2021   Other abnormalities of gait and mobility 11/15/2021   Other forms of acute ischemic heart disease 11/15/2021   Traumatic ischemia of muscle ([place]) [id number]   Diastolic heart failure ([place]) 11/15/2021   Diabetic foot infection ([place]) [id number]   Acute metabolic encephalopathy [id number]   Chronic kidney disease, stage 3b ([place]) 11/14/2021   Type 2 diabetes with nephropathy ([place]) 11/14/2021   Elevated troponin 11/13/2021   Lactic acidosis 11/13/2021   COVID-19 virus infection 11/13/2021   Hyperglycemia due to diabetes mellitus ([place]) 11/13/2021   Ankle wound, right, initial encounter 11/13/2021   Altered mental status 11/13/2021   Dehydration 11/13/2021   Long term (current) use of oral hypoglycemic drugs 10/29/2021   Unspecified macular degeneration 10/29/2021   Chronic kidney disease 05/17/2021   Personal history of malignant neoplasm of breast 10/29/2020   Malignant neoplasm of unspecified site of right female breast ([place]) 07/16/2019   URI (upper respiratory infection) 11/20/2011   Other seasonal allergic rhinitis 11/23/2010   Urinary tract infection without hematuria 11/23/2010   RENAL INSUFFICIENCY 10/26/2010   OSTEOARTHRITIS 10/18/2010   Osteoarthritis 10/18/2010   Vitamin D deficiency 08/10/2009   Hyperlipidemia 08/10/2009   Body mass index (BMI) 39.0-39.9, adult 08/10/2009   EDEMA  01/20/2008   Anemia in chronic kidney disease 06/19/2007   Hypertension 06/02/2007    ONSET DATE: 07/05/22  REFERRING DIAG: ulcer of right foot due to type 2 diabetes mellitus  THERAPY DIAG:  Ulcer of right foot due to type 2 diabetes mellitus ([place])  Other abnormalities of gait and mobility  Rationale for Evaluation and Treatment: Rehabilitation     Wound Therapy - 12/25/22 0001     Subjective pt states her leg is feeling alright, tried to keep it dry     Patient and Family Stated Goals wound to heal    Date of Onset 07/05/22    Prior Treatments cream    Evaluation and Treatment Procedures Explained to Patient/Family Yes    Evaluation and Treatment Procedures agreed to    Wound Properties Date First Assessed: 10/04/22 Time First Assessed: 1345 Wound Type: Venous stasis ulcer Location: Pretibial Location Orientation: Distal;Right Wound Description (Comments): medial ankle wound superior Present on Admission: Yes   Dressing Type Impregnated gauze (bismuth);Gauze (Comment)   vaseline,  xeroform, extra cotton, kerlix, netting #5   Dressing Status Old drainage    Dressing Change Frequency PRN    Site / Wound Assessment Yellow;Pink    % Wound base Red or Granulating 100%    Peri-wound Assessment Edema;Erythema (non-blanchable);Maceration    Drainage Amount Minimal    Drainage Description Serous    Treatment Cleansed;Debridement (Selective)    Selective Debridement (non-excisional) - Location Rt  LE  wound base and peeling skin throughout RLE    Selective Debridement (non-excisional) - Tools Used Forceps    Selective Debridement (non-excisional) - Tissue Removed slough and dry skin    Wound Therapy - Clinical Statement Patient with dressing intact and dry today. Edema has decreased in RLE but some in foot likely due to dressing sliding up foot from shuffled gait and small footware. RLE continues to be red with dry skin/ dried exudate throughout. Debrided as much as possible and continued with same dressing as it appears to be improving. Although redness and weeping continues.    Wound Therapy - Functional Problem List bathing, dressing    Factors Delaying/Impairing Wound Healing Altered sensation;Diabetes Mellitus;Vascular compromise;Multiple medical problems    Hydrotherapy Plan Debridement;Dressing change;Patient/family education    Wound Therapy - Frequency 2X / week    Wound Therapy - Current Recommendations PT    Wound Plan continue with  appropriate dressings    Dressing  Lotion, vaseline,silver hydro distal LE,kerlix, ABD,  cotton, coban, netting #5                  PATIENT EDUCATION: Education details: 11/21/22, POC, changing dressing if soiled; EVAL Patient educated on exam findings, POC, scope of PT, HEP, and keeping dressing on unless soiled or panful. Person educated: Patient Education method: Explanation, Demonstration, and Handouts Education comprehension: verbalized understanding, returned demonstration, verbal cues required, and tactile cues required   HOME EXERCISE PROGRAM: N/a   GOALS: Goals reviewed with patient? Yes  SHORT TERM GOALS: Target date: 10/25/2022    Wound to be free from slough to demonstrate healing.  Baseline: Goal status: IN PROGRESS   LONG TERM GOALS: Target date: 11/15/2022    Patient wound to be healed to reduce risk of infection. Baseline:  Goal status: IN PROGRESS  2.  Patient will state importance of compression garment use and be able to don to reduce risk of further wound development.  Baseline:  Goal status: IN PROGRESS   ASSESSMENT:  CLINICAL IMPRESSION: See above  OBJECTIVE IMPAIRMENTS: Abnormal gait, decreased activity tolerance, decreased balance, decreased mobility, difficulty walking, impaired sensation, and improper body mechanics.   ACTIVITY LIMITATIONS: carrying, lifting, standing, squatting, stairs, transfers, locomotion level, and caring for others  PARTICIPATION LIMITATIONS: meal prep, cleaning, laundry, shopping, community activity, and yard work  PERSONAL FACTORS: 3+ comorbidities: DM, CKD, HLD, HTN  are also affecting patient's functional outcome.   REHAB POTENTIAL: Good  CLINICAL DECISION MAKING: Evolving/moderate complexity  EVALUATION COMPLEXITY: Moderate  PLAN: PT FREQUENCY: 2x/week  PT DURATION: 4 weeks  PLANNED INTERVENTIONS: Therapeutic exercises, Therapeutic activity, Neuromuscular re-education, Balance training, Gait  training, Patient/Family education, Joint manipulation, Joint mobilization, Stair training, Orthotic/Fit training, DME instructions, Aquatic Therapy, Dry Needling, Electrical stimulation, Spinal manipulation, Spinal mobilization, Cryotherapy, Moist heat, Compression bandaging, scar mobilization, Splintting, Taping, Traction, Ultrasound, Ionotophoresis '4mg'$ /ml Dexamethasone, and Manual therapy   PLAN FOR NEXT SESSION: debride and dressing changes PRN.  3:22 PM, 12/25/22 Mearl Latin PT, DPT Physical Therapist at [facility]

## 2022-12-26 DIAGNOSIS — D638 Anemia in other chronic diseases classified elsewhere: Secondary | ICD-10-CM | POA: Diagnosis not present

## 2022-12-26 DIAGNOSIS — E1129 Type 2 diabetes mellitus with other diabetic kidney complication: Secondary | ICD-10-CM | POA: Diagnosis not present

## 2022-12-26 DIAGNOSIS — N189 Chronic kidney disease, unspecified: Secondary | ICD-10-CM | POA: Diagnosis not present

## 2022-12-26 DIAGNOSIS — E1122 Type 2 diabetes mellitus with diabetic chronic kidney disease: Secondary | ICD-10-CM | POA: Diagnosis not present

## 2022-12-26 DIAGNOSIS — I129 Hypertensive chronic kidney disease with stage 1 through stage 4 chronic kidney disease, or unspecified chronic kidney disease: Secondary | ICD-10-CM | POA: Diagnosis not present

## 2022-12-26 DIAGNOSIS — E875 Hyperkalemia: Secondary | ICD-10-CM | POA: Diagnosis not present

## 2022-12-26 DIAGNOSIS — R809 Proteinuria, unspecified: Secondary | ICD-10-CM | POA: Diagnosis not present

## 2022-12-27 DIAGNOSIS — E1122 Type 2 diabetes mellitus with diabetic chronic kidney disease: Secondary | ICD-10-CM | POA: Diagnosis not present

## 2022-12-27 DIAGNOSIS — N189 Chronic kidney disease, unspecified: Secondary | ICD-10-CM | POA: Diagnosis not present

## 2022-12-27 DIAGNOSIS — I129 Hypertensive chronic kidney disease with stage 1 through stage 4 chronic kidney disease, or unspecified chronic kidney disease: Secondary | ICD-10-CM | POA: Diagnosis not present

## 2022-12-27 DIAGNOSIS — D638 Anemia in other chronic diseases classified elsewhere: Secondary | ICD-10-CM | POA: Diagnosis not present

## 2022-12-27 DIAGNOSIS — E875 Hyperkalemia: Secondary | ICD-10-CM | POA: Diagnosis not present

## 2022-12-27 DIAGNOSIS — R809 Proteinuria, unspecified: Secondary | ICD-10-CM | POA: Diagnosis not present

## 2022-12-27 DIAGNOSIS — E1129 Type 2 diabetes mellitus with other diabetic kidney complication: Secondary | ICD-10-CM | POA: Diagnosis not present

## 2023-01-01 ENCOUNTER — Ambulatory Visit (HOSPITAL_COMMUNITY): Payer: Medicare HMO | Attending: Internal Medicine | Admitting: Physical Therapy

## 2023-01-01 DIAGNOSIS — R2689 Other abnormalities of gait and mobility: Secondary | ICD-10-CM | POA: Diagnosis not present

## 2023-01-01 DIAGNOSIS — E11621 Type 2 diabetes mellitus with foot ulcer: Secondary | ICD-10-CM | POA: Insufficient documentation

## 2023-01-01 DIAGNOSIS — L97519 Non-pressure chronic ulcer of other part of right foot with unspecified severity: Secondary | ICD-10-CM | POA: Diagnosis not present

## 2023-01-01 NOTE — Therapy (Signed)
OUTPATIENT PHYSICAL THERAPY Wound Treatment Patient Name: Krista Payne MRN: [id number] DOB:05/17/1941, 82 y.o., female Today's Date: 01/01/2023   PCP: Celene Squibb, MD REFERRING PROVIDER: Celene Squibb, MD  END OF SESSION:   PT End of Session - 01/01/23 1441     Visit Number 15    Number of Visits 28    Date for PT Re-Evaluation 01/15/23    Authorization Type Primary: Aetna Medicare Secondary: Tricare (no auth, no VL)    Progress Note Due on Visit 19    PT Start Time 1304    PT Stop Time 1405    PT Time Calculation (min) 61 min    Activity Tolerance Patient tolerated treatment well    Behavior During Therapy WFL for tasks assessed/performed                       Past Medical History:  Diagnosis Date   Breast cancer ([place])    R   Chronic kidney disease    Diabetes mellitus without complication ([place])    Hyperlipidemia    Hypertension    Macular degeneration    Osteoarthritis    Past Surgical History:  Procedure Laterality Date   CATARACT EXTRACTION W/PHACO Right 02/07/2015   Procedure: CATARACT EXTRACTION PHACO AND INTRAOCULAR LENS PLACEMENT ([place]);  Surgeon: Tonny Branch, MD;  Location: [facility];  Service: Ophthalmology;  Laterality: Right;  CDE:12.72   CATARACT EXTRACTION W/PHACO Left 02/24/2015   Procedure: CATARACT EXTRACTION PHACO AND INTRAOCULAR LENS PLACEMENT LEFT EYE;  Surgeon: Tonny Branch, MD;  Location: [facility];  Service: Ophthalmology;  Laterality: Left;  CDE 11.69   COLONOSCOPY     Patient Active Problem List   Diagnosis Date Noted   Unspecified Escherichia coli (E. coli) as the cause of diseases classified elsewhere 02/23/2022   Sepsis ([place]) 02/22/2022   Acute kidney failure ([place]) 02/22/2022   Fall at home, initial encounter 02/22/2022   Personal history of COVID-19 02/02/2022   Pressure injury of skin 11/15/2021   Depression, unspecified 11/15/2021   Hypertensive heart and chronic kidney disease with heart failure and stage 1 through stage 4 chronic  kidney disease, or unspecified chronic kidney disease ([place]) 11/15/2021   Insomnia 11/15/2021   Muscle weakness (generalized) 11/15/2021   Other abnormalities of gait and mobility 11/15/2021   Other forms of acute ischemic heart disease 11/15/2021   Traumatic ischemia of muscle ([place]) [id number]   Diastolic heart failure ([place]) 11/15/2021   Diabetic foot infection ([place]) [id number]   Acute metabolic encephalopathy [id number]   Chronic kidney disease, stage 3b ([place]) 11/14/2021   Type 2 diabetes with nephropathy ([place]) 11/14/2021   Elevated troponin 11/13/2021   Lactic acidosis 11/13/2021   COVID-19 virus infection 11/13/2021   Hyperglycemia due to diabetes mellitus ([place]) 11/13/2021   Ankle wound, right, initial encounter 11/13/2021   Altered mental status 11/13/2021   Dehydration 11/13/2021   Long term (current) use of oral hypoglycemic drugs 10/29/2021   Unspecified macular degeneration 10/29/2021   Chronic kidney disease 05/17/2021   Personal history of malignant neoplasm of breast 10/29/2020   Malignant neoplasm of unspecified site of right female breast ([place]) 07/16/2019   URI (upper respiratory infection) 11/20/2011   Other seasonal allergic rhinitis 11/23/2010   Urinary tract infection without hematuria 11/23/2010   RENAL INSUFFICIENCY 10/26/2010   OSTEOARTHRITIS 10/18/2010   Osteoarthritis 10/18/2010   Vitamin D deficiency 08/10/2009   Hyperlipidemia 08/10/2009   Body mass index (BMI) 39.0-39.9, adult 08/10/2009   EDEMA  01/20/2008   Anemia in chronic kidney disease 06/19/2007   Hypertension 06/02/2007    ONSET DATE: 07/05/22  REFERRING DIAG: ulcer of right foot due to type 2 diabetes mellitus  THERAPY DIAG:  Ulcer of right foot due to type 2 diabetes mellitus ([place])  Other abnormalities of gait and mobility  Rationale for Evaluation and Treatment: Rehabilitation     Wound Therapy - 01/01/23 1442     Subjective pt reports she's been keeping it dry.  Gets itchy but  doesn't really hurt.    Patient and Family Stated Goals wound to heal    Date of Onset 07/05/22    Prior Treatments cream    Evaluation and Treatment Procedures Explained to Patient/Family Yes    Evaluation and Treatment Procedures agreed to    Wound Properties Date First Assessed: 10/04/22 Time First Assessed: [id number] Wound Type: Venous stasis ulcer Location: Pretibial Location Orientation: Distal;Right Wound Description (Comments): medial ankle wound superior Present on Admission: Yes   Wound Image Images linked: 3    Dressing Type Gauze (Comment);Silver hydrofiber;Compression wrap   vaseline,  xeroform, extra cotton, kerlix, netting #5   Dressing Status Old drainage    Dressing Change Frequency PRN    Site / Wound Assessment Yellow;Pink    % Wound base Other/Granulation Tissue (Comment) 100%    Peri-wound Assessment Edema;Erythema (non-blanchable);Maceration    Drainage Amount Minimal    Drainage Description Odor - foul;Serous    Treatment Debridement (Selective);Cleansed    Selective Debridement (non-excisional) - Location Rt  LE  wound base and peeling skin throughout RLE    Selective Debridement (non-excisional) - Tools Used Forceps    Selective Debridement (non-excisional) - Tissue Removed slough and dry skin    Wound Therapy - Clinical Statement see below    Wound Therapy - Functional Problem List bathing, dressing    Factors Delaying/Impairing Wound Healing Altered sensation;Diabetes Mellitus;Vascular compromise;Multiple medical problems    Hydrotherapy Plan Debridement;Dressing change;Patient/family education    Wound Therapy - Frequency 2X / week    Wound Therapy - Current Recommendations PT    Wound Plan See below; follow up with MD regarding non healing wounds.    Dressing  vaseline,silver hydrofiber to weeping areas, plain calcium alginate to absorb drainage, 4X4, ABD, kerlix, cotton, netting #5                  PATIENT EDUCATION: Education details: 01/01/23:   Contact MD and return due to wounds are not healing. 11/21/22, POC, changing dressing if soiled; EVAL Patient educated on exam findings, POC, scope of PT, HEP, and keeping dressing on unless soiled or panful. Person educated: Patient Education method: Explanation, Demonstration, and Handouts Education comprehension: verbalized understanding, returned demonstration, verbal cues required, and tactile cues required   HOME EXERCISE PROGRAM: N/a   GOALS: Goals reviewed with patient? Yes  SHORT TERM GOALS: Target date: 10/25/2022    Wound to be free from slough to demonstrate healing.  Baseline: Goal status: IN PROGRESS   LONG TERM GOALS: Target date: 11/15/2022    Patient wound to be healed to reduce risk of infection. Baseline:  Goal status: IN PROGRESS  2.  Patient will state importance of compression garment use and be able to don to reduce risk of further wound development.  Baseline:  Goal status: IN PROGRESS   ASSESSMENT:  CLINICAL IMPRESSION: Pt returned today with dressing intact.  Photographs taken this session in media of LE upon dressing removal and following debridement.  LE has shown  no measurable improvements, condition of LE and odor of wounds/drainage remains despite different dressings. Suggested to patient she return to MD as wound has not changed in over 3 months.  Therapist did fax order for wound culture over to MD as may benefit from systemic treatment approach.  LE with yellow, flaking dryness mixed with excoriation/weeping in others.  Following 40 minutes of debridement to remove flaking skin, red/rawness remains. Lateral LE with beads of weeping following cleansing.  Pt does not have any pain in Rt LE, only itchiness.   Continued with a hydrofiber to abdsorb drainage followed by extra padding, kerlix and coban for compression.    OBJECTIVE IMPAIRMENTS: Abnormal gait, decreased activity tolerance, decreased balance, decreased mobility, difficulty walking,  impaired sensation, and improper body mechanics.   ACTIVITY LIMITATIONS: carrying, lifting, standing, squatting, stairs, transfers, locomotion level, and caring for others  PARTICIPATION LIMITATIONS: meal prep, cleaning, laundry, shopping, community activity, and yard work  PERSONAL FACTORS: 3+ comorbidities: DM, CKD, HLD, HTN  are also affecting patient's functional outcome.   REHAB POTENTIAL: Good  CLINICAL DECISION MAKING: Evolving/moderate complexity  EVALUATION COMPLEXITY: Moderate  PLAN: PT FREQUENCY: 2x/week  PT DURATION: 4 weeks  PLANNED INTERVENTIONS: Therapeutic exercises, Therapeutic activity, Neuromuscular re-education, Balance training, Gait training, Patient/Family education, Joint manipulation, Joint mobilization, Stair training, Orthotic/Fit training, DME instructions, Aquatic Therapy, Dry Needling, Electrical stimulation, Spinal manipulation, Spinal mobilization, Cryotherapy, Moist heat, Compression bandaging, scar mobilization, Splintting, Taping, Traction, Ultrasound, Ionotophoresis '4mg'$ /ml Dexamethasone, and Manual therapy   PLAN FOR NEXT SESSION: Follow up with MD regarding further studies/options.  Complete wound culture if order received.    2:48 PM, 01/01/23 Teena Irani, PTA/CLT [place] Ph: [phone]

## 2023-01-02 DIAGNOSIS — D638 Anemia in other chronic diseases classified elsewhere: Secondary | ICD-10-CM | POA: Diagnosis not present

## 2023-01-02 DIAGNOSIS — E1122 Type 2 diabetes mellitus with diabetic chronic kidney disease: Secondary | ICD-10-CM | POA: Diagnosis not present

## 2023-01-02 DIAGNOSIS — R809 Proteinuria, unspecified: Secondary | ICD-10-CM | POA: Diagnosis not present

## 2023-01-02 DIAGNOSIS — E871 Hypo-osmolality and hyponatremia: Secondary | ICD-10-CM | POA: Diagnosis not present

## 2023-01-02 DIAGNOSIS — I129 Hypertensive chronic kidney disease with stage 1 through stage 4 chronic kidney disease, or unspecified chronic kidney disease: Secondary | ICD-10-CM | POA: Diagnosis not present

## 2023-01-02 DIAGNOSIS — N189 Chronic kidney disease, unspecified: Secondary | ICD-10-CM | POA: Diagnosis not present

## 2023-01-02 DIAGNOSIS — E875 Hyperkalemia: Secondary | ICD-10-CM | POA: Diagnosis not present

## 2023-01-02 DIAGNOSIS — E1129 Type 2 diabetes mellitus with other diabetic kidney complication: Secondary | ICD-10-CM | POA: Diagnosis not present

## 2023-01-02 DIAGNOSIS — I5032 Chronic diastolic (congestive) heart failure: Secondary | ICD-10-CM | POA: Diagnosis not present

## 2023-01-04 ENCOUNTER — Other Ambulatory Visit (HOSPITAL_COMMUNITY)
Admission: RE | Admit: 2023-01-04 | Discharge: 2023-01-04 | Disposition: A | Discharge status: Home / Self Care | Payer: Medicare HMO | Attending: *Deleted | Admitting: *Deleted

## 2023-01-04 ENCOUNTER — Ambulatory Visit (HOSPITAL_COMMUNITY): Payer: Medicare HMO | Admitting: Physical Therapy

## 2023-01-04 DIAGNOSIS — E11621 Type 2 diabetes mellitus with foot ulcer: Secondary | ICD-10-CM

## 2023-01-04 DIAGNOSIS — N189 Chronic kidney disease, unspecified: Secondary | ICD-10-CM | POA: Diagnosis not present

## 2023-01-04 DIAGNOSIS — I87311 Chronic venous hypertension (idiopathic) with ulcer of right lower extremity: Secondary | ICD-10-CM | POA: Insufficient documentation

## 2023-01-04 DIAGNOSIS — D638 Anemia in other chronic diseases classified elsewhere: Secondary | ICD-10-CM | POA: Diagnosis not present

## 2023-01-04 DIAGNOSIS — R809 Proteinuria, unspecified: Secondary | ICD-10-CM | POA: Diagnosis not present

## 2023-01-04 DIAGNOSIS — I129 Hypertensive chronic kidney disease with stage 1 through stage 4 chronic kidney disease, or unspecified chronic kidney disease: Secondary | ICD-10-CM | POA: Diagnosis not present

## 2023-01-04 DIAGNOSIS — E1122 Type 2 diabetes mellitus with diabetic chronic kidney disease: Secondary | ICD-10-CM | POA: Diagnosis not present

## 2023-01-04 DIAGNOSIS — E1129 Type 2 diabetes mellitus with other diabetic kidney complication: Secondary | ICD-10-CM | POA: Diagnosis not present

## 2023-01-04 DIAGNOSIS — L97519 Non-pressure chronic ulcer of other part of right foot with unspecified severity: Secondary | ICD-10-CM | POA: Insufficient documentation

## 2023-01-04 DIAGNOSIS — E871 Hypo-osmolality and hyponatremia: Secondary | ICD-10-CM | POA: Diagnosis not present

## 2023-01-04 DIAGNOSIS — E875 Hyperkalemia: Secondary | ICD-10-CM | POA: Diagnosis not present

## 2023-01-04 DIAGNOSIS — R2689 Other abnormalities of gait and mobility: Secondary | ICD-10-CM | POA: Diagnosis not present

## 2023-01-04 NOTE — Therapy (Signed)
OUTPATIENT PHYSICAL THERAPY Wound Treatment Patient Name: Krista Payne MRN: [id number] DOB:September 18, 1941, 82 y.o., female Today's Date: 01/04/2023   PCP: Celene Squibb, MD REFERRING PROVIDER: Celene Squibb, MD  END OF SESSION:   PT End of Session - 01/04/23 1529     Visit Number 16    Number of Visits 28    Date for PT Re-Evaluation 01/15/23    Authorization Type Primary: Aetna Medicare Secondary: Tricare (no auth, no VL)    Progress Note Due on Visit 19    PT Start Time 1115    PT Stop Time 1150    PT Time Calculation (min) 35 min    Activity Tolerance Patient tolerated treatment well    Behavior During Therapy WFL for tasks assessed/performed                       Past Medical History:  Diagnosis Date   Breast cancer ([place])    R   Chronic kidney disease    Diabetes mellitus without complication ([place])    Hyperlipidemia    Hypertension    Macular degeneration    Osteoarthritis    Past Surgical History:  Procedure Laterality Date   CATARACT EXTRACTION W/PHACO Right 02/07/2015   Procedure: CATARACT EXTRACTION PHACO AND INTRAOCULAR LENS PLACEMENT ([place]);  Surgeon: Tonny Branch, MD;  Location: [facility];  Service: Ophthalmology;  Laterality: Right;  CDE:12.72   CATARACT EXTRACTION W/PHACO Left 02/24/2015   Procedure: CATARACT EXTRACTION PHACO AND INTRAOCULAR LENS PLACEMENT LEFT EYE;  Surgeon: Tonny Branch, MD;  Location: [facility];  Service: Ophthalmology;  Laterality: Left;  CDE 11.69   COLONOSCOPY     Patient Active Problem List   Diagnosis Date Noted   Unspecified Escherichia coli (E. coli) as the cause of diseases classified elsewhere 02/23/2022   Sepsis ([place]) 02/22/2022   Acute kidney failure ([place]) 02/22/2022   Fall at home, initial encounter 02/22/2022   Personal history of COVID-19 02/02/2022   Pressure injury of skin 11/15/2021   Depression, unspecified 11/15/2021   Hypertensive heart and chronic kidney disease with heart failure and stage 1 through stage 4 chronic  kidney disease, or unspecified chronic kidney disease ([place]) 11/15/2021   Insomnia 11/15/2021   Muscle weakness (generalized) 11/15/2021   Other abnormalities of gait and mobility 11/15/2021   Other forms of acute ischemic heart disease 11/15/2021   Traumatic ischemia of muscle ([place]) [id number]   Diastolic heart failure ([place]) 11/15/2021   Diabetic foot infection ([place]) [id number]   Acute metabolic encephalopathy [id number]   Chronic kidney disease, stage 3b ([place]) 11/14/2021   Type 2 diabetes with nephropathy ([place]) 11/14/2021   Elevated troponin 11/13/2021   Lactic acidosis 11/13/2021   COVID-19 virus infection 11/13/2021   Hyperglycemia due to diabetes mellitus ([place]) 11/13/2021   Ankle wound, right, initial encounter 11/13/2021   Altered mental status 11/13/2021   Dehydration 11/13/2021   Long term (current) use of oral hypoglycemic drugs 10/29/2021   Unspecified macular degeneration 10/29/2021   Chronic kidney disease 05/17/2021   Personal history of malignant neoplasm of breast 10/29/2020   Malignant neoplasm of unspecified site of right female breast ([place]) 07/16/2019   URI (upper respiratory infection) 11/20/2011   Other seasonal allergic rhinitis 11/23/2010   Urinary tract infection without hematuria 11/23/2010   RENAL INSUFFICIENCY 10/26/2010   OSTEOARTHRITIS 10/18/2010   Osteoarthritis 10/18/2010   Vitamin D deficiency 08/10/2009   Hyperlipidemia 08/10/2009   Body mass index (BMI) 39.0-39.9, adult 08/10/2009   EDEMA  01/20/2008   Anemia in chronic kidney disease 06/19/2007   Hypertension 06/02/2007    ONSET DATE: 07/05/22  REFERRING DIAG: ulcer of right foot due to type 2 diabetes mellitus  THERAPY DIAG:  Ulcer of right foot due to type 2 diabetes mellitus ([place])  Other abnormalities of gait and mobility  Rationale for Evaluation and Treatment: Rehabilitation     Wound Therapy - 01/04/23 0001     Subjective PT states that she feels the dressing was placed to tightly     Patient and Family Stated Goals wound to heal    Date of Onset 07/05/22    Prior Treatments cream    Pain Scale 0-10    Pain Score 2     Pain Type acute   Pain Location Leg    Pain Orientation Lower    Evaluation and Treatment Procedures Explained to Patient/Family Yes    Evaluation and Treatment Procedures agreed to    Wound Properties Date First Assessed: 10/04/22 Time First Assessed: 1345 Wound Type: Venous stasis ulcer Location: Pretibial Location Orientation: Distal;Right Wound Description (Comments): medial ankle wound superior Present on Admission: Yes   Dressing Type Gauze (Comment);Silver hydrofiber;Compression wrap   vaseline,  xeroform, extra cotton, kerlix, netting #5   Dressing Status Old drainage    Dressing Change Frequency PRN    Site / Wound Assessment Yellow;Pink;Red    % Wound base Other/Granulation Tissue (Comment) 100%    Peri-wound Assessment Edema;Erythema (blanchable)    Drainage Amount Minimal    Drainage Description Odor - foul   Leg is dry but enough drainage between sessions to soak dressing.   Treatment Debridement (Selective)    Selective Debridement (non-excisional) - Location Rt  LE  wound base and peeling skin throughout RLE    Selective Debridement (non-excisional) - Tools Used Forceps    Selective Debridement (non-excisional) - Tissue Removed slough and dry skin    Wound Therapy - Clinical Statement see below    Wound Therapy - Functional Problem List bathing, dressing    Factors Delaying/Impairing Wound Healing Altered sensation;Diabetes Mellitus;Vascular compromise;Multiple medical problems    Hydrotherapy Plan Debridement;Dressing change;Patient/family education    Wound Therapy - Frequency 2X / week    Wound Therapy - Current Recommendations PT    Wound Plan See below; follow up with MD regarding non healing wounds.    Dressing  vaseline to entire LE followed by silfver hydrofiber, kerlix, cotton and coban                  PATIENT  EDUCATION: Education details: 01/01/23:  Contact MD and return due to wounds are not healing. 11/21/22, POC, changing dressing if soiled; EVAL Patient educated on exam findings, POC, scope of PT, HEP, and keeping dressing on unless soiled or panful. Person educated: Patient Education method: Explanation, Demonstration, and Handouts Education comprehension: verbalized understanding, returned demonstration, verbal cues required, and tactile cues required   HOME EXERCISE PROGRAM: N/a   GOALS: Goals reviewed with patient? Yes  SHORT TERM GOALS: Target date: 10/25/2022    Wound to be free from slough to demonstrate healing.  Baseline: Goal status: IN PROGRESS   LONG TERM GOALS: Target date: 11/15/2022    Patient wound to be healed to reduce risk of infection. Baseline:  Goal status: IN PROGRESS  2.  Patient will state importance of compression garment use and be able to don to reduce risk of further wound development.  Baseline:  Goal status: IN PROGRESS   ASSESSMENT:  CLINICAL  IMPRESSION: Pt continues with  LE with yellow, flaking dryness mixed with excoriation/weeping in others.  LE cultured and culture taken to lab.  Debrided dry skin and yellow slough.   Continued with a hydrofiber to abdsorb drainage followed by extra padding, kerlix and coban for compression.    OBJECTIVE IMPAIRMENTS: Abnormal gait, decreased activity tolerance, decreased balance, decreased mobility, difficulty walking, impaired sensation, and improper body mechanics.   ACTIVITY LIMITATIONS: carrying, lifting, standing, squatting, stairs, transfers, locomotion level, and caring for others  PARTICIPATION LIMITATIONS: meal prep, cleaning, laundry, shopping, community activity, and yard work  PERSONAL FACTORS: 3+ comorbidities: DM, CKD, HLD, HTN  are also affecting patient's functional outcome.   REHAB POTENTIAL: Good  CLINICAL DECISION MAKING: Evolving/moderate complexity  EVALUATION COMPLEXITY:  Moderate  PLAN: PT FREQUENCY: 2x/week  PT DURATION: 4 weeks  PLANNED INTERVENTIONS: Therapeutic exercises, Therapeutic activity, Neuromuscular re-education, Balance training, Gait training, Patient/Family education, Joint manipulation, Joint mobilization, Stair training, Orthotic/Fit training, DME instructions, Aquatic Therapy, Dry Needling, Electrical stimulation, Spinal manipulation, Spinal mobilization, Cryotherapy, Moist heat, Compression bandaging, scar mobilization, Splintting, Taping, Traction, Ultrasound, Ionotophoresis '4mg'$ /ml Dexamethasone, and Manual therapy   PLAN FOR NEXT SESSION: Follow up with MD regarding further studies/options.  Complete wound culture if order received.    Rayetta Humphrey, [place] CLT [phone]  [phone]

## 2023-01-07 ENCOUNTER — Encounter (HOSPITAL_COMMUNITY): Payer: Self-pay | Admitting: Physical Therapy

## 2023-01-07 ENCOUNTER — Ambulatory Visit (HOSPITAL_COMMUNITY): Payer: Medicare HMO | Admitting: Physical Therapy

## 2023-01-07 DIAGNOSIS — L97519 Non-pressure chronic ulcer of other part of right foot with unspecified severity: Secondary | ICD-10-CM | POA: Diagnosis not present

## 2023-01-07 DIAGNOSIS — R2689 Other abnormalities of gait and mobility: Secondary | ICD-10-CM

## 2023-01-07 DIAGNOSIS — E11621 Type 2 diabetes mellitus with foot ulcer: Secondary | ICD-10-CM

## 2023-01-07 NOTE — Therapy (Signed)
OUTPATIENT PHYSICAL THERAPY Wound Treatment Patient Name: Krista Payne MRN: [id number] DOB:23-Dec-1940, 82 y.o., female Today's Date: 01/07/2023   PCP: Celene Squibb, MD REFERRING PROVIDER: Celene Squibb, MD  END OF SESSION:   PT End of Session - 01/07/23 1350     Visit Number 17    Number of Visits 28    Date for PT Re-Evaluation 01/15/23    Authorization Type Primary: Aetna Medicare Secondary: Tricare (no auth, no VL)    Progress Note Due on Visit 19    PT Start Time 1350    PT Stop Time 1425    PT Time Calculation (min) 35 min    Activity Tolerance Patient tolerated treatment well    Behavior During Therapy WFL for tasks assessed/performed                       Past Medical History:  Diagnosis Date   Breast cancer ([place])    R   Chronic kidney disease    Diabetes mellitus without complication ([place])    Hyperlipidemia    Hypertension    Macular degeneration    Osteoarthritis    Past Surgical History:  Procedure Laterality Date   CATARACT EXTRACTION W/PHACO Right 02/07/2015   Procedure: CATARACT EXTRACTION PHACO AND INTRAOCULAR LENS PLACEMENT ([place]);  Surgeon: Tonny Branch, MD;  Location: [facility];  Service: Ophthalmology;  Laterality: Right;  CDE:12.72   CATARACT EXTRACTION W/PHACO Left 02/24/2015   Procedure: CATARACT EXTRACTION PHACO AND INTRAOCULAR LENS PLACEMENT LEFT EYE;  Surgeon: Tonny Branch, MD;  Location: [facility];  Service: Ophthalmology;  Laterality: Left;  CDE 11.69   COLONOSCOPY     Patient Active Problem List   Diagnosis Date Noted   Unspecified Escherichia coli (E. coli) as the cause of diseases classified elsewhere 02/23/2022   Sepsis ([place]) 02/22/2022   Acute kidney failure ([place]) 02/22/2022   Fall at home, initial encounter 02/22/2022   Personal history of COVID-19 02/02/2022   Pressure injury of skin 11/15/2021   Depression, unspecified 11/15/2021   Hypertensive heart and chronic kidney disease with heart failure and stage 1 through stage 4 chronic  kidney disease, or unspecified chronic kidney disease ([place]) 11/15/2021   Insomnia 11/15/2021   Muscle weakness (generalized) 11/15/2021   Other abnormalities of gait and mobility 11/15/2021   Other forms of acute ischemic heart disease 11/15/2021   Traumatic ischemia of muscle ([place]) [id number]   Diastolic heart failure ([place]) 11/15/2021   Diabetic foot infection ([place]) [id number]   Acute metabolic encephalopathy [id number]   Chronic kidney disease, stage 3b ([place]) 11/14/2021   Type 2 diabetes with nephropathy ([place]) 11/14/2021   Elevated troponin 11/13/2021   Lactic acidosis 11/13/2021   COVID-19 virus infection 11/13/2021   Hyperglycemia due to diabetes mellitus ([place]) 11/13/2021   Ankle wound, right, initial encounter 11/13/2021   Altered mental status 11/13/2021   Dehydration 11/13/2021   Long term (current) use of oral hypoglycemic drugs 10/29/2021   Unspecified macular degeneration 10/29/2021   Chronic kidney disease 05/17/2021   Personal history of malignant neoplasm of breast 10/29/2020   Malignant neoplasm of unspecified site of right female breast ([place]) 07/16/2019   URI (upper respiratory infection) 11/20/2011   Other seasonal allergic rhinitis 11/23/2010   Urinary tract infection without hematuria 11/23/2010   RENAL INSUFFICIENCY 10/26/2010   OSTEOARTHRITIS 10/18/2010   Osteoarthritis 10/18/2010   Vitamin D deficiency 08/10/2009   Hyperlipidemia 08/10/2009   Body mass index (BMI) 39.0-39.9, adult 08/10/2009   EDEMA  01/20/2008   Anemia in chronic kidney disease 06/19/2007   Hypertension 06/02/2007    ONSET DATE: 07/05/22  REFERRING DIAG: ulcer of right foot due to type 2 diabetes mellitus  THERAPY DIAG:  Ulcer of right foot due to type 2 diabetes mellitus ([place])  Other abnormalities of gait and mobility  Rationale for Evaluation and Treatment: Rehabilitation       Wound Therapy - 01/07/23 0001     Subjective PT states that her leg is feeling alright, just itchy     Patient and Family Stated Goals wound to heal    Date of Onset 07/05/22    Prior Treatments cream    Pain Score 0-No pain    Evaluation and Treatment Procedures Explained to Patient/Family Yes    Evaluation and Treatment Procedures agreed to    Wound Properties Date First Assessed: 10/04/22 Time First Assessed: 1345 Wound Type: Venous stasis ulcer Location: Pretibial Location Orientation: Distal;Right Wound Description (Comments): medial ankle wound superior Present on Admission: Yes   Dressing Type Gauze (Comment);Silver hydrofiber;Compression wrap   vaseline,  xeroform, extra cotton, kerlix, netting #5   Dressing Changed Changed    Dressing Status Old drainage    Dressing Change Frequency PRN    Site / Wound Assessment Yellow;Pink;Red    Peri-wound Assessment Edema;Erythema (blanchable)    Drainage Amount Minimal    Drainage Description Odor - foul    Treatment Cleansed;Debridement (Selective)    Selective Debridement (non-excisional) - Location Rt  LE  wound base and peeling skin throughout RLE    Selective Debridement (non-excisional) - Tools Used Forceps    Selective Debridement (non-excisional) - Tissue Removed slough and dry skin    Wound Therapy - Clinical Statement see below    Wound Therapy - Functional Problem List bathing, dressing    Factors Delaying/Impairing Wound Healing Altered sensation;Diabetes Mellitus;Vascular compromise;Multiple medical problems    Hydrotherapy Plan Debridement;Dressing change;Patient/family education    Wound Therapy - Frequency 2X / week    Wound Therapy - Current Recommendations PT    Wound Plan See below; follow up with MD regarding non healing wounds.    Dressing  vaseline to entire LE followed by silfver hydrofiber, kerlix, cotton and coban                  PATIENT EDUCATION: Education details: 01/01/23:  Contact MD and return due to wounds are not healing. 11/21/22, POC, changing dressing if soiled; EVAL Patient educated on exam  findings, POC, scope of PT, HEP, and keeping dressing on unless soiled or panful. Person educated: Patient Education method: Explanation, Demonstration, and Handouts Education comprehension: verbalized understanding, returned demonstration, verbal cues required, and tactile cues required   HOME EXERCISE PROGRAM: N/a   GOALS: Goals reviewed with patient? Yes  SHORT TERM GOALS: Target date: 10/25/2022    Wound to be free from slough to demonstrate healing.  Baseline: Goal status: IN PROGRESS   LONG TERM GOALS: Target date: 11/15/2022    Patient wound to be healed to reduce risk of infection. Baseline:  Goal status: IN PROGRESS  2.  Patient will state importance of compression garment use and be able to don to reduce risk of further wound development.  Baseline:  Goal status: IN PROGRESS   ASSESSMENT:  CLINICAL IMPRESSION: Pt continues with  LE with yellow, flaking dryness mixed with excoriation/weeping in others.  Per chart review, culture revealing need for antibiotics. Educated patient on f/u with with MD regarding antibiotics. Continued with cleansing and debriding  of RLE. Continued with same dressing to manage symptoms with hopes that patient will be able to start antibiotics and RLE will improve.   OBJECTIVE IMPAIRMENTS: Abnormal gait, decreased activity tolerance, decreased balance, decreased mobility, difficulty walking, impaired sensation, and improper body mechanics.   ACTIVITY LIMITATIONS: carrying, lifting, standing, squatting, stairs, transfers, locomotion level, and caring for others  PARTICIPATION LIMITATIONS: meal prep, cleaning, laundry, shopping, community activity, and yard work  PERSONAL FACTORS: 3+ comorbidities: DM, CKD, HLD, HTN  are also affecting patient's functional outcome.   REHAB POTENTIAL: Good  CLINICAL DECISION MAKING: Evolving/moderate complexity  EVALUATION COMPLEXITY: Moderate  PLAN: PT FREQUENCY: 2x/week  PT DURATION: 4  weeks  PLANNED INTERVENTIONS: Therapeutic exercises, Therapeutic activity, Neuromuscular re-education, Balance training, Gait training, Patient/Family education, Joint manipulation, Joint mobilization, Stair training, Orthotic/Fit training, DME instructions, Aquatic Therapy, Dry Needling, Electrical stimulation, Spinal manipulation, Spinal mobilization, Cryotherapy, Moist heat, Compression bandaging, scar mobilization, Splintting, Taping, Traction, Ultrasound, Ionotophoresis '4mg'$ /ml Dexamethasone, and Manual therapy   PLAN FOR NEXT SESSION: Follow up with MD regarding further studies/options.  Complete wound culture if order received.    2:28 PM, 01/07/23 Mearl Latin PT, DPT Physical Therapist at [facility]

## 2023-01-08 DIAGNOSIS — E1165 Type 2 diabetes mellitus with hyperglycemia: Secondary | ICD-10-CM | POA: Diagnosis not present

## 2023-01-09 DIAGNOSIS — R600 Localized edema: Secondary | ICD-10-CM | POA: Diagnosis not present

## 2023-01-09 DIAGNOSIS — M8580 Other specified disorders of bone density and structure, unspecified site: Secondary | ICD-10-CM | POA: Diagnosis not present

## 2023-01-09 DIAGNOSIS — L0390 Cellulitis, unspecified: Secondary | ICD-10-CM | POA: Diagnosis not present

## 2023-01-09 DIAGNOSIS — E1122 Type 2 diabetes mellitus with diabetic chronic kidney disease: Secondary | ICD-10-CM | POA: Diagnosis not present

## 2023-01-09 DIAGNOSIS — I87332 Chronic venous hypertension (idiopathic) with ulcer and inflammation of left lower extremity: Secondary | ICD-10-CM | POA: Diagnosis not present

## 2023-01-09 DIAGNOSIS — L89151 Pressure ulcer of sacral region, stage 1: Secondary | ICD-10-CM | POA: Diagnosis not present

## 2023-01-09 DIAGNOSIS — N184 Chronic kidney disease, stage 4 (severe): Secondary | ICD-10-CM | POA: Diagnosis not present

## 2023-01-09 DIAGNOSIS — R69 Illness, unspecified: Secondary | ICD-10-CM | POA: Diagnosis not present

## 2023-01-09 DIAGNOSIS — I10 Essential (primary) hypertension: Secondary | ICD-10-CM | POA: Diagnosis not present

## 2023-01-09 DIAGNOSIS — E875 Hyperkalemia: Secondary | ICD-10-CM | POA: Diagnosis not present

## 2023-01-09 LAB — AEROBIC CULTURE W GRAM STAIN (SUPERFICIAL SPECIMEN): Gram Stain: NONE SEEN

## 2023-01-10 ENCOUNTER — Encounter (HOSPITAL_COMMUNITY): Payer: Self-pay | Admitting: Physical Therapy

## 2023-01-10 ENCOUNTER — Ambulatory Visit (HOSPITAL_COMMUNITY): Payer: Medicare HMO | Admitting: Physical Therapy

## 2023-01-10 DIAGNOSIS — R2689 Other abnormalities of gait and mobility: Secondary | ICD-10-CM | POA: Diagnosis not present

## 2023-01-10 DIAGNOSIS — E11621 Type 2 diabetes mellitus with foot ulcer: Secondary | ICD-10-CM

## 2023-01-10 DIAGNOSIS — L97519 Non-pressure chronic ulcer of other part of right foot with unspecified severity: Secondary | ICD-10-CM | POA: Diagnosis not present

## 2023-01-10 NOTE — Therapy (Signed)
OUTPATIENT PHYSICAL THERAPY Wound Treatment Patient Name: Krista Payne MRN: [id number] DOB:09-20-1941, 82 y.o., female Today's Date: 01/10/2023   PCP: Celene Squibb, MD REFERRING PROVIDER: Celene Squibb, MD  END OF SESSION:   PT End of Session - 01/10/23 1030     Visit Number 18    Number of Visits 28    Date for PT Re-Evaluation 01/15/23    Authorization Type Primary: Aetna Medicare Secondary: Tricare (no auth, no VL)    Progress Note Due on Visit 19    PT Start Time 1030    PT Stop Time 1112    PT Time Calculation (min) 42 min    Activity Tolerance Patient tolerated treatment well    Behavior During Therapy WFL for tasks assessed/performed                       Past Medical History:  Diagnosis Date   Breast cancer ([place])    R   Chronic kidney disease    Diabetes mellitus without complication ([place])    Hyperlipidemia    Hypertension    Macular degeneration    Osteoarthritis    Past Surgical History:  Procedure Laterality Date   CATARACT EXTRACTION W/PHACO Right 02/07/2015   Procedure: CATARACT EXTRACTION PHACO AND INTRAOCULAR LENS PLACEMENT ([place]);  Surgeon: Tonny Branch, MD;  Location: [facility];  Service: Ophthalmology;  Laterality: Right;  CDE:12.72   CATARACT EXTRACTION W/PHACO Left 02/24/2015   Procedure: CATARACT EXTRACTION PHACO AND INTRAOCULAR LENS PLACEMENT LEFT EYE;  Surgeon: Tonny Branch, MD;  Location: [facility];  Service: Ophthalmology;  Laterality: Left;  CDE 11.69   COLONOSCOPY     Patient Active Problem List   Diagnosis Date Noted   Unspecified Escherichia coli (E. coli) as the cause of diseases classified elsewhere 02/23/2022   Sepsis ([place]) 02/22/2022   Acute kidney failure ([place]) 02/22/2022   Fall at home, initial encounter 02/22/2022   Personal history of COVID-19 02/02/2022   Pressure injury of skin 11/15/2021   Depression, unspecified 11/15/2021   Hypertensive heart and chronic kidney disease with heart failure and stage 1 through stage 4 chronic  kidney disease, or unspecified chronic kidney disease ([place]) 11/15/2021   Insomnia 11/15/2021   Muscle weakness (generalized) 11/15/2021   Other abnormalities of gait and mobility 11/15/2021   Other forms of acute ischemic heart disease 11/15/2021   Traumatic ischemia of muscle ([place]) [id number]   Diastolic heart failure ([place]) 11/15/2021   Diabetic foot infection ([place]) [id number]   Acute metabolic encephalopathy [id number]   Chronic kidney disease, stage 3b ([place]) 11/14/2021   Type 2 diabetes with nephropathy ([place]) 11/14/2021   Elevated troponin 11/13/2021   Lactic acidosis 11/13/2021   COVID-19 virus infection 11/13/2021   Hyperglycemia due to diabetes mellitus ([place]) 11/13/2021   Ankle wound, right, initial encounter 11/13/2021   Altered mental status 11/13/2021   Dehydration 11/13/2021   Long term (current) use of oral hypoglycemic drugs 10/29/2021   Unspecified macular degeneration 10/29/2021   Chronic kidney disease 05/17/2021   Personal history of malignant neoplasm of breast 10/29/2020   Malignant neoplasm of unspecified site of right female breast ([place]) 07/16/2019   URI (upper respiratory infection) 11/20/2011   Other seasonal allergic rhinitis 11/23/2010   Urinary tract infection without hematuria 11/23/2010   RENAL INSUFFICIENCY 10/26/2010   OSTEOARTHRITIS 10/18/2010   Osteoarthritis 10/18/2010   Vitamin D deficiency 08/10/2009   Hyperlipidemia 08/10/2009   Body mass index (BMI) 39.0-39.9, adult 08/10/2009   EDEMA  01/20/2008   Anemia in chronic kidney disease 06/19/2007   Hypertension 06/02/2007    ONSET DATE: 07/05/22  REFERRING DIAG: ulcer of right foot due to type 2 diabetes mellitus  THERAPY DIAG:  Ulcer of right foot due to type 2 diabetes mellitus ([place])  Other abnormalities of gait and mobility  Rationale for Evaluation and Treatment: Rehabilitation       Wound Therapy - 01/10/23 0001     Subjective PT states she went to get her antibiotics but wasnt  ready yet. Will go back later    Patient and Family Stated Goals wound to heal    Date of Onset 07/05/22    Prior Treatments cream    Pain Score 0-No pain    Evaluation and Treatment Procedures Explained to Patient/Family Yes    Evaluation and Treatment Procedures agreed to    Wound Properties Date First Assessed: 10/04/22 Time First Assessed: 1345 Wound Type: Venous stasis ulcer Location: Pretibial Location Orientation: Distal;Right Wound Description (Comments): medial ankle wound superior Present on Admission: Yes   Dressing Type Gauze (Comment);Silver hydrofiber;Compression wrap   vaseline,  xeroform, extra cotton, kerlix, netting #5   Dressing Changed Changed    Dressing Status Old drainage    Dressing Change Frequency PRN    Site / Wound Assessment Yellow;Pink;Red    Peri-wound Assessment Edema;Erythema (blanchable)    Drainage Amount Minimal    Drainage Description Serous;Odor - foul    Treatment Cleansed;Debridement (Selective)    Selective Debridement (non-excisional) - Location Rt  LE  wound base and peeling skin throughout RLE    Selective Debridement (non-excisional) - Tools Used Forceps    Selective Debridement (non-excisional) - Tissue Removed slough and dry skin    Wound Therapy - Clinical Statement see below    Wound Therapy - Functional Problem List bathing, dressing    Factors Delaying/Impairing Wound Healing Altered sensation;Diabetes Mellitus;Vascular compromise;Multiple medical problems    Hydrotherapy Plan Debridement;Dressing change;Patient/family education    Wound Therapy - Frequency 2X / week    Wound Therapy - Current Recommendations PT    Wound Plan See below; follow up with MD regarding non healing wounds.    Dressing  vaseline to entire LE followed by silfver hydrofiber, kerlix, cotton and coban                  PATIENT EDUCATION: Education details: 01/01/23:  Contact MD and return due to wounds are not healing. 11/21/22, POC, changing dressing if  soiled; EVAL Patient educated on exam findings, POC, scope of PT, HEP, and keeping dressing on unless soiled or panful. Person educated: Patient Education method: Explanation, Demonstration, and Handouts Education comprehension: verbalized understanding, returned demonstration, verbal cues required, and tactile cues required   HOME EXERCISE PROGRAM: N/a   GOALS: Goals reviewed with patient? Yes  SHORT TERM GOALS: Target date: 10/25/2022    Wound to be free from slough to demonstrate healing.  Baseline: Goal status: IN PROGRESS   LONG TERM GOALS: Target date: 11/15/2022    Patient wound to be healed to reduce risk of infection. Baseline:  Goal status: IN PROGRESS  2.  Patient will state importance of compression garment use and be able to don to reduce risk of further wound development.  Baseline:  Goal status: IN PROGRESS   ASSESSMENT:  CLINICAL IMPRESSION: Patient has antibiotic script but it was not ready when she went to pick it up. Extra padded dressing stayed up better with minimal sliding down. Pt continues with  LE  with yellow, flaking dryness mixed with excoriation/weeping in others. Cleansed and debrided as able. Leg still very red and weeping. Continued with same dressings and used foam instead of cotton today.   OBJECTIVE IMPAIRMENTS: Abnormal gait, decreased activity tolerance, decreased balance, decreased mobility, difficulty walking, impaired sensation, and improper body mechanics.   ACTIVITY LIMITATIONS: carrying, lifting, standing, squatting, stairs, transfers, locomotion level, and caring for others  PARTICIPATION LIMITATIONS: meal prep, cleaning, laundry, shopping, community activity, and yard work  PERSONAL FACTORS: 3+ comorbidities: DM, CKD, HLD, HTN  are also affecting patient's functional outcome.   REHAB POTENTIAL: Good  CLINICAL DECISION MAKING: Evolving/moderate complexity  EVALUATION COMPLEXITY: Moderate  PLAN: PT FREQUENCY: 2x/week  PT  DURATION: 4 weeks  PLANNED INTERVENTIONS: Therapeutic exercises, Therapeutic activity, Neuromuscular re-education, Balance training, Gait training, Patient/Family education, Joint manipulation, Joint mobilization, Stair training, Orthotic/Fit training, DME instructions, Aquatic Therapy, Dry Needling, Electrical stimulation, Spinal manipulation, Spinal mobilization, Cryotherapy, Moist heat, Compression bandaging, scar mobilization, Splintting, Taping, Traction, Ultrasound, Ionotophoresis '4mg'$ /ml Dexamethasone, and Manual therapy   PLAN FOR NEXT SESSION: Follow up with MD regarding further studies/options.  Complete wound culture if order received.    11:14 AM, 01/10/23 Mearl Latin PT, DPT Physical Therapist at [facility]

## 2023-01-14 ENCOUNTER — Ambulatory Visit (HOSPITAL_COMMUNITY): Payer: Medicare HMO | Admitting: Physical Therapy

## 2023-01-14 DIAGNOSIS — E11621 Type 2 diabetes mellitus with foot ulcer: Secondary | ICD-10-CM

## 2023-01-14 DIAGNOSIS — R2689 Other abnormalities of gait and mobility: Secondary | ICD-10-CM | POA: Diagnosis not present

## 2023-01-14 DIAGNOSIS — L97519 Non-pressure chronic ulcer of other part of right foot with unspecified severity: Secondary | ICD-10-CM | POA: Diagnosis not present

## 2023-01-14 NOTE — Therapy (Signed)
OUTPATIENT PHYSICAL THERAPY Wound Treatment Progress Note Reporting Period 12/18/2022 to 01/14/2023  See note below for Objective Data and Assessment of Progress/Goals.     Patient Name: Krista Payne MRN: [id number] DOB:1941/06/03, 82 y.o., female Today's Date: 01/14/2023   PCP: Celene Squibb, MD REFERRING PROVIDER: Celene Squibb, MD  END OF SESSION:   PT End of Session - 01/14/23 1403     Visit Number 19    Number of Visits 28    Date for PT Re-Evaluation 02/11/23    Authorization Type Primary: Aetna Medicare Secondary: Tricare (no auth, no VL)    Progress Note Due on Visit 29    PT Start Time 1305    PT Stop Time 1345    PT Time Calculation (min) 40 min    Activity Tolerance Patient tolerated treatment well    Behavior During Therapy WFL for tasks assessed/performed                       Past Medical History:  Diagnosis Date   Breast cancer ([place])    R   Chronic kidney disease    Diabetes mellitus without complication ([place])    Hyperlipidemia    Hypertension    Macular degeneration    Osteoarthritis    Past Surgical History:  Procedure Laterality Date   CATARACT EXTRACTION W/PHACO Right 02/07/2015   Procedure: CATARACT EXTRACTION PHACO AND INTRAOCULAR LENS PLACEMENT ([place]);  Surgeon: Tonny Branch, MD;  Location: [facility];  Service: Ophthalmology;  Laterality: Right;  CDE:12.72   CATARACT EXTRACTION W/PHACO Left 02/24/2015   Procedure: CATARACT EXTRACTION PHACO AND INTRAOCULAR LENS PLACEMENT LEFT EYE;  Surgeon: Tonny Branch, MD;  Location: [facility];  Service: Ophthalmology;  Laterality: Left;  CDE 11.69   COLONOSCOPY     Patient Active Problem List   Diagnosis Date Noted   Unspecified Escherichia coli (E. coli) as the cause of diseases classified elsewhere 02/23/2022   Sepsis ([place]) 02/22/2022   Acute kidney failure ([place]) 02/22/2022   Fall at home, initial encounter 02/22/2022   Personal history of COVID-19 02/02/2022   Pressure injury of skin 11/15/2021    Depression, unspecified 11/15/2021   Hypertensive heart and chronic kidney disease with heart failure and stage 1 through stage 4 chronic kidney disease, or unspecified chronic kidney disease ([place]) 11/15/2021   Insomnia 11/15/2021   Muscle weakness (generalized) 11/15/2021   Other abnormalities of gait and mobility 11/15/2021   Other forms of acute ischemic heart disease 11/15/2021   Traumatic ischemia of muscle ([place]) [id number]   Diastolic heart failure ([place]) 11/15/2021   Diabetic foot infection ([place]) [id number]   Acute metabolic encephalopathy [id number]   Chronic kidney disease, stage 3b ([place]) 11/14/2021   Type 2 diabetes with nephropathy ([place]) 11/14/2021   Elevated troponin 11/13/2021   Lactic acidosis 11/13/2021   COVID-19 virus infection 11/13/2021   Hyperglycemia due to diabetes mellitus ([place]) 11/13/2021   Ankle wound, right, initial encounter 11/13/2021   Altered mental status 11/13/2021   Dehydration 11/13/2021   Long term (current) use of oral hypoglycemic drugs 10/29/2021   Unspecified macular degeneration 10/29/2021   Chronic kidney disease 05/17/2021   Personal history of malignant neoplasm of breast 10/29/2020   Malignant neoplasm of unspecified site of right female breast ([place]) 07/16/2019   URI (upper respiratory infection) 11/20/2011   Other seasonal allergic rhinitis 11/23/2010   Urinary tract infection without hematuria 11/23/2010   RENAL INSUFFICIENCY 10/26/2010   OSTEOARTHRITIS 10/18/2010   Osteoarthritis 10/18/2010  Vitamin D deficiency 08/10/2009   Hyperlipidemia 08/10/2009   Body mass index (BMI) 39.0-39.9, adult 08/10/2009   EDEMA 01/20/2008   Anemia in chronic kidney disease 06/19/2007   Hypertension 06/02/2007    ONSET DATE: 07/05/22  REFERRING DIAG: ulcer of right foot due to type 2 diabetes mellitus  THERAPY DIAG:  Ulcer of right foot due to type 2 diabetes mellitus ([place])  Other abnormalities of gait and mobility  Rationale for Evaluation and  Treatment: Rehabilitation       Wound Therapy - 01/14/23 1425     Subjective Pt states she's been on her antibiotic since last visit.    Patient and Family Stated Goals wound to heal    Date of Onset 07/05/22    Prior Treatments cream    Pain Score 0-No pain    Evaluation and Treatment Procedures Explained to Patient/Family Yes    Evaluation and Treatment Procedures agreed to    Wound Properties Date First Assessed: 10/04/22 Time First Assessed: [id number] Wound Type: Venous stasis ulcer Location: Pretibial Location Orientation: Distal;Right Wound Description (Comments): medial ankle wound superior Present on Admission: Yes   Wound Image Images linked: 1    Dressing Type Gauze (Comment);Silver hydrofiber;Compression wrap   vaseline,  xeroform, extra cotton, kerlix, netting #5   Dressing Changed Changed    Dressing Status Old drainage    Dressing Change Frequency PRN    Site / Wound Assessment Yellow;Pink;Red    Peri-wound Assessment Edema;Erythema (blanchable)    Drainage Amount Scant    Drainage Description Serous;No odor    Treatment Cleansed;Debridement (Selective)    Selective Debridement (non-excisional) - Location Rt  LE  wound base and peeling skin throughout RLE    Selective Debridement (non-excisional) - Tools Used Forceps    Selective Debridement (non-excisional) - Tissue Removed slough and dry skin    Wound Therapy - Clinical Statement see below    Wound Therapy - Functional Problem List bathing, dressing    Factors Delaying/Impairing Wound Healing Altered sensation;Diabetes Mellitus;Vascular compromise;Multiple medical problems    Hydrotherapy Plan Debridement;Dressing change;Patient/family education    Wound Therapy - Frequency 2X / week    Wound Therapy - Current Recommendations PT    Wound Plan See below; follow up with MD regarding non healing wounds.    Dressing  cleansed with sterile gauze, dried with sterile gauze, lotion to dry areas, one sheet calcium alginate to  anterior LE, ABD pads, kerlix, foam, coban                PATIENT EDUCATION: Education details: 01/01/23:  Contact MD and return due to wounds are not healing. 11/21/22, POC, changing dressing if soiled; EVAL Patient educated on exam findings, POC, scope of PT, HEP, and keeping dressing on unless soiled or panful. Person educated: Patient Education method: Explanation, Demonstration, and Handouts Education comprehension: verbalized understanding, returned demonstration, verbal cues required, and tactile cues required   HOME EXERCISE PROGRAM: N/a   GOALS: Goals reviewed with patient? Yes  SHORT TERM GOALS: Target date: 10/25/2022    Wound to be free from slough to demonstrate healing.  Baseline: Goal status: IN PROGRESS   LONG TERM GOALS: Target date: 11/15/2022    Patient wound to be healed to reduce risk of infection. Baseline:  Goal status: IN PROGRESS  2.  Patient will state importance of compression garment use and be able to don to reduce risk of further wound development.  Baseline:  Goal status: IN PROGRESS   ASSESSMENT:  CLINICAL IMPRESSION:  Pt has been on 4 days of antibiotic.  Noted improvement upon dressing removal as no odor present and much less reduced drainage/weeping from LE.  Dryness, scaling still present but was easily removed.  LE is still red, however less irritation noted.  Cleansed area with only sterile gauze and dried as well prior to moisturizing.  Plain calcium alginate used with extra padding and foam to ensure even compression. LE photographed today in media.   Pt will continue to benefit from skilled woundcare to ensure healing and prevent infection.   OBJECTIVE IMPAIRMENTS: Abnormal gait, decreased activity tolerance, decreased balance, decreased mobility, difficulty walking, impaired sensation, and improper body mechanics.   ACTIVITY LIMITATIONS: carrying, lifting, standing, squatting, stairs, transfers, locomotion level, and caring for  others  PARTICIPATION LIMITATIONS: meal prep, cleaning, laundry, shopping, community activity, and yard work  PERSONAL FACTORS: 3+ comorbidities: DM, CKD, HLD, HTN  are also affecting patient's functional outcome.   REHAB POTENTIAL: Good  CLINICAL DECISION MAKING: Evolving/moderate complexity  EVALUATION COMPLEXITY: Moderate  PLAN: PT FREQUENCY: 2x/week  PT DURATION: 4 weeks  PLANNED INTERVENTIONS: Therapeutic exercises, Therapeutic activity, Neuromuscular re-education, Balance training, Gait training, Patient/Family education, Joint manipulation, Joint mobilization, Stair training, Orthotic/Fit training, DME instructions, Aquatic Therapy, Dry Needling, Electrical stimulation, Spinal manipulation, Spinal mobilization, Cryotherapy, Moist heat, Compression bandaging, scar mobilization, Splintting, Taping, Traction, Ultrasound, Ionotophoresis 4mg /ml Dexamethasone, and Manual therapy   PLAN FOR NEXT SESSION: Continue wound care.  Follow up with MD regarding further studies/options.  Watch for infection, request more antibiotic if needed.    2:29 PM, 01/14/23 Teena Irani, PTA/CLT [place] Ph: [phone]

## 2023-01-15 NOTE — Addendum Note (Signed)
Addended by: Mearl Latin on: 01/15/2023 09:11 AM   Modules accepted: Orders

## 2023-01-17 ENCOUNTER — Encounter: Payer: Self-pay | Admitting: Nurse Practitioner

## 2023-01-17 ENCOUNTER — Ambulatory Visit (HOSPITAL_COMMUNITY): Payer: Medicare HMO | Admitting: Physical Therapy

## 2023-01-17 DIAGNOSIS — L97519 Non-pressure chronic ulcer of other part of right foot with unspecified severity: Secondary | ICD-10-CM | POA: Diagnosis not present

## 2023-01-17 DIAGNOSIS — R2689 Other abnormalities of gait and mobility: Secondary | ICD-10-CM

## 2023-01-17 DIAGNOSIS — E11621 Type 2 diabetes mellitus with foot ulcer: Secondary | ICD-10-CM

## 2023-01-17 NOTE — Progress Notes (Signed)
   01/17/23 0001  Subjective Assessment  Subjective PT states that she is taking antibiotics  Patient and Family Stated Goals wound to heal  Date of Onset 07/05/22  Prior Treatments cream  Pain Assessment  Pain Score 0  Evaluation and Treatment  Evaluation and Treatment Procedures Explained to Patient/Family Yes  Evaluation and Treatment Procedures agreed to  Wound / Incision (Open or Dehisced) 10/04/22 Venous stasis ulcer Pretibial Distal;Right medial ankle wound superior  Date First Assessed/Time First Assessed: 10/04/22 1345   Wound Type: Venous stasis ulcer  Location: Pretibial  Location Orientation: Distal;Right  Wound Description (Comments): medial ankle wound superior  Present on Admission: Yes  Dressing Type Gauze (Comment);Compression wrap  Dressing Changed Changed  Dressing Status Old drainage  Dressing Change Frequency PRN  Site / Wound Assessment Red  Peri-wound Assessment Edema;Erythema (blanchable)  Drainage Amount Scant  Drainage Description No odor  Treatment Cleansed;Other (Comment) (manual to decrease swelling)  Selective Debridement (non-excisional)  Selective Debridement (non-excisional) - Location dry skin able to be removed via wash cloth and 4x4 no sharp debridement needed.  Wound Therapy - Assess/Plan/Recommendations  Wound Therapy - Clinical Statement see below  Wound Therapy - Functional Problem List bathing, dressing  Factors Delaying/Impairing Wound Healing Altered sensation;Diabetes Mellitus;Vascular compromise;Multiple medical problems  Hydrotherapy Plan Debridement;Dressing change;Patient/family education  Wound Therapy - Frequency 2X / week  Wound Therapy - Current Recommendations PT  Wound Therapy  Dressing  lotion used with manual followed by 2 xeroform, 4x4, kerlix, ab pad to form LE followed by coban.  Department out of 3" coban therefore had to use 2" coban

## 2023-01-17 NOTE — Therapy (Addendum)
OUTPATIENT PHYSICAL THERAPY Wound Treatment   Patient Name: Krista Payne MRN: [id number] DOB:[birth year], 82 y.o., female Today's Date: 01/14/2023   PCP: Celene Squibb, MD REFERRING PROVIDER: Celene Squibb, MD  END OF SESSION:   PT End of Session - 01/14/23 1403     Visit Number 19    Number of Visits 28    Date for PT Re-Evaluation 02/11/23    Authorization Type Primary: Aetna Medicare Secondary: Tricare (no auth, no VL)    Progress Note Due on Visit 29    PT Start Time 1305    PT Stop Time 1345    PT Time Calculation (min) 40 min    Activity Tolerance Patient tolerated treatment well    Behavior During Therapy WFL for tasks assessed/performed                       Past Medical History:  Diagnosis Date   Breast cancer ([place])    R   Chronic kidney disease    Diabetes mellitus without complication ([place])    Hyperlipidemia    Hypertension    Macular degeneration    Osteoarthritis    Past Surgical History:  Procedure Laterality Date   CATARACT EXTRACTION W/PHACO Right 02/07/2015   Procedure: CATARACT EXTRACTION PHACO AND INTRAOCULAR LENS PLACEMENT ([place]);  Surgeon: Tonny Branch, MD;  Location: [facility];  Service: Ophthalmology;  Laterality: Right;  CDE:12.72   CATARACT EXTRACTION W/PHACO Left 02/24/2015   Procedure: CATARACT EXTRACTION PHACO AND INTRAOCULAR LENS PLACEMENT LEFT EYE;  Surgeon: Tonny Branch, MD;  Location: [facility];  Service: Ophthalmology;  Laterality: Left;  CDE 11.69   COLONOSCOPY     Patient Active Problem List   Diagnosis Date Noted   Unspecified Escherichia coli (E. coli) as the cause of diseases classified elsewhere 02/23/2022   Sepsis ([place]) 02/22/2022   Acute kidney failure ([place]) 02/22/2022   Fall at home, initial encounter 02/22/2022   Personal history of COVID-19 02/02/2022   Pressure injury of skin 11/15/2021   Depression, unspecified 11/15/2021   Hypertensive heart and chronic kidney disease with heart failure and stage 1 through stage 4  chronic kidney disease, or unspecified chronic kidney disease ([place]) 11/15/2021   Insomnia 11/15/2021   Muscle weakness (generalized) 11/15/2021   Other abnormalities of gait and mobility 11/15/2021   Other forms of acute ischemic heart disease 11/15/2021   Traumatic ischemia of muscle ([place]) [id number]   Diastolic heart failure ([place]) 11/15/2021   Diabetic foot infection ([place]) [id number]   Acute metabolic encephalopathy [id number]   Chronic kidney disease, stage 3b ([place]) 11/14/2021   Type 2 diabetes with nephropathy ([place]) 11/14/2021   Elevated troponin 11/13/2021   Lactic acidosis 11/13/2021   COVID-19 virus infection 11/13/2021   Hyperglycemia due to diabetes mellitus ([place]) 11/13/2021   Ankle wound, right, initial encounter 11/13/2021   Altered mental status 11/13/2021   Dehydration 11/13/2021   Long term (current) use of oral hypoglycemic drugs 10/29/2021   Unspecified macular degeneration 10/29/2021   Chronic kidney disease 05/17/2021   Personal history of malignant neoplasm of breast 10/29/2020   Malignant neoplasm of unspecified site of right female breast ([place]) 07/16/2019   URI (upper respiratory infection) 11/20/2011   Other seasonal allergic rhinitis 11/23/2010   Urinary tract infection without hematuria 11/23/2010   RENAL INSUFFICIENCY 10/26/2010   OSTEOARTHRITIS 10/18/2010   Osteoarthritis 10/18/2010   Vitamin D deficiency 08/10/2009   Hyperlipidemia 08/10/2009   Body mass index (BMI) 39.0-39.9, adult 08/10/2009  EDEMA 01/20/2008   Anemia in chronic kidney disease 06/19/2007   Hypertension 06/02/2007    ONSET DATE: 07/05/22  REFERRING DIAG: ulcer of right foot due to type 2 diabetes mellitus  THERAPY DIAG:  Ulcer of right foot due to type 2 diabetes mellitus ([place])  Other abnormalities of gait and mobility  Rationale for Evaluation and Treatment: Rehabilitation   01/17/23 0001  Subjective Assessment  Subjective PT states that she is taking antibiotics  Patient  and Family Stated Goals wound to heal  Date of Onset 07/05/22  Prior Treatments cream  Pain Assessment  Pain Score 0  Evaluation and Treatment  Evaluation and Treatment Procedures Explained to Patient/Family Yes  Evaluation and Treatment Procedures agreed to  Wound / Incision (Open or Dehisced) 10/04/22 Venous stasis ulcer Pretibial Distal;Right medial ankle wound superior  Date First Assessed/Time First Assessed: 10/04/22 1345   Wound Type: Venous stasis ulcer  Location: Pretibial  Location Orientation: Distal;Right  Wound Description (Comments): medial ankle wound superior  Present on Admission: Yes  Dressing Type Gauze (Comment);Compression wrap  Dressing Changed Changed  Dressing Status Old drainage  Dressing Change Frequency PRN  Site / Wound Assessment Red  Peri-wound Assessment Edema;Erythema (blanchable)  Drainage Amount Scant  Drainage Description No odor  Treatment Cleansed;Other (Comment) (manual to decrease swelling)  Selective Debridement (non-excisional)  Selective Debridement (non-excisional) - Location dry skin able to be removed via wash cloth and 4x4 no sharp debridement needed.  Wound Therapy - Assess/Plan/Recommendations  Wound Therapy - Clinical Statement see below  Wound Therapy - Functional Problem List bathing, dressing  Factors Delaying/Impairing Wound Healing Altered sensation;Diabetes Mellitus;Vascular compromise;Multiple medical problems  Hydrotherapy Plan Debridement;Dressing change;Patient/family education  Wound Therapy - Frequency 2X / week  Wound Therapy - Current Recommendations PT  Wound Therapy  Dressing  lotion used with manual followed by 2 xeroform, 4x4, kerlix, ab pad to form LE followed by coban.  Department out of 3" coban therefore had to use 2" coban         PATIENT EDUCATION: Education details: 01/01/23:  Contact MD and return due to wounds are not healing. 11/21/22, POC, changing dressing if soiled; EVAL Patient educated on exam  findings, POC, scope of PT, HEP, and keeping dressing on unless soiled or panful. Person educated: Patient Education method: Explanation, Demonstration, and Handouts Education comprehension: verbalized understanding, returned demonstration, verbal cues required, and tactile cues required   HOME EXERCISE PROGRAM: N/a   GOALS: Goals reviewed with patient? Yes  SHORT TERM GOALS: Target date: 10/25/2022    Wound to be free from slough to demonstrate healing.  Baseline: Goal status: IN PROGRESS   LONG TERM GOALS: Target date: 11/15/2022    Patient wound to be healed to reduce risk of infection. Baseline:  Goal status: IN PROGRESS  2.  Patient will state importance of compression garment use and be able to don to reduce risk of further wound development.  Baseline:  Goal status: IN PROGRESS   ASSESSMENT:  CLINICAL IMPRESSION: LE is dry with minimal weeping therefore calcium alginate discontinued and added xeroform.  Dry skin does not require forceps for removal able to remove with 4x4 and wash cloth therefore no debridement charge.  More time was spent on manual to reduce swelling.  Pt dressed with extra padding and foam to ensure even compression.    Pt will continue to benefit from skilled woundcare to ensure healing and prevent infection.   OBJECTIVE IMPAIRMENTS: Abnormal gait, decreased activity tolerance, decreased balance, decreased mobility, difficulty  walking, impaired sensation, and improper body mechanics.   ACTIVITY LIMITATIONS: carrying, lifting, standing, squatting, stairs, transfers, locomotion level, and caring for others  PARTICIPATION LIMITATIONS: meal prep, cleaning, laundry, shopping, community activity, and yard work  PERSONAL FACTORS: 3+ comorbidities: DM, CKD, HLD, HTN  are also affecting patient's functional outcome.   REHAB POTENTIAL: Good  CLINICAL DECISION MAKING: Evolving/moderate complexity  EVALUATION COMPLEXITY: Moderate  PLAN: PT FREQUENCY:  2x/week  PT DURATION: 4 weeks  PLANNED INTERVENTIONS: Therapeutic exercises, Therapeutic activity, Neuromuscular re-education, Balance training, Gait training, Patient/Family education, Joint manipulation, Joint mobilization, Stair training, Orthotic/Fit training, DME instructions, Aquatic Therapy, Dry Needling, Electrical stimulation, Spinal manipulation, Spinal mobilization, Cryotherapy, Moist heat, Compression bandaging, scar mobilization, Splintting, Taping, Traction, Ultrasound, Ionotophoresis 4mg /ml Dexamethasone, and Manual therapy   PLAN FOR NEXT SESSION: Continue wound care.  Follow up with MD regarding further studies/options.  Watch for infection, request more antibiotic if needed.  Rayetta Humphrey, [place]  [phone]

## 2023-01-18 DIAGNOSIS — H353231 Exudative age-related macular degeneration, bilateral, with active choroidal neovascularization: Secondary | ICD-10-CM | POA: Diagnosis not present

## 2023-01-18 DIAGNOSIS — H43813 Vitreous degeneration, bilateral: Secondary | ICD-10-CM | POA: Diagnosis not present

## 2023-01-18 DIAGNOSIS — H35033 Hypertensive retinopathy, bilateral: Secondary | ICD-10-CM | POA: Diagnosis not present

## 2023-01-24 ENCOUNTER — Ambulatory Visit (HOSPITAL_COMMUNITY): Payer: Medicare HMO | Admitting: Physical Therapy

## 2023-01-24 DIAGNOSIS — E11621 Type 2 diabetes mellitus with foot ulcer: Secondary | ICD-10-CM

## 2023-01-24 DIAGNOSIS — L97519 Non-pressure chronic ulcer of other part of right foot with unspecified severity: Secondary | ICD-10-CM | POA: Diagnosis not present

## 2023-01-24 DIAGNOSIS — R2689 Other abnormalities of gait and mobility: Secondary | ICD-10-CM

## 2023-01-24 NOTE — Therapy (Signed)
OUTPATIENT PHYSICAL THERAPY Wound Treatment Progress Note Reporting Period 12/18/2022 to 01/14/2023  See note below for Objective Data and Assessment of Progress/Goals.     Patient Name: Krista Payne MRN: [id number] DOB:1941/06/03, 82 y.o., female Today's Date: 01/14/2023   PCP: Celene Squibb, MD REFERRING PROVIDER: Celene Squibb, MD  END OF SESSION:   PT End of Session - 01/14/23 1403     Visit Number 19    Number of Visits 28    Date for PT Re-Evaluation 02/11/23    Authorization Type Primary: Aetna Medicare Secondary: Tricare (no auth, no VL)    Progress Note Due on Visit 29    PT Start Time 1305    PT Stop Time 1345    PT Time Calculation (min) 40 min    Activity Tolerance Patient tolerated treatment well    Behavior During Therapy WFL for tasks assessed/performed                       Past Medical History:  Diagnosis Date   Breast cancer ([place])    R   Chronic kidney disease    Diabetes mellitus without complication ([place])    Hyperlipidemia    Hypertension    Macular degeneration    Osteoarthritis    Past Surgical History:  Procedure Laterality Date   CATARACT EXTRACTION W/PHACO Right 02/07/2015   Procedure: CATARACT EXTRACTION PHACO AND INTRAOCULAR LENS PLACEMENT ([place]);  Surgeon: Tonny Branch, MD;  Location: [facility];  Service: Ophthalmology;  Laterality: Right;  CDE:12.72   CATARACT EXTRACTION W/PHACO Left 02/24/2015   Procedure: CATARACT EXTRACTION PHACO AND INTRAOCULAR LENS PLACEMENT LEFT EYE;  Surgeon: Tonny Branch, MD;  Location: [facility];  Service: Ophthalmology;  Laterality: Left;  CDE 11.69   COLONOSCOPY     Patient Active Problem List   Diagnosis Date Noted   Unspecified Escherichia coli (E. coli) as the cause of diseases classified elsewhere 02/23/2022   Sepsis ([place]) 02/22/2022   Acute kidney failure ([place]) 02/22/2022   Fall at home, initial encounter 02/22/2022   Personal history of COVID-19 02/02/2022   Pressure injury of skin 11/15/2021    Depression, unspecified 11/15/2021   Hypertensive heart and chronic kidney disease with heart failure and stage 1 through stage 4 chronic kidney disease, or unspecified chronic kidney disease ([place]) 11/15/2021   Insomnia 11/15/2021   Muscle weakness (generalized) 11/15/2021   Other abnormalities of gait and mobility 11/15/2021   Other forms of acute ischemic heart disease 11/15/2021   Traumatic ischemia of muscle ([place]) [id number]   Diastolic heart failure ([place]) 11/15/2021   Diabetic foot infection ([place]) [id number]   Acute metabolic encephalopathy [id number]   Chronic kidney disease, stage 3b ([place]) 11/14/2021   Type 2 diabetes with nephropathy ([place]) 11/14/2021   Elevated troponin 11/13/2021   Lactic acidosis 11/13/2021   COVID-19 virus infection 11/13/2021   Hyperglycemia due to diabetes mellitus ([place]) 11/13/2021   Ankle wound, right, initial encounter 11/13/2021   Altered mental status 11/13/2021   Dehydration 11/13/2021   Long term (current) use of oral hypoglycemic drugs 10/29/2021   Unspecified macular degeneration 10/29/2021   Chronic kidney disease 05/17/2021   Personal history of malignant neoplasm of breast 10/29/2020   Malignant neoplasm of unspecified site of right female breast ([place]) 07/16/2019   URI (upper respiratory infection) 11/20/2011   Other seasonal allergic rhinitis 11/23/2010   Urinary tract infection without hematuria 11/23/2010   RENAL INSUFFICIENCY 10/26/2010   OSTEOARTHRITIS 10/18/2010   Osteoarthritis 10/18/2010  Vitamin D deficiency 08/10/2009   Hyperlipidemia 08/10/2009   Body mass index (BMI) 39.0-39.9, adult 08/10/2009   EDEMA 01/20/2008   Anemia in chronic kidney disease 06/19/2007   Hypertension 06/02/2007    ONSET DATE: 07/05/22  REFERRING DIAG: ulcer of right foot due to type 2 diabetes mellitus  THERAPY DIAG:  Ulcer of right foot due to type 2 diabetes mellitus ([place])  Other abnormalities of gait and mobility  Rationale for Evaluation and  Treatment: Rehabilitation   01/24/23 0001  Subjective Assessment  Subjective PT states that her antibiotics runs out today  Patient and Family Stated Goals wound to heal  Date of Onset 07/05/22  Prior Treatments cream  Pain Assessment  Pain Score 0  Evaluation and Treatment  Evaluation and Treatment Procedures Explained to Patient/Family Yes  Evaluation and Treatment Procedures agreed to  Wound / Incision (Open or Dehisced) 10/04/22 Venous stasis ulcer Pretibial Distal;Right medial ankle wound superior  Date First Assessed/Time First Assessed: 10/04/22 1345   Wound Type: Venous stasis ulcer  Location: Pretibial  Location Orientation: Distal;Right  Wound Description (Comments): medial ankle wound superior  Present on Admission: Yes  Dressing Type Gauze (Comment);Compression wrap  Dressing Status Old drainage  Dressing Change Frequency PRN  Site / Wound Assessment Red  Peri-wound Assessment Edema;Erythema (blanchable)  Drainage Amount Moderate  Drainage Description No odor  Treatment Cleansed;Debridement (Selective) (manual retrograde techniques to decrease edema.)  Selective Debridement (non-excisional)  Selective Debridement (non-excisional) - Location dry skin able to be removed via wash cloth and 4x4 no sharp debridement needed.  Wound Therapy - Assess/Plan/Recommendations  Wound Therapy - Clinical Statement see below  Wound Therapy - Functional Problem List bathing, dressing  Factors Delaying/Impairing Wound Healing Altered sensation;Diabetes Mellitus;Vascular compromise;Multiple medical problems  Hydrotherapy Plan Debridement;Dressing change;Patient/family education  Wound Therapy - Frequency 2X / week  Wound Therapy - Current Recommendations PT  Wound Plan see below  Wound Therapy  Dressing  lotion used with manual followed by 2 xeroform, 4x4, kerlix, cotton, sockinette,and  ab pad to form LE followed by coban.     PATIENT EDUCATION: Education details: 01/01/23:  Contact MD  and return due to wounds are not healing. 11/21/22, POC, changing dressing if soiled; EVAL Patient educated on exam findings, POC, scope of PT, HEP, and keeping dressing on unless soiled or panful. Person educated: Patient Education method: Explanation, Demonstration, and Handouts Education comprehension: verbalized understanding, returned demonstration, verbal cues required, and tactile cues required   HOME EXERCISE PROGRAM: N/a   GOALS: Goals reviewed with patient? Yes  SHORT TERM GOALS: Target date: 10/25/2022    Wound to be free from slough to demonstrate healing.  Baseline: Goal status: IN PROGRESS   LONG TERM GOALS: Target date: 11/15/2022    Patient wound to be healed to reduce risk of infection. Baseline:  Goal status: IN PROGRESS  2.  Patient will state importance of compression garment use and be able to don to reduce risk of further wound development.  Baseline:  Goal status: IN PROGRESS   ASSESSMENT:  CLINICAL IMPRESSION: Pt has not returned to clinic for a week therefore dressing has slid down and caused some bottle necking.  Manual completed to decrease bottle necking.  Pt shown retro manual techniques and encouraged to complete at home while sitting around.  LE is a combination of dry and weeping.  Dry skin does not require forceps for removal able to remove with 4x4 and wash cloth therefore no debridement charge.  More time was spent on  manual to reduce swelling.  Pt dressed with extra padding and cotton to ensure even compression.    Pt will continue to benefit from skilled woundcare to ensure healing and prevent infection.   OBJECTIVE IMPAIRMENTS: Abnormal gait, decreased activity tolerance, decreased balance, decreased mobility, difficulty walking, impaired sensation, and improper body mechanics.   ACTIVITY LIMITATIONS: carrying, lifting, standing, squatting, stairs, transfers, locomotion level, and caring for others  PARTICIPATION LIMITATIONS: meal prep,  cleaning, laundry, shopping, community activity, and yard work  PERSONAL FACTORS: 3+ comorbidities: DM, CKD, HLD, HTN  are also affecting patient's functional outcome.   REHAB POTENTIAL: Good  CLINICAL DECISION MAKING: Evolving/moderate complexity  EVALUATION COMPLEXITY: Moderate  PLAN: PT FREQUENCY: 2x/week  PT DURATION: 4 weeks  PLANNED INTERVENTIONS: Therapeutic exercises, Therapeutic activity, Neuromuscular re-education, Balance training, Gait training, Patient/Family education, Joint manipulation, Joint mobilization, Stair training, Orthotic/Fit training, DME instructions, Aquatic Therapy, Dry Needling, Electrical stimulation, Spinal manipulation, Spinal mobilization, Cryotherapy, Moist heat, Compression bandaging, scar mobilization, Splintting, Taping, Traction, Ultrasound, Ionotophoresis 4mg /ml Dexamethasone, and Manual therapy   PLAN FOR NEXT SESSION: Continue wound care.  Follow up with MD regarding further studies/options.  Watch for infection, request more antibiotic if needed.  Rayetta Humphrey, [place] [phone]  [phone]

## 2023-02-04 ENCOUNTER — Ambulatory Visit (HOSPITAL_COMMUNITY): Payer: Medicare HMO | Attending: Internal Medicine | Admitting: Physical Therapy

## 2023-02-04 DIAGNOSIS — R2689 Other abnormalities of gait and mobility: Secondary | ICD-10-CM | POA: Diagnosis not present

## 2023-02-04 DIAGNOSIS — L97519 Non-pressure chronic ulcer of other part of right foot with unspecified severity: Secondary | ICD-10-CM | POA: Insufficient documentation

## 2023-02-04 DIAGNOSIS — E11621 Type 2 diabetes mellitus with foot ulcer: Secondary | ICD-10-CM | POA: Diagnosis not present

## 2023-02-04 NOTE — Therapy (Signed)
OUTPATIENT PHYSICAL THERAPY Wound Treatment   Patient Name: Krista Payne MRN: [id number] DOB:September 05, 1941, 82 y.o., female Today's Date: 02/04/2023   PCP: Benita Stabile, MD REFERRING PROVIDER: Benita Stabile, MD  END OF SESSION:   PT End of Session - 02/04/23 0901     Visit Number 22    Number of Visits 28    Date for PT Re-Evaluation 02/28/23    Authorization Type Primary: Aetna Medicare Secondary: Tricare (no auth, no VL)    Progress Note Due on Visit 29    PT Start Time 0820    PT Stop Time 0855    PT Time Calculation (min) 35 min    Activity Tolerance Patient tolerated treatment well    Behavior During Therapy WFL for tasks assessed/performed                       Past Medical History:  Diagnosis Date   Breast cancer ([facility])    R   Chronic kidney disease    Diabetes mellitus without complication ([facility])    Hyperlipidemia    Hypertension    Macular degeneration    Osteoarthritis    Past Surgical History:  Procedure Laterality Date   CATARACT EXTRACTION W/PHACO Right 02/07/2015   Procedure: CATARACT EXTRACTION PHACO AND INTRAOCULAR LENS PLACEMENT ([facility]);  Surgeon: Gemma Payor, MD;  Location: [facility];  Service: Ophthalmology;  Laterality: Right;  CDE:12.72   CATARACT EXTRACTION W/PHACO Left 02/24/2015   Procedure: CATARACT EXTRACTION PHACO AND INTRAOCULAR LENS PLACEMENT LEFT EYE;  Surgeon: Gemma Payor, MD;  Location: [facility];  Service: Ophthalmology;  Laterality: Left;  CDE 11.69   COLONOSCOPY     Patient Active Problem List   Diagnosis Date Noted   Unspecified Escherichia coli (E. coli) as the cause of diseases classified elsewhere 02/23/2022   Sepsis 02/22/2022   Acute kidney failure 02/22/2022   Fall at home, initial encounter 02/22/2022   Personal history of COVID-19 02/02/2022   Pressure injury of skin 11/15/2021   Depression, unspecified 11/15/2021   Hypertensive heart and chronic kidney disease with heart failure and stage 1 through stage 4 chronic kidney  disease, or unspecified chronic kidney disease 11/15/2021   Insomnia 11/15/2021   Muscle weakness (generalized) 11/15/2021   Other abnormalities of gait and mobility 11/15/2021   Other forms of acute ischemic heart disease 11/15/2021   Traumatic ischemia of muscle 11/15/2021   Diastolic heart failure 11/15/2021   Diabetic foot infection 11/14/2021   Acute metabolic encephalopathy 11/14/2021   Chronic kidney disease, stage 3b 11/14/2021   Type 2 diabetes with nephropathy 11/14/2021   Elevated troponin 11/13/2021   Lactic acidosis 11/13/2021   COVID-19 virus infection 11/13/2021   Hyperglycemia due to diabetes mellitus 11/13/2021   Ankle wound, right, initial encounter 11/13/2021   Altered mental status 11/13/2021   Dehydration 11/13/2021   Long term (current) use of oral hypoglycemic drugs 10/29/2021   Unspecified macular degeneration 10/29/2021   Chronic kidney disease 05/17/2021   Personal history of malignant neoplasm of breast 10/29/2020   Malignant neoplasm of unspecified site of right female breast 07/16/2019   URI (upper respiratory infection) 11/20/2011   Other seasonal allergic rhinitis 11/23/2010   Urinary tract infection without hematuria 11/23/2010   RENAL INSUFFICIENCY 10/26/2010   OSTEOARTHRITIS 10/18/2010   Osteoarthritis 10/18/2010   Vitamin D deficiency 08/10/2009   Hyperlipidemia 08/10/2009   Body mass index (BMI) 39.0-39.9, adult 08/10/2009   EDEMA 01/20/2008   Anemia in chronic kidney disease  06/19/2007   Hypertension 06/02/2007    ONSET DATE: 07/05/22  REFERRING DIAG: ulcer of right foot due to type 2 diabetes mellitus  THERAPY DIAG:  Ulcer of right foot due to type 2 diabetes mellitus  Other abnormalities of gait and mobility  Rationale for Evaluation and Treatment: Rehabilitation   01/24/23 0001  Subjective Assessment  Subjective PT states that her antibiotics runs out today  Patient and Family Stated Goals wound to heal  Date of Onset 07/05/22   Prior Treatments cream  Pain Assessment  Pain Score 0  Evaluation and Treatment  Evaluation and Treatment Procedures Explained to Patient/Family Yes  Evaluation and Treatment Procedures agreed to  Wound / Incision (Open or Dehisced) 10/04/22 Venous stasis ulcer Pretibial Distal;Right medial ankle wound superior  Date First Assessed/Time First Assessed: 10/04/22 1345   Wound Type: Venous stasis ulcer  Location: Pretibial  Location Orientation: Distal;Right  Wound Description (Comments): medial ankle wound superior  Present on Admission: Yes  Dressing Type Gauze (Comment);Compression wrap  Dressing Status Old drainage  Dressing Change Frequency PRN  Site / Wound Assessment Red  Peri-wound Assessment Edema;Erythema (blanchable)  Drainage Amount Moderate  Drainage Description No odor  Treatment Cleansed;Debridement (Selective) (manual retrograde techniques to decrease edema.)  Selective Debridement (non-excisional)  Selective Debridement (non-excisional) - Location dry skin able to be removed via wash cloth and 4x4 no sharp debridement needed.  Wound Therapy - Assess/Plan/Recommendations  Wound Therapy - Clinical Statement see below  Wound Therapy - Functional Problem List bathing, dressing  Factors Delaying/Impairing Wound Healing Altered sensation;Diabetes Mellitus;Vascular compromise;Multiple medical problems  Hydrotherapy Plan Debridement;Dressing change;Patient/family education  Wound Therapy - Frequency 2X / week  Wound Therapy - Current Recommendations PT  Wound Plan see below  Wound Therapy  Dressing  lotion used with manual followed by 2 xeroform, 4x4, kerlix, cotton, sockinette,and  ab pad to form LE followed by coban.     PATIENT EDUCATION: Education details: 01/01/23:  Contact MD and return due to wounds are not healing. 11/21/22, POC, changing dressing if soiled; EVAL Patient educated on exam findings, POC, scope of PT, HEP, and keeping dressing on unless soiled or  panful. Person educated: Patient Education method: Explanation, Demonstration, and Handouts Education comprehension: verbalized understanding, returned demonstration, verbal cues required, and tactile cues required   HOME EXERCISE PROGRAM: N/a   GOALS: Goals reviewed with patient? Yes  SHORT TERM GOALS: Target date: 10/25/2022    Wound to be free from slough to demonstrate healing.  Baseline: Goal status: IN PROGRESS   LONG TERM GOALS: Target date: 11/15/2022    Patient wound to be healed to reduce risk of infection. Baseline:  Goal status: IN PROGRESS  2.  Patient will state importance of compression garment use and be able to don to reduce risk of further wound development.  Baseline:  Goal status: IN PROGRESS   ASSESSMENT:  CLINICAL IMPRESSION: Noted bottlenecking with dressing and drainage showing through bandaging.  Presents with increased weeping with combination of scaling skin.  Drainage with foul/sweet odor.  Pt has been coming for 4 months for wound care without notable progress in Rt LE condition.   Suggested to patient she return to MD as the treatments we have tried to date have not produced lasting improvements and suspect more systemic issues may be limiting progress.  Discussed with primary therapist and in agreement of return to MD.   Debrided away dry skin and cleansed well with sterile gauze prior to application of vaseline and compression dressing.  Pt dressed with extra padding to ensure even compression.   Suggested pt wear looser pants as these are also pushing on bandaging.  PT to follow up with MD and notify clinic of outcome.   OBJECTIVE IMPAIRMENTS: Abnormal gait, decreased activity tolerance, decreased balance, decreased mobility, difficulty walking, impaired sensation, and improper body mechanics.   ACTIVITY LIMITATIONS: carrying, lifting, standing, squatting, stairs, transfers, locomotion level, and caring for others  PARTICIPATION LIMITATIONS:  meal prep, cleaning, laundry, shopping, community activity, and yard work  PERSONAL FACTORS: 3+ comorbidities: DM, CKD, HLD, HTN  are also affecting patient's functional outcome.   REHAB POTENTIAL: Good  CLINICAL DECISION MAKING: Evolving/moderate complexity  EVALUATION COMPLEXITY: Moderate  PLAN: PT FREQUENCY: 2x/week  PT DURATION: 4 weeks  PLANNED INTERVENTIONS: Therapeutic exercises, Therapeutic activity, Neuromuscular re-education, Balance training, Gait training, Patient/Family education, Joint manipulation, Joint mobilization, Stair training, Orthotic/Fit training, DME instructions, Aquatic Therapy, Dry Needling, Electrical stimulation, Spinal manipulation, Spinal mobilization, Cryotherapy, Moist heat, Compression bandaging, scar mobilization, Splintting, Taping, Traction, Ultrasound, Ionotophoresis 4mg /ml Dexamethasone, and Manual therapy   PLAN FOR NEXT SESSION: Continue wound care.  Follow up with MD regarding further studies/options.  Watch for infection, request more antibiotic if needed.   Krista NidaAmy Payne Jamarie [place], PTA/CLT [facility] Ph: [phone]   Krista Payne, Krista Payne, PTA 02/04/2023, 10:16 AM

## 2023-02-07 ENCOUNTER — Telehealth (HOSPITAL_COMMUNITY): Payer: Self-pay | Admitting: Physical Therapy

## 2023-02-07 ENCOUNTER — Ambulatory Visit (HOSPITAL_COMMUNITY): Payer: Medicare HMO | Admitting: Physical Therapy

## 2023-02-07 NOTE — Telephone Encounter (Signed)
Pt still with appt scheduled and did not show. Unsure what was decided following instructions to return to MD concerning weeping LE that was not improving with woundcare.  Left VM for patient and/or Dois Davenport to call back and let us know.    Lurena Nida, PTA/CLT [facility] Ph: [phone]

## 2023-02-08 DIAGNOSIS — E1165 Type 2 diabetes mellitus with hyperglycemia: Secondary | ICD-10-CM | POA: Diagnosis not present

## 2023-02-11 ENCOUNTER — Encounter: Payer: Self-pay | Admitting: Nurse Practitioner

## 2023-02-12 ENCOUNTER — Ambulatory Visit (HOSPITAL_COMMUNITY): Payer: Medicare HMO | Admitting: Physical Therapy

## 2023-02-12 DIAGNOSIS — R2689 Other abnormalities of gait and mobility: Secondary | ICD-10-CM | POA: Diagnosis not present

## 2023-02-12 DIAGNOSIS — E11621 Type 2 diabetes mellitus with foot ulcer: Secondary | ICD-10-CM | POA: Diagnosis not present

## 2023-02-12 DIAGNOSIS — L97519 Non-pressure chronic ulcer of other part of right foot with unspecified severity: Secondary | ICD-10-CM | POA: Diagnosis not present

## 2023-02-12 NOTE — Therapy (Signed)
OUTPATIENT PHYSICAL THERAPY Wound Treatment   Patient Name: Krista Payne MRN: [id number] DOB:September 05, 1941, 82 y.o., female Today's Date: 02/04/2023   PCP: Benita Stabile, MD REFERRING PROVIDER: Benita Stabile, MD  END OF SESSION:   PT End of Session - 02/04/23 0901     Visit Number 22    Number of Visits 28    Date for PT Re-Evaluation 02/28/23    Authorization Type Primary: [employer] Medicare Secondary: [employer] (no auth, no VL)    Progress Note Due on Visit 29    PT Start Time 0820    PT Stop Time 0855    PT Time Calculation (min) 35 min    Activity Tolerance Patient tolerated treatment well    Behavior During Therapy WFL for tasks assessed/performed                       Past Medical History:  Diagnosis Date   Breast cancer (HCC)    R   Chronic kidney disease    Diabetes mellitus without complication (HCC)    Hyperlipidemia    Hypertension    Macular degeneration    Osteoarthritis    Past Surgical History:  Procedure Laterality Date   CATARACT EXTRACTION W/PHACO Right 02/07/2015   Procedure: CATARACT EXTRACTION PHACO AND INTRAOCULAR LENS PLACEMENT (IOC);  Surgeon: Gemma Payor, MD;  Location: [facility];  Service: Ophthalmology;  Laterality: Right;  CDE:12.72   CATARACT EXTRACTION W/PHACO Left 02/24/2015   Procedure: CATARACT EXTRACTION PHACO AND INTRAOCULAR LENS PLACEMENT LEFT EYE;  Surgeon: Gemma Payor, MD;  Location: [facility];  Service: Ophthalmology;  Laterality: Left;  CDE 11.69   COLONOSCOPY     Patient Active Problem List   Diagnosis Date Noted   Unspecified Escherichia coli (E. coli) as the cause of diseases classified elsewhere 02/23/2022   Sepsis 02/22/2022   Acute kidney failure 02/22/2022   Fall at home, initial encounter 02/22/2022   Personal history of COVID-19 02/02/2022   Pressure injury of skin 11/15/2021   Depression, unspecified 11/15/2021   Hypertensive heart and chronic kidney disease with heart failure and stage 1 through stage 4 chronic kidney  disease, or unspecified chronic kidney disease 11/15/2021   Insomnia 11/15/2021   Muscle weakness (generalized) 11/15/2021   Other abnormalities of gait and mobility 11/15/2021   Other forms of acute ischemic heart disease 11/15/2021   Traumatic ischemia of muscle 11/15/2021   Diastolic heart failure 11/15/2021   Diabetic foot infection 11/14/2021   Acute metabolic encephalopathy 11/14/2021   Chronic kidney disease, stage 3b 11/14/2021   Type 2 diabetes with nephropathy 11/14/2021   Elevated troponin 11/13/2021   Lactic acidosis 11/13/2021   COVID-19 virus infection 11/13/2021   Hyperglycemia due to diabetes mellitus 11/13/2021   Ankle wound, right, initial encounter 11/13/2021   Altered mental status 11/13/2021   Dehydration 11/13/2021   Long term (current) use of oral hypoglycemic drugs 10/29/2021   Unspecified macular degeneration 10/29/2021   Chronic kidney disease 05/17/2021   Personal history of malignant neoplasm of breast 10/29/2020   Malignant neoplasm of unspecified site of right female breast 07/16/2019   URI (upper respiratory infection) 11/20/2011   Other seasonal allergic rhinitis 11/23/2010   Urinary tract infection without hematuria 11/23/2010   RENAL INSUFFICIENCY 10/26/2010   OSTEOARTHRITIS 10/18/2010   Osteoarthritis 10/18/2010   Vitamin D deficiency 08/10/2009   Hyperlipidemia 08/10/2009   Body mass index (BMI) 39.0-39.9, adult 08/10/2009   EDEMA 01/20/2008   Anemia in chronic kidney disease  06/19/2007   Hypertension 06/02/2007    ONSET DATE: 07/05/22  REFERRING DIAG: ulcer of right foot due to type 2 diabetes mellitus  THERAPY DIAG:  Ulcer of right foot due to type 2 diabetes mellitus  Other abnormalities of gait and mobility  Rationale for Evaluation and Treatment: Rehabilitation   01/24/23 0001  Subjective Assessment  Subjective PT states that her antibiotics runs out today  Patient and Family Stated Goals wound to heal  Date of Onset 07/05/22   Prior Treatments cream  Pain Assessment  Pain Score 0  Evaluation and Treatment  Evaluation and Treatment Procedures Explained to Patient/Family Yes  Evaluation and Treatment Procedures agreed to  Wound / Incision (Open or Dehisced) 10/04/22 Venous stasis ulcer Pretibial Distal;Right medial ankle wound superior  Date First Assessed/Time First Assessed: 10/04/22 1345   Wound Type: Venous stasis ulcer  Location: Pretibial  Location Orientation: Distal;Right  Wound Description (Comments): medial ankle wound superior  Present on Admission: Yes  Dressing Type Gauze (Comment);Compression wrap  Dressing Status Old drainage  Dressing Change Frequency PRN  Site / Wound Assessment Red  Peri-wound Assessment Edema;Erythema (blanchable)  Drainage Amount Moderate  Drainage Description No odor  Treatment Cleansed;Debridement (Selective) (manual retrograde techniques to decrease edema.)  Selective Debridement (non-excisional)  Selective Debridement (non-excisional) - Location dry skin able to be removed via wash cloth and 4x4 no sharp debridement needed.  Wound Therapy - Assess/Plan/Recommendations  Wound Therapy - Clinical Statement see below  Wound Therapy - Functional Problem List bathing, dressing  Factors Delaying/Impairing Wound Healing Altered sensation;Diabetes Mellitus;Vascular compromise;Multiple medical problems  Hydrotherapy Plan Debridement;Dressing change;Patient/family education  Wound Therapy - Frequency 2X / week  Wound Therapy - Current Recommendations PT  Wound Plan see below  Wound Therapy  Dressing  lotion used with manual followed by 2 xeroform, 4x4, kerlix, cotton, sockinette,and  ab pad to form LE followed by coban.     PATIENT EDUCATION: Education details: 01/01/23:  Contact MD and return due to wounds are not healing. 11/21/22, POC, changing dressing if soiled; EVAL Patient educated on exam findings, POC, scope of PT, HEP, and keeping dressing on unless soiled or  panful. Person educated: Patient Education method: Explanation, Demonstration, and Handouts Education comprehension: verbalized understanding, returned demonstration, verbal cues required, and tactile cues required   HOME EXERCISE PROGRAM: N/a   GOALS: Goals reviewed with patient? Yes  SHORT TERM GOALS: Target date: 10/25/2022    Wound to be free from slough to demonstrate healing.  Baseline: Goal status: IN PROGRESS   LONG TERM GOALS: Target date: 11/15/2022    Patient wound to be healed to reduce risk of infection. Baseline:  Goal status: IN PROGRESS  2.  Patient will state importance of compression garment use and be able to don to reduce risk of further wound development.  Baseline:  Goal status: IN PROGRESS   ASSESSMENT:  CLINICAL IMPRESSION: Pt returns without reasoning for missed appt or verification of message received by our clinic. States she has an appt with Dr Margo Aye next week. Continues bottlenecking with dressing and drainage showing through bandaging.  Still with moderate weeping with combination of scaling skin.  Drainage with foul/sweet odor.   Therapist called Dr Scharlene Gloss office and left VM with nursing regarding lack of improvement.  Cleansed well and debrided scaling skin. Discussed with primary therapist and will try an antifungal when returns next visit.  Did apply 1/2" foam to see if this helps with the bottlenecking. Pt dressed with extra padding to ensure even  compression.  Pt also would benefit from antibiotic.    OBJECTIVE IMPAIRMENTS: Abnormal gait, decreased activity tolerance, decreased balance, decreased mobility, difficulty walking, impaired sensation, and improper body mechanics.   ACTIVITY LIMITATIONS: carrying, lifting, standing, squatting, stairs, transfers, locomotion level, and caring for others  PARTICIPATION LIMITATIONS: meal prep, cleaning, laundry, shopping, community activity, and yard work  PERSONAL FACTORS: 3+ comorbidities: DM, CKD,  HLD, HTN  are also affecting patient's functional outcome.   REHAB POTENTIAL: Good  CLINICAL DECISION MAKING: Evolving/moderate complexity  EVALUATION COMPLEXITY: Moderate  PLAN: PT FREQUENCY: 2x/week  PT DURATION: 4 weeks  PLANNED INTERVENTIONS: Therapeutic exercises, Therapeutic activity, Neuromuscular re-education, Balance training, Gait training, Patient/Family education, Joint manipulation, Joint mobilization, Stair training, Orthotic/Fit training, DME instructions, Aquatic Therapy, Dry Needling, Electrical stimulation, Spinal manipulation, Spinal mobilization, Cryotherapy, Moist heat, Compression bandaging, scar mobilization, Splintting, Taping, Traction, Ultrasound, Ionotophoresis /ml Dexamethasone, and Manual therapy   PLAN FOR NEXT SESSION: Continue wound care.  Follow up with MD regarding further studies/options.  Watch for infection, request more antibiotic if needed. Try antifungal treatment.  Lurena Nida, PTA/CLT [facility] Ph: [phone]   Lurena Nida, PTA 02/04/2023, 10:16 AM

## 2023-02-13 ENCOUNTER — Encounter: Payer: Self-pay | Admitting: Nurse Practitioner

## 2023-02-13 ENCOUNTER — Ambulatory Visit (INDEPENDENT_AMBULATORY_CARE_PROVIDER_SITE_OTHER): Payer: Medicare HMO | Admitting: Nurse Practitioner

## 2023-02-13 VITALS — BP 128/74 | HR 59 | Ht 63.0 in | Wt 204.2 lb

## 2023-02-13 DIAGNOSIS — E1122 Type 2 diabetes mellitus with diabetic chronic kidney disease: Secondary | ICD-10-CM | POA: Diagnosis not present

## 2023-02-13 DIAGNOSIS — N184 Chronic kidney disease, stage 4 (severe): Secondary | ICD-10-CM | POA: Diagnosis not present

## 2023-02-13 LAB — POCT GLYCOSYLATED HEMOGLOBIN (HGB A1C): Hemoglobin A1C: 14.2 % — AB (ref 4.0–5.6)

## 2023-02-13 NOTE — Progress Notes (Signed)
Endocrinology Follow Up Note       02/13/2023, 11:00 AM   Subjective:    Patient ID: Krista Payne, female    DOB: 1941-01-06.  Krista Payne is being seen in follow up after being seen in consultation for management of currently uncontrolled symptomatic diabetes requested by  Benita Stabile, MD.   Past Medical History:  Diagnosis Date   Breast cancer    R   Chronic kidney disease    Diabetes mellitus without complication    Hyperlipidemia    Hypertension    Macular degeneration    Osteoarthritis     Past Surgical History:  Procedure Laterality Date   CATARACT EXTRACTION W/PHACO Right 02/07/2015   Procedure: CATARACT EXTRACTION PHACO AND INTRAOCULAR LENS PLACEMENT (IOC);  Surgeon: Gemma Payor, MD;  Location: [facility];  Service: Ophthalmology;  Laterality: Right;  CDE:12.72   CATARACT EXTRACTION W/PHACO Left 02/24/2015   Procedure: CATARACT EXTRACTION PHACO AND INTRAOCULAR LENS PLACEMENT LEFT EYE;  Surgeon: Gemma Payor, MD;  Location: [facility];  Service: Ophthalmology;  Laterality: Left;  CDE 11.69   COLONOSCOPY      Social History   Socioeconomic History   Marital status: Widowed    Spouse name: Not on file   Number of children: 3   Years of education: 78   Highest education level: 12th grade  Occupational History   Not on file  Tobacco Use   Smoking status: Never    Passive exposure: Never   Smokeless tobacco: Never  Vaping Use   Vaping Use: Never used  Substance and Sexual Activity   Alcohol use: No   Drug use: No   Sexual activity: Not Currently    Partners: Male  Other Topics Concern   Not on file  Social History Narrative   Not on file   Social Determinants of Health   Financial Resource Strain: Low Risk  (07/19/2022)   Overall Financial Resource Strain (CARDIA)    Difficulty of Paying Living Expenses: Not hard at all  Food Insecurity: No Food Insecurity (07/19/2022)   Hunger Vital Sign     Worried About Running Out of Food in the Last Year: Never true    Ran Out of Food in the Last Year: Never true  Transportation Needs: No Transportation Needs (07/19/2022)   PRAPARE - Administrator, Civil Service (Medical): No    Lack of Transportation (Non-Medical): No  Physical Activity: Inactive (07/19/2022)   Exercise Vital Sign    Days of Exercise per Week: 0 days    Minutes of Exercise per Session: 0 min  Stress: No Stress Concern Present (07/19/2022)   [employer] of Occupational Health - Occupational Stress Questionnaire    Feeling of Stress : Not at all  Social Connections: Moderately Integrated (07/19/2022)   Social Connection and Isolation Panel [NHANES]    Frequency of Communication with Friends and Family: More than three times a week    Frequency of Social Gatherings with Friends and Family: More than three times a week    Attends Religious Services: More than 4 times per year    Active Member of Clubs or Organizations: Yes    Attends  Club or Organization Meetings: More than 4 times per year    Marital Status: Widowed    Family History  Problem Relation Age of Onset   Macular degeneration Mother    Diabetes Father    Heart disease Daughter     Outpatient Encounter Medications as of 02/13/2023  Medication Sig   amLODipine (NORVASC) 5 MG tablet Take 0.5 tablets (2.5 mg total) by mouth daily.   anastrozole (ARIMIDEX) 1 MG tablet Take 1 tablet (1 mg total) by mouth daily.   atenolol (TENORMIN) 25 MG tablet Take 25 mg by mouth daily.   buPROPion (WELLBUTRIN XL) 150 MG 24 hr tablet Take 300 mg by mouth daily as needed (feeling moody/depressed).   furosemide (LASIX) 20 MG tablet Take 10 mg by mouth daily as needed for fluid.   insulin glargine (LANTUS) 100 UNIT/ML injection Inject 0.16 mLs (16 Units total) into the skin at bedtime.   Insulin Pen Needle (PEN NEEDLES) 31G X 6 MM MISC Use to inject insulin once daily   mupirocin ointment (BACTROBAN) 2 %  SMARTSIG:sparingly Topical 3 Times Daily   olmesartan (BENICAR) 5 MG tablet Take 5 mg by mouth daily.   simvastatin (ZOCOR) 20 MG tablet Take 1 tablet (20 mg total) by mouth at bedtime.   No facility-administered encounter medications on file as of 02/13/2023.    ALLERGIES: No Known Allergies  VACCINATION STATUS: Immunization History  Administered Date(s) Administered   Influenza Split 11/13/2011   Influenza Whole 07/20/2008, 08/10/2009, 10/18/2010   Influenza-Unspecified 07/29/2013, 08/12/2020   Pneumococcal Polysaccharide-23 07/20/2008   Td 07/29/1998, 08/10/2009    Diabetes She presents for her follow-up diabetic visit. She has type 2 diabetes mellitus. Her disease course has been worsening. There are no hypoglycemic associated symptoms. Associated symptoms include blurred vision, fatigue, polydipsia, polyuria and weight loss. There are no hypoglycemic complications. Symptoms are improving. Diabetic complications include nephropathy and peripheral neuropathy. Risk factors for coronary artery disease include diabetes mellitus, dyslipidemia, family history, hypertension, sedentary lifestyle, post-menopausal and obesity. Current diabetic treatment includes insulin injections. She is compliant with treatment most of the time. Her weight is decreasing steadily. She is following a generally unhealthy diet. When asked about meal planning, she reported none (gets meals on wheels). She has not had a previous visit with a dietitian. She participates in exercise intermittently. Her home blood glucose trend is increasing rapidly. Her breakfast blood glucose range is generally >200 mg/dl. Her lunch blood glucose range is generally >200 mg/dl. Her dinner blood glucose range is generally >200 mg/dl. Her bedtime blood glucose range is generally >200 mg/dl. Her overall blood glucose range is >200 mg/dl. (She presents today with her CGM showing gross hyperglycemia overall.  Her POCT A1c today is 14.2%,  worsening from last visit of 8.4%.  Analysis of her CGM shows TIR 0%, TAR 100%, TBR 0%, with a GMI of 12.8%.  Her daughter reached out on several occasions to report HIGH readings on her CGM device and that she is still eating loads of concentrated sweets.  She now has limited help in the home but still has access to the sweet foods.  She notes she has been drinking mostly water but will have soda on occasion.  She says she thinks she may be overdoing it with her breads and such.) An ACE inhibitor/angiotensin II receptor blocker is being taken. She sees a podiatrist.Eye exam is current.  Hyperlipidemia This is a chronic problem. The current episode started more than 1 year ago. The problem is  controlled. Recent lipid tests were reviewed and are variable. Exacerbating diseases include chronic renal disease, diabetes and obesity. Factors aggravating her hyperlipidemia include beta blockers and fatty foods. Current antihyperlipidemic treatment includes statins. The current treatment provides moderate improvement of lipids. Compliance problems include adherence to diet and adherence to exercise.  Risk factors for coronary artery disease include diabetes mellitus, dyslipidemia, family history, hypertension, obesity, post-menopausal and a sedentary lifestyle.  Hypertension This is a chronic problem. The current episode started more than 1 year ago. The problem has been resolved since onset. The problem is controlled. Associated symptoms include blurred vision. There are no associated agents to hypertension. Risk factors for coronary artery disease include diabetes mellitus, dyslipidemia, family history, obesity and sedentary lifestyle. Past treatments include calcium channel blockers, angiotensin blockers and diuretics. There are no compliance problems.  Hypertensive end-organ damage includes kidney disease. Identifiable causes of hypertension include chronic renal disease.    Review of systems  Constitutional: +  steadily decreasing body weight,  current Body mass index is 36.17 kg/m. , + fatigue, no subjective hyperthermia, no subjective hypothermia Eyes: no blurry vision, no xerophthalmia ENT: no sore throat, no nodules palpated in throat, no dysphagia/odynophagia, no hoarseness Cardiovascular: no chest pain, no shortness of breath, no palpitations, no leg swelling Respiratory: no cough, no shortness of breath Gastrointestinal: no nausea/vomiting/diarrhea Genitourinary:+ polyuria Musculoskeletal: no muscle/joint aches- reports no falls since last visit Skin: no rashes, no hyperemia Neurological: no tremors, no numbness, no tingling, no dizziness Psychiatric: no depression, no anxiety   Objective:     BP 128/74 (BP Location: Left Arm, Patient Position: Sitting, Cuff Size: Large)   Pulse (!) 59   Ht  (1.6 m)   Wt 204 lb 3.2 oz (92.6 kg)   BMI 36.17 kg/m   Wt Readings from Last 3 Encounters:  02/13/23 204 lb 3.2 oz (92.6 kg)  12/12/22 212 lb 3.2 oz (96.3 kg)  12/11/22 211 lb 6.4 oz (95.9 kg)     BP Readings from Last 3 Encounters:  02/13/23 128/74  12/12/22 (!) 209/92  12/11/22 (!) 180/99      Physical Exam- Limited  Constitutional:  Body mass index is 36.17 kg/m. , not in acute distress, normal state of mind Eyes:  EOMI, no exophthalmos Musculoskeletal: no gross deformities, strength intact in all four extremities, no gross restriction of joint movements, reports no falls since last visit Skin:  no rashes, no hyperemia Neurological: no tremor with outstretched hands   Diabetic Foot Exam - Simple   No data filed     CMP ( most recent) CMP     Component Value Date/Time   NA 135 12/04/2022 0945   NA 140 06/22/2022 0000   K 4.9 12/04/2022 0945   CL 103 12/04/2022 0945   CO2 22 12/04/2022 0945   GLUCOSE 193 (H) 12/04/2022 0945   BUN 39 (H) 12/04/2022 0945   BUN 39 (A) 06/22/2022 0000   CREATININE 1.46 (H) 12/04/2022 0945   CALCIUM 9.5 12/04/2022 0945   PROT 7.7  12/04/2022 0945   ALBUMIN 3.6 12/04/2022 0945   AST 16 12/04/2022 0945   ALT 11 12/04/2022 0945   ALKPHOS 79 12/04/2022 0945   BILITOT 0.7 12/04/2022 0945   GFRNONAA 36 (L) 12/04/2022 0945   GFRAA 43 (L) 06/01/2020 1259     Diabetic Labs (most recent): Lab Results  Component Value Date   HGBA1C 14.2 (A) 02/13/2023   HGBA1C 8.4 11/14/2022   HGBA1C 8.9 06/21/2022  Lipid Panel ( most recent) Lipid Panel     Component Value Date/Time   CHOL 183 06/21/2022 0000   TRIG 166 (A) 06/21/2022 0000   HDL 52 06/21/2022 0000   CHOLHDL 3 02/20/2011 1051   VLDL 27.8 02/20/2011 1051   LDLCALC 102 06/21/2022 0000   LDLDIRECT 168.8 10/19/2008 0914      Lab Results  Component Value Date   TSH 1.07 02/20/2011   TSH 1.70 10/18/2010   TSH 1.49 08/10/2009   TSH 1.23 07/20/2008   TSH 1.39 06/19/2007           Assessment & Plan:   1) Type 2 diabetes mellitus with stage 4 chronic kidney disease, without long-term current use of insulin (HCC)  She presents today with her CGM showing gross hyperglycemia overall.  Her POCT A1c today is 14.2%, worsening from last visit of 8.4%.  Analysis of her CGM shows TIR 0%, TAR 100%, TBR 0%, with a GMI of 12.8%.  Her daughter reached out on several occasions to report HIGH readings on her CGM device and that she is still eating loads of concentrated sweets.  She now has limited help in the home but still has access to the sweet foods.  She notes she has been drinking mostly water but will have soda on occasion.  She says she thinks she may be overdoing it with her breads and such.  - Krista Payne has currently uncontrolled symptomatic type 2 DM since 82 years of age.   -Recent labs reviewed.  - I had a long discussion with her about the progressive nature of diabetes and the pathology behind its complications. -her diabetes is complicated by CKD and she remains at a high risk for more acute and chronic complications which include CAD, CVA, CKD,  retinopathy, and neuropathy. These are all discussed in detail with her.  - Nutritional counseling repeated at each appointment due to patients tendency to fall back in to old habits.  - The patient admits there is a room for improvement in their diet and drink choices. -  Suggestion is made for the patient to avoid simple carbohydrates from their diet including Cakes, Sweet Desserts / Pastries, Ice Cream, Soda (diet and regular), Sweet Tea, Candies, Chips, Cookies, Sweet Pastries, Store Bought Juices, Alcohol in Excess of 1-2 drinks a day, Artificial Sweeteners, Coffee Creamer, and "Sugar-free" Products. This will help patient to have stable blood glucose profile and potentially avoid unintended weight gain.   - I encouraged the patient to switch to unprocessed or minimally processed complex starch and increased protein intake (animal or plant source), fruits, and vegetables.   - Patient is advised to stick to a routine mealtimes to eat 3 meals a day and avoid unnecessary snacks (to snack only to correct hypoglycemia).  - I have approached her with the following individualized plan to manage her diabetes and patient agrees:   -Based on her severe hyperglycemia, she will need more insulin.  She is advised to increase her Lantus to 30 units SQ nightly. She will do best to avoid sugary foods including sodas, juices, ice cream, candy, etc.  I really stressed the importance of being strict with her diet as her glucose is dangerously high.  I am hoping to avoid need for meal time insulin as I am not sure she would be able to manage that on her own.  -she is encouraged to continue monitoring glucose at least twice daily, before breakfast and before bed (using her CGM),  and to call the clinic if she has readings less than 70 or above 300 for 3 tests in a row.     - she is warned not to take insulin without proper monitoring per orders. - Adjustment parameters are given to her for hypo and hyperglycemia in  writing.  - her Glipizide was discontinued, risk outweighs benefit for this patient given her CKD and advanced age, this medication increases her risk of severe hypoglycemia. - she is not a candidate for Metformin due to concurrent renal insufficiency.  - she will be considered for incretin therapy as appropriate next visit.  - Specific targets for  A1c; LDL, HDL, and Triglycerides were discussed with the patient.  2) Blood Pressure /Hypertension:  her blood pressure is not controlled to target today but she has NOT yet taken her medications.   she is advised to continue her current medications including Norvasc 5 mg p.o. daily with breakfast, Atenolol 25 mg po daily, Lasix 10 mg po daily, and Benicar 5 mg po daily.  3) Lipids/Hyperlipidemia:    Review of her recent lipid panel from 06/12/21 showed controlled LDL at 92 and elevated triglycerides of 175 .  she is advised to continue Zocor 20 mg daily at bedtime.  Side effects and precautions discussed with her.  4)  Weight/Diet:  her Body mass index is 36.17 kg/m.  -  clearly complicating her diabetes care.   she is a candidate for weight loss. I discussed with her the fact that loss of 5 - 10% of her  current body weight will have the most impact on her diabetes management.  Exercise, and detailed carbohydrates information provided  -  detailed on discharge instructions.  5) Chronic Care/Health Maintenance: -she is on ACEI/ARB and Statin medications and is encouraged to initiate and continue to follow up with Ophthalmology, Dentist, Podiatrist at least yearly or according to recommendations, and advised to stay away from smoking. I have recommended yearly flu vaccine and pneumonia vaccine at least every 5 years; moderate intensity exercise for up to 150 minutes weekly; and sleep for at least 7 hours a day.  - she is advised to maintain close follow up with Benita Stabile, MD for primary care needs, as well as her other providers for optimal and  coordinated care.      I spent  46  minutes in the care of the patient today including review of labs from CMP, Lipids, Thyroid Function, Hematology (current and previous including abstractions from other facilities); face-to-face time discussing  her blood glucose readings/logs, discussing hypoglycemia and hyperglycemia episodes and symptoms, medications doses, her options of short and long term treatment based on the latest standards of care / guidelines;  discussion about incorporating lifestyle medicine;  and documenting the encounter. Risk reduction counseling performed per USPSTF guidelines to reduce obesity and cardiovascular risk factors.     Please refer to Patient Instructions for Blood Glucose Monitoring and Insulin/Medications Dosing Guide"  in media tab for additional information. Please  also refer to " Patient Self Inventory" in the Media  tab for reviewed elements of pertinent patient history.  Julianne Handler participated in the discussions, expressed understanding, and voiced agreement with the above plans.  All questions were answered to her satisfaction. she is encouraged to contact clinic should she have any questions or concerns prior to her return visit.     Follow up plan: - Return in about 4 weeks (around 03/13/2023) for Diabetes F/U, Bring meter and logs.  Ronny Bacon, [facility] [address] [place], [place] [address] Phone: [phone] Fax: [phone]  02/13/2023, 11:00 AM

## 2023-02-14 ENCOUNTER — Ambulatory Visit (HOSPITAL_COMMUNITY): Payer: Medicare HMO | Admitting: Physical Therapy

## 2023-02-14 DIAGNOSIS — R2689 Other abnormalities of gait and mobility: Secondary | ICD-10-CM

## 2023-02-14 DIAGNOSIS — L97519 Non-pressure chronic ulcer of other part of right foot with unspecified severity: Secondary | ICD-10-CM | POA: Diagnosis not present

## 2023-02-14 DIAGNOSIS — E11621 Type 2 diabetes mellitus with foot ulcer: Secondary | ICD-10-CM | POA: Diagnosis not present

## 2023-02-14 NOTE — Therapy (Addendum)
OUTPATIENT PHYSICAL THERAPY Wound Treatment   Patient Name: Krista Payne MRN: [id number] DOB:1941/01/27, 82 y.o., female Today's Date: 02/14/2023   PCP: Benita Stabile, MD REFERRING PROVIDER: Benita Stabile, MD  END OF SESSION:   PT End of Session - 02/14/23 1612     Visit Number 24    Number of Visits 28    Date for PT Re-Evaluation 02/28/23    Authorization Type Primary: [employer] Medicare Secondary: Tricare (no auth, no VL)    Progress Note Due on Visit 29    Time in 1520   Time out 1600   Treatment time 40   Activity Tolerance Patient tolerated treatment well    Behavior During Therapy [facility] for tasks assessed/performed                       Past Medical History:  Diagnosis Date   Breast cancer    R   Chronic kidney disease    Diabetes mellitus without complication    Hyperlipidemia    Hypertension    Macular degeneration    Osteoarthritis    Past Surgical History:  Procedure Laterality Date   CATARACT EXTRACTION W/PHACO Right 02/07/2015   Procedure: CATARACT EXTRACTION PHACO AND INTRAOCULAR LENS PLACEMENT (IOC);  Surgeon: Gemma Payor, MD;  Location: [facility];  Service: Ophthalmology;  Laterality: Right;  CDE:12.72   CATARACT EXTRACTION W/PHACO Left 02/24/2015   Procedure: CATARACT EXTRACTION PHACO AND INTRAOCULAR LENS PLACEMENT LEFT EYE;  Surgeon: Gemma Payor, MD;  Location: [facility];  Service: Ophthalmology;  Laterality: Left;  CDE 11.69   COLONOSCOPY     Patient Active Problem List   Diagnosis Date Noted   Unspecified Escherichia coli (E. coli) as the cause of diseases classified elsewhere 02/23/2022   Sepsis 02/22/2022   Acute kidney failure 02/22/2022   Fall at home, initial encounter 02/22/2022   Personal history of COVID-19 02/02/2022   Pressure injury of skin 11/15/2021   Depression, unspecified 11/15/2021   Hypertensive heart and chronic kidney disease with heart failure and stage 1 through stage 4 chronic kidney disease, or unspecified chronic kidney  disease 11/15/2021   Insomnia 11/15/2021   Muscle weakness (generalized) 11/15/2021   Other abnormalities of gait and mobility 11/15/2021   Other forms of acute ischemic heart disease 11/15/2021   Traumatic ischemia of muscle 11/15/2021   Diastolic heart failure 11/15/2021   Diabetic foot infection 11/14/2021   Acute metabolic encephalopathy 11/14/2021   Chronic kidney disease, stage 3b 11/14/2021   Type 2 diabetes with nephropathy 11/14/2021   Elevated troponin 11/13/2021   Lactic acidosis 11/13/2021   COVID-19 virus infection 11/13/2021   Hyperglycemia due to diabetes mellitus 11/13/2021   Ankle wound, right, initial encounter 11/13/2021   Altered mental status 11/13/2021   Dehydration 11/13/2021   Long term (current) use of oral hypoglycemic drugs 10/29/2021   Unspecified macular degeneration 10/29/2021   Chronic kidney disease 05/17/2021   Personal history of malignant neoplasm of breast 10/29/2020   Malignant neoplasm of unspecified site of right female breast 07/16/2019   URI (upper respiratory infection) 11/20/2011   Other seasonal allergic rhinitis 11/23/2010   Urinary tract infection without hematuria 11/23/2010   RENAL INSUFFICIENCY 10/26/2010   OSTEOARTHRITIS 10/18/2010   Osteoarthritis 10/18/2010   Vitamin D deficiency 08/10/2009   Hyperlipidemia 08/10/2009   Body mass index (BMI) 39.0-39.9, adult 08/10/2009   EDEMA 01/20/2008   Anemia in chronic kidney disease 06/19/2007   Hypertension 06/02/2007    ONSET DATE:  07/05/22  REFERRING DIAG: ulcer of right foot due to type 2 diabetes mellitus  THERAPY DIAG:  Ulcer of right foot due to type 2 diabetes mellitus  Other abnormalities of gait and mobility  Rationale for Evaluation and Treatment: Rehabilitation  Wound Therapy - 02/14/23 1621     Subjective pt reports her leg felt much better with that wrap, didn't even know she had anything on.    Patient and Family Stated Goals wound to heal    Date of Onset  07/05/22    Prior Treatments cream    Evaluation and Treatment Procedures Explained to Patient/Family Yes    Evaluation and Treatment Procedures agreed to    Wound Properties Date First Assessed: 10/04/22 Time First Assessed: 1345 Wound Type: Venous stasis ulcer Location: Pretibial Location Orientation: Distal;Right Wound Description (Comments): medial ankle wound superior Present on Admission: Yes   Dressing Type Gauze (Comment);Compression wrap    Dressing Changed Changed    Dressing Status Old drainage    Dressing Change Frequency PRN    Site / Wound Assessment Red    Peri-wound Assessment Edema;Erythema (blanchable)    Drainage Amount Minimal    Drainage Description Odor - foul    Treatment Cleansed;Debridement (Selective)    Selective Debridement (non-excisional) - Location dry skin able to be removed via wash cloth and 4x4 no sharp debridement needed.    Wound Therapy - Clinical Statement see below    Wound Therapy - Functional Problem List bathing, dressing    Factors Delaying/Impairing Wound Healing Altered sensation;Diabetes Mellitus;Vascular compromise;Multiple medical problems    Hydrotherapy Plan Debridement;Dressing change;Patient/family education    Wound Therapy - Frequency 2X / week    Wound Therapy - Current Recommendations PT    Wound Plan see below    Dressing  dry antifungal powder, 2 ABD pads. lymph netting, 1/2" foam, kerlix, cotton, coban, #5 netting.                PATIENT EDUCATION: Education details: 01/01/23:  Contact MD and return due to wounds are not healing. 11/21/22, POC, changing dressing if soiled; EVAL Patient educated on exam findings, POC, scope of PT, HEP, and keeping dressing on unless soiled or panful. Person educated: Patient Education method: Explanation, Demonstration, and Handouts Education comprehension: verbalized understanding, returned demonstration, verbal cues required, and tactile cues required   HOME EXERCISE  PROGRAM: N/a   GOALS: Goals reviewed with patient? Yes  SHORT TERM GOALS: Target date: 10/25/2022    Wound to be free from slough to demonstrate healing.  Baseline: Goal status: IN PROGRESS   LONG TERM GOALS: Target date: 11/15/2022    Patient wound to be healed to reduce risk of infection. Baseline:  Goal status: IN PROGRESS  2.  Patient will state importance of compression garment use and be able to don to reduce risk of further wound development.  Baseline:  Goal status: IN PROGRESS   ASSESSMENT:  CLINICAL IMPRESSION: Dr. Scharlene Gloss nurse called back 4/17 and discussed concerns regarding smell, limited improvement and possible need for low dose antibiotic maintenance until wound heals.  Overall, LE was much improved today following last visit cleansing/debridement and addition of 1/2" foam.  Reduced drainage today to minimal.  Debrided away scaling and devitalized tissue and cleansed well with antibacterial soap/sterile gauze.  Antifungal powder was applied to LE prior to re bandaging to see if this has any affect on healing irritated tissue in leg.  Continued with 1/2" foam to ensure even compression    OBJECTIVE  IMPAIRMENTS: Abnormal gait, decreased activity tolerance, decreased balance, decreased mobility, difficulty walking, impaired sensation, and improper body mechanics.   ACTIVITY LIMITATIONS: carrying, lifting, standing, squatting, stairs, transfers, locomotion level, and caring for others  PARTICIPATION LIMITATIONS: meal prep, cleaning, laundry, shopping, community activity, and yard work  PERSONAL FACTORS: 3+ comorbidities: DM, CKD, HLD, HTN  are also affecting patient's functional outcome.   REHAB POTENTIAL: Good  CLINICAL DECISION MAKING: Evolving/moderate complexity  EVALUATION COMPLEXITY: Moderate  PLAN: PT FREQUENCY: 2x/week  PT DURATION: 4 weeks  PLANNED INTERVENTIONS: Therapeutic exercises, Therapeutic activity, Neuromuscular re-education, Balance  training, Gait training, Patient/Family education, Joint manipulation, Joint mobilization, Stair training, Orthotic/Fit training, DME instructions, Aquatic Therapy, Dry Needling, Electrical stimulation, Spinal manipulation, Spinal mobilization, Cryotherapy, Moist heat, Compression bandaging, scar mobilization, Splintting, Taping, Traction, Ultrasound, Ionotophoresis 4mg /ml Dexamethasone, and Manual therapy   PLAN FOR NEXT SESSION: Continue wound care.  Follow up with MD after appointment next week; Follow up with effectiveness of antifungal.    Lurena Nida, PTA/CLT [facility] Ph: [phone]   Lurena Nida, PTA 02/14/2023, 4:23 PM

## 2023-02-18 DIAGNOSIS — H35033 Hypertensive retinopathy, bilateral: Secondary | ICD-10-CM | POA: Diagnosis not present

## 2023-02-18 DIAGNOSIS — H43813 Vitreous degeneration, bilateral: Secondary | ICD-10-CM | POA: Diagnosis not present

## 2023-02-18 DIAGNOSIS — H353231 Exudative age-related macular degeneration, bilateral, with active choroidal neovascularization: Secondary | ICD-10-CM | POA: Diagnosis not present

## 2023-02-19 ENCOUNTER — Ambulatory Visit (HOSPITAL_COMMUNITY): Payer: Medicare HMO | Admitting: Physical Therapy

## 2023-02-19 DIAGNOSIS — L97909 Non-pressure chronic ulcer of unspecified part of unspecified lower leg with unspecified severity: Secondary | ICD-10-CM | POA: Diagnosis not present

## 2023-02-19 DIAGNOSIS — E1122 Type 2 diabetes mellitus with diabetic chronic kidney disease: Secondary | ICD-10-CM | POA: Diagnosis not present

## 2023-02-19 DIAGNOSIS — Z6836 Body mass index (BMI) 36.0-36.9, adult: Secondary | ICD-10-CM | POA: Diagnosis not present

## 2023-02-19 DIAGNOSIS — L0390 Cellulitis, unspecified: Secondary | ICD-10-CM | POA: Diagnosis not present

## 2023-02-19 DIAGNOSIS — R2689 Other abnormalities of gait and mobility: Secondary | ICD-10-CM | POA: Diagnosis not present

## 2023-02-19 DIAGNOSIS — E11621 Type 2 diabetes mellitus with foot ulcer: Secondary | ICD-10-CM

## 2023-02-19 DIAGNOSIS — F0390 Unspecified dementia without behavioral disturbance: Secondary | ICD-10-CM | POA: Diagnosis not present

## 2023-02-19 DIAGNOSIS — L97519 Non-pressure chronic ulcer of other part of right foot with unspecified severity: Secondary | ICD-10-CM | POA: Diagnosis not present

## 2023-02-19 DIAGNOSIS — L03115 Cellulitis of right lower limb: Secondary | ICD-10-CM | POA: Diagnosis not present

## 2023-02-19 DIAGNOSIS — F329 Major depressive disorder, single episode, unspecified: Secondary | ICD-10-CM | POA: Diagnosis not present

## 2023-02-19 DIAGNOSIS — R600 Localized edema: Secondary | ICD-10-CM | POA: Diagnosis not present

## 2023-02-19 DIAGNOSIS — I10 Essential (primary) hypertension: Secondary | ICD-10-CM | POA: Diagnosis not present

## 2023-02-19 DIAGNOSIS — S90812D Abrasion, left foot, subsequent encounter: Secondary | ICD-10-CM | POA: Diagnosis not present

## 2023-02-19 DIAGNOSIS — R296 Repeated falls: Secondary | ICD-10-CM | POA: Diagnosis not present

## 2023-02-19 NOTE — Therapy (Signed)
OUTPATIENT PHYSICAL THERAPY Wound Treatment   Patient Name: Krista Payne MRN: [id number] DOB:[birth year], 82 y.o., female Today's Date: 02/19/2023   PCP: Benita Stabile, MD REFERRING PROVIDER: Benita Stabile, MD  END OF SESSION:   PT End of Session - 02/19/23 1712     Visit Number 25    Number of Visits 28    Date for PT Re-Evaluation 02/28/23    Authorization Type Primary: [employer] Medicare Secondary: Tricare (no auth, no VL)    Progress Note Due on Visit 29    PT Start Time 1510    PT Stop Time 1540    PT Time Calculation (min) 30 min    Activity Tolerance Patient tolerated treatment well    Behavior During Therapy WFL for tasks assessed/performed                       Past Medical History:  Diagnosis Date   Breast cancer    R   Chronic kidney disease    Diabetes mellitus without complication    Hyperlipidemia    Hypertension    Macular degeneration    Osteoarthritis    Past Surgical History:  Procedure Laterality Date   CATARACT EXTRACTION W/PHACO Right 02/07/2015   Procedure: CATARACT EXTRACTION PHACO AND INTRAOCULAR LENS PLACEMENT (IOC);  Surgeon: Gemma Payor, MD;  Location: [facility];  Service: Ophthalmology;  Laterality: Right;  CDE:12.72   CATARACT EXTRACTION W/PHACO Left 02/24/2015   Procedure: CATARACT EXTRACTION PHACO AND INTRAOCULAR LENS PLACEMENT LEFT EYE;  Surgeon: Gemma Payor, MD;  Location: [facility];  Service: Ophthalmology;  Laterality: Left;  CDE 11.69   COLONOSCOPY     Patient Active Problem List   Diagnosis Date Noted   Unspecified Escherichia coli (E. coli) as the cause of diseases classified elsewhere 02/23/2022   Sepsis 02/22/2022   Acute kidney failure 02/22/2022   Fall at home, initial encounter 02/22/2022   Personal history of COVID-19 02/02/2022   Pressure injury of skin 11/15/2021   Depression, unspecified 11/15/2021   Hypertensive heart and chronic kidney disease with heart failure and stage 1 through stage 4 chronic kidney disease, or  unspecified chronic kidney disease 11/15/2021   Insomnia 11/15/2021   Muscle weakness (generalized) 11/15/2021   Other abnormalities of gait and mobility 11/15/2021   Other forms of acute ischemic heart disease 11/15/2021   Traumatic ischemia of muscle 11/15/2021   Diastolic heart failure 11/15/2021   Diabetic foot infection 11/14/2021   Acute metabolic encephalopathy 11/14/2021   Chronic kidney disease, stage 3b 11/14/2021   Type 2 diabetes with nephropathy 11/14/2021   Elevated troponin 11/13/2021   Lactic acidosis 11/13/2021   COVID-19 virus infection 11/13/2021   Hyperglycemia due to diabetes mellitus 11/13/2021   Ankle wound, right, initial encounter 11/13/2021   Altered mental status 11/13/2021   Dehydration 11/13/2021   Long term (current) use of oral hypoglycemic drugs 10/29/2021   Unspecified macular degeneration 10/29/2021   Chronic kidney disease 05/17/2021   Personal history of malignant neoplasm of breast 10/29/2020   Malignant neoplasm of unspecified site of right female breast 07/16/2019   URI (upper respiratory infection) 11/20/2011   Other seasonal allergic rhinitis 11/23/2010   Urinary tract infection without hematuria 11/23/2010   RENAL INSUFFICIENCY 10/26/2010   OSTEOARTHRITIS 10/18/2010   Osteoarthritis 10/18/2010   Vitamin D deficiency 08/10/2009   Hyperlipidemia 08/10/2009   Body mass index (BMI) 39.0-39.9, adult 08/10/2009   EDEMA 01/20/2008   Anemia in chronic kidney disease 06/19/2007  Hypertension 06/02/2007    ONSET DATE: 07/05/22  REFERRING DIAG: ulcer of right foot due to type 2 diabetes mellitus  THERAPY DIAG:  Ulcer of right foot due to type 2 diabetes mellitus  Other abnormalities of gait and mobility  Rationale for Evaluation and Treatment: Rehabilitation  Wound Therapy - 02/19/23 1713     Subjective pt went to Dr this morning.  Comes today without dressing on LE.  States they gave her some new meds but hasn't gotten them yet and  doesn't know what it is.    Patient and Family Stated Goals wound to heal    Date of Onset 07/05/22    Prior Treatments cream    Evaluation and Treatment Procedures Explained to Patient/Family Yes    Evaluation and Treatment Procedures agreed to    Wound Properties Date First Assessed: 10/04/22 Time First Assessed: 1345 Wound Type: Venous stasis ulcer Location: Pretibial Location Orientation: Distal;Right Wound Description (Comments): medial ankle wound superior Present on Admission: Yes   Dressing Type Gauze (Comment);Compression wrap    Dressing Changed Changed    Dressing Status Old drainage    Dressing Change Frequency PRN    Site / Wound Assessment Red    Peri-wound Assessment Edema;Erythema (blanchable)    Drainage Amount Minimal    Drainage Description Serous    Treatment Cleansed;Debridement (Selective)    Selective Debridement (non-excisional) - Location dry skin able to be removed via wash cloth and 4x4 no sharp debridement needed.    Selective Debridement (non-excisional) - Tissue Removed slough and dry skin    Wound Therapy - Clinical Statement see below    Wound Therapy - Functional Problem List bathing, dressing    Factors Delaying/Impairing Wound Healing Altered sensation;Diabetes Mellitus;Vascular compromise;Multiple medical problems    Hydrotherapy Plan Debridement;Dressing change;Patient/family education    Wound Therapy - Frequency 2X / week    Wound Therapy - Current Recommendations PT    Wound Plan see below    Dressing  dry antifungal powder, 2 ABD pads. lymph netting, 1/2" foam, kerlix, cotton, coban, #5 netting.                 PATIENT EDUCATION: Education details: 01/01/23:  Contact MD and return due to wounds are not healing. 11/21/22, POC, changing dressing if soiled; EVAL Patient educated on exam findings, POC, scope of PT, HEP, and keeping dressing on unless soiled or panful. Person educated: Patient Education method: Explanation, Demonstration, and  Handouts Education comprehension: verbalized understanding, returned demonstration, verbal cues required, and tactile cues required   HOME EXERCISE PROGRAM: N/a   GOALS: Goals reviewed with patient? Yes  SHORT TERM GOALS: Target date: 10/25/2022    Wound to be free from slough to demonstrate healing.  Baseline: Goal status: IN PROGRESS   LONG TERM GOALS: Target date: 11/15/2022    Patient wound to be healed to reduce risk of infection. Baseline:  Goal status: IN PROGRESS  2.  Patient will state importance of compression garment use and be able to don to reduce risk of further wound development.  Baseline:  Goal status: IN PROGRESS   ASSESSMENT:  CLINICAL IMPRESSION:  Pt had appt at Dr. Scharlene Gloss office this morning and comes today without compression dressing with noted area inferior patella where she has scratched and is now weeping.  Educated not to scratch her legs and informed of the bacteria present under nails that could cause infection. Pt verbalized understanding.  No immediate benefit noted from application of antifungal powder and still  presents with dry, scaling skin with moist irritation beneath.  Debrided away scaling and devitalized tissue and cleansed well with antibacterial soap/sterile gauze.  Antifungal powder was applied to LE prior to re bandaging using 1/2" foam to prevent bottlenecking.  Pt asked to bring meds next visit to see what she is taking to assist with her LE healing.     OBJECTIVE IMPAIRMENTS: Abnormal gait, decreased activity tolerance, decreased balance, decreased mobility, difficulty walking, impaired sensation, and improper body mechanics.   ACTIVITY LIMITATIONS: carrying, lifting, standing, squatting, stairs, transfers, locomotion level, and caring for others  PARTICIPATION LIMITATIONS: meal prep, cleaning, laundry, shopping, community activity, and yard work  PERSONAL FACTORS: 3+ comorbidities: DM, CKD, HLD, HTN  are also affecting patient's  functional outcome.   REHAB POTENTIAL: Good  CLINICAL DECISION MAKING: Evolving/moderate complexity  EVALUATION COMPLEXITY: Moderate  PLAN: PT FREQUENCY: 2x/week  PT DURATION: 4 weeks  PLANNED INTERVENTIONS: Therapeutic exercises, Therapeutic activity, Neuromuscular re-education, Balance training, Gait training, Patient/Family education, Joint manipulation, Joint mobilization, Stair training, Orthotic/Fit training, DME instructions, Aquatic Therapy, Dry Needling, Electrical stimulation, Spinal manipulation, Spinal mobilization, Cryotherapy, Moist heat, Compression bandaging, scar mobilization, Splintting, Taping, Traction, Ultrasound, Ionotophoresis /ml Dexamethasone, and Manual therapy   PLAN FOR NEXT SESSION: Continue wound care.  Follow up with effectiveness of antifungal.    Lurena Nida, PTA/CLT [facility] Ph: [phone]   Lurena Nida, PTA 02/19/2023, 5:15 PM

## 2023-02-21 ENCOUNTER — Encounter (HOSPITAL_COMMUNITY): Payer: Self-pay | Admitting: Physical Therapy

## 2023-02-21 ENCOUNTER — Ambulatory Visit (HOSPITAL_COMMUNITY): Payer: Medicare HMO | Admitting: Physical Therapy

## 2023-02-21 DIAGNOSIS — E11621 Type 2 diabetes mellitus with foot ulcer: Secondary | ICD-10-CM | POA: Diagnosis not present

## 2023-02-21 DIAGNOSIS — R2689 Other abnormalities of gait and mobility: Secondary | ICD-10-CM

## 2023-02-21 DIAGNOSIS — L97519 Non-pressure chronic ulcer of other part of right foot with unspecified severity: Secondary | ICD-10-CM | POA: Diagnosis not present

## 2023-02-21 NOTE — Therapy (Signed)
OUTPATIENT PHYSICAL THERAPY Wound Treatment   Patient Name: Krista Payne MRN: [id number] DOB:1940-12-12, 82 y.o., female Today's Date: 02/21/2023   PCP: Benita Stabile, MD REFERRING PROVIDER: Benita Stabile, MD  END OF SESSION:   PT End of Session - 02/21/23 1436     Visit Number 26    Number of Visits 28    Date for PT Re-Evaluation 02/28/23    Authorization Type Primary: [employer] Medicare Secondary: Tricare (no auth, no VL)    Progress Note Due on Visit 29    PT Start Time 1436    PT Stop Time 1517    PT Time Calculation (min) 41 min    Activity Tolerance Patient tolerated treatment well    Behavior During Therapy WFL for tasks assessed/performed                       Past Medical History:  Diagnosis Date   Breast cancer    R   Chronic kidney disease    Diabetes mellitus without complication    Hyperlipidemia    Hypertension    Macular degeneration    Osteoarthritis    Past Surgical History:  Procedure Laterality Date   CATARACT EXTRACTION W/PHACO Right 02/07/2015   Procedure: CATARACT EXTRACTION PHACO AND INTRAOCULAR LENS PLACEMENT (IOC);  Surgeon: Gemma Payor, MD;  Location: [facility];  Service: Ophthalmology;  Laterality: Right;  CDE:12.72   CATARACT EXTRACTION W/PHACO Left 02/24/2015   Procedure: CATARACT EXTRACTION PHACO AND INTRAOCULAR LENS PLACEMENT LEFT EYE;  Surgeon: Gemma Payor, MD;  Location: [facility];  Service: Ophthalmology;  Laterality: Left;  CDE 11.69   COLONOSCOPY     Patient Active Problem List   Diagnosis Date Noted   Unspecified Escherichia coli (E. coli) as the cause of diseases classified elsewhere 02/23/2022   Sepsis 02/22/2022   Acute kidney failure 02/22/2022   Fall at home, initial encounter 02/22/2022   Personal history of COVID-19 02/02/2022   Pressure injury of skin 11/15/2021   Depression, unspecified 11/15/2021   Hypertensive heart and chronic kidney disease with heart failure and stage 1 through stage 4 chronic kidney disease, or  unspecified chronic kidney disease 11/15/2021   Insomnia 11/15/2021   Muscle weakness (generalized) 11/15/2021   Other abnormalities of gait and mobility 11/15/2021   Other forms of acute ischemic heart disease 11/15/2021   Traumatic ischemia of muscle 11/15/2021   Diastolic heart failure 11/15/2021   Diabetic foot infection 11/14/2021   Acute metabolic encephalopathy 11/14/2021   Chronic kidney disease, stage 3b 11/14/2021   Type 2 diabetes with nephropathy 11/14/2021   Elevated troponin 11/13/2021   Lactic acidosis 11/13/2021   COVID-19 virus infection 11/13/2021   Hyperglycemia due to diabetes mellitus 11/13/2021   Ankle wound, right, initial encounter 11/13/2021   Altered mental status 11/13/2021   Dehydration 11/13/2021   Long term (current) use of oral hypoglycemic drugs 10/29/2021   Unspecified macular degeneration 10/29/2021   Chronic kidney disease 05/17/2021   Personal history of malignant neoplasm of breast 10/29/2020   Malignant neoplasm of unspecified site of right female breast 07/16/2019   URI (upper respiratory infection) 11/20/2011   Other seasonal allergic rhinitis 11/23/2010   Urinary tract infection without hematuria 11/23/2010   RENAL INSUFFICIENCY 10/26/2010   OSTEOARTHRITIS 10/18/2010   Osteoarthritis 10/18/2010   Vitamin D deficiency 08/10/2009   Hyperlipidemia 08/10/2009   Body mass index (BMI) 39.0-39.9, adult 08/10/2009   EDEMA 01/20/2008   Anemia in chronic kidney disease 06/19/2007  Hypertension 06/02/2007    ONSET DATE: 07/05/22  REFERRING DIAG: ulcer of right foot due to type 2 diabetes mellitus  THERAPY DIAG:  Ulcer of right foot due to type 2 diabetes mellitus  Other abnormalities of gait and mobility  Rationale for Evaluation and Treatment: Rehabilitation  Wound Therapy - 02/21/23 0001     Subjective Leg doesnt hurt. not sure if she got new antibiotics or not    Patient and Family Stated Goals wound to heal    Date of Onset 07/05/22     Prior Treatments cream    Evaluation and Treatment Procedures Explained to Patient/Family Yes    Evaluation and Treatment Procedures agreed to    Wound Properties Date First Assessed: 10/04/22 Time First Assessed: 1345 Wound Type: Venous stasis ulcer Location: Pretibial Location Orientation: Distal;Right Wound Description (Comments): medial ankle wound superior Present on Admission: Yes   Dressing Type Gauze (Comment);Compression wrap    Dressing Status Old drainage    Dressing Change Frequency PRN    Site / Wound Assessment Red    Peri-wound Assessment Edema;Erythema (blanchable)    Drainage Amount Minimal    Drainage Description Serous    Treatment Cleansed;Debridement (Selective)    Selective Debridement (non-excisional) - Location dry skin able to be removed via wash cloth and forceps    Selective Debridement (non-excisional) - Tissue Removed dry skin    Wound Therapy - Clinical Statement see below    Wound Therapy - Functional Problem List bathing, dressing    Factors Delaying/Impairing Wound Healing Altered sensation;Diabetes Mellitus;Vascular compromise;Multiple medical problems    Hydrotherapy Plan Debridement;Dressing change;Patient/family education    Wound Therapy - Frequency 2X / week    Wound Therapy - Current Recommendations PT    Wound Plan see below    Dressing  dry antifungal powder, 2 ABD pads. lymph netting, 1/2" foam, kerlix, cotton, coban, #5 netting.                 PATIENT EDUCATION: Education details: 01/01/23:  Contact MD and return due to wounds are not healing. 11/21/22, POC, changing dressing if soiled; EVAL Patient educated on exam findings, POC, scope of PT, HEP, and keeping dressing on unless soiled or panful. Person educated: Patient Education method: Explanation, Demonstration, and Handouts Education comprehension: verbalized understanding, returned demonstration, verbal cues required, and tactile cues required   HOME EXERCISE  PROGRAM: N/a   GOALS: Goals reviewed with patient? Yes  SHORT TERM GOALS: Target date: 10/25/2022    Wound to be free from slough to demonstrate healing.  Baseline: Goal status: IN PROGRESS   LONG TERM GOALS: Target date: 11/15/2022    Patient wound to be healed to reduce risk of infection. Baseline:  Goal status: IN PROGRESS  2.  Patient will state importance of compression garment use and be able to don to reduce risk of further wound development.  Baseline:  Goal status: IN PROGRESS   ASSESSMENT:  CLINICAL IMPRESSION:  Patient with decreased redness in RLE although present along with flaking skin.  Debrided away scaling and devitalized tissue and cleansed well with antibacterial soap/sterile gauze.  Antifungal powder was applied to LE prior to re bandaging using 1/2" foam to prevent bottlenecking.  Pt asked to bring meds next visit to see what she is taking to assist with her LE healing.     OBJECTIVE IMPAIRMENTS: Abnormal gait, decreased activity tolerance, decreased balance, decreased mobility, difficulty walking, impaired sensation, and improper body mechanics.   ACTIVITY LIMITATIONS: carrying, lifting, standing, squatting,  stairs, transfers, locomotion level, and caring for others  PARTICIPATION LIMITATIONS: meal prep, cleaning, laundry, shopping, community activity, and yard work  PERSONAL FACTORS: 3+ comorbidities: DM, CKD, HLD, HTN  are also affecting patient's functional outcome.   REHAB POTENTIAL: Good  CLINICAL DECISION MAKING: Evolving/moderate complexity  EVALUATION COMPLEXITY: Moderate  PLAN: PT FREQUENCY: 2x/week  PT DURATION: 4 weeks  PLANNED INTERVENTIONS: Therapeutic exercises, Therapeutic activity, Neuromuscular re-education, Balance training, Gait training, Patient/Family education, Joint manipulation, Joint mobilization, Stair training, Orthotic/Fit training, DME instructions, Aquatic Therapy, Dry Needling, Electrical stimulation, Spinal  manipulation, Spinal mobilization, Cryotherapy, Moist heat, Compression bandaging, scar mobilization, Splintting, Taping, Traction, Ultrasound, Ionotophoresis /ml Dexamethasone, and Manual therapy   PLAN FOR NEXT SESSION: Continue wound care.  Follow up with effectiveness of antifungal.       Reola Mosher Nobuko Gsell, PT 02/21/2023, 3:21 PM

## 2023-02-25 DIAGNOSIS — E782 Mixed hyperlipidemia: Secondary | ICD-10-CM | POA: Diagnosis not present

## 2023-02-26 ENCOUNTER — Ambulatory Visit (HOSPITAL_COMMUNITY): Payer: Medicare HMO | Admitting: Physical Therapy

## 2023-02-26 DIAGNOSIS — R2689 Other abnormalities of gait and mobility: Secondary | ICD-10-CM | POA: Diagnosis not present

## 2023-02-26 DIAGNOSIS — L97519 Non-pressure chronic ulcer of other part of right foot with unspecified severity: Secondary | ICD-10-CM | POA: Diagnosis not present

## 2023-02-26 DIAGNOSIS — E11621 Type 2 diabetes mellitus with foot ulcer: Secondary | ICD-10-CM | POA: Diagnosis not present

## 2023-02-26 NOTE — Therapy (Signed)
OUTPATIENT PHYSICAL THERAPY Wound Treatment   Patient Name: EMMYLOU BIEKER MRN: [id number] DOB:10-28-1941, 82 y.o., female Today's Date: 02/26/2023   PCP: Benita Stabile, MD REFERRING PROVIDER: Benita Stabile, MD  END OF SESSION:   PT End of Session - 02/26/23 1633     Visit Number 27    Number of Visits 28    Date for PT Re-Evaluation 02/28/23    Authorization Type Primary: [employer] Medicare Secondary: Tricare (no auth, no VL)    Progress Note Due on Visit 29    PT Start Time 1520    PT Stop Time 1605    PT Time Calculation (min) 45 min    Activity Tolerance Patient tolerated treatment well    Behavior During Therapy WFL for tasks assessed/performed                       Past Medical History:  Diagnosis Date   Breast cancer (HCC)    R   Chronic kidney disease    Diabetes mellitus without complication (HCC)    Hyperlipidemia    Hypertension    Macular degeneration    Osteoarthritis    Past Surgical History:  Procedure Laterality Date   CATARACT EXTRACTION W/PHACO Right 02/07/2015   Procedure: CATARACT EXTRACTION PHACO AND INTRAOCULAR LENS PLACEMENT (IOC);  Surgeon: Gemma Payor, MD;  Location: [facility];  Service: Ophthalmology;  Laterality: Right;  CDE:12.72   CATARACT EXTRACTION W/PHACO Left 02/24/2015   Procedure: CATARACT EXTRACTION PHACO AND INTRAOCULAR LENS PLACEMENT LEFT EYE;  Surgeon: Gemma Payor, MD;  Location: [facility];  Service: Ophthalmology;  Laterality: Left;  CDE 11.69   COLONOSCOPY     Patient Active Problem List   Diagnosis Date Noted   Unspecified Escherichia coli (E. coli) as the cause of diseases classified elsewhere 02/23/2022   Sepsis (HCC) 02/22/2022   Acute kidney failure (HCC) 02/22/2022   Fall at home, initial encounter 02/22/2022   Personal history of COVID-19 02/02/2022   Pressure injury of skin 11/15/2021   Depression, unspecified 11/15/2021   Hypertensive heart and chronic kidney disease with heart failure and stage 1 through stage 4  chronic kidney disease, or unspecified chronic kidney disease (HCC) 11/15/2021   Insomnia 11/15/2021   Muscle weakness (generalized) 11/15/2021   Other abnormalities of gait and mobility 11/15/2021   Other forms of acute ischemic heart disease 11/15/2021   Traumatic ischemia of muscle (HCC) 11/15/2021   Diastolic heart failure (HCC) 11/15/2021   Diabetic foot infection (HCC) 11/14/2021   Acute metabolic encephalopathy 11/14/2021   Chronic kidney disease, stage 3b (HCC) 11/14/2021   Type 2 diabetes with nephropathy (HCC) 11/14/2021   Elevated troponin 11/13/2021   Lactic acidosis 11/13/2021   COVID-19 virus infection 11/13/2021   Hyperglycemia due to diabetes mellitus (HCC) 11/13/2021   Ankle wound, right, initial encounter 11/13/2021   Altered mental status 11/13/2021   Dehydration 11/13/2021   Long term (current) use of oral hypoglycemic drugs 10/29/2021   Unspecified macular degeneration 10/29/2021   Chronic kidney disease 05/17/2021   Personal history of malignant neoplasm of breast 10/29/2020   Malignant neoplasm of unspecified site of right female breast (HCC) 07/16/2019   URI (upper respiratory infection) 11/20/2011   Other seasonal allergic rhinitis 11/23/2010   Urinary tract infection without hematuria 11/23/2010   RENAL INSUFFICIENCY 10/26/2010   OSTEOARTHRITIS 10/18/2010   Osteoarthritis 10/18/2010   Vitamin D deficiency 08/10/2009   Hyperlipidemia 08/10/2009   Body mass index (BMI) 39.0-39.9, adult 08/10/2009  EDEMA 01/20/2008   Anemia in chronic kidney disease 06/19/2007   Hypertension 06/02/2007    ONSET DATE: 07/05/22  REFERRING DIAG: ulcer of right foot due to type 2 diabetes mellitus  THERAPY DIAG:  Ulcer of right foot due to type 2 diabetes mellitus (HCC)  Other abnormalities of gait and mobility  Rationale for Evaluation and Treatment: Rehabilitation  Wound Therapy - 02/26/23 1633     Subjective pt states she's been on a new antibiotic for a couple  days.  Forgot to bring the info on it today.    Patient and Family Stated Goals wound to heal    Date of Onset 07/05/22    Prior Treatments cream    Evaluation and Treatment Procedures Explained to Patient/Family Yes    Evaluation and Treatment Procedures agreed to    Wound Properties Date First Assessed: 10/04/22 Time First Assessed: 1345 Wound Type: Venous stasis ulcer Location: Pretibial Location Orientation: Distal;Right Wound Description (Comments): medial ankle wound superior Present on Admission: Yes   Dressing Type Gauze (Comment);Compression wrap    Dressing Changed Changed    Dressing Status Old drainage    Dressing Change Frequency PRN    Site / Wound Assessment Red    Peri-wound Assessment Edema;Erythema (blanchable)    Drainage Amount Minimal    Drainage Description Serous    Treatment Cleansed;Debridement (Selective)    Selective Debridement (non-excisional) - Location dry skin able to be removed via wash cloth and forceps    Selective Debridement (non-excisional) - Tissue Removed dry skin    Wound Therapy - Clinical Statement see below    Wound Therapy - Functional Problem List bathing, dressing    Factors Delaying/Impairing Wound Healing Altered sensation;Diabetes Mellitus;Vascular compromise;Multiple medical problems    Hydrotherapy Plan Debridement;Dressing change;Patient/family education    Wound Therapy - Frequency 2X / week    Wound Therapy - Current Recommendations PT    Wound Plan see below    Dressing  dry antifungal powder, 2 ABD pads. lymph netting, 1/2" foam, kerlix, cotton, coban, #5 netting.                 PATIENT EDUCATION: Education details: 01/01/23:  Contact MD and return due to wounds are not healing. 11/21/22, POC, changing dressing if soiled; EVAL Patient educated on exam findings, POC, scope of PT, HEP, and keeping dressing on unless soiled or panful. Person educated: Patient Education method: Explanation, Demonstration, and  Handouts Education comprehension: verbalized understanding, returned demonstration, verbal cues required, and tactile cues required   HOME EXERCISE PROGRAM: N/a   GOALS: Goals reviewed with patient? Yes  SHORT TERM GOALS: Target date: 10/25/2022    Wound to be free from slough to demonstrate healing.  Baseline: Goal status: IN PROGRESS   LONG TERM GOALS: Target date: 11/15/2022    Patient wound to be healed to reduce risk of infection. Baseline:  Goal status: IN PROGRESS  2.  Patient will state importance of compression garment use and be able to don to reduce risk of further wound development.  Baseline:  Goal status: IN PROGRESS   ASSESSMENT:  CLINICAL IMPRESSION:  Patient with dressing stuck in places from drying out areas and some drainage seeped through anterior piece of foam.  Also noted tight band on top of dressing with edema over top.  Cleansed well with soak and sterile gauze and removed flaking skin.  Reapplied antifungal powder prior to redressing. Pt will continue to benefit from skilled therapy to heal LE and improve integrity of  skin on Rt LE.      OBJECTIVE IMPAIRMENTS: Abnormal gait, decreased activity tolerance, decreased balance, decreased mobility, difficulty walking, impaired sensation, and improper body mechanics.   ACTIVITY LIMITATIONS: carrying, lifting, standing, squatting, stairs, transfers, locomotion level, and caring for others  PARTICIPATION LIMITATIONS: meal prep, cleaning, laundry, shopping, community activity, and yard work  PERSONAL FACTORS: 3+ comorbidities: DM, CKD, HLD, HTN  are also affecting patient's functional outcome.   REHAB POTENTIAL: Good  CLINICAL DECISION MAKING: Evolving/moderate complexity  EVALUATION COMPLEXITY: Moderate  PLAN: PT FREQUENCY: 2x/week  PT DURATION: 4 weeks  PLANNED INTERVENTIONS: Therapeutic exercises, Therapeutic activity, Neuromuscular re-education, Balance training, Gait training, Patient/Family  education, Joint manipulation, Joint mobilization, Stair training, Orthotic/Fit training, DME instructions, Aquatic Therapy, Dry Needling, Electrical stimulation, Spinal manipulation, Spinal mobilization, Cryotherapy, Moist heat, Compression bandaging, scar mobilization, Splintting, Taping, Traction, Ultrasound, Ionotophoresis 4mg /ml Dexamethasone, and Manual therapy   PLAN FOR NEXT SESSION: Continue wound care.  Follow up with antibiotic; change dressing if needed to promote healing environment.     Lurena Nida, PTA/CLT [facility] Ph: [phone]   Lurena Nida, PTA 02/26/2023, 4:35 PM

## 2023-02-28 ENCOUNTER — Encounter (HOSPITAL_COMMUNITY): Payer: Self-pay | Admitting: Physical Therapy

## 2023-02-28 ENCOUNTER — Ambulatory Visit (HOSPITAL_COMMUNITY): Payer: Medicare HMO | Attending: Internal Medicine | Admitting: Physical Therapy

## 2023-02-28 DIAGNOSIS — E11621 Type 2 diabetes mellitus with foot ulcer: Secondary | ICD-10-CM

## 2023-02-28 DIAGNOSIS — L97519 Non-pressure chronic ulcer of other part of right foot with unspecified severity: Secondary | ICD-10-CM | POA: Insufficient documentation

## 2023-02-28 DIAGNOSIS — R2689 Other abnormalities of gait and mobility: Secondary | ICD-10-CM | POA: Insufficient documentation

## 2023-02-28 NOTE — Therapy (Signed)
OUTPATIENT PHYSICAL THERAPY Wound Treatment   Patient Name: Krista Payne MRN: [id number] DOB:[birth year], 82 y.o., female Today's Date: 02/28/2023   PCP: Benita Stabile, MD REFERRING PROVIDER: Benita Stabile, MD  END OF SESSION:   PT End of Session - 02/28/23 1437     Visit Number 28    Number of Visits 36    Date for PT Re-Evaluation 03/28/23    Authorization Type Primary: [employer] Medicare Secondary: Tricare (no auth, no VL)    Progress Note Due on Visit 36    PT Start Time 1436    PT Stop Time 1510    PT Time Calculation (min) 34 min    Activity Tolerance Patient tolerated treatment well    Behavior During Therapy WFL for tasks assessed/performed                       Past Medical History:  Diagnosis Date   Breast cancer (HCC)    R   Chronic kidney disease    Diabetes mellitus without complication (HCC)    Hyperlipidemia    Hypertension    Macular degeneration    Osteoarthritis    Past Surgical History:  Procedure Laterality Date   CATARACT EXTRACTION W/PHACO Right 02/07/2015   Procedure: CATARACT EXTRACTION PHACO AND INTRAOCULAR LENS PLACEMENT (IOC);  Surgeon: Gemma Payor, MD;  Location: [facility];  Service: Ophthalmology;  Laterality: Right;  CDE:12.72   CATARACT EXTRACTION W/PHACO Left 02/24/2015   Procedure: CATARACT EXTRACTION PHACO AND INTRAOCULAR LENS PLACEMENT LEFT EYE;  Surgeon: Gemma Payor, MD;  Location: [facility];  Service: Ophthalmology;  Laterality: Left;  CDE 11.69   COLONOSCOPY     Patient Active Problem List   Diagnosis Date Noted   Unspecified Escherichia coli (E. coli) as the cause of diseases classified elsewhere 02/23/2022   Sepsis (HCC) 02/22/2022   Acute kidney failure (HCC) 02/22/2022   Fall at home, initial encounter 02/22/2022   Personal history of COVID-19 02/02/2022   Pressure injury of skin 11/15/2021   Depression, unspecified 11/15/2021   Hypertensive heart and chronic kidney disease with heart failure and stage 1 through stage 4  chronic kidney disease, or unspecified chronic kidney disease (HCC) 11/15/2021   Insomnia 11/15/2021   Muscle weakness (generalized) 11/15/2021   Other abnormalities of gait and mobility 11/15/2021   Other forms of acute ischemic heart disease 11/15/2021   Traumatic ischemia of muscle (HCC) 11/15/2021   Diastolic heart failure (HCC) 11/15/2021   Diabetic foot infection (HCC) 11/14/2021   Acute metabolic encephalopathy 11/14/2021   Chronic kidney disease, stage 3b (HCC) 11/14/2021   Type 2 diabetes with nephropathy (HCC) 11/14/2021   Elevated troponin 11/13/2021   Lactic acidosis 11/13/2021   COVID-19 virus infection 11/13/2021   Hyperglycemia due to diabetes mellitus (HCC) 11/13/2021   Ankle wound, right, initial encounter 11/13/2021   Altered mental status 11/13/2021   Dehydration 11/13/2021   Long term (current) use of oral hypoglycemic drugs 10/29/2021   Unspecified macular degeneration 10/29/2021   Chronic kidney disease 05/17/2021   Personal history of malignant neoplasm of breast 10/29/2020   Malignant neoplasm of unspecified site of right female breast (HCC) 07/16/2019   URI (upper respiratory infection) 11/20/2011   Other seasonal allergic rhinitis 11/23/2010   Urinary tract infection without hematuria 11/23/2010   RENAL INSUFFICIENCY 10/26/2010   OSTEOARTHRITIS 10/18/2010   Osteoarthritis 10/18/2010   Vitamin D deficiency 08/10/2009   Hyperlipidemia 08/10/2009   Body mass index (BMI) 39.0-39.9, adult 08/10/2009  EDEMA 01/20/2008   Anemia in chronic kidney disease 06/19/2007   Hypertension 06/02/2007    ONSET DATE: 07/05/22  REFERRING DIAG: ulcer of right foot due to type 2 diabetes mellitus  THERAPY DIAG:  Ulcer of right foot due to type 2 diabetes mellitus (HCC)  Other abnormalities of gait and mobility  Rationale for Evaluation and Treatment: Rehabilitation  Wound Therapy - 02/28/23 0001     Subjective pt states she's been on a new antibiotic for a couple  days.  on Cephalexin .    Patient and Family Stated Goals wound to heal    Date of Onset 07/05/22    Prior Treatments cream    Evaluation and Treatment Procedures Explained to Patient/Family Yes    Evaluation and Treatment Procedures agreed to    Wound Properties Date First Assessed: 10/04/22 Time First Assessed: 1345 Wound Type: Venous stasis ulcer Location: Pretibial Location Orientation: Distal;Right Wound Description (Comments): medial ankle wound superior Present on Admission: Yes   Dressing Type Gauze (Comment);Compression wrap    Dressing Changed Changed    Dressing Status Old drainage    Dressing Change Frequency PRN    Site / Wound Assessment Red    Peri-wound Assessment Edema;Erythema (blanchable)    Drainage Amount Minimal    Treatment Cleansed;Debridement (Selective)    Selective Debridement (non-excisional) - Location dry skin able to be removed via wash cloth and forceps    Selective Debridement (non-excisional) - Tissue Removed dry skin    Wound Therapy - Clinical Statement see below    Wound Therapy - Functional Problem List bathing, dressing    Factors Delaying/Impairing Wound Healing Altered sensation;Diabetes Mellitus;Vascular compromise;Multiple medical problems    Hydrotherapy Plan Debridement;Dressing change;Patient/family education    Wound Therapy - Frequency 2X / week    Wound Therapy - Current Recommendations PT    Wound Plan see below    Dressing  dry antifungal powder, . lymph netting, 1/2" foam, kerlix, cotton, coban, #5 netting.                 PATIENT EDUCATION: Education details: 01/01/23:  Contact MD and return due to wounds are not healing. 11/21/22, POC, changing dressing if soiled; EVAL Patient educated on exam findings, POC, scope of PT, HEP, and keeping dressing on unless soiled or panful. Person educated: Patient Education method: Explanation, Demonstration, and Handouts Education comprehension: verbalized understanding, returned  demonstration, verbal cues required, and tactile cues required   HOME EXERCISE PROGRAM: N/a   GOALS: Goals reviewed with patient? Yes  SHORT TERM GOALS: Target date: 10/25/2022    Wound to be free from slough to demonstrate healing.  Baseline: Goal status: MET   LONG TERM GOALS: Target date: 11/15/2022    Patient wound to be healed to reduce risk of infection. Baseline:  Goal status: IN PROGRESS  2.  Patient will state importance of compression garment use and be able to don to reduce risk of further wound development.  Baseline:  Goal status: IN PROGRESS   ASSESSMENT:  CLINICAL IMPRESSION:  Patient one goal met due to no slough remaining at wound. Long term goals not met due to continued erythema, skin irritation , peeling, drainage from RLE. Leg appearing better today than prior sessions and able to debride a lot of flaking skin from leg. Continued irritation present. Continued with compression wrap. Patient will continue to benefit from PT to promote wound healing.   OBJECTIVE IMPAIRMENTS: Abnormal gait, decreased activity tolerance, decreased balance, decreased mobility, difficulty walking, impaired sensation,  and improper body mechanics.   ACTIVITY LIMITATIONS: carrying, lifting, standing, squatting, stairs, transfers, locomotion level, and caring for others  PARTICIPATION LIMITATIONS: meal prep, cleaning, laundry, shopping, community activity, and yard work  PERSONAL FACTORS: 3+ comorbidities: DM, CKD, HLD, HTN  are also affecting patient's functional outcome.   REHAB POTENTIAL: Good  CLINICAL DECISION MAKING: Evolving/moderate complexity  EVALUATION COMPLEXITY: Moderate  PLAN: PT FREQUENCY: 2x/week  PT DURATION: 4 weeks  PLANNED INTERVENTIONS: Therapeutic exercises, Therapeutic activity, Neuromuscular re-education, Balance training, Gait training, Patient/Family education, Joint manipulation, Joint mobilization, Stair training, Orthotic/Fit training, DME  instructions, Aquatic Therapy, Dry Needling, Electrical stimulation, Spinal manipulation, Spinal mobilization, Cryotherapy, Moist heat, Compression bandaging, scar mobilization, Splintting, Taping, Traction, Ultrasound, Ionotophoresis 4mg /ml Dexamethasone, and Manual therapy   PLAN FOR NEXT SESSION: Continue wound care.  Follow up with antibiotic; change dressing if needed to promote healing environment.        Reola Mosher Ottilia Pippenger, PT 02/28/2023, 3:15 PM

## 2023-03-01 DIAGNOSIS — L03115 Cellulitis of right lower limb: Secondary | ICD-10-CM | POA: Diagnosis not present

## 2023-03-01 DIAGNOSIS — I10 Essential (primary) hypertension: Secondary | ICD-10-CM | POA: Diagnosis not present

## 2023-03-01 DIAGNOSIS — L89151 Pressure ulcer of sacral region, stage 1: Secondary | ICD-10-CM | POA: Diagnosis not present

## 2023-03-01 DIAGNOSIS — F329 Major depressive disorder, single episode, unspecified: Secondary | ICD-10-CM | POA: Diagnosis not present

## 2023-03-01 DIAGNOSIS — I87311 Chronic venous hypertension (idiopathic) with ulcer of right lower extremity: Secondary | ICD-10-CM | POA: Diagnosis not present

## 2023-03-01 DIAGNOSIS — L97919 Non-pressure chronic ulcer of unspecified part of right lower leg with unspecified severity: Secondary | ICD-10-CM | POA: Diagnosis not present

## 2023-03-01 DIAGNOSIS — E1122 Type 2 diabetes mellitus with diabetic chronic kidney disease: Secondary | ICD-10-CM | POA: Diagnosis not present

## 2023-03-01 DIAGNOSIS — N184 Chronic kidney disease, stage 4 (severe): Secondary | ICD-10-CM | POA: Diagnosis not present

## 2023-03-01 DIAGNOSIS — R296 Repeated falls: Secondary | ICD-10-CM | POA: Diagnosis not present

## 2023-03-01 DIAGNOSIS — L0390 Cellulitis, unspecified: Secondary | ICD-10-CM | POA: Diagnosis not present

## 2023-03-01 DIAGNOSIS — F0390 Unspecified dementia without behavioral disturbance: Secondary | ICD-10-CM | POA: Diagnosis not present

## 2023-03-04 ENCOUNTER — Encounter (HOSPITAL_COMMUNITY): Payer: Self-pay | Admitting: Physical Therapy

## 2023-03-04 ENCOUNTER — Ambulatory Visit (HOSPITAL_COMMUNITY): Payer: Medicare HMO | Admitting: Physical Therapy

## 2023-03-04 ENCOUNTER — Encounter: Payer: Self-pay | Admitting: Nurse Practitioner

## 2023-03-04 DIAGNOSIS — R2689 Other abnormalities of gait and mobility: Secondary | ICD-10-CM | POA: Diagnosis not present

## 2023-03-04 DIAGNOSIS — E11621 Type 2 diabetes mellitus with foot ulcer: Secondary | ICD-10-CM

## 2023-03-04 DIAGNOSIS — L97519 Non-pressure chronic ulcer of other part of right foot with unspecified severity: Secondary | ICD-10-CM | POA: Diagnosis not present

## 2023-03-04 NOTE — Therapy (Signed)
OUTPATIENT PHYSICAL THERAPY Wound Treatment   Patient Name: IMAHNI ODOM MRN: [id number] DOB:1940/11/09, 82 y.o., female Today's Date: 03/04/2023   PCP: Benita Stabile, MD REFERRING PROVIDER: Benita Stabile, MD  END OF SESSION:   PT End of Session - 03/04/23 1435     Visit Number 29    Number of Visits 36    Date for PT Re-Evaluation 03/28/23    Authorization Type Primary: [employer] Medicare Secondary: T[employer]are (no auth, no VL)    Progress Note Due on Visit 36    PT Start Time 1435    PT Stop Time 1510    PT Time Calculation (min) 35 min    Activity Tolerance Patient tolerated treatment well    Behavior During Therapy WFL for tasks assessed/performed                       Past Medical History:  Diagnosis Date   Breast cancer (HCC)    R   Chronic kidney disease    Diabetes mellitus without complication (HCC)    Hyperlipidemia    Hypertension    Macular degeneration    Osteoarthritis    Past Surgical History:  Procedure Laterality Date   CATARACT EXTRACTION W/PHACO Right 02/07/2015   Procedure: CATARACT EXTRACTION PHACO AND INTRAOCULAR LENS PLACEMENT (IOC);  Surgeon: Gemma Payor, MD;  Location: [facility];  Service: Ophthalmology;  Laterality: Right;  CDE:12.72   CATARACT EXTRACTION W/PHACO Left 02/24/2015   Procedure: CATARACT EXTRACTION PHACO AND INTRAOCULAR LENS PLACEMENT LEFT EYE;  Surgeon: Gemma Payor, MD;  Location: [facility];  Service: Ophthalmology;  Laterality: Left;  CDE 11.69   COLONOSCOPY     Patient Active Problem List   Diagnosis Date Noted   Unspecified Escherichia coli (E. coli) as the cause of diseases classified elsewhere 02/23/2022   Sepsis (HCC) 02/22/2022   Acute kidney failure (HCC) 02/22/2022   Fall at home, initial encounter 02/22/2022   Personal history of COVID-19 02/02/2022   Pressure injury of skin 11/15/2021   Depression, unspecified 11/15/2021   Hypertensive heart and chronic kidney disease with heart failure and stage 1 through stage 4  chronic kidney disease, or unspecified chronic kidney disease (HCC) 11/15/2021   Insomnia 11/15/2021   Muscle weakness (generalized) 11/15/2021   Other abnormalities of gait and mobility 11/15/2021   Other forms of acute ischemic heart disease 11/15/2021   Traumatic ischemia of muscle (HCC) 11/15/2021   Diastolic heart failure (HCC) 11/15/2021   Diabetic foot infection (HCC) 11/14/2021   Acute metabolic encephalopathy 11/14/2021   Chronic kidney disease, stage 3b (HCC) 11/14/2021   Type 2 diabetes with nephropathy (HCC) 11/14/2021   Elevated troponin 11/13/2021   Lactic acidosis 11/13/2021   COVID-19 virus infection 11/13/2021   Hyperglycemia due to diabetes mellitus (HCC) 11/13/2021   Ankle wound, right, initial encounter 11/13/2021   Altered mental status 11/13/2021   Dehydration 11/13/2021   Long term (current) use of oral hypoglycemic drugs 10/29/2021   Unspecified macular degeneration 10/29/2021   Chronic kidney disease 05/17/2021   Personal history of malignant neoplasm of breast 10/29/2020   Malignant neoplasm of unspecified site of right female breast (HCC) 07/16/2019   URI (upper respiratory infection) 11/20/2011   Other seasonal allergic rhinitis 11/23/2010   Urinary tract infection without hematuria 11/23/2010   RENAL INSUFFICIENCY 10/26/2010   OSTEOARTHRITIS 10/18/2010   Osteoarthritis 10/18/2010   Vitamin D deficiency 08/10/2009   Hyperlipidemia 08/10/2009   Body mass index (BMI) 39.0-39.9, adult 08/10/2009  EDEMA 01/20/2008   Anemia in chronic kidney disease 06/19/2007   Hypertension 06/02/2007    ONSET DATE: 07/05/22  REFERRING DIAG: ulcer of right foot due to type 2 diabetes mellitus  THERAPY DIAG:  Ulcer of right foot due to type 2 diabetes mellitus (HCC)  Other abnormalities of gait and mobility  Rationale for Evaluation and Treatment: Rehabilitation  Wound Therapy - 03/04/23 0001     Subjective pt states she's finished with abx    Patient and Family  Stated Goals wound to heal    Date of Onset 07/05/22    Prior Treatments cream    Evaluation and Treatment Procedures Explained to Patient/Family Yes    Evaluation and Treatment Procedures agreed to    Wound Properties Date First Assessed: 10/04/22 Time First Assessed: 1345 Wound Type: Venous stasis ulcer Location: Pretibial Location Orientation: Distal;Right Wound Description (Comments): medial ankle wound superior Present on Admission: Yes   Dressing Type Gauze (Comment);Compression wrap    Dressing Changed Changed    Dressing Status Old drainage    Dressing Change Frequency PRN    Site / Wound Assessment Red    Peri-wound Assessment Edema;Erythema (blanchable)    Drainage Amount Minimal    Drainage Description Serous    Treatment Cleansed;Debridement (Selective)    Selective Debridement (non-excisional) - Location dry skin able to be removed via wash cloth and forceps    Selective Debridement (non-excisional) - Tissue Removed dry skin    Wound Therapy - Clinical Statement see below    Wound Therapy - Functional Problem List bathing, dressing    Factors Delaying/Impairing Wound Healing Altered sensation;Diabetes Mellitus;Vascular compromise;Multiple medical problems    Hydrotherapy Plan Debridement;Dressing change;Patient/family education    Wound Therapy - Frequency 2X / week    Wound Therapy - Current Recommendations PT    Wound Plan see below    Dressing  dry antifungal powder, . lymph netting, 1/2" foam, kerlix, cotton, coban, #5 netting.                 PATIENT EDUCATION: Education details: 01/01/23:  Contact MD and return due to wounds are not healing. 11/21/22, POC, changing dressing if soiled; EVAL Patient educated on exam findings, POC, scope of PT, HEP, and keeping dressing on unless soiled or panful. Person educated: Patient Education method: Explanation, Demonstration, and Handouts Education comprehension: verbalized understanding, returned demonstration, verbal  cues required, and tactile cues required   HOME EXERCISE PROGRAM: N/a   GOALS: Goals reviewed with patient? Yes  SHORT TERM GOALS: Target date: 10/25/2022    Wound to be free from slough to demonstrate healing.  Baseline: Goal status: MET   LONG TERM GOALS: Target date: 11/15/2022    Patient wound to be healed to reduce risk of infection. Baseline:  Goal status: IN PROGRESS  2.  Patient will state importance of compression garment use and be able to don to reduce risk of further wound development.  Baseline:  Goal status: IN PROGRESS   ASSESSMENT:  CLINICAL IMPRESSION:  Patient with continued peeling and raw skin but less than prior sessions. Continued with compression wrap following debridement of dry peeling skin. Patient will continue to benefit from PT to promote wound healing.   OBJECTIVE IMPAIRMENTS: Abnormal gait, decreased activity tolerance, decreased balance, decreased mobility, difficulty walking, impaired sensation, and improper body mechanics.   ACTIVITY LIMITATIONS: carrying, lifting, standing, squatting, stairs, transfers, locomotion level, and caring for others  PARTICIPATION LIMITATIONS: meal prep, cleaning, laundry, shopping, community activity, and yard work  PERSONAL FACTORS: 3+ comorbidities: DM, CKD, HLD, HTN  are also affecting patient's functional outcome.   REHAB POTENTIAL: Good  CLINICAL DECISION MAKING: Evolving/moderate complexity  EVALUATION COMPLEXITY: Moderate  PLAN: PT FREQUENCY: 2x/week  PT DURATION: 4 weeks  PLANNED INTERVENTIONS: Therapeutic exercises, Therapeutic activity, Neuromuscular re-education, Balance training, Gait training, Patient/Family education, Joint manipulation, Joint mobilization, Stair training, Orthotic/Fit training, DME instructions, Aquatic Therapy, Dry Needling, Electrical stimulation, Spinal manipulation, Spinal mobilization, Cryotherapy, Moist heat, Compression bandaging, scar mobilization, Splintting,  Taping, Traction, Ultrasound, Ionotophoresis 4mg /ml Dexamethasone, and Manual therapy   PLAN FOR NEXT SESSION: Continue wound care.  Follow up with antibiotic; change dressing if needed to promote healing environment.        Reola Mosher Joson Sapp, PT 03/04/2023, 3:15 PM

## 2023-03-06 DIAGNOSIS — N1832 Chronic kidney disease, stage 3b: Secondary | ICD-10-CM | POA: Diagnosis not present

## 2023-03-06 DIAGNOSIS — D631 Anemia in chronic kidney disease: Secondary | ICD-10-CM | POA: Diagnosis not present

## 2023-03-06 DIAGNOSIS — N189 Chronic kidney disease, unspecified: Secondary | ICD-10-CM | POA: Diagnosis not present

## 2023-03-06 DIAGNOSIS — R809 Proteinuria, unspecified: Secondary | ICD-10-CM | POA: Diagnosis not present

## 2023-03-06 DIAGNOSIS — Z79899 Other long term (current) drug therapy: Secondary | ICD-10-CM | POA: Diagnosis not present

## 2023-03-07 ENCOUNTER — Telehealth (HOSPITAL_COMMUNITY): Payer: Self-pay | Admitting: Physical Therapy

## 2023-03-07 ENCOUNTER — Ambulatory Visit (HOSPITAL_COMMUNITY): Payer: Medicare HMO | Admitting: Physical Therapy

## 2023-03-07 NOTE — Telephone Encounter (Signed)
Patient no show. Phoned patient at home and there was no answer. Called mobile number listed and spoke with patient's daughter to make her aware of missed appointment. She stated she was going to go look for her as patient knew she was supposed to come.   3:09 PM, 03/07/23 Wyman Songster PT, DPT Physical Therapist at [facility]

## 2023-03-10 DIAGNOSIS — E1165 Type 2 diabetes mellitus with hyperglycemia: Secondary | ICD-10-CM | POA: Diagnosis not present

## 2023-03-13 ENCOUNTER — Ambulatory Visit (HOSPITAL_COMMUNITY): Payer: Medicare HMO | Admitting: Physical Therapy

## 2023-03-13 DIAGNOSIS — N2581 Secondary hyperparathyroidism of renal origin: Secondary | ICD-10-CM | POA: Diagnosis not present

## 2023-03-13 DIAGNOSIS — R2689 Other abnormalities of gait and mobility: Secondary | ICD-10-CM | POA: Diagnosis not present

## 2023-03-13 DIAGNOSIS — E875 Hyperkalemia: Secondary | ICD-10-CM | POA: Diagnosis not present

## 2023-03-13 DIAGNOSIS — E11621 Type 2 diabetes mellitus with foot ulcer: Secondary | ICD-10-CM

## 2023-03-13 DIAGNOSIS — I5032 Chronic diastolic (congestive) heart failure: Secondary | ICD-10-CM | POA: Diagnosis not present

## 2023-03-13 DIAGNOSIS — L97519 Non-pressure chronic ulcer of other part of right foot with unspecified severity: Secondary | ICD-10-CM | POA: Diagnosis not present

## 2023-03-13 NOTE — Therapy (Signed)
OUTPATIENT PHYSICAL THERAPY Wound Treatment   Patient Name: Krista Payne MRN: [id number] DOB:1941/01/30, 82 y.o., female Today's Date: 03/13/2023   PCP: Benita Stabile, MD REFERRING PROVIDER: Benita Stabile, MD  END OF SESSION:   PT End of Session - 03/13/23 1509     Visit Number 30    Number of Visits 36    Date for PT Re-Evaluation 03/28/23    Authorization Type Primary: [employer] Medicare Secondary: [employer] (no auth, no VL)    Progress Note Due on Visit 36    PT Start Time 1515    PT Stop Time 1550    PT Time Calculation (min) 35 min    Activity Tolerance Patient tolerated treatment well    Behavior During Therapy WFL for tasks assessed/performed                       Past Medical History:  Diagnosis Date   Breast cancer (HCC)    R   Chronic kidney disease    Diabetes mellitus without complication (HCC)    Hyperlipidemia    Hypertension    Macular degeneration    Osteoarthritis    Past Surgical History:  Procedure Laterality Date   CATARACT EXTRACTION W/PHACO Right 02/07/2015   Procedure: CATARACT EXTRACTION PHACO AND INTRAOCULAR LENS PLACEMENT (IOC);  Surgeon: Gemma Payor, MD;  Location: [facility];  Service: Ophthalmology;  Laterality: Right;  CDE:12.72   CATARACT EXTRACTION W/PHACO Left 02/24/2015   Procedure: CATARACT EXTRACTION PHACO AND INTRAOCULAR LENS PLACEMENT LEFT EYE;  Surgeon: Gemma Payor, MD;  Location: [facility];  Service: Ophthalmology;  Laterality: Left;  CDE 11.69   COLONOSCOPY     Patient Active Problem List   Diagnosis Date Noted   Unspecified Escherichia coli (E. coli) as the cause of diseases classified elsewhere 02/23/2022   Sepsis (HCC) 02/22/2022   Acute kidney failure (HCC) 02/22/2022   Fall at home, initial encounter 02/22/2022   Personal history of COVID-19 02/02/2022   Pressure injury of skin 11/15/2021   Depression, unspecified 11/15/2021   Hypertensive heart and chronic kidney disease with heart failure and stage 1 through stage 4  chronic kidney disease, or unspecified chronic kidney disease (HCC) 11/15/2021   Insomnia 11/15/2021   Muscle weakness (generalized) 11/15/2021   Other abnormalities of gait and mobility 11/15/2021   Other forms of acute ischemic heart disease 11/15/2021   Traumatic ischemia of muscle (HCC) 11/15/2021   Diastolic heart failure (HCC) 11/15/2021   Diabetic foot infection (HCC) 11/14/2021   Acute metabolic encephalopathy 11/14/2021   Chronic kidney disease, stage 3b (HCC) 11/14/2021   Type 2 diabetes with nephropathy (HCC) 11/14/2021   Elevated troponin 11/13/2021   Lactic acidosis 11/13/2021   COVID-19 virus infection 11/13/2021   Hyperglycemia due to diabetes mellitus (HCC) 11/13/2021   Ankle wound, right, initial encounter 11/13/2021   Altered mental status 11/13/2021   Dehydration 11/13/2021   Long term (current) use of oral hypoglycemic drugs 10/29/2021   Unspecified macular degeneration 10/29/2021   Chronic kidney disease 05/17/2021   Personal history of malignant neoplasm of breast 10/29/2020   Malignant neoplasm of unspecified site of right female breast (HCC) 07/16/2019   URI (upper respiratory infection) 11/20/2011   Other seasonal allergic rhinitis 11/23/2010   Urinary tract infection without hematuria 11/23/2010   RENAL INSUFFICIENCY 10/26/2010   OSTEOARTHRITIS 10/18/2010   Osteoarthritis 10/18/2010   Vitamin D deficiency 08/10/2009   Hyperlipidemia 08/10/2009   Body mass index (BMI) 39.0-39.9, adult 08/10/2009  EDEMA 01/20/2008   Anemia in chronic kidney disease 06/19/2007   Hypertension 06/02/2007    ONSET DATE: 07/05/22  REFERRING DIAG: ulcer of right foot due to type 2 diabetes mellitus  THERAPY DIAG:  Ulcer of right foot due to type 2 diabetes mellitus (HCC)  Other abnormalities of gait and mobility  Rationale for Evaluation and Treatment: Rehabilitation  Wound Therapy - 03/13/23 1510     Subjective pt states she has not called MD about extending her  antibiotic.  Dressings are intact today.    Patient and Family Stated Goals wound to heal    Date of Onset 07/05/22    Prior Treatments cream    Evaluation and Treatment Procedures Explained to Patient/Family Yes    Evaluation and Treatment Procedures agreed to    Wound Properties Date First Assessed: 10/04/22 Time First Assessed: 1345 Wound Type: Venous stasis ulcer Location: Pretibial Location Orientation: Distal;Right Wound Description (Comments): medial ankle wound superior Present on Admission: Yes   Dressing Type Gauze (Comment);Compression wrap    Dressing Changed Changed    Dressing Status Old drainage    Dressing Change Frequency PRN    Site / Wound Assessment Red    Peri-wound Assessment Edema;Erythema (blanchable)    Drainage Amount Scant    Drainage Description Serous;Sanguineous    Treatment Cleansed;Debridement (Selective)    Selective Debridement (non-excisional) - Location dry skin able to be removed via wash cloth and forceps    Selective Debridement (non-excisional) - Tissue Removed dry skin    Wound Therapy - Clinical Statement see below    Wound Therapy - Functional Problem List bathing, dressing    Factors Delaying/Impairing Wound Healing Altered sensation;Diabetes Mellitus;Vascular compromise;Multiple medical problems    Hydrotherapy Plan Debridement;Dressing change;Patient/family education    Wound Therapy - Frequency 2X / week    Wound Therapy - Current Recommendations PT    Wound Plan see below    Dressing  antifungal powder, profore, 1/2" foam, #5 netting.                 PATIENT EDUCATION: Education details: 01/01/23:  Contact MD and return due to wounds are not healing. 11/21/22, POC, changing dressing if soiled; EVAL Patient educated on exam findings, POC, scope of PT, HEP, and keeping dressing on unless soiled or panful. Person educated: Patient Education method: Explanation, Demonstration, and Handouts Education comprehension: verbalized  understanding, returned demonstration, verbal cues required, and tactile cues required   HOME EXERCISE PROGRAM: N/a   GOALS: Goals reviewed with patient? Yes  SHORT TERM GOALS: Target date: 10/25/2022    Wound to be free from slough to demonstrate healing.  Baseline: Goal status: MET   LONG TERM GOALS: Target date: 11/15/2022    Patient wound to be healed to reduce risk of infection. Baseline:  Goal status: IN PROGRESS  2.  Patient will state importance of compression garment use and be able to don to reduce risk of further wound development.  Baseline:  Goal status: IN PROGRESS   ASSESSMENT:  CLINICAL IMPRESSION:  Wound continues to appear with less peeling and raw skin, however does have a slight odor today.  Unsure if this is exudate or if it is possibly soiled bandaging.  Instructed to call MD and see about getting antibiotic extended as wound is still not healed.  Cleansed well and removed most of peeling, dry skin.  Began profore wrap today along with foam.  Patient will continue to benefit from PT to promote wound healing.   OBJECTIVE  IMPAIRMENTS: Abnormal gait, decreased activity tolerance, decreased balance, decreased mobility, difficulty walking, impaired sensation, and improper body mechanics.   ACTIVITY LIMITATIONS: carrying, lifting, standing, squatting, stairs, transfers, locomotion level, and caring for others  PARTICIPATION LIMITATIONS: meal prep, cleaning, laundry, shopping, community activity, and yard work  PERSONAL FACTORS: 3+ comorbidities: DM, CKD, HLD, HTN  are also affecting patient's functional outcome.   REHAB POTENTIAL: Good  CLINICAL DECISION MAKING: Evolving/moderate complexity  EVALUATION COMPLEXITY: Moderate  PLAN: PT FREQUENCY: 2x/week  PT DURATION: 4 weeks  PLANNED INTERVENTIONS: Therapeutic exercises, Therapeutic activity, Neuromuscular re-education, Balance training, Gait training, Patient/Family education, Joint manipulation,  Joint mobilization, Stair training, Orthotic/Fit training, DME instructions, Aquatic Therapy, Dry Needling, Electrical stimulation, Spinal manipulation, Spinal mobilization, Cryotherapy, Moist heat, Compression bandaging, scar mobilization, Splintting, Taping, Traction, Ultrasound, Ionotophoresis 4mg /ml Dexamethasone, and Manual therapy   PLAN FOR NEXT SESSION: Continue wound care.  Follow up with antibiotic; change dressing if needed to promote healing environment.        Lurena Nida, PTA 03/13/2023, 4:37 PM

## 2023-03-15 ENCOUNTER — Encounter: Payer: Self-pay | Admitting: Nurse Practitioner

## 2023-03-15 ENCOUNTER — Ambulatory Visit (INDEPENDENT_AMBULATORY_CARE_PROVIDER_SITE_OTHER): Payer: Medicare HMO | Admitting: Nurse Practitioner

## 2023-03-15 VITALS — BP 120/70 | HR 58 | Ht 63.0 in | Wt 203.8 lb

## 2023-03-15 DIAGNOSIS — E1122 Type 2 diabetes mellitus with diabetic chronic kidney disease: Secondary | ICD-10-CM | POA: Diagnosis not present

## 2023-03-15 DIAGNOSIS — E785 Hyperlipidemia, unspecified: Secondary | ICD-10-CM

## 2023-03-15 DIAGNOSIS — I10 Essential (primary) hypertension: Secondary | ICD-10-CM | POA: Diagnosis not present

## 2023-03-15 DIAGNOSIS — Z7985 Long-term (current) use of injectable non-insulin antidiabetic drugs: Secondary | ICD-10-CM

## 2023-03-15 DIAGNOSIS — N184 Chronic kidney disease, stage 4 (severe): Secondary | ICD-10-CM | POA: Diagnosis not present

## 2023-03-15 DIAGNOSIS — Z794 Long term (current) use of insulin: Secondary | ICD-10-CM | POA: Diagnosis not present

## 2023-03-15 MED ORDER — TRULICITY 1.5 MG/0.5ML ~~LOC~~ SOAJ: 1.5000 mg | SUBCUTANEOUS | 3 refills | Status: DC

## 2023-03-15 MED ORDER — TRULICITY 0.75 MG/0.5ML ~~LOC~~ SOAJ: 0.7500 mg | SUBCUTANEOUS | 0 refills | Status: DC

## 2023-03-15 NOTE — Progress Notes (Signed)
Endocrinology Follow Up Note       03/15/2023, 11:18 AM   Subjective:    Patient ID: BRIHANY Krista Payne, female    DOB: [birth year].  Krista Payne is being seen in follow up after being seen in consultation for management of currently uncontrolled symptomatic diabetes requested by  Benita Stabile, MD.   Past Medical History:  Diagnosis Date   Breast cancer [facility])    R   Chronic kidney disease    Diabetes mellitus without complication (HCC)    Hyperlipidemia    Hypertension    Macular degeneration    Osteoarthritis     Past Surgical History:  Procedure Laterality Date   CATARACT EXTRACTION W/PHACO Right 02/07/2015   Procedure: CATARACT EXTRACTION PHACO AND INTRAOCULAR LENS PLACEMENT (IOC);  Surgeon: Gemma Payor, MD;  Location: [facility];  Service: Ophthalmology;  Laterality: Right;  CDE:12.72   CATARACT EXTRACTION W/PHACO Left 02/24/2015   Procedure: CATARACT EXTRACTION PHACO AND INTRAOCULAR LENS PLACEMENT LEFT EYE;  Surgeon: Gemma Payor, MD;  Location: [facility];  Service: Ophthalmology;  Laterality: Left;  CDE 11.69   COLONOSCOPY      Social History   Socioeconomic History   Marital status: Widowed    Spouse name: Not on file   Number of children: 3   Years of education: 54   Highest education level: 12th grade  Occupational History   Not on file  Tobacco Use   Smoking status: Never    Passive exposure: Never   Smokeless tobacco: Never  Vaping Use   Vaping Use: Never used  Substance and Sexual Activity   Alcohol use: No   Drug use: No   Sexual activity: Not Currently    Partners: Male  Other Topics Concern   Not on file  Social History Narrative   Not on file   Social Determinants of Health   Financial Resource Strain: Low Risk  (07/19/2022)   Overall Financial Resource Strain (CARDIA)    Difficulty of Paying Living Expenses: Not hard at all  Food Insecurity: No Food Insecurity (07/19/2022)   Hunger  Vital Sign    Worried About Running Out of Food in the Last Year: Never true    Ran Out of Food in the Last Year: Never true  Transportation Needs: No Transportation Needs (07/19/2022)   PRAPARE - Administrator, Civil Service (Medical): No    Lack of Transportation (Non-Medical): No  Physical Activity: Inactive (07/19/2022)   Exercise Vital Sign    Days of Exercise per Week: 0 days    Minutes of Exercise per Session: 0 min  Stress: No Stress Concern Present (07/19/2022)   [employer] of Occupational Health - Occupational Stress Questionnaire    Feeling of Stress : Not at all  Social Connections: Moderately Integrated (07/19/2022)   Social Connection and Isolation Panel [NHANES]    Frequency of Communication with Friends and Family: More than three times a week    Frequency of Social Gatherings with Friends and Family: More than three times a week    Attends Religious Services: More than 4 times per year    Active Member of [employer] or Organizations: Yes  Attends Banker Meetings: More than 4 times per year    Marital Status: Widowed    Family History  Problem Relation Age of Onset   Macular degeneration Mother    Diabetes Father    Heart disease Daughter     Outpatient Encounter Medications as of 03/15/2023  Medication Sig   amLODipine (NORVASC) 5 MG tablet Take 0.5 tablets (2.5 mg total) by mouth daily.   anastrozole (ARIMIDEX) 1 MG tablet Take 1 tablet (1 mg total) by mouth daily.   atenolol (TENORMIN) 25 MG tablet Take 25 mg by mouth daily.   buPROPion (WELLBUTRIN XL) 150 MG 24 hr tablet Take 300 mg by mouth daily as needed (feeling moody/depressed).   Dulaglutide (TRULICITY) 0.75 MG/0.5ML SOPN Inject 0.75 mg into the skin once a week.   Dulaglutide (TRULICITY) 1.5 MG/0.5ML SOPN Inject 1.5 mg into the skin once a week.   furosemide (LASIX) 20 MG tablet Take 10 mg by mouth daily as needed for fluid.   insulin glargine (LANTUS) 100 UNIT/ML injection  Inject 0.16 mLs (16 Units total) into the skin at bedtime.   Insulin Pen Needle (PEN NEEDLES) 31G X 6 MM MISC Use to inject insulin once daily   mupirocin ointment (BACTROBAN) 2 % SMARTSIG:sparingly Topical 3 Times Daily   olmesartan (BENICAR) 5 MG tablet Take 5 mg by mouth daily.   simvastatin (ZOCOR) 20 MG tablet Take 1 tablet (20 mg total) by mouth at bedtime.   No facility-administered encounter medications on file as of 03/15/2023.    ALLERGIES: No Known Allergies  VACCINATION STATUS: Immunization History  Administered Date(s) Administered   Influenza Split 11/13/2011   Influenza Whole 07/20/2008, 08/10/2009, 10/18/2010   Influenza-Unspecified 07/29/2013, 08/12/2020   Pneumococcal Polysaccharide-23 07/20/2008   Td 07/29/1998, 08/10/2009    Diabetes She presents for her follow-up diabetic visit. She has type 2 diabetes mellitus. Her disease course has been improving. There are no hypoglycemic associated symptoms. Associated symptoms include blurred vision, fatigue, polydipsia, polyuria and weight loss. There are no hypoglycemic complications. Symptoms are stable. Diabetic complications include nephropathy and peripheral neuropathy. Risk factors for coronary artery disease include diabetes mellitus, dyslipidemia, family history, hypertension, sedentary lifestyle, post-menopausal and obesity. Current diabetic treatment includes insulin injections. She is compliant with treatment most of the time. Her weight is decreasing steadily. She is following a generally unhealthy diet. When asked about meal planning, she reported none (gets meals on wheels). She has not had a previous visit with a dietitian. She participates in exercise intermittently. Her home blood glucose trend is decreasing steadily. Her breakfast blood glucose range is generally 70-90 mg/dl. Her lunch blood glucose range is generally >200 mg/dl. Her dinner blood glucose range is generally >200 mg/dl. Her bedtime blood glucose range  is generally >200 mg/dl. Her overall blood glucose range is >200 mg/dl. (She presents today with her CGM showing improved glycemic profile overall.  She was not due for another A1c today.  Analysis of her CGM shows TIR 39%, TAR 59%, TBR 2% with a GMI of 8.1%.  She notes she still struggles with her addiction to sweets.  Her fasting readings are tightening and postprandial readings still high.  Her daughters reported via mychart message that she does have help in the home now.) An ACE inhibitor/angiotensin II receptor blocker is being taken. She sees a podiatrist.Eye exam is current.  Hyperlipidemia This is a chronic problem. The current episode started more than 1 year ago. The problem is controlled. Recent lipid tests were  reviewed and are variable. Exacerbating diseases include chronic renal disease, diabetes and obesity. Factors aggravating her hyperlipidemia include beta blockers and fatty foods. Current antihyperlipidemic treatment includes statins. The current treatment provides moderate improvement of lipids. Compliance problems include adherence to diet and adherence to exercise.  Risk factors for coronary artery disease include diabetes mellitus, dyslipidemia, family history, hypertension, obesity, post-menopausal and a sedentary lifestyle.  Hypertension This is a chronic problem. The current episode started more than 1 year ago. The problem has been resolved since onset. The problem is controlled. Associated symptoms include blurred vision. There are no associated agents to hypertension. Risk factors for coronary artery disease include diabetes mellitus, dyslipidemia, family history, obesity and sedentary lifestyle. Past treatments include calcium channel blockers, angiotensin blockers and diuretics. There are no compliance problems.  Hypertensive end-organ damage includes kidney disease. Identifiable causes of hypertension include chronic renal disease.    Review of systems  Constitutional: +  steadily decreasing body weight,  current Body mass index is 36.1 kg/m. , + fatigue, no subjective hyperthermia, no subjective hypothermia Eyes: no blurry vision, no xerophthalmia ENT: no sore throat, no nodules palpated in throat, no dysphagia/odynophagia, no hoarseness Cardiovascular: no chest pain, no shortness of breath, no palpitations, no leg swelling Respiratory: no cough, no shortness of breath Gastrointestinal: no nausea/vomiting/diarrhea Genitourinary:+ polyuria Musculoskeletal: no muscle/joint aches- reports no falls since last visit Skin: no rashes, no hyperemia, has wound to right lower leg- seeing wound care twice weekly Neurological: no tremors, no numbness, no tingling, no dizziness Psychiatric: no depression, no anxiety   Objective:     BP 120/70 (BP Location: Left Arm, Patient Position: Sitting, Cuff Size: Normal)   Pulse (!) 58   Ht 5\' 3"  (1.6 m)   Wt 203 lb 12.8 oz (92.4 kg)   BMI 36.10 kg/m   Wt Readings from Last 3 Encounters:  03/15/23 203 lb 12.8 oz (92.4 kg)  02/13/23 204 lb 3.2 oz (92.6 kg)  12/12/22 212 lb 3.2 oz (96.3 kg)     BP Readings from Last 3 Encounters:  03/15/23 120/70  02/13/23 128/74  12/12/22 (!) 209/92      Physical Exam- Limited  Constitutional:  Body mass index is 36.1 kg/m. , not in acute distress, normal state of mind Eyes:  EOMI, no exophthalmos Musculoskeletal: no gross deformities, strength intact in all four extremities, no gross restriction of joint movements, reports no falls since last visit Skin:  no rashes, no hyperemia, compression wrap to right lower leg- sees wound care twice weekly Neurological: no tremor with outstretched hands   Diabetic Foot Exam - Simple   No data filed     CMP ( most recent) CMP     Component Value Date/Time   NA 135 12/04/2022 0945   NA 140 06/22/2022 0000   K 4.9 12/04/2022 0945   CL 103 12/04/2022 0945   CO2 22 12/04/2022 0945   GLUCOSE 193 (H) 12/04/2022 0945   BUN 39 (H)  12/04/2022 0945   BUN 39 (A) 06/22/2022 0000   CREATININE 1.46 (H) 12/04/2022 0945   CALCIUM 9.5 12/04/2022 0945   PROT 7.7 12/04/2022 0945   ALBUMIN 3.6 12/04/2022 0945   AST 16 12/04/2022 0945   ALT 11 12/04/2022 0945   ALKPHOS 79 12/04/2022 0945   BILITOT 0.7 12/04/2022 0945   GFRNONAA 36 (L) 12/04/2022 0945   GFRAA 43 (L) 06/01/2020 1259     Diabetic Labs (most recent): Lab Results  Component Value Date   HGBA1C 14.2 (  A) 02/13/2023   HGBA1C 8.4 11/14/2022   HGBA1C 8.9 06/21/2022     Lipid Panel ( most recent) Lipid Panel     Component Value Date/Time   CHOL 183 06/21/2022 0000   TRIG 166 (A) 06/21/2022 0000   HDL 52 06/21/2022 0000   CHOLHDL 3 02/20/2011 1051   VLDL 27.8 02/20/2011 1051   LDLCALC 102 06/21/2022 0000   LDLDIRECT 168.8 10/19/2008 0914      Lab Results  Component Value Date   TSH 1.07 02/20/2011   TSH 1.70 10/18/2010   TSH 1.49 08/10/2009   TSH 1.23 07/20/2008   TSH 1.39 06/19/2007           Assessment & Plan:   1) Type 2 diabetes mellitus with stage 4 chronic kidney disease, without long-term current use of insulin (HCC)  She presents today with her CGM showing improved glycemic profile overall.  She was not due for another A1c today.  Analysis of her CGM shows TIR 39%, TAR 59%, TBR 2% with a GMI of 8.1%.  She notes she still struggles with her addiction to sweets.  Her fasting readings are tightening and postprandial readings still high.  Her daughters reported via mychart message that she does have help in the home now and that she is going to wound care center twice weekly for wound care and dressing changes.  She admits she still consumes sweets but is trying hard to limit them.  - EIRENE LLERA has currently uncontrolled symptomatic type 2 DM since 82 years of age.   -Recent labs reviewed.  - I had a long discussion with her about the progressive nature of diabetes and the pathology behind its complications. -her diabetes is  complicated by CKD and she remains at a high risk for more acute and chronic complications which include CAD, CVA, CKD, retinopathy, and neuropathy. These are all discussed in detail with her.  - Nutritional counseling repeated at each appointment due to patients tendency to fall back in to old habits.  - The patient admits there is a room for improvement in their diet and drink choices. -  Suggestion is made for the patient to avoid simple carbohydrates from their diet including Cakes, Sweet Desserts / Pastries, Ice Cream, Soda (diet and regular), Sweet Tea, Candies, Chips, Cookies, Sweet Pastries, Store Bought Juices, Alcohol in Excess of 1-2 drinks a day, Artificial Sweeteners, Coffee Creamer, and "Sugar-free" Products. This will help patient to have stable blood glucose profile and potentially avoid unintended weight gain.   - I encouraged the patient to switch to unprocessed or minimally processed complex starch and increased protein intake (animal or plant source), fruits, and vegetables.   - Patient is advised to stick to a routine mealtimes to eat 3 meals a day and avoid unnecessary snacks (to snack only to correct hypoglycemia).  - I have approached her with the following individualized plan to manage her diabetes and patient agrees:   -She is advised to lower her Lantus to 25 units SQ nightly to avoid fasting hypoglycemia.  I discussed and initiated incretin therapy with Trulicity 0.75 mg SQ weekly x 1 month, then will increase to 1.5 mg SQ weekly thereafter (a more therapeutic dose).  This will hopefully help control her sweets cravings, while offering cardiovascular protection and control glucose.  We did talk about the potential side effects of this medication including nausea, vomiting, diarrhea, constipation but these symptoms are usually made worse by eating things high in fat and sugar.  I advised her to reach out to me if she experiences any of those side effects.  She does not have  personal or family history of thyroid cancer or pancreatitis, nor does she smoke, which makes her an excellent candidate for GLP1 therapy.  I did use demo pen to show her how the Trulicity injections work since it is a different pen than what she is used to.  -We also discussed that she should continue following up with wound care, that her uncontrolled diabetes is playing a role in her wound healing.  It is imperative we get better control of glucose ASAP to avoid complications from nonhealing wounds.  -she is encouraged to continue monitoring glucose at least twice daily, before breakfast and before bed (using her CGM), and to call the clinic if she has readings less than 70 or above 300 for 3 tests in a row.     - she is warned not to take insulin without proper monitoring per orders. - Adjustment parameters are given to her for hypo and hyperglycemia in writing.  - her Glipizide was discontinued, risk outweighs benefit for this patient given her CKD and advanced age, this medication increases her risk of severe hypoglycemia. - she is not a candidate for Metformin due to concurrent renal insufficiency.  - Specific targets for  A1c; LDL, HDL, and Triglycerides were discussed with the patient.  2) Blood Pressure /Hypertension:  her blood pressure is controlled to target today.   she is advised to continue her current medications including Norvasc 5 mg p.o. daily with breakfast, Atenolol 25 mg po daily, Lasix 10 mg po daily, and Benicar 5 mg po daily.  3) Lipids/Hyperlipidemia:    Review of her recent lipid panel from 02/25/23 showed controlled LDL at 56.  she is advised to continue Zocor 20 mg daily at bedtime.  Side effects and precautions discussed with her.  4)  Weight/Diet:  her Body mass index is 36.1 kg/m.  -  clearly complicating her diabetes care.   she is a candidate for weight loss. I discussed with her the fact that loss of 5 - 10% of her  current body weight will have the most  impact on her diabetes management.  Exercise, and detailed carbohydrates information provided  -  detailed on discharge instructions.  5) Chronic Care/Health Maintenance: -she is on ACEI/ARB and Statin medications and is encouraged to initiate and continue to follow up with Ophthalmology, Dentist, Podiatrist at least yearly or according to recommendations, and advised to stay away from smoking. I have recommended yearly flu vaccine and pneumonia vaccine at least every 5 years; moderate intensity exercise for up to 150 minutes weekly; and sleep for at least 7 hours a day.  - she is advised to maintain close follow up with Benita Stabile, MD for primary care needs, as well as her other providers for optimal and coordinated care.      I spent  47  minutes in the care of the patient today including review of labs from CMP, Lipids, Thyroid Function, Hematology (current and previous including abstractions from other facilities); face-to-face time discussing  her blood glucose readings/logs, discussing hypoglycemia and hyperglycemia episodes and symptoms, medications doses, her options of short and long term treatment based on the latest standards of care / guidelines;  discussion about incorporating lifestyle medicine;  and documenting the encounter. Risk reduction counseling performed per USPSTF guidelines to reduce obesity and cardiovascular risk factors.     Please refer to  Patient Instructions for Blood Glucose Monitoring and Insulin/Medications Dosing Guide"  in media tab for additional information. Please  also refer to " Patient Self Inventory" in the Media  tab for reviewed elements of pertinent patient history.  Julianne Handler participated in the discussions, expressed understanding, and voiced agreement with the above plans.  All questions were answered to her satisfaction. she is encouraged to contact clinic should she have any questions or concerns prior to her return visit.     Follow up  plan: - Return in about 3 months (around 06/15/2023) for Diabetes F/U with A1c in office, No previsit labs, Bring meter and logs.   Ronny Bacon, [facility] [address] [place], [place] [address] Phone: [phone] Fax: [phone]  03/15/2023, 11:18 AM

## 2023-03-18 ENCOUNTER — Ambulatory Visit (HOSPITAL_COMMUNITY): Payer: Medicare HMO | Admitting: Physical Therapy

## 2023-03-18 DIAGNOSIS — R21 Rash and other nonspecific skin eruption: Secondary | ICD-10-CM | POA: Diagnosis not present

## 2023-03-19 ENCOUNTER — Ambulatory Visit (HOSPITAL_COMMUNITY): Payer: Medicare HMO

## 2023-03-19 ENCOUNTER — Encounter (HOSPITAL_COMMUNITY): Payer: Self-pay

## 2023-03-19 ENCOUNTER — Encounter: Payer: Self-pay | Admitting: Nurse Practitioner

## 2023-03-19 DIAGNOSIS — E11621 Type 2 diabetes mellitus with foot ulcer: Secondary | ICD-10-CM

## 2023-03-19 DIAGNOSIS — R2689 Other abnormalities of gait and mobility: Secondary | ICD-10-CM | POA: Diagnosis not present

## 2023-03-19 DIAGNOSIS — L97519 Non-pressure chronic ulcer of other part of right foot with unspecified severity: Secondary | ICD-10-CM | POA: Diagnosis not present

## 2023-03-19 NOTE — Therapy (Signed)
OUTPATIENT PHYSICAL THERAPY Wound Treatment   Patient Name: Krista Payne MRN: [id number] DOB:11-27-1940, 82 y.o., female Today's Date: 03/19/2023   PCP: Benita Stabile, MD REFERRING PROVIDER: Benita Stabile, MD  END OF SESSION:   PT End of Session - 03/19/23 1249     Visit Number 31    Number of Visits 36    Date for PT Re-Evaluation 03/28/23    Authorization Type Primary: [employer] Medicare Secondary: Tricare (no auth, no VL)    Progress Note Due on Visit 36    PT Start Time 1135    PT Stop Time 1225    PT Time Calculation (min) 50 min    Activity Tolerance Patient tolerated treatment well    Behavior During Therapy WFL for tasks assessed/performed                        Past Medical History:  Diagnosis Date   Breast cancer (HCC)    R   Chronic kidney disease    Diabetes mellitus without complication (HCC)    Hyperlipidemia    Hypertension    Macular degeneration    Osteoarthritis    Past Surgical History:  Procedure Laterality Date   CATARACT EXTRACTION W/PHACO Right 02/07/2015   Procedure: CATARACT EXTRACTION PHACO AND INTRAOCULAR LENS PLACEMENT (IOC);  Surgeon: Gemma Payor, MD;  Location: [facility];  Service: Ophthalmology;  Laterality: Right;  CDE:12.72   CATARACT EXTRACTION W/PHACO Left 02/24/2015   Procedure: CATARACT EXTRACTION PHACO AND INTRAOCULAR LENS PLACEMENT LEFT EYE;  Surgeon: Gemma Payor, MD;  Location: [facility];  Service: Ophthalmology;  Laterality: Left;  CDE 11.69   COLONOSCOPY     Patient Active Problem List   Diagnosis Date Noted   Unspecified Escherichia coli (E. coli) as the cause of diseases classified elsewhere 02/23/2022   Sepsis (HCC) 02/22/2022   Acute kidney failure (HCC) 02/22/2022   Fall at home, initial encounter 02/22/2022   Personal history of COVID-19 02/02/2022   Pressure injury of skin 11/15/2021   Depression, unspecified 11/15/2021   Hypertensive heart and chronic kidney disease with heart failure and stage 1 through stage 4  chronic kidney disease, or unspecified chronic kidney disease (HCC) 11/15/2021   Insomnia 11/15/2021   Muscle weakness (generalized) 11/15/2021   Other abnormalities of gait and mobility 11/15/2021   Other forms of acute ischemic heart disease 11/15/2021   Traumatic ischemia of muscle (HCC) 11/15/2021   Diastolic heart failure (HCC) 11/15/2021   Diabetic foot infection (HCC) 11/14/2021   Acute metabolic encephalopathy 11/14/2021   Chronic kidney disease, stage 3b (HCC) 11/14/2021   Type 2 diabetes with nephropathy (HCC) 11/14/2021   Elevated troponin 11/13/2021   Lactic acidosis 11/13/2021   COVID-19 virus infection 11/13/2021   Hyperglycemia due to diabetes mellitus (HCC) 11/13/2021   Ankle wound, right, initial encounter 11/13/2021   Altered mental status 11/13/2021   Dehydration 11/13/2021   Long term (current) use of oral hypoglycemic drugs 10/29/2021   Unspecified macular degeneration 10/29/2021   Chronic kidney disease 05/17/2021   Personal history of malignant neoplasm of breast 10/29/2020   Malignant neoplasm of unspecified site of right female breast (HCC) 07/16/2019   URI (upper respiratory infection) 11/20/2011   Other seasonal allergic rhinitis 11/23/2010   Urinary tract infection without hematuria 11/23/2010   RENAL INSUFFICIENCY 10/26/2010   OSTEOARTHRITIS 10/18/2010   Osteoarthritis 10/18/2010   Vitamin D deficiency 08/10/2009   Hyperlipidemia 08/10/2009   Body mass index (BMI) 39.0-39.9, adult 08/10/2009  EDEMA 01/20/2008   Anemia in chronic kidney disease 06/19/2007   Hypertension 06/02/2007    ONSET DATE: 07/05/22  REFERRING DIAG: ulcer of right foot due to type 2 diabetes mellitus  THERAPY DIAG:  Ulcer of right foot due to type 2 diabetes mellitus (HCC)  Rationale for Evaluation and Treatment: Rehabilitation  Wound Therapy - 03/19/23 0001     Subjective Pt stated she saw MD yesterday but forgot to ask about antibiotics.  ARrived with dressings intact.   No reports of pain today.    Patient and Family Stated Goals wound to heal    Date of Onset 07/05/22    Prior Treatments cream    Pain Scale 0-10    Pain Score 0-No pain    Evaluation and Treatment Procedures Explained to Patient/Family Yes    Evaluation and Treatment Procedures agreed to    Wound Properties Date First Assessed: 10/04/22 Time First Assessed: 1345 Wound Type: Venous stasis ulcer Location: Pretibial Location Orientation: Distal;Right Wound Description (Comments): medial ankle wound superior Present on Admission: Yes   Wound Image Images linked: 1    Dressing Type Gauze (Comment);Compression wrap    Dressing Changed Changed    Dressing Status Old drainage    Dressing Change Frequency PRN    Site / Wound Assessment Red    Peri-wound Assessment Edema;Erythema (blanchable)    Drainage Amount Scant    Drainage Description Serous    Treatment Cleansed;Debridement (Selective)    Selective Debridement (non-excisional) - Location dry skin able to be removed via wash cloth and forceps    Selective Debridement (non-excisional) - Tools Used Forceps    Selective Debridement (non-excisional) - Tissue Removed dry skin    Wound Therapy - Clinical Statement see below    Wound Therapy - Functional Problem List bathing, dressing    Factors Delaying/Impairing Wound Healing Altered sensation;Diabetes Mellitus;Vascular compromise;Multiple medical problems    Hydrotherapy Plan Debridement;Dressing change;Patient/family education    Wound Therapy - Frequency 2X / week    Wound Therapy - Current Recommendations PT    Wound Plan see below    Dressing  antifungal powder, profore, 1/2" foam, #5 netting.                  PATIENT EDUCATION: Education details: 01/01/23:  Contact MD and return due to wounds are not healing. 11/21/22, POC, changing dressing if soiled; EVAL Patient educated on exam findings, POC, scope of PT, HEP, and keeping dressing on unless soiled or panful. Person  educated: Patient Education method: Explanation, Demonstration, and Handouts Education comprehension: verbalized understanding, returned demonstration, verbal cues required, and tactile cues required   HOME EXERCISE PROGRAM: N/a   GOALS: Goals reviewed with patient? Yes  SHORT TERM GOALS: Target date: 10/25/2022    Wound to be free from slough to demonstrate healing.  Baseline: Goal status: MET   LONG TERM GOALS: Target date: 11/15/2022    Patient wound to be healed to reduce risk of infection. Baseline:  Goal status: IN PROGRESS  2.  Patient will state importance of compression garment use and be able to don to reduce risk of further wound development.  Baseline:  Goal status: IN PROGRESS   ASSESSMENT:  CLINICAL IMPRESSION:  Dressings are wet, pt stated she was not aware.  Encouraged pt to remove and replace if/when dressings get wet.  Significant amount of dry skin removed from leg to promote healing.  Noted red and swollen toes, reminded pt to call MD regarding antibiotics as reports she  has not done yet.  Continued with antifungal powder and profore wrap to address edema with 1/2in foam and reports of comfort at EOS.    OBJECTIVE IMPAIRMENTS: Abnormal gait, decreased activity tolerance, decreased balance, decreased mobility, difficulty walking, impaired sensation, and improper body mechanics.   ACTIVITY LIMITATIONS: carrying, lifting, standing, squatting, stairs, transfers, locomotion level, and caring for others  PARTICIPATION LIMITATIONS: meal prep, cleaning, laundry, shopping, community activity, and yard work  PERSONAL FACTORS: 3+ comorbidities: DM, CKD, HLD, HTN  are also affecting patient's functional outcome.   REHAB POTENTIAL: Good  CLINICAL DECISION MAKING: Evolving/moderate complexity  EVALUATION COMPLEXITY: Moderate  PLAN: PT FREQUENCY: 2x/week  PT DURATION: 4 weeks  PLANNED INTERVENTIONS: Therapeutic exercises, Therapeutic activity, Neuromuscular  re-education, Balance training, Gait training, Patient/Family education, Joint manipulation, Joint mobilization, Stair training, Orthotic/Fit training, DME instructions, Aquatic Therapy, Dry Needling, Electrical stimulation, Spinal manipulation, Spinal mobilization, Cryotherapy, Moist heat, Compression bandaging, scar mobilization, Splintting, Taping, Traction, Ultrasound, Ionotophoresis 4mg /ml Dexamethasone, and Manual therapy   PLAN FOR NEXT SESSION: Continue wound care.  Follow up with antibiotic; change dressing if needed to promote healing environment.      Becky Sax, LPTA/CLT; CBIS [phone]  Juel Burrow, PTA 03/19/2023, 12:50 PM

## 2023-03-20 ENCOUNTER — Ambulatory Visit (HOSPITAL_COMMUNITY): Payer: Medicare HMO | Admitting: Physical Therapy

## 2023-03-20 DIAGNOSIS — I739 Peripheral vascular disease, unspecified: Secondary | ICD-10-CM | POA: Diagnosis not present

## 2023-03-20 DIAGNOSIS — M79671 Pain in right foot: Secondary | ICD-10-CM | POA: Diagnosis not present

## 2023-03-20 DIAGNOSIS — M79672 Pain in left foot: Secondary | ICD-10-CM | POA: Diagnosis not present

## 2023-03-20 DIAGNOSIS — M79674 Pain in right toe(s): Secondary | ICD-10-CM | POA: Diagnosis not present

## 2023-03-20 DIAGNOSIS — L110 Acquired keratosis follicularis: Secondary | ICD-10-CM | POA: Diagnosis not present

## 2023-03-20 DIAGNOSIS — M79675 Pain in left toe(s): Secondary | ICD-10-CM | POA: Diagnosis not present

## 2023-03-20 MED ORDER — TRULICITY 1.5 MG/0.5ML ~~LOC~~ SOAJ: 1.5000 mg | SUBCUTANEOUS | 3 refills | Status: DC

## 2023-03-21 ENCOUNTER — Other Ambulatory Visit: Payer: Self-pay

## 2023-03-21 MED ORDER — TRULICITY 0.75 MG/0.5ML ~~LOC~~ SOAJ: 0.7500 mg | SUBCUTANEOUS | 0 refills | Status: DC

## 2023-03-21 MED ORDER — TRULICITY 1.5 MG/0.5ML ~~LOC~~ SOAJ: 1.5000 mg | SUBCUTANEOUS | 3 refills | Status: DC

## 2023-03-27 ENCOUNTER — Ambulatory Visit (HOSPITAL_COMMUNITY): Payer: Medicare HMO | Admitting: Physical Therapy

## 2023-03-27 DIAGNOSIS — E11621 Type 2 diabetes mellitus with foot ulcer: Secondary | ICD-10-CM | POA: Diagnosis not present

## 2023-03-27 DIAGNOSIS — R2689 Other abnormalities of gait and mobility: Secondary | ICD-10-CM

## 2023-03-27 DIAGNOSIS — L97519 Non-pressure chronic ulcer of other part of right foot with unspecified severity: Secondary | ICD-10-CM | POA: Diagnosis not present

## 2023-03-27 NOTE — Therapy (Signed)
OUTPATIENT PHYSICAL THERAPY Wound Treatment   Patient Name: Krista Payne MRN: [id number] DOB:September 02, 1941, 82 y.o., female Today's Date: 03/27/2023   PCP: Benita Stabile, MD REFERRING PROVIDER: Benita Stabile, MD  END OF SESSION:   PT End of Session - 03/27/23 1623     Visit Number 32    Number of Visits 36    Date for PT Re-Evaluation 03/28/23    Authorization Type Primary: [employer] Medicare Secondary: Tricare (no auth, no VL)    Progress Note Due on Visit 36    PT Start Time 1520    PT Stop Time 1600    PT Time Calculation (min) 40 min    Activity Tolerance Patient tolerated treatment well    Behavior During Therapy WFL for tasks assessed/performed                        Past Medical History:  Diagnosis Date   Breast cancer (HCC)    R   Chronic kidney disease    Diabetes mellitus without complication (HCC)    Hyperlipidemia    Hypertension    Macular degeneration    Osteoarthritis    Past Surgical History:  Procedure Laterality Date   CATARACT EXTRACTION W/PHACO Right 02/07/2015   Procedure: CATARACT EXTRACTION PHACO AND INTRAOCULAR LENS PLACEMENT (IOC);  Surgeon: Gemma Payor, MD;  Location: [facility];  Service: Ophthalmology;  Laterality: Right;  CDE:12.72   CATARACT EXTRACTION W/PHACO Left 02/24/2015   Procedure: CATARACT EXTRACTION PHACO AND INTRAOCULAR LENS PLACEMENT LEFT EYE;  Surgeon: Gemma Payor, MD;  Location: [facility];  Service: Ophthalmology;  Laterality: Left;  CDE 11.69   COLONOSCOPY     Patient Active Problem List   Diagnosis Date Noted   Unspecified Escherichia coli (E. coli) as the cause of diseases classified elsewhere 02/23/2022   Sepsis (HCC) 02/22/2022   Acute kidney failure (HCC) 02/22/2022   Fall at home, initial encounter 02/22/2022   Personal history of COVID-19 02/02/2022   Pressure injury of skin 11/15/2021   Depression, unspecified 11/15/2021   Hypertensive heart and chronic kidney disease with heart failure and stage 1 through stage 4  chronic kidney disease, or unspecified chronic kidney disease (HCC) 11/15/2021   Insomnia 11/15/2021   Muscle weakness (generalized) 11/15/2021   Other abnormalities of gait and mobility 11/15/2021   Other forms of acute ischemic heart disease 11/15/2021   Traumatic ischemia of muscle (HCC) 11/15/2021   Diastolic heart failure (HCC) 11/15/2021   Diabetic foot infection (HCC) 11/14/2021   Acute metabolic encephalopathy 11/14/2021   Chronic kidney disease, stage 3b (HCC) 11/14/2021   Type 2 diabetes with nephropathy (HCC) 11/14/2021   Elevated troponin 11/13/2021   Lactic acidosis 11/13/2021   COVID-19 virus infection 11/13/2021   Hyperglycemia due to diabetes mellitus (HCC) 11/13/2021   Ankle wound, right, initial encounter 11/13/2021   Altered mental status 11/13/2021   Dehydration 11/13/2021   Long term (current) use of oral hypoglycemic drugs 10/29/2021   Unspecified macular degeneration 10/29/2021   Chronic kidney disease 05/17/2021   Personal history of malignant neoplasm of breast 10/29/2020   Malignant neoplasm of unspecified site of right female breast (HCC) 07/16/2019   URI (upper respiratory infection) 11/20/2011   Other seasonal allergic rhinitis 11/23/2010   Urinary tract infection without hematuria 11/23/2010   RENAL INSUFFICIENCY 10/26/2010   OSTEOARTHRITIS 10/18/2010   Osteoarthritis 10/18/2010   Vitamin D deficiency 08/10/2009   Hyperlipidemia 08/10/2009   Body mass index (BMI) 39.0-39.9, adult 08/10/2009  EDEMA 01/20/2008   Anemia in chronic kidney disease 06/19/2007   Hypertension 06/02/2007    ONSET DATE: 07/05/22  REFERRING DIAG: ulcer of right foot due to type 2 diabetes mellitus  THERAPY DIAG:  Ulcer of right foot due to type 2 diabetes mellitus (HCC)  Other abnormalities of gait and mobility  Rationale for Evaluation and Treatment: Rehabilitation  Wound Therapy - 03/27/23 0001     Subjective pt states she returns to MD tomorrow.  Has not received  any antibiotics and has not applied lotion to her foot or Lt LE.    Patient and Family Stated Goals wound to heal    Date of Onset 07/05/22    Prior Treatments cream    Pain Scale 0-10    Pain Score 0-No pain    Evaluation and Treatment Procedures Explained to Patient/Family Yes    Evaluation and Treatment Procedures agreed to    Wound Properties Date First Assessed: 10/04/22 Time First Assessed: 1345 Wound Type: Venous stasis ulcer Location: Pretibial Location Orientation: Distal;Right Wound Description (Comments): medial ankle wound superior Present on Admission: Yes   Wound Image Images linked: 1    Dressing Type Gauze (Comment);Compression wrap    Dressing Changed Changed    Dressing Status Old drainage    Dressing Change Frequency PRN    Site / Wound Assessment Red;Dry;Pink    Peri-wound Assessment Edema;Erythema (blanchable)    Drainage Amount Scant    Drainage Description Serous    Treatment Cleansed;Debridement (Selective)    Selective Debridement (non-excisional) - Location dry skin able to be removed via wash cloth and forceps    Selective Debridement (non-excisional) - Tools Used Forceps    Selective Debridement (non-excisional) - Tissue Removed dry skin    Wound Therapy - Clinical Statement see below    Wound Therapy - Functional Problem List bathing, dressing    Factors Delaying/Impairing Wound Healing Altered sensation;Diabetes Mellitus;Vascular compromise;Multiple medical problems    Hydrotherapy Plan Debridement;Dressing change;Patient/family education    Wound Therapy - Frequency 2X / week    Wound Therapy - Current Recommendations PT    Wound Plan see below    Dressing  lotion,antifungal powder,vaseline, profore,  #5 netting.                  PATIENT EDUCATION: Education details: 01/01/23:  Contact MD and return due to wounds are not healing. 11/21/22, POC, changing dressing if soiled; EVAL Patient educated on exam findings, POC, scope of PT, HEP, and  keeping dressing on unless soiled or panful. Person educated: Patient Education method: Explanation, Demonstration, and Handouts Education comprehension: verbalized understanding, returned demonstration, verbal cues required, and tactile cues required   HOME EXERCISE PROGRAM: N/a   GOALS: Goals reviewed with patient? Yes  SHORT TERM GOALS: Target date: 10/25/2022    Wound to be free from slough to demonstrate healing.  Baseline: Goal status: MET   LONG TERM GOALS: Target date: 11/15/2022    Patient wound to be healed to reduce risk of infection. Baseline:  Goal status: IN PROGRESS  2.  Patient will state importance of compression garment use and be able to don to reduce risk of further wound development.  Baseline:  Goal status: IN PROGRESS   ASSESSMENT:  CLINICAL IMPRESSION:  Mixed presentation of dryness, raw tissue and some drainage.  Bottom and top of dressing wet, possibly from bathing.  Continues to present with significant amount of dry skin at each visit that is debrided in order to moisturize skin beneath.  Pt admits to not applying lotion to exposed skin or attempting to wear compression on Lt LE.  Noted edema and blotchiest of Lt LE.  Encouraged to don compression in AM to help with this.  States she is returning to MD tomorrow and will look at her LE.  Did not reapply foam this session as it was soiled and has MD appt tomorrow.   Massaged with lotion until absorbed. Sprinkled on antifungal then applied vaseline.  Continued with profore.    OBJECTIVE IMPAIRMENTS: Abnormal gait, decreased activity tolerance, decreased balance, decreased mobility, difficulty walking, impaired sensation, and improper body mechanics.   ACTIVITY LIMITATIONS: carrying, lifting, standing, squatting, stairs, transfers, locomotion level, and caring for others  PARTICIPATION LIMITATIONS: meal prep, cleaning, laundry, shopping, community activity, and yard work  PERSONAL FACTORS: 3+  comorbidities: DM, CKD, HLD, HTN  are also affecting patient's functional outcome.   REHAB POTENTIAL: Good  CLINICAL DECISION MAKING: Evolving/moderate complexity  EVALUATION COMPLEXITY: Moderate  PLAN: PT FREQUENCY: 2x/week  PT DURATION: 4 weeks  PLANNED INTERVENTIONS: Therapeutic exercises, Therapeutic activity, Neuromuscular re-education, Balance training, Gait training, Patient/Family education, Joint manipulation, Joint mobilization, Stair training, Orthotic/Fit training, DME instructions, Aquatic Therapy, Dry Needling, Electrical stimulation, Spinal manipulation, Spinal mobilization, Cryotherapy, Moist heat, Compression bandaging, scar mobilization, Splintting, Taping, Traction, Ultrasound, Ionotophoresis 4mg /ml Dexamethasone, and Manual therapy   PLAN FOR NEXT SESSION: Follow up with MD appt with any futher suggestions. Follow up with antibiotic; change dressing if needed to promote healing environment.     Lurena Nida, PTA 03/27/2023, 4:35 PM

## 2023-03-28 DIAGNOSIS — Z713 Dietary counseling and surveillance: Secondary | ICD-10-CM | POA: Diagnosis not present

## 2023-03-28 DIAGNOSIS — Z6835 Body mass index (BMI) 35.0-35.9, adult: Secondary | ICD-10-CM | POA: Diagnosis not present

## 2023-03-28 DIAGNOSIS — N189 Chronic kidney disease, unspecified: Secondary | ICD-10-CM | POA: Diagnosis not present

## 2023-03-28 DIAGNOSIS — L0390 Cellulitis, unspecified: Secondary | ICD-10-CM | POA: Diagnosis not present

## 2023-03-28 DIAGNOSIS — Z7182 Exercise counseling: Secondary | ICD-10-CM | POA: Diagnosis not present

## 2023-03-28 DIAGNOSIS — R296 Repeated falls: Secondary | ICD-10-CM | POA: Diagnosis not present

## 2023-03-28 DIAGNOSIS — R21 Rash and other nonspecific skin eruption: Secondary | ICD-10-CM | POA: Diagnosis not present

## 2023-03-28 DIAGNOSIS — L03115 Cellulitis of right lower limb: Secondary | ICD-10-CM | POA: Diagnosis not present

## 2023-03-28 DIAGNOSIS — I10 Essential (primary) hypertension: Secondary | ICD-10-CM | POA: Diagnosis not present

## 2023-03-28 DIAGNOSIS — R600 Localized edema: Secondary | ICD-10-CM | POA: Diagnosis not present

## 2023-03-28 DIAGNOSIS — E1122 Type 2 diabetes mellitus with diabetic chronic kidney disease: Secondary | ICD-10-CM | POA: Diagnosis not present

## 2023-03-29 ENCOUNTER — Ambulatory Visit (HOSPITAL_COMMUNITY): Payer: Medicare HMO | Admitting: Physical Therapy

## 2023-03-29 DIAGNOSIS — L97519 Non-pressure chronic ulcer of other part of right foot with unspecified severity: Secondary | ICD-10-CM | POA: Diagnosis not present

## 2023-03-29 DIAGNOSIS — R2689 Other abnormalities of gait and mobility: Secondary | ICD-10-CM | POA: Diagnosis not present

## 2023-03-29 DIAGNOSIS — E11621 Type 2 diabetes mellitus with foot ulcer: Secondary | ICD-10-CM

## 2023-03-29 NOTE — Addendum Note (Signed)
Addended by: Bella Kennedy on: 03/29/2023 11:51 AM   Modules accepted: Orders

## 2023-03-29 NOTE — Therapy (Addendum)
OUTPATIENT PHYSICAL THERAPY Wound Treatment   Patient Name: Krista Payne MRN: [id number] DOB:09/15/1941, 82 y.o., female Today's Date: 03/29/2023 Progress Note Reporting Period 02/12/23 to 03/29/23  See note below for Objective Data and Assessment of Progress/Goals.      PCP: Krista Stabile, MD REFERRING PROVIDER: Benita Stabile, MD  END OF SESSION:   PT End of Session - 03/29/23 1130     Visit Number 33    Number of Visits 37    Date for PT Re-Evaluation 04/28/31    Authorization Type Primary: [employer] Medicare Secondary: Tricare (no auth, no VL)    Progress Note Due on Visit 37    PT Start Time 1100    PT Stop Time 1130    PT Time Calculation (min) 30 min    Activity Tolerance Patient tolerated treatment well    Behavior During Therapy WFL for tasks assessed/performed                        Past Medical History:  Diagnosis Date   Breast cancer (HCC)    R   Chronic kidney disease    Diabetes mellitus without complication (HCC)    Hyperlipidemia    Hypertension    Macular degeneration    Osteoarthritis    Past Surgical History:  Procedure Laterality Date   CATARACT EXTRACTION W/PHACO Right 02/07/2015   Procedure: CATARACT EXTRACTION PHACO AND INTRAOCULAR LENS PLACEMENT (IOC);  Surgeon: Krista Payor, MD;  Location: [facility];  Service: Ophthalmology;  Laterality: Right;  CDE:12.72   CATARACT EXTRACTION W/PHACO Left 02/24/2015   Procedure: CATARACT EXTRACTION PHACO AND INTRAOCULAR LENS PLACEMENT LEFT EYE;  Surgeon: Krista Payor, MD;  Location: [facility];  Service: Ophthalmology;  Laterality: Left;  CDE 11.69   COLONOSCOPY     Patient Active Problem List   Diagnosis Date Noted   Unspecified Escherichia coli (E. coli) as the cause of diseases classified elsewhere 02/23/2022   Sepsis (HCC) 02/22/2022   Acute kidney failure (HCC) 02/22/2022   Fall at home, initial encounter 02/22/2022   Personal history of COVID-19 02/02/2022   Pressure injury of skin 11/15/2021    Depression, unspecified 11/15/2021   Hypertensive heart and chronic kidney disease with heart failure and stage 1 through stage 4 chronic kidney disease, or unspecified chronic kidney disease (HCC) 11/15/2021   Insomnia 11/15/2021   Muscle weakness (generalized) 11/15/2021   Other abnormalities of gait and mobility 11/15/2021   Other forms of acute ischemic heart disease 11/15/2021   Traumatic ischemia of muscle (HCC) 11/15/2021   Diastolic heart failure (HCC) 11/15/2021   Diabetic foot infection (HCC) 11/14/2021   Acute metabolic encephalopathy 11/14/2021   Chronic kidney disease, stage 3b (HCC) 11/14/2021   Type 2 diabetes with nephropathy (HCC) 11/14/2021   Elevated troponin 11/13/2021   Lactic acidosis 11/13/2021   COVID-19 virus infection 11/13/2021   Hyperglycemia due to diabetes mellitus (HCC) 11/13/2021   Ankle wound, right, initial encounter 11/13/2021   Altered mental status 11/13/2021   Dehydration 11/13/2021   Long term (current) use of oral hypoglycemic drugs 10/29/2021   Unspecified macular degeneration 10/29/2021   Chronic kidney disease 05/17/2021   Personal history of malignant neoplasm of breast 10/29/2020   Malignant neoplasm of unspecified site of right female breast (HCC) 07/16/2019   URI (upper respiratory infection) 11/20/2011   Other seasonal allergic rhinitis 11/23/2010   Urinary tract infection without hematuria 11/23/2010   RENAL INSUFFICIENCY 10/26/2010   OSTEOARTHRITIS 10/18/2010  Osteoarthritis 10/18/2010   Vitamin D deficiency 08/10/2009   Hyperlipidemia 08/10/2009   Body mass index (BMI) 39.0-39.9, adult 08/10/2009   EDEMA 01/20/2008   Anemia in chronic kidney disease 06/19/2007   Hypertension 06/02/2007    ONSET DATE: 07/05/22  REFERRING DIAG: ulcer of right foot due to type 2 diabetes mellitus  THERAPY DIAG:  Ulcer of right foot due to type 2 diabetes mellitus (HCC)  Other abnormalities of gait and mobility  Rationale for Evaluation and  Treatment: Rehabilitation  Wound Therapy - 03/29/23 0001     Subjective pt states she went to  MD and has new prescriptions.     Patient and Family Stated Goals wound to heal    Date of Onset 07/05/22    Prior Treatments cream/antibiotic    Pain Scale 0-10    Pain Score 0-No pain    Evaluation and Treatment Procedures Explained to Patient/Family Yes    Evaluation and Treatment Procedures agreed to    Wound Properties Date First Assessed: 10/04/22 Time First Assessed: 1345 Wound Type: Venous stasis ulcer Location: Pretibial Location Orientation: Distal;Right Wound Description (Comments): medial ankle wound superior Present on Admission: Yes   Dressing Type Gauze (Comment);Compression wrap    Dressing Changed Changed    Dressing Status Old drainage    Dressing Change Frequency PRN    Site / Wound Assessment Red;Dry;Pink    Peri-wound Assessment Erythema (blanchable)   did have swelling but compression dressing has controlled this.   Drainage Amount Scant   was moderate   Drainage Description Serous    Treatment Cleansed   no sharps debridement needed.  PT has dry skin thoroughout LE which is able to be removed with washcloth.   Selective Debridement (non-excisional) - Location dry skin able to be removed via wash cloth and forceps    Selective Debridement (non-excisional) - Tools Used Forceps    Selective Debridement (non-excisional) - Tissue Removed dry skin    Wound Therapy - Clinical Statement see below    Wound Therapy - Functional Problem List bathing, dressing    Factors Delaying/Impairing Wound Healing Altered sensation;Diabetes Mellitus;Vascular compromise;Multiple medical problems    Hydrotherapy Plan Dressing change;Patient/family education    Wound Therapy - Frequency --   1 x a week   Wound Therapy - Current Recommendations PT    Wound Plan see below    Dressing  nystatin, 4x4 kerlix and netting             Manual completed for improved circulation/edema  PATIENT  EDUCATION: Education details: 01/01/23:  Contact MD and return due to wounds are not healing. 11/21/22, POC, changing dressing if soiled; EVAL Patient educated on exam findings, POC, scope of PT, HEP, and keeping dressing on unless soiled or panful. Person educated: Patient Education method: Explanation, Demonstration, and Handouts Education comprehension: verbalized understanding, returned demonstration, verbal cues required, and tactile cues required   HOME EXERCISE PROGRAM: N/a   GOALS: Goals reviewed with patient? Yes  SHORT TERM GOALS: Target date: 10/25/2022    Wound to be free from slough to demonstrate healing.  Baseline: Goal status: MET   LONG TERM GOALS: Target date: 11/15/2022    Patient wound to be healed to reduce risk of infection. Baseline:  Goal status: IN PROGRESS  2.  Patient will state importance of compression garment use and be able to don to reduce risk of further wound development.  Baseline:  Goal status: IN PROGRESS   ASSESSMENT:  CLINICAL IMPRESSION: MD called evaluating therapist  on 03/28/23 following MD appointment.  MD has ordered nystatin as well as a new antibiotic.  MD also requested not to put compression dressing but rather just kerlix since pt wound is not improving. MD is also going to refer to a dermatologist.   Pt  continues to have a mix of dryness, raw tissue and some drainage throughout her whole leg.  Therapist changed dressing as MD requested, educated pt  that if she notes that the dressing is wet she is to remove, cleanse her leg apply a liberal amount of nystatin to her entire leg and bandage with a gauze bandage.  PT vocalized understanding.  Therapist explained that we are going to decrease to one time a week to monitor new treatment.  Therapist explained that MD does not want compression at this time to see how her LE does with a gauze bandage.     OBJECTIVE IMPAIRMENTS: Abnormal gait, decreased activity tolerance, decreased balance,  decreased mobility, difficulty walking, impaired sensation, and improper body mechanics.   ACTIVITY LIMITATIONS: carrying, lifting, standing, squatting, stairs, transfers, locomotion level, and caring for others  PARTICIPATION LIMITATIONS: meal prep, cleaning, laundry, shopping, community activity, and yard work  PERSONAL FACTORS: 3+ comorbidities: DM, CKD, HLD, HTN  are also affecting patient's functional outcome.   REHAB POTENTIAL: Good  CLINICAL DECISION MAKING: Evolving/moderate complexity  EVALUATION COMPLEXITY: Moderate  PLAN: PT FREQUENCY: 1 x per week   PT DURATION: 4 weeks or until pt has dermatologist appointment.  If pt is returning to dermatologist on a regular basis pt may be discharged.   PLANNED INTERVENTIONS: Therapeutic exercises, Therapeutic activity, Neuromuscular re-education, Balance training, Gait training, Patient/Family education, Joint manipulation, Joint mobilization, Stair training, Orthotic/Fit training, DME instructions, Aquatic Therapy, Dry Needling, Electrical stimulation, Spinal manipulation, Spinal mobilization, Cryotherapy, Moist heat, Compression bandaging, scar mobilization, Splintting, Taping, Traction, Ultrasound, Ionotophoresis 4mg /ml Dexamethasone, and Manual therapy   PLAN FOR NEXT SESSION: Assess how new antibiotic and nystatin creme does to improve skin integrity.   Virgina Organ, PT CLT [phone]  03/29/2023, 11:48 AM

## 2023-04-01 ENCOUNTER — Ambulatory Visit (HOSPITAL_COMMUNITY): Payer: Medicare HMO | Attending: Internal Medicine | Admitting: Physical Therapy

## 2023-04-01 DIAGNOSIS — H353231 Exudative age-related macular degeneration, bilateral, with active choroidal neovascularization: Secondary | ICD-10-CM | POA: Diagnosis not present

## 2023-04-01 DIAGNOSIS — H35033 Hypertensive retinopathy, bilateral: Secondary | ICD-10-CM | POA: Diagnosis not present

## 2023-04-01 DIAGNOSIS — E11621 Type 2 diabetes mellitus with foot ulcer: Secondary | ICD-10-CM | POA: Diagnosis not present

## 2023-04-01 DIAGNOSIS — H43813 Vitreous degeneration, bilateral: Secondary | ICD-10-CM | POA: Diagnosis not present

## 2023-04-01 DIAGNOSIS — L97519 Non-pressure chronic ulcer of other part of right foot with unspecified severity: Secondary | ICD-10-CM | POA: Insufficient documentation

## 2023-04-01 DIAGNOSIS — H43391 Other vitreous opacities, right eye: Secondary | ICD-10-CM | POA: Diagnosis not present

## 2023-04-01 DIAGNOSIS — H348322 Tributary (branch) retinal vein occlusion, left eye, stable: Secondary | ICD-10-CM | POA: Diagnosis not present

## 2023-04-01 DIAGNOSIS — R2689 Other abnormalities of gait and mobility: Secondary | ICD-10-CM | POA: Diagnosis not present

## 2023-04-01 NOTE — Therapy (Signed)
OUTPATIENT PHYSICAL THERAPY Wound Treatment   Patient Name: Krista Payne MRN: [id number] DOB:Sep 20, 1941, 82 y.o., female Today's Date: 04/01/2023    PCP: Benita Stabile, MD REFERRING PROVIDER: Benita Stabile, MD  END OF SESSION:   PT End of Session - 04/01/23 1015    Visit Number 34    Number of Visits 37    Date for PT Re-Evaluation 04/28/31    Authorization Type Primary: [employer] Medicare Secondary: Tricare (no auth, no VL)    Progress Note Due on Visit 37    PT Start Time 0945    PT Stop Time 1015    PT Time Calculation (min) 30 min    Activity Tolerance Patient tolerated treatment well    Behavior During Therapy WFL for tasks assessed/performed              Past Medical History:  Diagnosis Date   Breast cancer (HCC)    R   Chronic kidney disease    Diabetes mellitus without complication (HCC)    Hyperlipidemia    Hypertension    Macular degeneration    Osteoarthritis    Past Surgical History:  Procedure Laterality Date   CATARACT EXTRACTION W/PHACO Right 02/07/2015   Procedure: CATARACT EXTRACTION PHACO AND INTRAOCULAR LENS PLACEMENT (IOC);  Surgeon: Gemma Payor, MD;  Location: [facility];  Service: Ophthalmology;  Laterality: Right;  CDE:12.72   CATARACT EXTRACTION W/PHACO Left 02/24/2015   Procedure: CATARACT EXTRACTION PHACO AND INTRAOCULAR LENS PLACEMENT LEFT EYE;  Surgeon: Gemma Payor, MD;  Location: [facility];  Service: Ophthalmology;  Laterality: Left;  CDE 11.69   COLONOSCOPY     Patient Active Problem List   Diagnosis Date Noted   Unspecified Escherichia coli (E. coli) as the cause of diseases classified elsewhere 02/23/2022   Sepsis (HCC) 02/22/2022   Acute kidney failure (HCC) 02/22/2022   Fall at home, initial encounter 02/22/2022   Personal history of COVID-19 02/02/2022   Pressure injury of skin 11/15/2021   Depression, unspecified 11/15/2021   Hypertensive heart and chronic kidney disease with heart failure and stage 1 through stage 4 chronic kidney  disease, or unspecified chronic kidney disease (HCC) 11/15/2021   Insomnia 11/15/2021   Muscle weakness (generalized) 11/15/2021   Other abnormalities of gait and mobility 11/15/2021   Other forms of acute ischemic heart disease 11/15/2021   Traumatic ischemia of muscle (HCC) 11/15/2021   Diastolic heart failure (HCC) 11/15/2021   Diabetic foot infection (HCC) 11/14/2021   Acute metabolic encephalopathy 11/14/2021   Chronic kidney disease, stage 3b (HCC) 11/14/2021   Type 2 diabetes with nephropathy (HCC) 11/14/2021   Elevated troponin 11/13/2021   Lactic acidosis 11/13/2021   COVID-19 virus infection 11/13/2021   Hyperglycemia due to diabetes mellitus (HCC) 11/13/2021   Ankle wound, right, initial encounter 11/13/2021   Altered mental status 11/13/2021   Dehydration 11/13/2021   Long term (current) use of oral hypoglycemic drugs 10/29/2021   Unspecified macular degeneration 10/29/2021   Chronic kidney disease 05/17/2021   Personal history of malignant neoplasm of breast 10/29/2020   Malignant neoplasm of unspecified site of right female breast (HCC) 07/16/2019   URI (upper respiratory infection) 11/20/2011   Other seasonal allergic rhinitis 11/23/2010   Urinary tract infection without hematuria 11/23/2010   RENAL INSUFFICIENCY 10/26/2010   OSTEOARTHRITIS 10/18/2010   Osteoarthritis 10/18/2010   Vitamin D deficiency 08/10/2009   Hyperlipidemia 08/10/2009   Body mass index (BMI) 39.0-39.9, adult 08/10/2009   EDEMA 01/20/2008   Anemia in chronic kidney  disease 06/19/2007   Hypertension 06/02/2007    ONSET DATE: 07/05/22  REFERRING DIAG: ulcer of right foot due to type 2 diabetes mellitus  THERAPY DIAG:  Ulcer of right foot due to type 2 diabetes mellitus (HCC)  Other abnormalities of gait and mobility  Rationale for Evaluation and Treatment: Rehabilitation   04/01/23 0001  Subjective Assessment  Subjective Pt states that she scratched her leg due to it being itchy.   States that she had  no problem with the new antibiotic.  Patient and Family Stated Goals wound to heal  Date of Onset 07/05/22  Prior Treatments cream/antibiotyic  Pain Assessment  Pain Scale 0-10  Evaluation and Treatment  Evaluation and Treatment Procedures Explained to Patient/Family Yes  Evaluation and Treatment Procedures agreed to  Wound / Incision (Open or Dehisced) 10/04/22 Venous stasis ulcer Pretibial Distal;Right medial ankle wound superior  Date First Assessed/Time First Assessed: 10/04/22 1345   Wound Type: Venous stasis ulcer  Location: Pretibial  Location Orientation: Distal;Right  Wound Description (Comments): medial ankle wound superior  Present on Admission: Yes  Dressing Type Gauze (Comment);Compression wrap  Dressing Changed Changed  Dressing Status Old drainage  Dressing Change Frequency PRN  Site / Wound Assessment Red;Dry;Pink  Peri-wound Assessment Erythema (blanchable);Edema  Drainage Amount Scant  Drainage Description Sanguineous (from scratching .)  Selective Debridement (non-excisional)  Selective Debridement (non-excisional) - Location dry skin able to be removed via wash cloth and forceps  Selective Debridement (non-excisional) - Tools Used Forceps  Selective Debridement (non-excisional) - Tissue Removed dry skin  Wound Therapy - Assess/Plan/Recommendations  Wound Therapy - Clinical Statement see below  Wound Therapy - Functional Problem List bathing, dressing  Factors Delaying/Impairing Wound Healing Altered sensation;Diabetes Mellitus;Vascular compromise;Multiple medical problems  Hydrotherapy Plan Dressing change;Patient/family education  Wound Therapy - Frequency  (1 x a week)  Wound Therapy - Current Recommendations PT  Wound Plan see below  Wound Therapy  Dressing  nystatin, 4x4 kerlix and netting       Manual completed for improved circulation/edema  PATIENT EDUCATION: Education details: 01/01/23:  Contact MD and return due to wounds are  not healing. 11/21/22, POC, changing dressing if soiled; EVAL Patient educated on exam findings, POC, scope of PT, HEP, and keeping dressing on unless soiled or panful. Person educated: Patient Education method: Explanation, Demonstration, and Handouts Education comprehension: verbalized understanding, returned demonstration, verbal cues required, and tactile cues required   HOME EXERCISE PROGRAM: N/a   GOALS: Goals reviewed with patient? Yes  SHORT TERM GOALS: Target date: 10/25/2022    Wound to be free from slough to demonstrate healing.  Baseline: Goal status: MET   LONG TERM GOALS: Target date: 11/15/2022    Patient wound to be healed to reduce risk of infection. Baseline:  Goal status: IN PROGRESS  2.  Patient will state importance of compression garment use and be able to don to reduce risk of further wound development.  Baseline:  Goal status: IN PROGRESS   ASSESSMENT:  CLINICAL IMPRESSION: Pt  continues to have a mix of dryness, with sloughing dead skin and redness throughout entire LE, however, rawness and drainage has decreased. Therapist counseled pt on the importance of not scratching her leg as the pt has nail mark and blood on the superior aspect of her leg from her scratching.  Therapist explained about bacteria under the pt nails and suggested if she absolutely needs to do something to take lotion and the palm of her hand and gently rub which should decrease  the itching sensation.  Pt vocalized understanding.  Educated pt  that if she notes that the dressing is wet she is to remove, cleanse her leg apply a liberal amount of nystatin to her entire leg  using manual decongestive techniques to assist in edema control followed by  bandage with a gauze bandage.  PT vocalized understanding.  Therapist explained that we are going to decrease to one time a week to monitor new treatment.  Therapist explained that MD does not want compression at this time to see how her LE  does with a gauze bandage.     OBJECTIVE IMPAIRMENTS: Abnormal gait, decreased activity tolerance, decreased balance, decreased mobility, difficulty walking, impaired sensation, and improper body mechanics.   ACTIVITY LIMITATIONS: carrying, lifting, standing, squatting, stairs, transfers, locomotion level, and caring for others  PARTICIPATION LIMITATIONS: meal prep, cleaning, laundry, shopping, community activity, and yard work  PERSONAL FACTORS: 3+ comorbidities: DM, CKD, HLD, HTN  are also affecting patient's functional outcome.   REHAB POTENTIAL: Good  CLINICAL DECISION MAKING: Evolving/moderate complexity  EVALUATION COMPLEXITY: Moderate  PLAN: PT FREQUENCY: 1 x per week   PT DURATION: 4 weeks or until pt has dermatologist appointment.  If pt is returning to dermatologist on a regular basis pt may be discharged.   PLANNED INTERVENTIONS: Therapeutic exercises, Therapeutic activity, Neuromuscular re-education, Balance training, Gait training, Patient/Family education, Joint manipulation, Joint mobilization, Stair training, Orthotic/Fit training, DME instructions, Aquatic Therapy, Dry Needling, Electrical stimulation, Spinal manipulation, Spinal mobilization, Cryotherapy, Moist heat, Compression bandaging, scar mobilization, Splintting, Taping, Traction, Ultrasound, Ionotophoresis 4mg /ml Dexamethasone, and Manual therapy   PLAN FOR NEXT SESSION: Continue to  asess how new antibiotic and nystatin creme does to improve skin integrity.   Virgina Organ, PT CLT [phone]  04/01/2023, 10:24 AM

## 2023-04-02 ENCOUNTER — Other Ambulatory Visit: Payer: Self-pay

## 2023-04-02 MED ORDER — TRULICITY 1.5 MG/0.5ML ~~LOC~~ SOAJ: 1.5000 mg | SUBCUTANEOUS | 0 refills | Status: DC

## 2023-04-05 DIAGNOSIS — Z1159 Encounter for screening for other viral diseases: Secondary | ICD-10-CM | POA: Diagnosis not present

## 2023-04-05 DIAGNOSIS — Z20828 Contact with and (suspected) exposure to other viral communicable diseases: Secondary | ICD-10-CM | POA: Diagnosis not present

## 2023-04-09 ENCOUNTER — Ambulatory Visit (HOSPITAL_COMMUNITY): Payer: Medicare HMO | Admitting: Physical Therapy

## 2023-04-09 DIAGNOSIS — R2689 Other abnormalities of gait and mobility: Secondary | ICD-10-CM

## 2023-04-09 DIAGNOSIS — E11621 Type 2 diabetes mellitus with foot ulcer: Secondary | ICD-10-CM

## 2023-04-09 DIAGNOSIS — L97519 Non-pressure chronic ulcer of other part of right foot with unspecified severity: Secondary | ICD-10-CM | POA: Diagnosis not present

## 2023-04-09 NOTE — Therapy (Signed)
OUTPATIENT PHYSICAL THERAPY Wound Treatment   Patient Name: Krista Payne MRN: [id number] DOB:[birth year], 82 y.o., female Today's Date: 04/09/2023    PCP: Benita Stabile, MD REFERRING PROVIDER: Benita Stabile, MD  END OF SESSION:   PT End of Session - 04/09/23 1604     Visit Number 35    Number of Visits 37    Date for PT Re-Evaluation 04/28/31    Authorization Type Primary: [employer] Medicare Secondary: Tricare (no auth, no VL)    Progress Note Due on Visit 37    PT Start Time 1435    PT Stop Time 1500    PT Time Calculation (min) 25 min    Activity Tolerance Patient tolerated treatment well    Behavior During Therapy WFL for tasks assessed/performed                   Past Medical History:  Diagnosis Date   Breast cancer (HCC)    R   Chronic kidney disease    Diabetes mellitus without complication (HCC)    Hyperlipidemia    Hypertension    Macular degeneration    Osteoarthritis    Past Surgical History:  Procedure Laterality Date   CATARACT EXTRACTION W/PHACO Right 02/07/2015   Procedure: CATARACT EXTRACTION PHACO AND INTRAOCULAR LENS PLACEMENT (IOC);  Surgeon: Gemma Payor, MD;  Location: [facility];  Service: Ophthalmology;  Laterality: Right;  CDE:12.72   CATARACT EXTRACTION W/PHACO Left 02/24/2015   Procedure: CATARACT EXTRACTION PHACO AND INTRAOCULAR LENS PLACEMENT LEFT EYE;  Surgeon: Gemma Payor, MD;  Location: [facility];  Service: Ophthalmology;  Laterality: Left;  CDE 11.69   COLONOSCOPY     Patient Active Problem List   Diagnosis Date Noted   Unspecified Escherichia coli (E. coli) as the cause of diseases classified elsewhere 02/23/2022   Sepsis (HCC) 02/22/2022   Acute kidney failure (HCC) 02/22/2022   Fall at home, initial encounter 02/22/2022   Personal history of COVID-19 02/02/2022   Pressure injury of skin 11/15/2021   Depression, unspecified 11/15/2021   Hypertensive heart and chronic kidney disease with heart failure and stage 1 through stage 4 chronic  kidney disease, or unspecified chronic kidney disease (HCC) 11/15/2021   Insomnia 11/15/2021   Muscle weakness (generalized) 11/15/2021   Other abnormalities of gait and mobility 11/15/2021   Other forms of acute ischemic heart disease 11/15/2021   Traumatic ischemia of muscle (HCC) 11/15/2021   Diastolic heart failure (HCC) 11/15/2021   Diabetic foot infection (HCC) 11/14/2021   Acute metabolic encephalopathy 11/14/2021   Chronic kidney disease, stage 3b (HCC) 11/14/2021   Type 2 diabetes with nephropathy (HCC) 11/14/2021   Elevated troponin 11/13/2021   Lactic acidosis 11/13/2021   COVID-19 virus infection 11/13/2021   Hyperglycemia due to diabetes mellitus (HCC) 11/13/2021   Ankle wound, right, initial encounter 11/13/2021   Altered mental status 11/13/2021   Dehydration 11/13/2021   Long term (current) use of oral hypoglycemic drugs 10/29/2021   Unspecified macular degeneration 10/29/2021   Chronic kidney disease 05/17/2021   Personal history of malignant neoplasm of breast 10/29/2020   Malignant neoplasm of unspecified site of right female breast (HCC) 07/16/2019   URI (upper respiratory infection) 11/20/2011   Other seasonal allergic rhinitis 11/23/2010   Urinary tract infection without hematuria 11/23/2010   RENAL INSUFFICIENCY 10/26/2010   OSTEOARTHRITIS 10/18/2010   Osteoarthritis 10/18/2010   Vitamin D deficiency 08/10/2009   Hyperlipidemia 08/10/2009   Body mass index (BMI) 39.0-39.9, adult 08/10/2009   EDEMA 01/20/2008  Anemia in chronic kidney disease 06/19/2007   Hypertension 06/02/2007    ONSET DATE: 07/05/22  REFERRING DIAG: ulcer of right foot due to type 2 diabetes mellitus  THERAPY DIAG:  Ulcer of right foot due to type 2 diabetes mellitus (HCC)  Other abnormalities of gait and mobility  Rationale for Evaluation and Treatment: Rehabilitation  Wound Therapy - 04/09/23 1605     Subjective reports it feels fine, no issues    Patient and Family Stated  Goals wound to heal    Date of Onset 07/05/22    Prior Treatments cream/antibiotyic    Evaluation and Treatment Procedures Explained to Patient/Family Yes    Evaluation and Treatment Procedures agreed to    Wound Properties Date First Assessed: 10/04/22 Time First Assessed: 1345 Wound Type: Venous stasis ulcer Location: Pretibial Location Orientation: Distal;Right Wound Description (Comments): medial ankle wound superior Present on Admission: Yes   Wound Image Images linked: 1    Dressing Type Gauze (Comment);Compression wrap    Dressing Changed Changed    Dressing Status Old drainage    Dressing Change Frequency PRN    Site / Wound Assessment Red;Dry;Pink    Peri-wound Assessment Erythema (blanchable);Edema    Drainage Amount None    Treatment Cleansed    Selective Debridement (non-excisional) - Location dry skin able to be removed via wash cloth and forceps    Selective Debridement (non-excisional) - Tools Used Forceps    Selective Debridement (non-excisional) - Tissue Removed dry skin    Wound Therapy - Clinical Statement see below    Wound Therapy - Functional Problem List bathing, dressing    Factors Delaying/Impairing Wound Healing Altered sensation;Diabetes Mellitus;Vascular compromise;Multiple medical problems    Hydrotherapy Plan Dressing change;Patient/family education    Wound Therapy - Frequency --   1 x a week   Wound Therapy - Current Recommendations PT    Wound Plan see below    Dressing  nystatin, 4x4 kerlix and netting                  Manual completed for improved circulation/edema  PATIENT EDUCATION: Education details: 01/01/23:  Contact MD and return due to wounds are not healing. 11/21/22, POC, changing dressing if soiled; EVAL Patient educated on exam findings, POC, scope of PT, HEP, and keeping dressing on unless soiled or panful. Person educated: Patient Education method: Explanation, Demonstration, and Handouts Education comprehension: verbalized  understanding, returned demonstration, verbal cues required, and tactile cues required   HOME EXERCISE PROGRAM: N/a   GOALS: Goals reviewed with patient? Yes  SHORT TERM GOALS: Target date: 10/25/2022    Wound to be free from slough to demonstrate healing.  Baseline: Goal status: MET   LONG TERM GOALS: Target date: 11/15/2022    Patient wound to be healed to reduce risk of infection. Baseline:  Goal status: IN PROGRESS  2.  Patient will state importance of compression garment use and be able to don to reduce risk of further wound development.  Baseline:  Goal status: IN PROGRESS   ASSESSMENT:  CLINICAL IMPRESSION: Rt LE photographed.  Much improved today with no drainage and moderate amount of dry skin.  No dry skin on dorsal foot where wrap was not present/skin exposed.  Cleansed LE well and reapplied nystatin cream. Redness still present but not as bad as usual.  No signs of pt scratching self/no open areas.  Secured area with kerlix following application of cream without compression.  May attempt to go without bandage next session if  continues to improve.  MD does not want compression on LE.      OBJECTIVE IMPAIRMENTS: Abnormal gait, decreased activity tolerance, decreased balance, decreased mobility, difficulty walking, impaired sensation, and improper body mechanics.   ACTIVITY LIMITATIONS: carrying, lifting, standing, squatting, stairs, transfers, locomotion level, and caring for others  PARTICIPATION LIMITATIONS: meal prep, cleaning, laundry, shopping, community activity, and yard work  PERSONAL FACTORS: 3+ comorbidities: DM, CKD, HLD, HTN  are also affecting patient's functional outcome.   REHAB POTENTIAL: Good  CLINICAL DECISION MAKING: Evolving/moderate complexity  EVALUATION COMPLEXITY: Moderate  PLAN: PT FREQUENCY: 1 x per week   PT DURATION: 4 weeks or until pt has dermatologist appointment.  If pt is returning to dermatologist on a regular basis pt may  be discharged.   PLANNED INTERVENTIONS: Therapeutic exercises, Therapeutic activity, Neuromuscular re-education, Balance training, Gait training, Patient/Family education, Joint manipulation, Joint mobilization, Stair training, Orthotic/Fit training, DME instructions, Aquatic Therapy, Dry Needling, Electrical stimulation, Spinal manipulation, Spinal mobilization, Cryotherapy, Moist heat, Compression bandaging, scar mobilization, Splintting, Taping, Traction, Ultrasound, Ionotophoresis 4mg /ml Dexamethasone, and Manual therapy   PLAN FOR NEXT SESSION: Continue to  asess how nystatin creme does to improve skin integrity.  May trial no wrap, just cream next session if no drainage.  Lurena Nida, PTA/CLT [facility] Ph: [phone] 04/09/2023, 4:51 PM

## 2023-04-10 DIAGNOSIS — E1165 Type 2 diabetes mellitus with hyperglycemia: Secondary | ICD-10-CM | POA: Diagnosis not present

## 2023-04-11 DIAGNOSIS — Z713 Dietary counseling and surveillance: Secondary | ICD-10-CM | POA: Diagnosis not present

## 2023-04-11 DIAGNOSIS — R600 Localized edema: Secondary | ICD-10-CM | POA: Diagnosis not present

## 2023-04-11 DIAGNOSIS — L0390 Cellulitis, unspecified: Secondary | ICD-10-CM | POA: Diagnosis not present

## 2023-04-11 DIAGNOSIS — R21 Rash and other nonspecific skin eruption: Secondary | ICD-10-CM | POA: Diagnosis not present

## 2023-04-11 DIAGNOSIS — L03115 Cellulitis of right lower limb: Secondary | ICD-10-CM | POA: Diagnosis not present

## 2023-04-11 DIAGNOSIS — R296 Repeated falls: Secondary | ICD-10-CM | POA: Diagnosis not present

## 2023-04-11 DIAGNOSIS — I10 Essential (primary) hypertension: Secondary | ICD-10-CM | POA: Diagnosis not present

## 2023-04-15 ENCOUNTER — Ambulatory Visit (HOSPITAL_COMMUNITY): Payer: Medicare HMO

## 2023-04-15 ENCOUNTER — Encounter (HOSPITAL_COMMUNITY): Payer: Self-pay

## 2023-04-15 DIAGNOSIS — E11621 Type 2 diabetes mellitus with foot ulcer: Secondary | ICD-10-CM | POA: Diagnosis not present

## 2023-04-15 DIAGNOSIS — L97519 Non-pressure chronic ulcer of other part of right foot with unspecified severity: Secondary | ICD-10-CM | POA: Diagnosis not present

## 2023-04-15 DIAGNOSIS — R2689 Other abnormalities of gait and mobility: Secondary | ICD-10-CM

## 2023-04-15 NOTE — Therapy (Addendum)
OUTPATIENT PHYSICAL THERAPY Wound Treatment   Patient Name: Krista Payne MRN: [id number] DOB:04/14/1941, 82 y.o., female Today's Date: 04/15/2023    PCP: Benita Stabile, MD REFERRING PROVIDER: Benita Stabile, MD  END OF SESSION:   PT End of Session - 04/15/23 0842     Visit Number 36    Number of Visits 37    Date for PT Re-Evaluation 04/28/31    Authorization Type Primary: [employer] Medicare Secondary: Tricare (no auth, no VL)    Progress Note Due on Visit 37    PT Start Time 0810    PT Stop Time 0835    PT Time Calculation (min) 25 min    Activity Tolerance Patient tolerated treatment well    Behavior During Therapy [facility] for tasks assessed/performed                   Past Medical History:  Diagnosis Date   Breast cancer (HCC)    R   Chronic kidney disease    Diabetes mellitus without complication (HCC)    Hyperlipidemia    Hypertension    Macular degeneration    Osteoarthritis    Past Surgical History:  Procedure Laterality Date   CATARACT EXTRACTION W/PHACO Right 02/07/2015   Procedure: CATARACT EXTRACTION PHACO AND INTRAOCULAR LENS PLACEMENT (IOC);  Surgeon: Gemma Payor, MD;  Location: [facility];  Service: Ophthalmology;  Laterality: Right;  CDE:12.72   CATARACT EXTRACTION W/PHACO Left 02/24/2015   Procedure: CATARACT EXTRACTION PHACO AND INTRAOCULAR LENS PLACEMENT LEFT EYE;  Surgeon: Gemma Payor, MD;  Location: [facility];  Service: Ophthalmology;  Laterality: Left;  CDE 11.69   COLONOSCOPY     Patient Active Problem List   Diagnosis Date Noted   Unspecified Escherichia coli (E. coli) as the cause of diseases classified elsewhere 02/23/2022   Sepsis (HCC) 02/22/2022   Acute kidney failure (HCC) 02/22/2022   Fall at home, initial encounter 02/22/2022   Personal history of COVID-19 02/02/2022   Pressure injury of skin 11/15/2021   Depression, unspecified 11/15/2021   Hypertensive heart and chronic kidney disease with heart failure and stage 1 through stage 4 chronic  kidney disease, or unspecified chronic kidney disease (HCC) 11/15/2021   Insomnia 11/15/2021   Muscle weakness (generalized) 11/15/2021   Other abnormalities of gait and mobility 11/15/2021   Other forms of acute ischemic heart disease 11/15/2021   Traumatic ischemia of muscle (HCC) 11/15/2021   Diastolic heart failure (HCC) 11/15/2021   Diabetic foot infection (HCC) 11/14/2021   Acute metabolic encephalopathy 11/14/2021   Chronic kidney disease, stage 3b (HCC) 11/14/2021   Type 2 diabetes with nephropathy (HCC) 11/14/2021   Elevated troponin 11/13/2021   Lactic acidosis 11/13/2021   COVID-19 virus infection 11/13/2021   Hyperglycemia due to diabetes mellitus (HCC) 11/13/2021   Ankle wound, right, initial encounter 11/13/2021   Altered mental status 11/13/2021   Dehydration 11/13/2021   Long term (current) use of oral hypoglycemic drugs 10/29/2021   Unspecified macular degeneration 10/29/2021   Chronic kidney disease 05/17/2021   Personal history of malignant neoplasm of breast 10/29/2020   Malignant neoplasm of unspecified site of right female breast (HCC) 07/16/2019   URI (upper respiratory infection) 11/20/2011   Other seasonal allergic rhinitis 11/23/2010   Urinary tract infection without hematuria 11/23/2010   RENAL INSUFFICIENCY 10/26/2010   OSTEOARTHRITIS 10/18/2010   Osteoarthritis 10/18/2010   Vitamin D deficiency 08/10/2009   Hyperlipidemia 08/10/2009   Body mass index (BMI) 39.0-39.9, adult 08/10/2009   EDEMA 01/20/2008  Anemia in chronic kidney disease 06/19/2007   Hypertension 06/02/2007    ONSET DATE: 07/05/22  REFERRING DIAG: ulcer of right foot due to type 2 diabetes mellitus  THERAPY DIAG:  Ulcer of right foot due to type 2 diabetes mellitus (HCC)  Other abnormalities of gait and mobility  Rationale for Evaluation and Treatment: Rehabilitation  Wound Therapy - 04/15/23 0001     Subjective Pt arrived without bandages, reports she had apt on Friday and  the dressings slid off.    Patient and Family Stated Goals wound to heal    Date of Onset 07/05/22    Prior Treatments cream/antibiotyic    Pain Scale 0-10    Pain Score 0-No pain    Evaluation and Treatment Procedures Explained to Patient/Family Yes    Evaluation and Treatment Procedures agreed to    Wound Properties Date First Assessed: 10/04/22 Time First Assessed: 1345 Wound Type: Venous stasis ulcer Location: Pretibial Location Orientation: Distal;Right Wound Description (Comments): medial ankle wound superior Present on Admission: Yes   Wound Image Images linked: 1    Dressing Type Gauze (Comment)   nystatin, 4x4 kerlix and netting #5   Dressing Changed Changed    Dressing Status Old drainage    Dressing Change Frequency PRN    Site / Wound Assessment Red;Dry;Pink    % Wound base Red or Granulating 95%    % Wound base Yellow/Fibrinous Exudate 5%    Peri-wound Assessment Erythema (blanchable);Edema    Drainage Amount Scant    Drainage Description Sanguineous    Treatment Cleansed    Selective Debridement (non-excisional) - Location dry skin able to be removed via wash cloth and forceps    Selective Debridement (non-excisional) - Tools Used Forceps    Selective Debridement (non-excisional) - Tissue Removed dry skin    Wound Therapy - Clinical Statement see below    Wound Therapy - Functional Problem List bathing, dressing    Factors Delaying/Impairing Wound Healing Altered sensation;Diabetes Mellitus;Vascular compromise;Multiple medical problems    Hydrotherapy Plan Dressing change;Patient/family education    Wound Therapy - Frequency 2X / week    Wound Therapy - Current Recommendations PT    Wound Plan see below    Dressing  nystatin, 4x4 kerlix and netting #5                  Manual completed for improved circulation/edema  PATIENT EDUCATION: Education details: 01/01/23:  Contact MD and return due to wounds are not healing. 11/21/22, POC, changing dressing if soiled;  EVAL Patient educated on exam findings, POC, scope of PT, HEP, and keeping dressing on unless soiled or panful. Person educated: Patient Education method: Explanation, Demonstration, and Handouts Education comprehension: verbalized understanding, returned demonstration, verbal cues required, and tactile cues required   HOME EXERCISE PROGRAM: N/a   GOALS: Goals reviewed with patient? Yes  SHORT TERM GOALS: Target date: 10/25/2022    Wound to be free from slough to demonstrate healing.  Baseline: Goal status: MET   LONG TERM GOALS: Target date: 11/15/2022    Patient wound to be healed to reduce risk of infection. Baseline:  Goal status: IN PROGRESS  2.  Patient will state importance of compression garment use and be able to don to reduce risk of further wound development.  Baseline:  Goal status: IN PROGRESS   ASSESSMENT:  CLINICAL IMPRESSION:  Pt arrived without dressings on LE with new wounds on lateral aspect of leg (picture in media).  Therapist believes may be related to  scratching, stressed importance of not scratching for skin integrity and strongly encouraged to keep wounds covered to promote healing.  LE cleansed with selective debridement for removal of dry skin perimeter and slough from wound bed.  Continued with nystatin, 4x4 and kerlix with netting.  No additional compression per MD.       OBJECTIVE IMPAIRMENTS: Abnormal gait, decreased activity tolerance, decreased balance, decreased mobility, difficulty walking, impaired sensation, and improper body mechanics.   ACTIVITY LIMITATIONS: carrying, lifting, standing, squatting, stairs, transfers, locomotion level, and caring for others  PARTICIPATION LIMITATIONS: meal prep, cleaning, laundry, shopping, community activity, and yard work  PERSONAL FACTORS: 3+ comorbidities: DM, CKD, HLD, HTN  are also affecting patient's functional outcome.   REHAB POTENTIAL: Good  CLINICAL DECISION MAKING: Evolving/moderate  complexity  EVALUATION COMPLEXITY: Moderate  PLAN: PT FREQUENCY: 1 x per week   PT DURATION: 4 weeks or until pt has dermatologist appointment.  If pt is returning to dermatologist on a regular basis pt may be discharged. Reassess next session.   PLANNED INTERVENTIONS: Therapeutic exercises, Therapeutic activity, Neuromuscular re-education, Balance training, Gait training, Patient/Family education, Joint manipulation, Joint mobilization, Stair training, Orthotic/Fit training, DME instructions, Aquatic Therapy, Dry Needling, Electrical stimulation, Spinal manipulation, Spinal mobilization, Cryotherapy, Moist heat, Compression bandaging, scar mobilization, Splintting, Taping, Traction, Ultrasound, Ionotophoresis 4mg /ml Dexamethasone, and Manual therapy   PLAN FOR NEXT SESSION: Continue to  asess how nystatin creme does to improve skin integrity.  May trial no wrap, just cream next session if no drainage.  Becky Sax, LPTA/CLT; CBIS [phone]  Juel Burrow, PTA 04/15/2023, 8:47 AM  04/15/2023, 8:47 AM

## 2023-04-22 ENCOUNTER — Ambulatory Visit (HOSPITAL_COMMUNITY): Payer: Medicare HMO | Admitting: Physical Therapy

## 2023-04-22 DIAGNOSIS — E11621 Type 2 diabetes mellitus with foot ulcer: Secondary | ICD-10-CM

## 2023-04-22 DIAGNOSIS — L97519 Non-pressure chronic ulcer of other part of right foot with unspecified severity: Secondary | ICD-10-CM | POA: Diagnosis not present

## 2023-04-22 DIAGNOSIS — R2689 Other abnormalities of gait and mobility: Secondary | ICD-10-CM | POA: Diagnosis not present

## 2023-04-22 NOTE — Therapy (Signed)
OUTPATIENT PHYSICAL THERAPY Wound Treatment Progress Note Reporting Period 03/29/23 to 04/22/23  See note below for Objective Data and Assessment of Progress/Goals.       Patient Name: Krista Payne MRN: [id number] DOB:August 14, 1941, 82 y.o., female Today's Date: 04/22/2023    PCP: Benita Stabile, MD REFERRING PROVIDER: Benita Stabile, MD  END OF SESSION:   PT End of Session - 04/22/23 1028     Visit Number 37    Number of Visits 37    Date for PT Re-Evaluation 05/20/23    Authorization Type Primary: [employer] Medicare Secondary: Tricare (no auth, no VL)    Progress Note Due on Visit 47    PT Start Time 0945    PT Stop Time 1010    PT Time Calculation (min) 25 min    Activity Tolerance Patient tolerated treatment well    Behavior During Therapy WFL for tasks assessed/performed                   Past Medical History:  Diagnosis Date   Breast cancer (HCC)    R   Chronic kidney disease    Diabetes mellitus without complication (HCC)    Hyperlipidemia    Hypertension    Macular degeneration    Osteoarthritis    Past Surgical History:  Procedure Laterality Date   CATARACT EXTRACTION W/PHACO Right 02/07/2015   Procedure: CATARACT EXTRACTION PHACO AND INTRAOCULAR LENS PLACEMENT (IOC);  Surgeon: Gemma Payor, MD;  Location: [facility];  Service: Ophthalmology;  Laterality: Right;  CDE:12.72   CATARACT EXTRACTION W/PHACO Left 02/24/2015   Procedure: CATARACT EXTRACTION PHACO AND INTRAOCULAR LENS PLACEMENT LEFT EYE;  Surgeon: Gemma Payor, MD;  Location: [facility];  Service: Ophthalmology;  Laterality: Left;  CDE 11.69   COLONOSCOPY     Patient Active Problem List   Diagnosis Date Noted   Unspecified Escherichia coli (E. coli) as the cause of diseases classified elsewhere 02/23/2022   Sepsis (HCC) 02/22/2022   Acute kidney failure (HCC) 02/22/2022   Fall at home, initial encounter 02/22/2022   Personal history of COVID-19 02/02/2022   Pressure injury of skin 11/15/2021   Depression,  unspecified 11/15/2021   Hypertensive heart and chronic kidney disease with heart failure and stage 1 through stage 4 chronic kidney disease, or unspecified chronic kidney disease (HCC) 11/15/2021   Insomnia 11/15/2021   Muscle weakness (generalized) 11/15/2021   Other abnormalities of gait and mobility 11/15/2021   Other forms of acute ischemic heart disease 11/15/2021   Traumatic ischemia of muscle (HCC) 11/15/2021   Diastolic heart failure (HCC) 11/15/2021   Diabetic foot infection (HCC) 11/14/2021   Acute metabolic encephalopathy 11/14/2021   Chronic kidney disease, stage 3b (HCC) 11/14/2021   Type 2 diabetes with nephropathy (HCC) 11/14/2021   Elevated troponin 11/13/2021   Lactic acidosis 11/13/2021   COVID-19 virus infection 11/13/2021   Hyperglycemia due to diabetes mellitus (HCC) 11/13/2021   Ankle wound, right, initial encounter 11/13/2021   Altered mental status 11/13/2021   Dehydration 11/13/2021   Long term (current) use of oral hypoglycemic drugs 10/29/2021   Unspecified macular degeneration 10/29/2021   Chronic kidney disease 05/17/2021   Personal history of malignant neoplasm of breast 10/29/2020   Malignant neoplasm of unspecified site of right female breast (HCC) 07/16/2019   URI (upper respiratory infection) 11/20/2011   Other seasonal allergic rhinitis 11/23/2010   Urinary tract infection without hematuria 11/23/2010   RENAL INSUFFICIENCY 10/26/2010   OSTEOARTHRITIS 10/18/2010   Osteoarthritis 10/18/2010  Vitamin D deficiency 08/10/2009   Hyperlipidemia 08/10/2009   Body mass index (BMI) 39.0-39.9, adult 08/10/2009   EDEMA 01/20/2008   Anemia in chronic kidney disease 06/19/2007   Hypertension 06/02/2007    ONSET DATE: 07/05/22  REFERRING DIAG: ulcer of right foot due to type 2 diabetes mellitus  THERAPY DIAG:  Ulcer of right foot due to type 2 diabetes mellitus (HCC)  Other abnormalities of gait and mobility  Rationale for Evaluation and Treatment:  Rehabilitation  Wound Therapy - 04/22/23 1031     Subjective pt comes today wtih top part of dressing rolled down bottle necking.    Patient and Family Stated Goals wound to heal    Date of Onset 07/05/22    Prior Treatments cream/antibiotyic    Evaluation and Treatment Procedures Explained to Patient/Family Yes    Evaluation and Treatment Procedures agreed to    Wound Properties Date First Assessed: 10/04/22 Time First Assessed: 1345 Wound Type: Venous stasis ulcer Location: Pretibial Location Orientation: Distal;Right Wound Description (Comments): medial ankle wound superior Present on Admission: Yes   Wound Image Images linked: 1    Dressing Type Gauze (Comment)   nystatin, 4x4 kerlix and netting #5   Dressing Changed Other (Comment)    Dressing Status Old drainage   no dressing applied   Dressing Change Frequency PRN    Site / Wound Assessment Red;Dry;Pink    % Wound base Red or Granulating 100%    % Wound base Yellow/Fibrinous Exudate 100%    Peri-wound Assessment Erythema (blanchable);Edema    Drainage Amount None    Treatment Cleansed;Debridement (Selective)    Selective Debridement (non-excisional) - Location dry skin able to be removed via wash cloth and forceps    Selective Debridement (non-excisional) - Tools Used Forceps    Selective Debridement (non-excisional) - Tissue Removed dry skin    Wound Therapy - Clinical Statement see below    Wound Therapy - Functional Problem List bathing, dressing    Factors Delaying/Impairing Wound Healing Altered sensation;Diabetes Mellitus;Vascular compromise;Multiple medical problems    Hydrotherapy Plan Dressing change;Patient/family education    Wound Therapy - Frequency 2X / week    Wound Therapy - Current Recommendations PT    Wound Plan see below    Dressing  nystatin, vaseline, no dressing                   Manual completed for improved circulation/edema  PATIENT EDUCATION: Education details: 01/01/23:  Contact MD and  return due to wounds are not healing. 11/21/22, POC, changing dressing if soiled; EVAL Patient educated on exam findings, POC, scope of PT, HEP, and keeping dressing on unless soiled or panful. Person educated: Patient Education method: Explanation, Demonstration, and Handouts Education comprehension: verbalized understanding, returned demonstration, verbal cues required, and tactile cues required   HOME EXERCISE PROGRAM: N/a   GOALS: Goals reviewed with patient? Yes  SHORT TERM GOALS: Target date: 10/25/2022    Wound to be free from slough to demonstrate healing.  Baseline: Goal status: MET   LONG TERM GOALS: Target date: 11/15/2022    Patient wound to be healed to reduce risk of infection. Baseline:  Goal status: IN PROGRESS  2.  Patient will state importance of compression garment use and be able to don to reduce risk of further wound development.  Baseline:  Goal status: IN PROGRESS   ASSESSMENT:  CLINICAL IMPRESSION:  Pt arrived with dressings still intact to distal LE but rolled down mid-way and creating a bottle neck  compression to LE.  Noted tissue under dressing appeared in worse condition as the skin out of the bandaging.  Discussed with patient as she is still not following therapist orders to remove dressings when they slide down and is causing uneven compression and compromising circulation/wounds.  Explained her scratching her LE's are causing wounds and she should be applying lotion rather than scratching.  Also, noted soiled dressing around foot.  Cleansed LE well, moisturized and educated to keep LE clean and moisturized.  Reminded not to scratch her LE and to apply lotion when she gets that urge but at least 2X day she needs to be moisturizing LE.  Instructed to also continue with nystatin cream before going to bed at night.  Pt verbalized understanding. Will continue with possible discharge if LE are without openings/drainage next visit.  No openings, wounds or  drainage visible on LE's today.  Rt LE does remain with erythema but no other issues.  No additional compression per MD.       OBJECTIVE IMPAIRMENTS: Abnormal gait, decreased activity tolerance, decreased balance, decreased mobility, difficulty walking, impaired sensation, and improper body mechanics.   ACTIVITY LIMITATIONS: carrying, lifting, standing, squatting, stairs, transfers, locomotion level, and caring for others  PARTICIPATION LIMITATIONS: meal prep, cleaning, laundry, shopping, community activity, and yard work  PERSONAL FACTORS: 3+ comorbidities: DM, CKD, HLD, HTN  are also affecting patient's functional outcome.   REHAB POTENTIAL: Good  CLINICAL DECISION MAKING: Evolving/moderate complexity  EVALUATION COMPLEXITY: Moderate  PLAN: PT FREQUENCY: 1 x per week   PT DURATION: 4 weeks or until pt has dermatologist appointment.  If pt is returning to dermatologist on a regular basis pt may be discharged. Reassess next session.   PLANNED INTERVENTIONS: Therapeutic exercises, Therapeutic activity, Neuromuscular re-education, Balance training, Gait training, Patient/Family education, Joint manipulation, Joint mobilization, Stair training, Orthotic/Fit training, DME instructions, Aquatic Therapy, Dry Needling, Electrical stimulation, Spinal manipulation, Spinal mobilization, Cryotherapy, Moist heat, Compression bandaging, scar mobilization, Splintting, Taping, Traction, Ultrasound, Ionotophoresis 4mg /ml Dexamethasone, and Manual therapy   PLAN FOR NEXT SESSION: Continue to  asess how nystatin creme does to improve skin integrity.  Assess how no wrap, just cream and moisturizer done next session.  Return to wrap if needed.  Continue X 4 more weeks and if not completely healed in this time refer back to MD.   Bascom Levels, Oluwadamilola Rosamond B, PTA 04/22/2023, 1:00 PM  04/22/2023, 1:00 PM

## 2023-04-29 ENCOUNTER — Encounter (HOSPITAL_COMMUNITY): Payer: Self-pay | Admitting: Physical Therapy

## 2023-04-29 ENCOUNTER — Ambulatory Visit (HOSPITAL_COMMUNITY): Payer: Medicare HMO | Attending: Internal Medicine | Admitting: Physical Therapy

## 2023-04-29 DIAGNOSIS — E11621 Type 2 diabetes mellitus with foot ulcer: Secondary | ICD-10-CM | POA: Diagnosis not present

## 2023-04-29 DIAGNOSIS — L97519 Non-pressure chronic ulcer of other part of right foot with unspecified severity: Secondary | ICD-10-CM | POA: Diagnosis not present

## 2023-04-29 DIAGNOSIS — R2689 Other abnormalities of gait and mobility: Secondary | ICD-10-CM | POA: Insufficient documentation

## 2023-04-29 DIAGNOSIS — S81801A Unspecified open wound, right lower leg, initial encounter: Secondary | ICD-10-CM | POA: Diagnosis not present

## 2023-04-29 DIAGNOSIS — S81802S Unspecified open wound, left lower leg, sequela: Secondary | ICD-10-CM | POA: Diagnosis not present

## 2023-04-29 NOTE — Therapy (Signed)
OUTPATIENT PHYSICAL THERAPY Wound Treatment      Patient Name: Krista Payne MRN: [id number] DOB:1941/08/14, 82 y.o., female Today's Date: 04/29/2023    PCP: Benita Stabile, MD REFERRING PROVIDER: Benita Stabile, MD  END OF SESSION:   PT End of Session - 04/29/23 0930     Visit Number 38    Number of Visits 41    Date for PT Re-Evaluation 05/20/23    Authorization Type Primary: [employer] Medicare Secondary: T[employer]are (no auth, no VL)    Progress Note Due on Visit 47    PT Start Time 0906    PT Stop Time 0930    PT Time Calculation (min) 24 min    Activity Tolerance Patient tolerated treatment well    Behavior During Therapy WFL for tasks assessed/performed                   Past Medical History:  Diagnosis Date   Breast cancer (HCC)    R   Chronic kidney disease    Diabetes mellitus without complication (HCC)    Hyperlipidemia    Hypertension    Macular degeneration    Osteoarthritis    Past Surgical History:  Procedure Laterality Date   CATARACT EXTRACTION W/PHACO Right 02/07/2015   Procedure: CATARACT EXTRACTION PHACO AND INTRAOCULAR LENS PLACEMENT (IOC);  Surgeon: Gemma Payor, MD;  Location: [facility];  Service: Ophthalmology;  Laterality: Right;  CDE:12.72   CATARACT EXTRACTION W/PHACO Left 02/24/2015   Procedure: CATARACT EXTRACTION PHACO AND INTRAOCULAR LENS PLACEMENT LEFT EYE;  Surgeon: Gemma Payor, MD;  Location: [facility];  Service: Ophthalmology;  Laterality: Left;  CDE 11.69   COLONOSCOPY     Patient Active Problem List   Diagnosis Date Noted   Unspecified Escherichia coli (E. coli) as the cause of diseases classified elsewhere 02/23/2022   Sepsis (HCC) 02/22/2022   Acute kidney failure (HCC) 02/22/2022   Fall at home, initial encounter 02/22/2022   Personal history of COVID-19 02/02/2022   Pressure injury of skin 11/15/2021   Depression, unspecified 11/15/2021   Hypertensive heart and chronic kidney disease with heart failure and stage 1 through stage 4  chronic kidney disease, or unspecified chronic kidney disease (HCC) 11/15/2021   Insomnia 11/15/2021   Muscle weakness (generalized) 11/15/2021   Other abnormalities of gait and mobility 11/15/2021   Other forms of acute ischemic heart disease 11/15/2021   Traumatic ischemia of muscle (HCC) 11/15/2021   Diastolic heart failure (HCC) 11/15/2021   Diabetic foot infection (HCC) 11/14/2021   Acute metabolic encephalopathy 11/14/2021   Chronic kidney disease, stage 3b (HCC) 11/14/2021   Type 2 diabetes with nephropathy (HCC) 11/14/2021   Elevated troponin 11/13/2021   Lactic acidosis 11/13/2021   COVID-19 virus infection 11/13/2021   Hyperglycemia due to diabetes mellitus (HCC) 11/13/2021   Ankle wound, right, initial encounter 11/13/2021   Altered mental status 11/13/2021   Dehydration 11/13/2021   Long term (current) use of oral hypoglycemic drugs 10/29/2021   Unspecified macular degeneration 10/29/2021   Chronic kidney disease 05/17/2021   Personal history of malignant neoplasm of breast 10/29/2020   Malignant neoplasm of unspecified site of right female breast (HCC) 07/16/2019   URI (upper respiratory infection) 11/20/2011   Other seasonal allergic rhinitis 11/23/2010   Urinary tract infection without hematuria 11/23/2010   RENAL INSUFFICIENCY 10/26/2010   OSTEOARTHRITIS 10/18/2010   Osteoarthritis 10/18/2010   Vitamin D deficiency 08/10/2009   Hyperlipidemia 08/10/2009   Body mass index (BMI) 39.0-39.9, adult 08/10/2009  EDEMA 01/20/2008   Anemia in chronic kidney disease 06/19/2007   Hypertension 06/02/2007    ONSET DATE: 07/05/22  REFERRING DIAG: ulcer of right foot due to type 2 diabetes mellitus  THERAPY DIAG:  Ulcer of right foot due to type 2 diabetes mellitus (HCC)  Other abnormalities of gait and mobility  Rationale for Evaluation and Treatment: Rehabilitation  Wound Therapy - 04/29/23 0001     Subjective pt states she scratched her leg while sleeping due to  forgeting to put lotion on.    Patient and Family Stated Goals wound to heal    Date of Onset 07/05/22    Prior Treatments cream/antibiotyic    Pain Score 0-No pain    Evaluation and Treatment Procedures Explained to Patient/Family Yes    Evaluation and Treatment Procedures agreed to    Wound Properties Date First Assessed: 10/04/22 Time First Assessed: 1345 Wound Type: Venous stasis ulcer Location: Pretibial Location Orientation: Distal;Right Wound Description (Comments): medial ankle wound superior Present on Admission: Yes   Wound Image Images linked: 2    Dressing Type Gauze (Comment)   nystatin, 4x4 kerlix and netting #5   Dressing Status Old drainage   no dressing applied   Dressing Change Frequency PRN    Site / Wound Assessment Red;Dry;Pink    % Wound base Red or Granulating 100%    Peri-wound Assessment Erythema (blanchable);Edema    Drainage Amount None    Treatment Cleansed;Debridement (Selective)    Selective Debridement (non-excisional) - Location dry skin able to be removed via wash cloth and forceps    Selective Debridement (non-excisional) - Tools Used Forceps    Selective Debridement (non-excisional) - Tissue Removed dry skin    Wound Therapy - Clinical Statement see below    Wound Therapy - Functional Problem List bathing, dressing    Factors Delaying/Impairing Wound Healing Altered sensation;Diabetes Mellitus;Vascular compromise;Multiple medical problems    Hydrotherapy Plan Dressing change;Patient/family education    Wound Therapy - Frequency 2X / week    Wound Therapy - Current Recommendations PT    Wound Plan see below    Dressing  nystatin, vaseline, no dressing                   Manual completed for improved circulation/edema  PATIENT EDUCATION: Education details: 01/01/23:  Contact MD and return due to wounds are not healing. 11/21/22, POC, changing dressing if soiled; EVAL Patient educated on exam findings, POC, scope of PT, HEP, and keeping dressing  on unless soiled or panful. Person educated: Patient Education method: Explanation, Demonstration, and Handouts Education comprehension: verbalized understanding, returned demonstration, verbal cues required, and tactile cues required   HOME EXERCISE PROGRAM: N/a   GOALS: Goals reviewed with patient? Yes  SHORT TERM GOALS: Target date: 10/25/2022    Wound to be free from slough to demonstrate healing.  Baseline: Goal status: MET   LONG TERM GOALS: Target date: 11/15/2022    Patient wound to be healed to reduce risk of infection. Baseline:  Goal status: IN PROGRESS  2.  Patient will state importance of compression garment use and be able to don to reduce risk of further wound development.  Baseline:  Goal status: IN PROGRESS   ASSESSMENT:  CLINICAL IMPRESSION:  Patient with various wounds on RLE at different stages of healing evident of scratching her skin.  Rt LE does remain with erythema. Cleansed and debrided dry skin and scab with mostly wash cloth but also a few spots required forceps. Patient's RLE  appearing better without dressing vs. With dressing in prior sessions. Continued with nystatin followed by lotion to RLE. Educated at length the importance of using lotions/medications as directed and discussed strategies to not scratch leg as she scratches aggressively causing wounds to develop. Extending POC to promote wound healing and will likely d/c in next session or 2 as wounds and skin have improved other than recent scratch injuries. No additional compression per MD.       OBJECTIVE IMPAIRMENTS: Abnormal gait, decreased activity tolerance, decreased balance, decreased mobility, difficulty walking, impaired sensation, and improper body mechanics.   ACTIVITY LIMITATIONS: carrying, lifting, standing, squatting, stairs, transfers, locomotion level, and caring for others  PARTICIPATION LIMITATIONS: meal prep, cleaning, laundry, shopping, community activity, and yard  work  PERSONAL FACTORS: 3+ comorbidities: DM, CKD, HLD, HTN  are also affecting patient's functional outcome.   REHAB POTENTIAL: Good  CLINICAL DECISION MAKING: Evolving/moderate complexity  EVALUATION COMPLEXITY: Moderate  PLAN: PT FREQUENCY: 1 x per week   PT DURATION: 4 weeks or until pt has dermatologist appointment.  If pt is returning to dermatologist on a regular basis pt may be discharged.   PLANNED INTERVENTIONS: Therapeutic exercises, Therapeutic activity, Neuromuscular re-education, Balance training, Gait training, Patient/Family education, Joint manipulation, Joint mobilization, Stair training, Orthotic/Fit training, DME instructions, Aquatic Therapy, Dry Needling, Electrical stimulation, Spinal manipulation, Spinal mobilization, Cryotherapy, Moist heat, Compression bandaging, scar mobilization, Splintting, Taping, Traction, Ultrasound, Ionotophoresis 4mg /ml Dexamethasone, and Manual therapy   PLAN FOR NEXT SESSION: Continue to  asess how nystatin creme does to improve skin integrity.  Assess how no wrap, just cream and moisturizer done next session.  Return to wrap if needed.  Continue X 4 more weeks and if not completely healed in this time refer back to MD.   Reola Mosher Ryman Rathgeber, PT 04/29/2023, 9:32 AM  04/29/2023, 9:32 AM

## 2023-05-06 DIAGNOSIS — Z1159 Encounter for screening for other viral diseases: Secondary | ICD-10-CM | POA: Diagnosis not present

## 2023-05-06 DIAGNOSIS — Z20828 Contact with and (suspected) exposure to other viral communicable diseases: Secondary | ICD-10-CM | POA: Diagnosis not present

## 2023-05-09 ENCOUNTER — Ambulatory Visit (HOSPITAL_COMMUNITY): Payer: Medicare HMO

## 2023-05-09 ENCOUNTER — Encounter (HOSPITAL_COMMUNITY): Payer: Self-pay

## 2023-05-09 DIAGNOSIS — E11621 Type 2 diabetes mellitus with foot ulcer: Secondary | ICD-10-CM | POA: Diagnosis not present

## 2023-05-09 DIAGNOSIS — S81802S Unspecified open wound, left lower leg, sequela: Secondary | ICD-10-CM | POA: Diagnosis not present

## 2023-05-09 DIAGNOSIS — S81801A Unspecified open wound, right lower leg, initial encounter: Secondary | ICD-10-CM | POA: Diagnosis not present

## 2023-05-09 DIAGNOSIS — L97519 Non-pressure chronic ulcer of other part of right foot with unspecified severity: Secondary | ICD-10-CM | POA: Diagnosis not present

## 2023-05-09 DIAGNOSIS — R2689 Other abnormalities of gait and mobility: Secondary | ICD-10-CM | POA: Diagnosis not present

## 2023-05-09 NOTE — Therapy (Signed)
OUTPATIENT PHYSICAL THERAPY Wound Treatment      Patient Name: Krista Payne MRN: [id number] DOB:1941-10-01, 82 y.o., female Today's Date: 05/09/2023    PCP: Benita Stabile, MD REFERRING PROVIDER: Benita Stabile, MD  END OF SESSION:   PT End of Session - 05/09/23 1505     Visit Number 39    Number of Visits 41    Date for PT Re-Evaluation 05/20/23    Authorization Type Primary: [employer] Medicare Secondary: Tricare (no auth, no VL)    Progress Note Due on Visit 47    PT Start Time 1435    PT Stop Time 1500    PT Time Calculation (min) 25 min    Activity Tolerance Patient tolerated treatment well    Behavior During Therapy WFL for tasks assessed/performed                   Past Medical History:  Diagnosis Date   Breast cancer (HCC)    R   Chronic kidney disease    Diabetes mellitus without complication (HCC)    Hyperlipidemia    Hypertension    Macular degeneration    Osteoarthritis    Past Surgical History:  Procedure Laterality Date   CATARACT EXTRACTION W/PHACO Right 02/07/2015   Procedure: CATARACT EXTRACTION PHACO AND INTRAOCULAR LENS PLACEMENT (IOC);  Surgeon: Gemma Payor, MD;  Location: [facility];  Service: Ophthalmology;  Laterality: Right;  CDE:12.72   CATARACT EXTRACTION W/PHACO Left 02/24/2015   Procedure: CATARACT EXTRACTION PHACO AND INTRAOCULAR LENS PLACEMENT LEFT EYE;  Surgeon: Gemma Payor, MD;  Location: [facility];  Service: Ophthalmology;  Laterality: Left;  CDE 11.69   COLONOSCOPY     Patient Active Problem List   Diagnosis Date Noted   Unspecified Escherichia coli (E. coli) as the cause of diseases classified elsewhere 02/23/2022   Sepsis (HCC) 02/22/2022   Acute kidney failure (HCC) 02/22/2022   Fall at home, initial encounter 02/22/2022   Personal history of COVID-19 02/02/2022   Pressure injury of skin 11/15/2021   Depression, unspecified 11/15/2021   Hypertensive heart and chronic kidney disease with heart failure and stage 1 through stage 4  chronic kidney disease, or unspecified chronic kidney disease (HCC) 11/15/2021   Insomnia 11/15/2021   Muscle weakness (generalized) 11/15/2021   Other abnormalities of gait and mobility 11/15/2021   Other forms of acute ischemic heart disease 11/15/2021   Traumatic ischemia of muscle (HCC) 11/15/2021   Diastolic heart failure (HCC) 11/15/2021   Diabetic foot infection (HCC) 11/14/2021   Acute metabolic encephalopathy 11/14/2021   Chronic kidney disease, stage 3b (HCC) 11/14/2021   Type 2 diabetes with nephropathy (HCC) 11/14/2021   Elevated troponin 11/13/2021   Lactic acidosis 11/13/2021   COVID-19 virus infection 11/13/2021   Hyperglycemia due to diabetes mellitus (HCC) 11/13/2021   Ankle wound, right, initial encounter 11/13/2021   Altered mental status 11/13/2021   Dehydration 11/13/2021   Long term (current) use of oral hypoglycemic drugs 10/29/2021   Unspecified macular degeneration 10/29/2021   Chronic kidney disease 05/17/2021   Personal history of malignant neoplasm of breast 10/29/2020   Malignant neoplasm of unspecified site of right female breast (HCC) 07/16/2019   URI (upper respiratory infection) 11/20/2011   Other seasonal allergic rhinitis 11/23/2010   Urinary tract infection without hematuria 11/23/2010   RENAL INSUFFICIENCY 10/26/2010   OSTEOARTHRITIS 10/18/2010   Osteoarthritis 10/18/2010   Vitamin D deficiency 08/10/2009   Hyperlipidemia 08/10/2009   Body mass index (BMI) 39.0-39.9, adult 08/10/2009  EDEMA 01/20/2008   Anemia in chronic kidney disease 06/19/2007   Hypertension 06/02/2007    ONSET DATE: 07/05/22  REFERRING DIAG: ulcer of right foot due to type 2 diabetes mellitus  THERAPY DIAG:  Ulcer of right foot due to type 2 diabetes mellitus (HCC)  Rationale for Evaluation and Treatment: Rehabilitation  Wound Therapy - 05/09/23 0001     Subjective Pt stated she did not scratch since here last, reports she is not good to remember lotion onto legs.     Patient and Family Stated Goals wound to heal    Date of Onset 07/05/22    Prior Treatments cream/antibiotyic    Pain Scale 0-10    Pain Score 0-No pain    Evaluation and Treatment Procedures Explained to Patient/Family Yes    Evaluation and Treatment Procedures agreed to    Wound Properties Date First Assessed: 10/04/22 Time First Assessed: 1345 Wound Type: Venous stasis ulcer Location: Pretibial Location Orientation: Distal;Right Wound Description (Comments): medial ankle wound superior Present on Admission: Yes   Wound Image Images linked: 1    Dressing Type --   nystatin, vaseline, no dressing   Dressing Changed Other (Comment)    Dressing Status Old drainage   no dressings applied   Dressing Change Frequency PRN    Site / Wound Assessment Red;Dry;Pink    % Wound base Red or Granulating 100%    Peri-wound Assessment Erythema (blanchable);Edema    Drainage Amount Scant    Drainage Description Serosanguineous    Treatment Cleansed;Debridement (Selective)    Selective Debridement (non-excisional) - Location dry skin able to be removed via wash cloth and forceps    Selective Debridement (non-excisional) - Tools Used Forceps    Selective Debridement (non-excisional) - Tissue Removed dry skin    Wound Therapy - Clinical Statement see below    Wound Therapy - Functional Problem List bathing, dressing    Factors Delaying/Impairing Wound Healing Altered sensation;Diabetes Mellitus;Vascular compromise;Multiple medical problems    Hydrotherapy Plan Dressing change;Patient/family education    Wound Therapy - Frequency 2X / week    Wound Therapy - Current Recommendations PT    Wound Plan see below    Dressing  nystatin, vaseline, no dressing                   Manual completed for improved circulation/edema  PATIENT EDUCATION: Education details: 01/01/23:  Contact MD and return due to wounds are not healing. 11/21/22, POC, changing dressing if soiled; EVAL Patient educated on  exam findings, POC, scope of PT, HEP, and keeping dressing on unless soiled or panful. Person educated: Patient Education method: Explanation, Demonstration, and Handouts Education comprehension: verbalized understanding, returned demonstration, verbal cues required, and tactile cues required   HOME EXERCISE PROGRAM: N/a   GOALS: Goals reviewed with patient? Yes  SHORT TERM GOALS: Target date: 10/25/2022    Wound to be free from slough to demonstrate healing.  Baseline: Goal status: MET   LONG TERM GOALS: Target date: 11/15/2022    Patient wound to be healed to reduce risk of infection. Baseline:  Goal status: IN PROGRESS  2.  Patient will state importance of compression garment use and be able to don to reduce risk of further wound development.  Baseline:  Goal status: IN PROGRESS   ASSESSMENT:  CLINICAL IMPRESSION:  E presents with improved skin integrity, pt stated she has not scratched since last session.  Reviewed importance of continuing to not scratch for skin integrity and infection control.  Pt presents with several small wounds presents posterior Rt LE.  Selective debridement for removal of dry skin and scabs to assess healing with noted slough in wound bed.  LE cleansed and moisturized well with applicatoin of nystatin following by lotion to RLE.  No additional dressings per MD.       OBJECTIVE IMPAIRMENTS: Abnormal gait, decreased activity tolerance, decreased balance, decreased mobility, difficulty walking, impaired sensation, and improper body mechanics.   ACTIVITY LIMITATIONS: carrying, lifting, standing, squatting, stairs, transfers, locomotion level, and caring for others  PARTICIPATION LIMITATIONS: meal prep, cleaning, laundry, shopping, community activity, and yard work  PERSONAL FACTORS: 3+ comorbidities: DM, CKD, HLD, HTN  are also affecting patient's functional outcome.   REHAB POTENTIAL: Good  CLINICAL DECISION MAKING: Evolving/moderate  complexity  EVALUATION COMPLEXITY: Moderate  PLAN: PT FREQUENCY: 1 x per week   PT DURATION: 4 weeks or until pt has dermatologist appointment.  If pt is returning to dermatologist on a regular basis pt may be discharged.   PLANNED INTERVENTIONS: Therapeutic exercises, Therapeutic activity, Neuromuscular re-education, Balance training, Gait training, Patient/Family education, Joint manipulation, Joint mobilization, Stair training, Orthotic/Fit training, DME instructions, Aquatic Therapy, Dry Needling, Electrical stimulation, Spinal manipulation, Spinal mobilization, Cryotherapy, Moist heat, Compression bandaging, scar mobilization, Splintting, Taping, Traction, Ultrasound, Ionotophoresis 4mg /ml Dexamethasone, and Manual therapy   PLAN FOR NEXT SESSION: Continue to  asess how nystatin creme does to improve skin integrity.  Assess how no wrap, just cream and moisturizer done next session.  Return to wrap if needed.  Continue X 4 more weeks and if not completely healed in this time refer back to MD.  Becky Sax, LPTA/CLT; CBIS [phone]  Juel Burrow, PTA 05/09/2023, 3:07 PM  05/09/2023, 3:07 PM

## 2023-05-10 DIAGNOSIS — E1165 Type 2 diabetes mellitus with hyperglycemia: Secondary | ICD-10-CM | POA: Diagnosis not present

## 2023-05-13 ENCOUNTER — Ambulatory Visit (HOSPITAL_COMMUNITY): Payer: Medicare HMO | Admitting: Physical Therapy

## 2023-05-13 DIAGNOSIS — H353231 Exudative age-related macular degeneration, bilateral, with active choroidal neovascularization: Secondary | ICD-10-CM | POA: Diagnosis not present

## 2023-05-14 ENCOUNTER — Ambulatory Visit (HOSPITAL_COMMUNITY): Payer: Medicare HMO | Admitting: Physical Therapy

## 2023-05-14 DIAGNOSIS — L97519 Non-pressure chronic ulcer of other part of right foot with unspecified severity: Secondary | ICD-10-CM | POA: Diagnosis not present

## 2023-05-14 DIAGNOSIS — S81801A Unspecified open wound, right lower leg, initial encounter: Secondary | ICD-10-CM | POA: Diagnosis not present

## 2023-05-14 DIAGNOSIS — S81802S Unspecified open wound, left lower leg, sequela: Secondary | ICD-10-CM | POA: Diagnosis not present

## 2023-05-14 DIAGNOSIS — R2689 Other abnormalities of gait and mobility: Secondary | ICD-10-CM | POA: Diagnosis not present

## 2023-05-14 DIAGNOSIS — E11621 Type 2 diabetes mellitus with foot ulcer: Secondary | ICD-10-CM | POA: Diagnosis not present

## 2023-05-14 NOTE — Therapy (Signed)
OUTPATIENT PHYSICAL THERAPY Wound Treatment      Patient Name: CAROLEANN CASLER MRN: [id number] DOB:1941/03/22, 82 y.o., female Today's Date: 05/14/2023    PCP: Benita Stabile, MD REFERRING PROVIDER: Benita Stabile, MD  END OF SESSION:   PT End of Session - 05/14/23 1101     Visit Number 40    Number of Visits 44    Date for PT Re-Evaluation 06/13/23    Authorization Type Primary: [employer] Medicare Secondary: T[employer]are (no auth, no VL)    Progress Note Due on Visit 44    PT Start Time 1028    PT Stop Time 1051    PT Time Calculation (min) 23 min    Activity Tolerance Patient tolerated treatment well    Behavior During Therapy WFL for tasks assessed/performed                   Past Medical History:  Diagnosis Date   Breast cancer (HCC)    R   Chronic kidney disease    Diabetes mellitus without complication (HCC)    Hyperlipidemia    Hypertension    Macular degeneration    Osteoarthritis    Past Surgical History:  Procedure Laterality Date   CATARACT EXTRACTION W/PHACO Right 02/07/2015   Procedure: CATARACT EXTRACTION PHACO AND INTRAOCULAR LENS PLACEMENT (IOC);  Surgeon: Gemma Payor, MD;  Location: [facility];  Service: Ophthalmology;  Laterality: Right;  CDE:12.72   CATARACT EXTRACTION W/PHACO Left 02/24/2015   Procedure: CATARACT EXTRACTION PHACO AND INTRAOCULAR LENS PLACEMENT LEFT EYE;  Surgeon: Gemma Payor, MD;  Location: [facility];  Service: Ophthalmology;  Laterality: Left;  CDE 11.69   COLONOSCOPY     Patient Active Problem List   Diagnosis Date Noted   Unspecified Escherichia coli (E. coli) as the cause of diseases classified elsewhere 02/23/2022   Sepsis (HCC) 02/22/2022   Acute kidney failure (HCC) 02/22/2022   Fall at home, initial encounter 02/22/2022   Personal history of COVID-19 02/02/2022   Pressure injury of skin 11/15/2021   Depression, unspecified 11/15/2021   Hypertensive heart and chronic kidney disease with heart failure and stage 1 through stage 4  chronic kidney disease, or unspecified chronic kidney disease (HCC) 11/15/2021   Insomnia 11/15/2021   Muscle weakness (generalized) 11/15/2021   Other abnormalities of gait and mobility 11/15/2021   Other forms of acute ischemic heart disease 11/15/2021   Traumatic ischemia of muscle (HCC) 11/15/2021   Diastolic heart failure (HCC) 11/15/2021   Diabetic foot infection (HCC) 11/14/2021   Acute metabolic encephalopathy 11/14/2021   Chronic kidney disease, stage 3b (HCC) 11/14/2021   Type 2 diabetes with nephropathy (HCC) 11/14/2021   Elevated troponin 11/13/2021   Lactic acidosis 11/13/2021   COVID-19 virus infection 11/13/2021   Hyperglycemia due to diabetes mellitus (HCC) 11/13/2021   Ankle wound, right, initial encounter 11/13/2021   Altered mental status 11/13/2021   Dehydration 11/13/2021   Long term (current) use of oral hypoglycemic drugs 10/29/2021   Unspecified macular degeneration 10/29/2021   Chronic kidney disease 05/17/2021   Personal history of malignant neoplasm of breast 10/29/2020   Malignant neoplasm of unspecified site of right female breast (HCC) 07/16/2019   URI (upper respiratory infection) 11/20/2011   Other seasonal allergic rhinitis 11/23/2010   Urinary tract infection without hematuria 11/23/2010   RENAL INSUFFICIENCY 10/26/2010   OSTEOARTHRITIS 10/18/2010   Osteoarthritis 10/18/2010   Vitamin D deficiency 08/10/2009   Hyperlipidemia 08/10/2009   Body mass index (BMI) 39.0-39.9, adult 08/10/2009  EDEMA 01/20/2008   Anemia in chronic kidney disease 06/19/2007   Hypertension 06/02/2007    ONSET DATE: 07/05/22  REFERRING DIAG: ulcer of right foot due to type 2 diabetes mellitus  THERAPY DIAG:  Multiple open wounds of right lower leg  Multiple open wounds of lower leg, left, sequela  Rationale for Evaluation and Treatment: Rehabilitation  Wound Therapy - 05/14/23 0001     Subjective Pt states that she has not scratched her legs even though there  are several scratch marks on her RT LE>    Patient and Family Stated Goals wound to heal    Date of Onset 07/05/22    Prior Treatments cream/antibiotyic    Pain Scale 0-10    Pain Score 0-No pain    Evaluation and Treatment Procedures Explained to Patient/Family Yes    Evaluation and Treatment Procedures agreed to    Wound Properties Date First Assessed: 10/04/22 Time First Assessed: 1345 Wound Type: Venous stasis ulcer Location: Pretibial Location Orientation: Distal;Right Wound Description (Comments): medial ankle wound superior Present on Admission: Yes   Wound Image Images linked: 2    Dressing Type --   nystatin, vaseline, no dressing   Dressing Status None   no dressings applied   Dressing Change Frequency PRN    Site / Wound Assessment Red;Dry;Pink;Yellow    % Wound base Red or Granulating 50%    % Wound base Yellow/Fibrinous Exudate 50%    Peri-wound Assessment Erythema (blanchable);Edema    Drainage Amount None    Treatment Cleansed    Wound Therapy - Clinical Statement see below    Wound Therapy - Functional Problem List bathing, dressing    Factors Delaying/Impairing Wound Healing Altered sensation;Diabetes Mellitus;Vascular compromise;Multiple medical problems    Hydrotherapy Plan Dressing change;Patient/family education    Wound Therapy - Frequency 2X / week   requesting 4 more weeks, hopefully pt can get in to see a dermatolagist by this time.   Wound Therapy - Current Recommendations PT    Wound Plan see below    Dressing  nystatin, vaseline, 4x4 , light kerlix and netting                   Manual completed for improved circulation/edema  PATIENT EDUCATION: Education details: 01/01/23:  Contact MD and return due to wounds are not healing. 11/21/22, POC, changing dressing if soiled; EVAL Patient educated on exam findings, POC, scope of PT, HEP, and keeping dressing on unless soiled or panful. Person educated: Patient Education method: Explanation,  Demonstration, and Handouts Education comprehension: verbalized understanding, returned demonstration, verbal cues required, and tactile cues required   HOME EXERCISE PROGRAM: N/a   GOALS: Goals reviewed with patient? Yes  SHORT TERM GOALS: Target date: 10/25/2022    Wound to be free from slough to demonstrate healing.  Baseline: Goal status: MET   LONG TERM GOALS: Target date: 11/15/2022 extended to 06/13/23)    Patient wound to be healed to reduce risk of infection. Baseline:  Goal status: IN PROGRESS  2.  Patient will state importance of compression garment use and be able to don to reduce risk of further wound development.  Baseline:  Goal status: IN PROGRESS   ASSESSMENT:  CLINICAL IMPRESSION:  PT states that she did not scratch her legs, however, pt has several deep scratch marks on her Rt LE that are red and have slough in them without drainage. Therapist asked pt to look at her nails which are long, educated that there is multiple  bacteria under the nailbeds and rarely when we wash our hands do we cleanse under the nails.  Therapist recommended to pt that she 1) obtain a dermatologist appointment, cut her nails and wear mittens to bed to prevent further scratching.  Due to dry, red wounds therapist used a light layer(4x4 unfolded and 1/2 kerlix ) covering to keep moisture on legs.  Therapist will continue to see pt until pt is able to get to the dermatologist at this time we will discharge as we are not progressing in clearing up the pt wounds.  OBJECTIVE IMPAIRMENTS: Abnormal gait, decreased activity tolerance, decreased balance, decreased mobility, difficulty walking, impaired sensation, and improper body mechanics.   ACTIVITY LIMITATIONS: carrying, lifting, standing, squatting, stairs, transfers, locomotion level, and caring for others  PARTICIPATION LIMITATIONS: meal prep, cleaning, laundry, shopping, community activity, and yard work  PERSONAL FACTORS: 3+  comorbidities: DM, CKD, HLD, HTN  are also affecting patient's functional outcome.   REHAB POTENTIAL: Good  CLINICAL DECISION MAKING: Evolving/moderate complexity  EVALUATION COMPLEXITY: Moderate  PLAN: PT FREQUENCY: 1 x per week   PT DURATION: 4 weeks or until pt has dermatologist appointment.  If pt is returning to dermatologist on a regular basis pt may be discharged.   PLANNED INTERVENTIONS: Therapeutic exercises, Therapeutic activity, Neuromuscular re-education, Balance training, Gait training, Patient/Family education, Joint manipulation, Joint mobilization, Stair training, Orthotic/Fit training, DME instructions, Aquatic Therapy, Dry Needling, Electrical stimulation, Spinal manipulation, Spinal mobilization, Cryotherapy, Moist heat, Compression bandaging, scar mobilization, Splintting, Taping, Traction, Ultrasound, Ionotophoresis 4mg /ml Dexamethasone, and Manual therapy   PLAN FOR NEXT SESSION: Continue to  asess how nystatin creme does to improve skin integrity.  Assess how no wrap, just cream and moisturizer done next session.  Return to wrap if needed.  Continue X 4 more weeks and if not completely healed in this time refer back to MD.  Virgina Organ, PT CLT [phone]  05/14/2023, 11:02 AM

## 2023-05-21 ENCOUNTER — Ambulatory Visit (HOSPITAL_COMMUNITY): Payer: Medicare HMO | Admitting: Physical Therapy

## 2023-05-21 DIAGNOSIS — S81801A Unspecified open wound, right lower leg, initial encounter: Secondary | ICD-10-CM | POA: Diagnosis not present

## 2023-05-21 DIAGNOSIS — L97519 Non-pressure chronic ulcer of other part of right foot with unspecified severity: Secondary | ICD-10-CM | POA: Diagnosis not present

## 2023-05-21 DIAGNOSIS — E11621 Type 2 diabetes mellitus with foot ulcer: Secondary | ICD-10-CM | POA: Diagnosis not present

## 2023-05-21 DIAGNOSIS — S81802S Unspecified open wound, left lower leg, sequela: Secondary | ICD-10-CM | POA: Diagnosis not present

## 2023-05-21 DIAGNOSIS — R2689 Other abnormalities of gait and mobility: Secondary | ICD-10-CM | POA: Diagnosis not present

## 2023-05-21 NOTE — Therapy (Signed)
OUTPATIENT PHYSICAL THERAPY Wound Treatment      Patient Name: Krista Payne MRN: [id number] DOB:19-Dec-1940, 82 y.o., female Today's Date: 05/21/2023    PCP: Benita Stabile, MD REFERRING PROVIDER: Benita Stabile, MD  END OF SESSION:   PT End of Session - 05/21/23 1103     Visit Number 41    Number of Visits 44    Date for PT Re-Evaluation 06/13/23    Authorization Type Primary: [employer] Medicare Secondary: Tricare (no auth, no VL)    Progress Note Due on Visit 44    Activity Tolerance Patient tolerated treatment well    Behavior During Therapy [facility] for tasks assessed/performed                   Past Medical History:  Diagnosis Date   Breast cancer (HCC)    R   Chronic kidney disease    Diabetes mellitus without complication (HCC)    Hyperlipidemia    Hypertension    Macular degeneration    Osteoarthritis    Past Surgical History:  Procedure Laterality Date   CATARACT EXTRACTION W/PHACO Right 02/07/2015   Procedure: CATARACT EXTRACTION PHACO AND INTRAOCULAR LENS PLACEMENT (IOC);  Surgeon: Gemma Payor, MD;  Location: [facility];  Service: Ophthalmology;  Laterality: Right;  CDE:12.72   CATARACT EXTRACTION W/PHACO Left 02/24/2015   Procedure: CATARACT EXTRACTION PHACO AND INTRAOCULAR LENS PLACEMENT LEFT EYE;  Surgeon: Gemma Payor, MD;  Location: [facility];  Service: Ophthalmology;  Laterality: Left;  CDE 11.69   COLONOSCOPY     Patient Active Problem List   Diagnosis Date Noted   Unspecified Escherichia coli (E. coli) as the cause of diseases classified elsewhere 02/23/2022   Sepsis (HCC) 02/22/2022   Acute kidney failure (HCC) 02/22/2022   Fall at home, initial encounter 02/22/2022   Personal history of COVID-19 02/02/2022   Pressure injury of skin 11/15/2021   Depression, unspecified 11/15/2021   Hypertensive heart and chronic kidney disease with heart failure and stage 1 through stage 4 chronic kidney disease, or unspecified chronic kidney disease (HCC) 11/15/2021    Insomnia 11/15/2021   Muscle weakness (generalized) 11/15/2021   Other abnormalities of gait and mobility 11/15/2021   Other forms of acute ischemic heart disease 11/15/2021   Traumatic ischemia of muscle (HCC) 11/15/2021   Diastolic heart failure (HCC) 11/15/2021   Diabetic foot infection (HCC) 11/14/2021   Acute metabolic encephalopathy 11/14/2021   Chronic kidney disease, stage 3b (HCC) 11/14/2021   Type 2 diabetes with nephropathy (HCC) 11/14/2021   Elevated troponin 11/13/2021   Lactic acidosis 11/13/2021   COVID-19 virus infection 11/13/2021   Hyperglycemia due to diabetes mellitus (HCC) 11/13/2021   Ankle wound, right, initial encounter 11/13/2021   Altered mental status 11/13/2021   Dehydration 11/13/2021   Long term (current) use of oral hypoglycemic drugs 10/29/2021   Unspecified macular degeneration 10/29/2021   Chronic kidney disease 05/17/2021   Personal history of malignant neoplasm of breast 10/29/2020   Malignant neoplasm of unspecified site of right female breast (HCC) 07/16/2019   URI (upper respiratory infection) 11/20/2011   Other seasonal allergic rhinitis 11/23/2010   Urinary tract infection without hematuria 11/23/2010   RENAL INSUFFICIENCY 10/26/2010   OSTEOARTHRITIS 10/18/2010   Osteoarthritis 10/18/2010   Vitamin D deficiency 08/10/2009   Hyperlipidemia 08/10/2009   Body mass index (BMI) 39.0-39.9, adult 08/10/2009   EDEMA 01/20/2008   Anemia in chronic kidney disease 06/19/2007   Hypertension 06/02/2007    ONSET DATE: 07/05/22  REFERRING  DIAG: ulcer of right foot due to type 2 diabetes mellitus  THERAPY DIAG:  Multiple open wounds of right lower leg  Multiple open wounds of lower leg, left, sequela  Rationale for Evaluation and Treatment: Rehabilitation  Wound Therapy - 05/21/23 0001     Subjective PT states that she has no pain.  She has a dermatology appointment but can not state who with or when.    Patient and Family Stated Goals wound to  heal    Date of Onset 07/05/22    Prior Treatments cream/antibiotyic    Pain Scale 0-10    Pain Score 0-No pain    Evaluation and Treatment Procedures Explained to Patient/Family Yes    Evaluation and Treatment Procedures agreed to    Wound Properties Date First Assessed: 10/04/22 Time First Assessed: 1345 Wound Type: Venous stasis ulcer Location: Pretibial Location Orientation: Distal;Right Wound Description (Comments): medial ankle wound superior Present on Admission: Yes   Dressing Type --   nystatin, vaseline, no dressing   Dressing Status None   no dressings applied   Dressing Change Frequency PRN    Site / Wound Assessment Red;Dry;Pink;Yellow    % Wound base Red or Granulating 50%    Peri-wound Assessment Erythema (blanchable);Edema;Induration    Wound Length (cm) 1.8 cm    Wound Width (cm) 1 cm    Wound Depth (cm) 0.3 cm    Wound Volume (cm^3) 0.54 cm^3    Wound Surface Area (cm^2) 1.8 cm^2    Drainage Amount Minimal    Treatment Cleansed;Debridement (Selective)    Selective Debridement (non-excisional) - Location wound bed/ dry skin    Selective Debridement (non-excisional) - Tools Used Forceps    Selective Debridement (non-excisional) - Tissue Removed devitialized tissue, slough and dry skin    Wound Therapy - Clinical Statement see below    Wound Therapy - Functional Problem List bathing, dressing    Factors Delaying/Impairing Wound Healing Altered sensation;Diabetes Mellitus;Vascular compromise;Multiple medical problems    Hydrotherapy Plan Dressing change;Patient/family education    Wound Therapy - Frequency 2X / week   requesting 4 more weeks, hopefully pt can get in to see a dermatolagist by this time.   Wound Therapy - Current Recommendations PT    Wound Plan see below    Dressing  nystatin, vaseline, 4x4 , light kerlix and netting   medihoney into wound bed followed by 4x4                  Manual completed for improved circulation/edema  PATIENT  EDUCATION: Education details: 01/01/23:  Contact MD and return due to wounds are not healing. 11/21/22, POC, changing dressing if soiled; EVAL Patient educated on exam findings, POC, scope of PT, HEP, and keeping dressing on unless soiled or panful. Person educated: Patient Education method: Explanation, Demonstration, and Handouts Education comprehension: verbalized understanding, returned demonstration, verbal cues required, and tactile cues required   HOME EXERCISE PROGRAM: N/a   GOALS: Goals reviewed with patient? Yes  SHORT TERM GOALS: Target date: 10/25/2022    Wound to be free from slough to demonstrate healing.  Baseline: Goal status: MET   LONG TERM GOALS: Target date: 11/15/2022 extended to 06/13/23)    Patient wound to be healed to reduce risk of infection. Baseline:  Goal status: IN PROGRESS  2.  Patient will state importance of compression garment use and be able to don to reduce risk of further wound development.  Baseline:  Goal status: IN PROGRESS   ASSESSMENT:  CLINICAL IMPRESSION:  Pt has kept the dressing on.  Most of the scratches are healed with four remaining shallow wounds.  A wound is present just superior to her medial ankle which is painful to debride therefore therapist used medihoney to attempt to lossen the slough. . Continued with dressing due to new wound on medial area and LE on the whole improving with scratch marks.   Therapist will continue to see pt until pt is able to get to the dermatologist at this time we will discharge as we are not progressing in clearing up the pt wounds.  OBJECTIVE IMPAIRMENTS: Abnormal gait, decreased activity tolerance, decreased balance, decreased mobility, difficulty walking, impaired sensation, and improper body mechanics.   ACTIVITY LIMITATIONS: carrying, lifting, standing, squatting, stairs, transfers, locomotion level, and caring for others  PARTICIPATION LIMITATIONS: meal prep, cleaning, laundry, shopping,  community activity, and yard work  PERSONAL FACTORS: 3+ comorbidities: DM, CKD, HLD, HTN  are also affecting patient's functional outcome.   REHAB POTENTIAL: Good  CLINICAL DECISION MAKING: Evolving/moderate complexity  EVALUATION COMPLEXITY: Moderate  PLAN: PT FREQUENCY: 1 x per week   PT DURATION: 4 weeks or until pt has dermatologist appointment.  If pt is returning to dermatologist on a regular basis pt may be discharged.   PLANNED INTERVENTIONS: Therapeutic exercises, Therapeutic activity, Neuromuscular re-education, Balance training, Gait training, Patient/Family education, Joint manipulation, Joint mobilization, Stair training, Orthotic/Fit training, DME instructions, Aquatic Therapy, Dry Needling, Electrical stimulation, Spinal manipulation, Spinal mobilization, Cryotherapy, Moist heat, Compression bandaging, scar mobilization, Splintting, Taping, Traction, Ultrasound, Ionotophoresis 4mg /ml Dexamethasone, and Manual therapy   PLAN FOR NEXT SESSION: Continue to  asess how nystatin creme does to improve skin integrity.  Assess how no wrap, just cream and moisturizer done next session.  Return to wrap if needed.  Continue X 4 more weeks and if not completely healed in this time refer back to MD.  Virgina Organ, PT CLT [phone]  05/21/2023, 11:11 AM

## 2023-05-28 ENCOUNTER — Encounter (HOSPITAL_COMMUNITY): Payer: Self-pay

## 2023-05-28 ENCOUNTER — Ambulatory Visit (HOSPITAL_COMMUNITY): Payer: Medicare HMO

## 2023-05-28 DIAGNOSIS — M79671 Pain in right foot: Secondary | ICD-10-CM | POA: Diagnosis not present

## 2023-05-28 DIAGNOSIS — L97519 Non-pressure chronic ulcer of other part of right foot with unspecified severity: Secondary | ICD-10-CM | POA: Diagnosis not present

## 2023-05-28 DIAGNOSIS — L110 Acquired keratosis follicularis: Secondary | ICD-10-CM | POA: Diagnosis not present

## 2023-05-28 DIAGNOSIS — S81801A Unspecified open wound, right lower leg, initial encounter: Secondary | ICD-10-CM

## 2023-05-28 DIAGNOSIS — M79672 Pain in left foot: Secondary | ICD-10-CM | POA: Diagnosis not present

## 2023-05-28 DIAGNOSIS — I739 Peripheral vascular disease, unspecified: Secondary | ICD-10-CM | POA: Diagnosis not present

## 2023-05-28 DIAGNOSIS — S81802S Unspecified open wound, left lower leg, sequela: Secondary | ICD-10-CM | POA: Diagnosis not present

## 2023-05-28 DIAGNOSIS — R2689 Other abnormalities of gait and mobility: Secondary | ICD-10-CM | POA: Diagnosis not present

## 2023-05-28 DIAGNOSIS — M79675 Pain in left toe(s): Secondary | ICD-10-CM | POA: Diagnosis not present

## 2023-05-28 DIAGNOSIS — E11621 Type 2 diabetes mellitus with foot ulcer: Secondary | ICD-10-CM | POA: Diagnosis not present

## 2023-05-28 DIAGNOSIS — M79674 Pain in right toe(s): Secondary | ICD-10-CM | POA: Diagnosis not present

## 2023-05-28 NOTE — Therapy (Signed)
OUTPATIENT PHYSICAL THERAPY Wound Treatment      Patient Name: Krista Payne MRN: [id number] DOB:[birth year], 82 y.o., female Today's Date: 05/28/2023    PCP: Benita Stabile, MD REFERRING PROVIDER: Benita Stabile, MD  END OF SESSION:   PT End of Session - 05/28/23 1124     Visit Number 42    Number of Visits 44    Date for PT Re-Evaluation 06/13/23    Authorization Type Primary: [employer]etna Medicare Secondary: Tricare (no auth, no VL)    Progress Note Due on Visit 44    PT Start Time 1053    PT Stop Time 1115    PT Time Calculation (min) 22 min    Activity Tolerance Patient tolerated treatment well    Behavior During Therapy [facility] for tasks assessed/performed                   Past Medical History:  Diagnosis Date   Breast cancer (HCC)    R   Chronic kidney disease    Diabetes mellitus without complication (HCC)    Hyperlipidemia    Hypertension    Macular degeneration    Osteoarthritis    Past Surgical History:  Procedure Laterality Date   CATARACT EXTRACTION W/PHACO Right 02/07/2015   Procedure: CATARACT EXTRACTION PHACO AND INTRAOCULAR LENS PLACEMENT (IOC);  Surgeon: Gemma Payor, MD;  Location: [facility];  Service: Ophthalmology;  Laterality: Right;  CDE:12.72   CATARACT EXTRACTION W/PHACO Left 02/24/2015   Procedure: CATARACT EXTRACTION PHACO AND INTRAOCULAR LENS PLACEMENT LEFT EYE;  Surgeon: Gemma Payor, MD;  Location: [facility];  Service: Ophthalmology;  Laterality: Left;  CDE 11.69   COLONOSCOPY     Patient Active Problem List   Diagnosis Date Noted   Unspecified Escherichia coli (E. coli) as the cause of diseases classified elsewhere 02/23/2022   Sepsis (HCC) 02/22/2022   Acute kidney failure (HCC) 02/22/2022   Fall at home, initial encounter 02/22/2022   Personal history of COVID-19 02/02/2022   Pressure injury of skin 11/15/2021   Depression, unspecified 11/15/2021   Hypertensive heart and chronic kidney disease with heart failure and stage 1 through stage 4  chronic kidney disease, or unspecified chronic kidney disease (HCC) 11/15/2021   Insomnia 11/15/2021   Muscle weakness (generalized) 11/15/2021   Other abnormalities of gait and mobility 11/15/2021   Other forms of acute ischemic heart disease 11/15/2021   Traumatic ischemia of muscle (HCC) 11/15/2021   Diastolic heart failure (HCC) 11/15/2021   Diabetic foot infection (HCC) 11/14/2021   Acute metabolic encephalopathy 11/14/2021   Chronic kidney disease, stage 3b (HCC) 11/14/2021   Type 2 diabetes with nephropathy (HCC) 11/14/2021   Elevated troponin 11/13/2021   Lactic acidosis 11/13/2021   COVID-19 virus infection 11/13/2021   Hyperglycemia due to diabetes mellitus (HCC) 11/13/2021   Ankle wound, right, initial encounter 11/13/2021   Altered mental status 11/13/2021   Dehydration 11/13/2021   Long term (current) use of oral hypoglycemic drugs 10/29/2021   Unspecified macular degeneration 10/29/2021   Chronic kidney disease 05/17/2021   Personal history of malignant neoplasm of breast 10/29/2020   Malignant neoplasm of unspecified site of right female breast (HCC) 07/16/2019   URI (upper respiratory infection) 11/20/2011   Other seasonal allergic rhinitis 11/23/2010   Urinary tract infection without hematuria 11/23/2010   RENAL INSUFFICIENCY 10/26/2010   OSTEOARTHRITIS 10/18/2010   Osteoarthritis 10/18/2010   Vitamin D deficiency 08/10/2009   Hyperlipidemia 08/10/2009   Body mass index (BMI) 39.0-39.9, adult 08/10/2009  EDEMA 01/20/2008   Anemia in chronic kidney disease 06/19/2007   Hypertension 06/02/2007    ONSET DATE: 07/05/22  REFERRING DIAG: ulcer of right foot due to type 2 diabetes mellitus  THERAPY DIAG:  Multiple open wounds of right lower leg  Rationale for Evaluation and Treatment: Rehabilitation  Wound Therapy - 05/28/23 0001     Subjective Pt arrived with dressings intact.  Reports she has poditrist apt later today and seeing dermotologist on 06/13/23.  No  reports of pain and hasn't been scratching.    Patient and Family Stated Goals wound to heal    Date of Onset 07/05/22    Prior Treatments cream/antibiotyic    Pain Scale 0-10    Pain Score 0-No pain    Evaluation and Treatment Procedures Explained to Patient/Family Yes    Evaluation and Treatment Procedures agreed to    Wound Properties Date First Assessed: 10/04/22 Time First Assessed: 1345 Wound Type: Venous stasis ulcer Location: Pretibial Location Orientation: Distal;Right Wound Description (Comments): medial ankle wound superior Present on Admission: Yes   Wound Image Images linked: 1    Dressing Type --   nystatin, vaseline, 4x4 , light kerlix and netting with medihoney and 2x2 on medial wound   Dressing Changed Changed    Dressing Status Old drainage    Dressing Change Frequency PRN    Site / Wound Assessment Red;Dry;Pink;Yellow    % Wound base Red or Granulating 50%    % Wound base Yellow/Fibrinous Exudate 50%    Peri-wound Assessment Erythema (blanchable);Edema;Induration    Wound Length (cm) 1.5 cm    Wound Width (cm) 1 cm    Wound Depth (cm) 0.3 cm    Wound Volume (cm^3) 0.45 cm^3    Wound Surface Area (cm^2) 1.5 cm^2    Drainage Amount Minimal    Treatment Cleansed;Debridement (Selective)    Selective Debridement (non-excisional) - Location wound bed/ dry skin    Selective Debridement (non-excisional) - Tools Used Forceps;Scalpel    Selective Debridement (non-excisional) - Tissue Removed devitialized tissue, slough and dry skin    Wound Therapy - Clinical Statement see below    Wound Therapy - Functional Problem List bathing, dressing    Factors Delaying/Impairing Wound Healing Altered sensation;Diabetes Mellitus;Vascular compromise;Multiple medical problems    Hydrotherapy Plan Dressing change;Patient/family education    Wound Therapy - Frequency 2X / week   requesting 4 more weeks, hopefully pt can get in to see a dermatolagist by this time.   Wound Therapy - Current  Recommendations PT    Wound Plan see below    Dressing  nystatin, vaseline, 4x4 , light kerlix and netting   medihoney on medial wound                  Manual completed for improved circulation/edema  PATIENT EDUCATION: Education details: 01/01/23:  Contact MD and return due to wounds are not healing. 11/21/22, POC, changing dressing if soiled; EVAL Patient educated on exam findings, POC, scope of PT, HEP, and keeping dressing on unless soiled or panful. Person educated: Patient Education method: Explanation, Demonstration, and Handouts Education comprehension: verbalized understanding, returned demonstration, verbal cues required, and tactile cues required   HOME EXERCISE PROGRAM: N/a   GOALS: Goals reviewed with patient? Yes  SHORT TERM GOALS: Target date: 10/25/2022    Wound to be free from slough to demonstrate healing.  Baseline: Goal status: MET   LONG TERM GOALS: Target date: 11/15/2022 extended to 06/13/23)    Patient wound to  be healed to reduce risk of infection. Baseline:  Goal status: IN PROGRESS  2.  Patient will state importance of compression garment use and be able to don to reduce risk of further wound development.  Baseline:  Goal status: IN PROGRESS   ASSESSMENT:  CLINICAL IMPRESSION:  Pt arrived with dressings intact.  Upon removal of dressings only medial wound left.  Noted bottle neck and redness above dressings which were traced this session to assess next session.  Selective debridement for removal of slough in wound bed and significant amount of dry skin.  Continued with medihoney on medial wound and kerlix with netting.  Pt with podiatrist apt later today to address long toe nails.   OBJECTIVE IMPAIRMENTS: Abnormal gait, decreased activity tolerance, decreased balance, decreased mobility, difficulty walking, impaired sensation, and improper body mechanics.   ACTIVITY LIMITATIONS: carrying, lifting, standing, squatting, stairs, transfers,  locomotion level, and caring for others  PARTICIPATION LIMITATIONS: meal prep, cleaning, laundry, shopping, community activity, and yard work  PERSONAL FACTORS: 3+ comorbidities: DM, CKD, HLD, HTN  are also affecting patient's functional outcome.   REHAB POTENTIAL: Good  CLINICAL DECISION MAKING: Evolving/moderate complexity  EVALUATION COMPLEXITY: Moderate  PLAN: PT FREQUENCY: 1 x per week   PT DURATION: 4 weeks or until pt has dermatologist appointment.  If pt is returning to dermatologist on a regular basis pt may be discharged.   PLANNED INTERVENTIONS: Therapeutic exercises, Therapeutic activity, Neuromuscular re-education, Balance training, Gait training, Patient/Family education, Joint manipulation, Joint mobilization, Stair training, Orthotic/Fit training, DME instructions, Aquatic Therapy, Dry Needling, Electrical stimulation, Spinal manipulation, Spinal mobilization, Cryotherapy, Moist heat, Compression bandaging, scar mobilization, Splintting, Taping, Traction, Ultrasound, Ionotophoresis 4mg /ml Dexamethasone, and Manual therapy   PLAN FOR NEXT SESSION: Continue to  asess how nystatin creme does to improve skin integrity.  Assess how no wrap, just cream and moisturizer done next session.  Return to wrap if needed.  Continue X 4 more weeks and if not completely healed in this time refer back to MD.  Becky Sax, LPTA/CLT; CBIS [phone]  Juel Burrow, PTA 05/28/2023, 12:23 PM  05/28/2023, 11:25 AM

## 2023-05-29 DIAGNOSIS — N2581 Secondary hyperparathyroidism of renal origin: Secondary | ICD-10-CM | POA: Diagnosis not present

## 2023-05-29 DIAGNOSIS — I5032 Chronic diastolic (congestive) heart failure: Secondary | ICD-10-CM | POA: Diagnosis not present

## 2023-05-29 DIAGNOSIS — I129 Hypertensive chronic kidney disease with stage 1 through stage 4 chronic kidney disease, or unspecified chronic kidney disease: Secondary | ICD-10-CM | POA: Diagnosis not present

## 2023-05-29 DIAGNOSIS — D638 Anemia in other chronic diseases classified elsewhere: Secondary | ICD-10-CM | POA: Diagnosis not present

## 2023-05-29 DIAGNOSIS — E611 Iron deficiency: Secondary | ICD-10-CM | POA: Diagnosis not present

## 2023-05-29 DIAGNOSIS — E1129 Type 2 diabetes mellitus with other diabetic kidney complication: Secondary | ICD-10-CM | POA: Diagnosis not present

## 2023-05-29 DIAGNOSIS — E1122 Type 2 diabetes mellitus with diabetic chronic kidney disease: Secondary | ICD-10-CM | POA: Diagnosis not present

## 2023-06-05 DIAGNOSIS — D638 Anemia in other chronic diseases classified elsewhere: Secondary | ICD-10-CM | POA: Diagnosis not present

## 2023-06-05 DIAGNOSIS — E875 Hyperkalemia: Secondary | ICD-10-CM | POA: Diagnosis not present

## 2023-06-05 DIAGNOSIS — I5032 Chronic diastolic (congestive) heart failure: Secondary | ICD-10-CM | POA: Diagnosis not present

## 2023-06-05 DIAGNOSIS — E1122 Type 2 diabetes mellitus with diabetic chronic kidney disease: Secondary | ICD-10-CM | POA: Diagnosis not present

## 2023-06-10 DIAGNOSIS — E1165 Type 2 diabetes mellitus with hyperglycemia: Secondary | ICD-10-CM | POA: Diagnosis not present

## 2023-06-11 ENCOUNTER — Ambulatory Visit (HOSPITAL_COMMUNITY)
Admission: RE | Admit: 2023-06-11 | Discharge: 2023-06-11 | Disposition: A | Discharge status: Home / Self Care | Payer: Medicare HMO | Source: Ambulatory Visit | Attending: Hematology | Admitting: Hematology

## 2023-06-11 ENCOUNTER — Inpatient Hospital Stay: Payer: Medicare HMO | Attending: Hematology

## 2023-06-11 ENCOUNTER — Encounter (HOSPITAL_COMMUNITY): Payer: Self-pay

## 2023-06-11 DIAGNOSIS — M8580 Other specified disorders of bone density and structure, unspecified site: Secondary | ICD-10-CM | POA: Diagnosis not present

## 2023-06-11 DIAGNOSIS — C50911 Malignant neoplasm of unspecified site of right female breast: Secondary | ICD-10-CM | POA: Insufficient documentation

## 2023-06-11 DIAGNOSIS — N189 Chronic kidney disease, unspecified: Secondary | ICD-10-CM | POA: Insufficient documentation

## 2023-06-11 DIAGNOSIS — Z79811 Long term (current) use of aromatase inhibitors: Secondary | ICD-10-CM | POA: Insufficient documentation

## 2023-06-11 DIAGNOSIS — D631 Anemia in chronic kidney disease: Secondary | ICD-10-CM | POA: Insufficient documentation

## 2023-06-11 DIAGNOSIS — Z170 Estrogen receptor positive status [ER+]: Secondary | ICD-10-CM | POA: Insufficient documentation

## 2023-06-11 DIAGNOSIS — E559 Vitamin D deficiency, unspecified: Secondary | ICD-10-CM

## 2023-06-11 DIAGNOSIS — N6312 Unspecified lump in the right breast, upper inner quadrant: Secondary | ICD-10-CM | POA: Diagnosis not present

## 2023-06-11 DIAGNOSIS — D509 Iron deficiency anemia, unspecified: Secondary | ICD-10-CM | POA: Insufficient documentation

## 2023-06-11 DIAGNOSIS — C50311 Malignant neoplasm of lower-inner quadrant of right female breast: Secondary | ICD-10-CM | POA: Insufficient documentation

## 2023-06-11 DIAGNOSIS — Z853 Personal history of malignant neoplasm of breast: Secondary | ICD-10-CM | POA: Insufficient documentation

## 2023-06-11 DIAGNOSIS — D508 Other iron deficiency anemias: Secondary | ICD-10-CM

## 2023-06-11 DIAGNOSIS — R92321 Mammographic fibroglandular density, right breast: Secondary | ICD-10-CM | POA: Diagnosis not present

## 2023-06-11 DIAGNOSIS — N6314 Unspecified lump in the right breast, lower inner quadrant: Secondary | ICD-10-CM | POA: Diagnosis not present

## 2023-06-11 LAB — CBC WITH DIFFERENTIAL/PLATELET
Abs Immature Granulocytes: 0.05 10*3/uL (ref 0.00–0.07)
Basophils Absolute: 0.1 10*3/uL (ref 0.0–0.1)
Basophils Relative: 1 %
Eosinophils Absolute: 0.5 10*3/uL (ref 0.0–0.5)
Eosinophils Relative: 5 %
HCT: 37.4 % (ref 36.0–46.0)
Hemoglobin: 11.8 g/dL — ABNORMAL LOW (ref 12.0–15.0)
Immature Granulocytes: 1 %
Lymphocytes Relative: 10 %
Lymphs Abs: 1 10*3/uL (ref 0.7–4.0)
MCH: 28.9 pg (ref 26.0–34.0)
MCHC: 31.6 g/dL (ref 30.0–36.0)
MCV: 91.4 fL (ref 80.0–100.0)
Monocytes Absolute: 0.8 10*3/uL (ref 0.1–1.0)
Monocytes Relative: 7 %
Neutro Abs: 8.1 10*3/uL — ABNORMAL HIGH (ref 1.7–7.7)
Neutrophils Relative %: 76 %
Platelets: 415 10*3/uL — ABNORMAL HIGH (ref 150–400)
RBC: 4.09 MIL/uL (ref 3.87–5.11)
RDW: 13.3 % (ref 11.5–15.5)
WBC: 10.5 10*3/uL (ref 4.0–10.5)
nRBC: 0 % (ref 0.0–0.2)

## 2023-06-11 LAB — COMPREHENSIVE METABOLIC PANEL
ALT: 14 U/L (ref 0–44)
AST: 15 U/L (ref 15–41)
Albumin: 3.8 g/dL (ref 3.5–5.0)
Alkaline Phosphatase: 72 U/L (ref 38–126)
Anion gap: 11 (ref 5–15)
BUN: 51 mg/dL — ABNORMAL HIGH (ref 8–23)
CO2: 23 mmol/L (ref 22–32)
Calcium: 10.2 mg/dL (ref 8.9–10.3)
Chloride: 99 mmol/L (ref 98–111)
Creatinine, Ser: 1.94 mg/dL — ABNORMAL HIGH (ref 0.44–1.00)
GFR, Estimated: 25 mL/min — ABNORMAL LOW (ref >60)
Glucose, Bld: 202 mg/dL — ABNORMAL HIGH (ref 70–99)
Potassium: 5.2 mmol/L — ABNORMAL HIGH (ref 3.5–5.1)
Sodium: 133 mmol/L — ABNORMAL LOW (ref 135–145)
Total Bilirubin: 0.4 mg/dL (ref 0.3–1.2)
Total Protein: 7.7 g/dL (ref 6.5–8.1)

## 2023-06-11 LAB — VITAMIN D 25 HYDROXY (VIT D DEFICIENCY, FRACTURES): Vit D, 25-Hydroxy: 33.13 ng/mL (ref 30–100)

## 2023-06-11 LAB — FERRITIN: Ferritin: 50 ng/mL (ref 11–307)

## 2023-06-11 LAB — IRON AND TIBC
Iron: 57 ug/dL (ref 28–170)
Saturation Ratios: 14 % (ref 10.4–31.8)
TIBC: 402 ug/dL (ref 250–450)
UIBC: 345 ug/dL

## 2023-06-12 ENCOUNTER — Other Ambulatory Visit: Payer: Self-pay | Admitting: Nurse Practitioner

## 2023-06-14 ENCOUNTER — Other Ambulatory Visit: Payer: Self-pay

## 2023-06-17 ENCOUNTER — Ambulatory Visit (INDEPENDENT_AMBULATORY_CARE_PROVIDER_SITE_OTHER): Payer: Medicare HMO | Admitting: Nurse Practitioner

## 2023-06-17 ENCOUNTER — Encounter: Payer: Self-pay | Admitting: Nurse Practitioner

## 2023-06-17 VITALS — BP 144/80 | HR 65 | Ht 63.0 in | Wt 201.4 lb

## 2023-06-17 DIAGNOSIS — E1122 Type 2 diabetes mellitus with diabetic chronic kidney disease: Secondary | ICD-10-CM

## 2023-06-17 DIAGNOSIS — Z7985 Long-term (current) use of injectable non-insulin antidiabetic drugs: Secondary | ICD-10-CM

## 2023-06-17 DIAGNOSIS — Z794 Long term (current) use of insulin: Secondary | ICD-10-CM

## 2023-06-17 DIAGNOSIS — N184 Chronic kidney disease, stage 4 (severe): Secondary | ICD-10-CM | POA: Diagnosis not present

## 2023-06-17 LAB — POCT GLYCOSYLATED HEMOGLOBIN (HGB A1C): Hemoglobin A1C: 10.5 % — AB (ref 4.0–5.6)

## 2023-06-17 NOTE — Progress Notes (Signed)
Endocrinology Follow Up Note       06/17/2023, 11:00 AM   Subjective:    Patient ID: Krista Payne, female    DOB: [birth year].  Krista Payne is being seen in follow up after being seen in consultation for management of currently uncontrolled symptomatic diabetes requested by  Benita Stabile, MD.   Past Medical History:  Diagnosis Date   Breast cancer [facility]) 2020   lobular cancer right breast   Chronic kidney disease    Diabetes mellitus without complication (HCC)    Hyperlipidemia    Hypertension    Macular degeneration    Osteoarthritis     Past Surgical History:  Procedure Laterality Date   BREAST BIOPSY Right 2020   lobular ca   CATARACT EXTRACTION W/PHACO Right 02/07/2015   Procedure: CATARACT EXTRACTION PHACO AND INTRAOCULAR LENS PLACEMENT (IOC);  Surgeon: Gemma Payor, MD;  Location: [facility];  Service: Ophthalmology;  Laterality: Right;  CDE:12.72   CATARACT EXTRACTION W/PHACO Left 02/24/2015   Procedure: CATARACT EXTRACTION PHACO AND INTRAOCULAR LENS PLACEMENT LEFT EYE;  Surgeon: Gemma Payor, MD;  Location: [facility];  Service: Ophthalmology;  Laterality: Left;  CDE 11.69   COLONOSCOPY      Social History   Socioeconomic History   Marital status: Widowed    Spouse name: Not on file   Number of children: 3   Years of education: 82   Highest education level: 12th grade  Occupational History   Not on file  Tobacco Use   Smoking status: Never    Passive exposure: Never   Smokeless tobacco: Never  Vaping Use   Vaping status: Never Used  Substance and Sexual Activity   Alcohol use: No   Drug use: No   Sexual activity: Not Currently    Partners: Male  Other Topics Concern   Not on file  Social History Narrative   Not on file   Social Determinants of Health   Financial Resource Strain: Low Risk  (07/19/2022)   Overall Financial Resource Strain (CARDIA)    Difficulty of Paying Living Expenses:  Not hard at all  Food Insecurity: No Food Insecurity (07/19/2022)   Hunger Vital Sign    Worried About Running Out of Food in the Last Year: Never true    Ran Out of Food in the Last Year: Never true  Transportation Needs: No Transportation Needs (07/19/2022)   PRAPARE - Administrator, Civil Service (Medical): No    Lack of Transportation (Non-Medical): No  Physical Activity: Inactive (07/19/2022)   Exercise Vital Sign    Days of Exercise per Week: 0 days    Minutes of Exercise per Session: 0 min  Stress: No Stress Concern Present (07/19/2022)   [employer] of Occupational Health - Occupational Stress Questionnaire    Feeling of Stress : Not at all  Social Connections: Moderately Integrated (07/19/2022)   Social Connection and Isolation Panel [NHANES]    Frequency of Communication with Friends and Family: More than three times a week    Frequency of Social Gatherings with Friends and Family: More than three times a week    Attends Religious Services: More than 4 times per  year    Active Member of Clubs or Organizations: Yes    Attends Banker Meetings: More than 4 times per year    Marital Status: Widowed    Family History  Problem Relation Age of Onset   Macular degeneration Mother    Diabetes Father    Heart disease Daughter     Outpatient Encounter Medications as of 06/17/2023  Medication Sig   amLODipine (NORVASC) 5 MG tablet Take 0.5 tablets (2.5 mg total) by mouth daily.   anastrozole (ARIMIDEX) 1 MG tablet Take 1 tablet (1 mg total) by mouth daily.   atenolol (TENORMIN) 25 MG tablet Take 25 mg by mouth daily.   buPROPion (WELLBUTRIN XL) 150 MG 24 hr tablet Take 300 mg by mouth daily as needed (feeling moody/depressed).   Dulaglutide (TRULICITY) 1.5 MG/0.5ML SOPN INJECT 1.5 MG UNDER THE SKIN ONCE A WEEK   furosemide (LASIX) 20 MG tablet Take 10 mg by mouth daily as needed for fluid.   insulin glargine (LANTUS) 100 UNIT/ML injection Inject 0.16  mLs (16 Units total) into the skin at bedtime.   Insulin Pen Needle (PEN NEEDLES) 31G X 6 MM MISC Use to inject insulin once daily   mupirocin ointment (BACTROBAN) 2 % SMARTSIG:sparingly Topical 3 Times Daily   olmesartan (BENICAR) 5 MG tablet Take 5 mg by mouth daily.   simvastatin (ZOCOR) 20 MG tablet Take 1 tablet (20 mg total) by mouth at bedtime.   [DISCONTINUED] Dulaglutide (TRULICITY) 0.75 MG/0.5ML SOPN Inject 0.75 mg into the skin once a week. (Patient not taking: Reported on 06/17/2023)   [DISCONTINUED] Dulaglutide (TRULICITY) 1.5 MG/0.5ML SOPN Inject 1.5 mg into the skin once a week.   No facility-administered encounter medications on file as of 06/17/2023.    ALLERGIES: No Known Allergies  VACCINATION STATUS: Immunization History  Administered Date(s) Administered   Influenza Split 11/13/2011   Influenza Whole 07/20/2008, 08/10/2009, 10/18/2010   Influenza-Unspecified 07/29/2013, 08/12/2020   Pneumococcal Polysaccharide-23 07/20/2008   Td 07/29/1998, 08/10/2009    Diabetes She presents for her follow-up diabetic visit. She has type 2 diabetes mellitus. Her disease course has been improving. There are no hypoglycemic associated symptoms. Associated symptoms include blurred vision, fatigue, polydipsia, polyuria and weight loss. There are no hypoglycemic complications. Symptoms are improving. Diabetic complications include nephropathy and peripheral neuropathy. Risk factors for coronary artery disease include diabetes mellitus, dyslipidemia, family history, hypertension, sedentary lifestyle, post-menopausal and obesity. Current diabetic treatment includes insulin injections (and Trulicity). She is compliant with treatment most of the time. Her weight is decreasing steadily. She is following a generally unhealthy diet. When asked about meal planning, she reported none (gets meals on wheels). She has not had a previous visit with a dietitian. She participates in exercise intermittently.  Her home blood glucose trend is decreasing steadily. Her breakfast blood glucose range is generally >200 mg/dl. Her lunch blood glucose range is generally >200 mg/dl. Her dinner blood glucose range is generally >200 mg/dl. Her bedtime blood glucose range is generally >200 mg/dl. Her overall blood glucose range is >200 mg/dl. (She presents today with her CGM showing improved glycemic profile overall. Her POCT A1c today is 10.5%, improving drastically from last A1c of 14.2%.  Analysis of her CGM shows TIR 18%, TAR 82%, TBR 0% with a GMI of 9.3%.  She notes she has not been consuming much sweets lately, says her daughter has really helped to encourage this change.  She notes the wound care center released her as she was doing  better.  I also talked with Dois Davenport, patients daughter and POA, who shares that the patient still consumes sweets (chocolate milk) and eats all throughout the night.  She also shares her moms worsening memory issues (getting lost on the way to drs appts, etc).  Dois Davenport shares that she is looking to move her mother in with her and to [place] in the future.) An ACE inhibitor/angiotensin II receptor blocker is being taken. She sees a podiatrist.Eye exam is current.  Hyperlipidemia This is a chronic problem. The current episode started more than 1 year ago. The problem is controlled. Recent lipid tests were reviewed and are variable. Exacerbating diseases include chronic renal disease, diabetes and obesity. Factors aggravating her hyperlipidemia include beta blockers and fatty foods. Current antihyperlipidemic treatment includes statins. The current treatment provides moderate improvement of lipids. Compliance problems include adherence to diet and adherence to exercise.  Risk factors for coronary artery disease include diabetes mellitus, dyslipidemia, family history, hypertension, obesity, post-menopausal and a sedentary lifestyle.  Hypertension This is a chronic problem. The current episode  started more than 1 year ago. The problem has been resolved since onset. The problem is controlled. Associated symptoms include blurred vision. There are no associated agents to hypertension. Risk factors for coronary artery disease include diabetes mellitus, dyslipidemia, family history, obesity and sedentary lifestyle. Past treatments include calcium channel blockers, angiotensin blockers and diuretics. There are no compliance problems.  Hypertensive end-organ damage includes kidney disease. Identifiable causes of hypertension include chronic renal disease.    Review of systems  Constitutional: + steadily decreasing body weight,  current Body mass index is 35.68 kg/m. , + fatigue, no subjective hyperthermia, no subjective hypothermia Eyes: no blurry vision, no xerophthalmia ENT: no sore throat, no nodules palpated in throat, no dysphagia/odynophagia, no hoarseness Cardiovascular: no chest pain, no shortness of breath, no palpitations, no leg swelling Respiratory: no cough, no shortness of breath Gastrointestinal: no nausea/vomiting/diarrhea Genitourinary:+ polyuria-improved Musculoskeletal: no muscle/joint aches Skin: no rashes, no hyperemia Neurological: no tremors, no numbness, no tingling, no dizziness Psychiatric: no depression, no anxiety   Objective:     BP (!) 144/80 (BP Location: Right Arm, Patient Position: Sitting, Cuff Size: Large) Comment: Retake with manuel cuff  Pulse 65   Ht 5\' 3"  (1.6 m)   Wt 201 lb 6.4 oz (91.4 kg)   BMI 35.68 kg/m   Wt Readings from Last 3 Encounters:  06/17/23 201 lb 6.4 oz (91.4 kg)  03/15/23 203 lb 12.8 oz (92.4 kg)  02/13/23 204 lb 3.2 oz (92.6 kg)     BP Readings from Last 3 Encounters:  06/17/23 (!) 144/80  03/15/23 120/70  02/13/23 128/74      Physical Exam- Limited  Constitutional:  Body mass index is 35.68 kg/m. , not in acute distress, normal state of mind Eyes:  EOMI, no exophthalmos Musculoskeletal: no gross deformities,  strength intact in all four extremities, no gross restriction of joint movements, reports no falls since last visit Skin:  no rashes, no hyperemia, Neurological: no tremor with outstretched hands   Diabetic Foot Exam - Simple   No data filed     CMP ( most recent) CMP     Component Value Date/Time   NA 133 (L) 06/11/2023 1055   NA 140 06/22/2022 0000   K 5.2 (H) 06/11/2023 1055   CL 99 06/11/2023 1055   CO2 23 06/11/2023 1055   GLUCOSE 202 (H) 06/11/2023 1055   BUN 51 (H) 06/11/2023 1055   BUN 39 (A)  06/22/2022 0000   CREATININE 1.94 (H) 06/11/2023 1055   CALCIUM 10.2 06/11/2023 1055   PROT 7.7 06/11/2023 1055   ALBUMIN 3.8 06/11/2023 1055   AST 15 06/11/2023 1055   ALT 14 06/11/2023 1055   ALKPHOS 72 06/11/2023 1055   BILITOT 0.4 06/11/2023 1055   GFRNONAA 25 (L) 06/11/2023 1055   GFRAA 43 (L) 06/01/2020 1259     Diabetic Labs (most recent): Lab Results  Component Value Date   HGBA1C 10.5 (A) 06/17/2023   HGBA1C 14.2 (A) 02/13/2023   HGBA1C 8.4 11/14/2022     Lipid Panel ( most recent) Lipid Panel     Component Value Date/Time   CHOL 183 06/21/2022 0000   TRIG 166 (A) 06/21/2022 0000   HDL 52 06/21/2022 0000   CHOLHDL 3 02/20/2011 1051   VLDL 27.8 02/20/2011 1051   LDLCALC 102 06/21/2022 0000   LDLDIRECT 168.8 10/19/2008 0914      Lab Results  Component Value Date   TSH 1.07 02/20/2011   TSH 1.70 10/18/2010   TSH 1.49 08/10/2009   TSH 1.23 07/20/2008   TSH 1.39 06/19/2007           Assessment & Plan:   1) Type 2 diabetes mellitus with stage 4 chronic kidney disease, without long-term current use of insulin (HCC)  She presents today with her CGM showing improved glycemic profile overall. Her POCT A1c today is 10.5%, improving drastically from last A1c of 14.2%.  Analysis of her CGM shows TIR 18%, TAR 82%, TBR 0% with a GMI of 9.3%.  She notes she has not been consuming much sweets lately, says her daughter has really helped to encourage this  change.  She notes the wound care center released her as she was doing better.  I also talked with Dois Davenport, patients daughter and POA, who shares that the patient still consumes sweets (chocolate milk) and eats all throughout the night.  She also shares her moms worsening memory issues (getting lost on the way to drs appts, etc).  Dois Davenport shares that she is looking to move her mother in with her and to [place] in the future.  - ASHITA FLEURY has currently uncontrolled symptomatic type 2 DM since 82 years of age.   -Recent labs reviewed.  - I had a long discussion with her about the progressive nature of diabetes and the pathology behind its complications. -her diabetes is complicated by CKD and she remains at a high risk for more acute and chronic complications which include CAD, CVA, CKD, retinopathy, and neuropathy. These are all discussed in detail with her.  - Nutritional counseling repeated at each appointment due to patients tendency to fall back in to old habits.  - The patient admits there is a room for improvement in their diet and drink choices. -  Suggestion is made for the patient to avoid simple carbohydrates from their diet including Cakes, Sweet Desserts / Pastries, Ice Cream, Soda (diet and regular), Sweet Tea, Candies, Chips, Cookies, Sweet Pastries, Store Bought Juices, Alcohol in Excess of 1-2 drinks a day, Artificial Sweeteners, Coffee Creamer, and "Sugar-free" Products. This will help patient to have stable blood glucose profile and potentially avoid unintended weight gain.   - I encouraged the patient to switch to unprocessed or minimally processed complex starch and increased protein intake (animal or plant source), fruits, and vegetables.   - Patient is advised to stick to a routine mealtimes to eat 3 meals a day and avoid unnecessary snacks (to  snack only to correct hypoglycemia).  - I have approached her with the following individualized plan to manage her diabetes and  patient agrees:   -She is advised to continue her Lantus to 25 units SQ nightly and continue Trulicity 1.5 mg SQ weekly.  Will likely increase this on next visit.  -she is encouraged to continue monitoring glucose at least twice daily, before breakfast and before bed (using her CGM), and to call the clinic if she has readings less than 70 or above 300 for 3 tests in a row.     - she is warned not to take insulin without proper monitoring per orders. - Adjustment parameters are given to her for hypo and hyperglycemia in writing.  - her Glipizide was discontinued, risk outweighs benefit for this patient given her CKD and advanced age, this medication increases her risk of severe hypoglycemia. - she is not a candidate for Metformin due to concurrent renal insufficiency.  - Specific targets for  A1c; LDL, HDL, and Triglycerides were discussed with the patient.  2) Blood Pressure /Hypertension:  her blood pressure is controlled to target for her age and co morbidities today.   she is advised to continue her current medications including Norvasc 5 mg p.o. daily with breakfast, Atenolol 25 mg po daily, Lasix 10 mg po daily, and Benicar 5 mg po daily.  3) Lipids/Hyperlipidemia:    Review of her recent lipid panel from 02/25/23 showed controlled LDL at 56.  she is advised to continue Zocor 20 mg daily at bedtime.  Side effects and precautions discussed with her.  4)  Weight/Diet:  her Body mass index is 35.68 kg/m.  -  clearly complicating her diabetes care.   she is a candidate for weight loss. I discussed with her the fact that loss of 5 - 10% of her  current body weight will have the most impact on her diabetes management.  Exercise, and detailed carbohydrates information provided  -  detailed on discharge instructions.  5) Chronic Care/Health Maintenance: -she is on ACEI/ARB and Statin medications and is encouraged to initiate and continue to follow up with Ophthalmology, Dentist, Podiatrist at  least yearly or according to recommendations, and advised to stay away from smoking. I have recommended yearly flu vaccine and pneumonia vaccine at least every 5 years; moderate intensity exercise for up to 150 minutes weekly; and sleep for at least 7 hours a day.  - she is advised to maintain close follow up with Benita Stabile, MD for primary care needs, as well as her other providers for optimal and coordinated care.      I spent  50  minutes in the care of the patient today including review of labs from CMP, Lipids, Thyroid Function, Hematology (current and previous including abstractions from other facilities); face-to-face time discussing  her blood glucose readings/logs, discussing hypoglycemia and hyperglycemia episodes and symptoms, medications doses, her options of short and long term treatment based on the latest standards of care / guidelines;  discussion about incorporating lifestyle medicine;  and documenting the encounter. Risk reduction counseling performed per USPSTF guidelines to reduce obesity and cardiovascular risk factors.     Please refer to Patient Instructions for Blood Glucose Monitoring and Insulin/Medications Dosing Guide"  in media tab for additional information. Please  also refer to " Patient Self Inventory" in the Media  tab for reviewed elements of pertinent patient history.  Julianne Handler participated in the discussions, expressed understanding, and voiced agreement with the  above plans.  All questions were answered to her satisfaction. she is encouraged to contact clinic should she have any questions or concerns prior to her return visit.     Follow up plan: - Return in about 3 months (around 09/17/2023) for Diabetes F/U with A1c in office, No previsit labs, Bring meter and logs.   Ronny Bacon, [facility] [address] [place], [place] [address] Phone: [phone] Fax: [phone]  06/17/2023, 11:00 AM

## 2023-06-17 NOTE — Progress Notes (Signed)
[facility] [address], [place] [address]    Clinic Day:  06/17/2023  Referring physician: Benita Stabile, MD  Patient Care Team: Benita Stabile, MD as PCP - General (Internal Medicine)   ASSESSMENT & PLAN:   Assessment: 1.  Right breast invasive lobular carcinoma: -Stereotactic biopsy on 06/18/2019 showed ILC in the right breast lower inner quadrant.  90% positive for ER/PR.  Ki-67 was 5%.  Tumor negative for HER-2. -She refused surgical intervention. -MRI of the breast on 08/24/2019 showed biopsy-proven right breast invasive lobular carcinoma and atypical lobular hyperplasia.  No additional suspicious enhancements. -She was started on anastrozole on 08/24/2019. -MRI of the breast on 02/19/2020 showed treatment response with enhancement at the site of atypical lobular hyperplasia in the upper inner quadrant of the right breast measuring 0.9 x 0.5 cm (previously 1.3 x 0.9 cm).  Enhancement at the site of known invasive lobular carcinoma in the lower inner quadrant of the right breast has resolved completely.  Left breast remains negative.  No adenopathy. -MRI of the breast on 08/29/2020 shows non-mass enhancement involving the lower outer quadrant at anterior depth spanning 9 x 8 x 3 mm demonstrating progressive enhancement kinetics, new since prior MRI in April.  No mammographic correlate.  No abnormal enhancement at the clip biopsy site.  Left breast has no lesions.   2.  Osteopenia: -DEXA scan on 07/20/2019 shows T score of -1.9.   3.  CKD: -Creatinine is stable around 1.4.  Plan: 1.  Right breast invasive lobular carcinoma: - She is tolerating anastrozole 1 mg tablet daily.  Denies any hot flashes or musculoskeletal symptoms. - No palpable adenopathy in the axilla and supraclavicular region. - We reviewed mammogram diagnostic bilateral from 12/04/2022 which did not show any associated masses or suspicious abnormalities at the site of biopsy-proven ILC and ALH of  the right breast.  No abnormality in the left breast. - Continue anastrozole as definitive treatment-she refused surgery. - RTC 6 months for follow-up of with repeat right breast diagnostic mammogram and exam.   2.  Osteopenia: - Vitamin D level is 26.  She was told to start taking vitamin D 400 units daily.   3.  Normocytic anemia: - Mild anemia with hemoglobin 11.2.  Will check ferritin and iron panel at next visit.  Anemia from combination of CKD and iron deficiency.     Breast Cancer therapy associated bone loss: I have recommended calcium, Vitamin D and weight bearing exercises.    No orders of the defined types were placed in this encounter.      Alben Deeds Teague,acting as a Neurosurgeon for Doreatha Massed, MD.,have documented all relevant documentation on the behalf of Doreatha Massed, MD,as directed by  Doreatha Massed, MD while in the presence of Doreatha Massed, MD.  ***   Winchester R Teague   8/19/20246:39 PM  CHIEF COMPLAINT:   Diagnosis:  right breast cancer     Cancer Staging  No matching staging information was found for the patient.    Prior Therapy: None  Current Therapy:  Anastrozole   HISTORY OF PRESENT ILLNESS:   Oncology History   No history exists.     INTERVAL HISTORY:   Krista Payne is a 82 y.o. female presenting to clinic today for follow up of right breast cancer. She was last seen by me on 12/11/22.  Since her last visit, she underwent a right MM on 06/11/23 that found: stable appearance of the RIGHT breast  and no interval changes to indicate progression of disease.  Today, she states that she is doing well overall. Her appetite level is at ***%. Her energy level is at ***%.  PAST MEDICAL HISTORY:   Past Medical History: Past Medical History:  Diagnosis Date   Breast cancer (HCC) 2020   lobular cancer right breast   Chronic kidney disease    Diabetes mellitus without complication (HCC)    Hyperlipidemia    Hypertension     Macular degeneration    Osteoarthritis     Surgical History: Past Surgical History:  Procedure Laterality Date   BREAST BIOPSY Right 2020   lobular ca   CATARACT EXTRACTION W/PHACO Right 02/07/2015   Procedure: CATARACT EXTRACTION PHACO AND INTRAOCULAR LENS PLACEMENT (IOC);  Surgeon: Gemma Payor, MD;  Location: [facility];  Service: Ophthalmology;  Laterality: Right;  CDE:12.72   CATARACT EXTRACTION W/PHACO Left 02/24/2015   Procedure: CATARACT EXTRACTION PHACO AND INTRAOCULAR LENS PLACEMENT LEFT EYE;  Surgeon: Gemma Payor, MD;  Location: [facility];  Service: Ophthalmology;  Laterality: Left;  CDE 11.69   COLONOSCOPY      Social History: Social History   Socioeconomic History   Marital status: Widowed    Spouse name: Not on file   Number of children: 3   Years of education: 19   Highest education level: 12th grade  Occupational History   Not on file  Tobacco Use   Smoking status: Never    Passive exposure: Never   Smokeless tobacco: Never  Vaping Use   Vaping status: Never Used  Substance and Sexual Activity   Alcohol use: No   Drug use: No   Sexual activity: Not Currently    Partners: Male  Other Topics Concern   Not on file  Social History Narrative   Not on file   Social Determinants of Health   Financial Resource Strain: Low Risk  (07/19/2022)   Overall Financial Resource Strain (CARDIA)    Difficulty of Paying Living Expenses: Not hard at all  Food Insecurity: No Food Insecurity (07/19/2022)   Hunger Vital Sign    Worried About Running Out of Food in the Last Year: Never true    Ran Out of Food in the Last Year: Never true  Transportation Needs: No Transportation Needs (07/19/2022)   PRAPARE - Administrator, Civil Service (Medical): No    Lack of Transportation (Non-Medical): No  Physical Activity: Inactive (07/19/2022)   Exercise Vital Sign    Days of Exercise per Week: 0 days    Minutes of Exercise per Session: 0 min  Stress: No Stress Concern Present  (07/19/2022)   [employer] of Occupational Health - Occupational Stress Questionnaire    Feeling of Stress : Not at all  Social Connections: Moderately Integrated (07/19/2022)   Social Connection and Isolation Panel [NHANES]    Frequency of Communication with Friends and Family: More than three times a week    Frequency of Social Gatherings with Friends and Family: More than three times a week    Attends Religious Services: More than 4 times per year    Active Member of [employer] or Organizations: Yes    Attends Banker Meetings: More than 4 times per year    Marital Status: Widowed  Intimate Partner Violence: Not At Risk (07/19/2022)   Humiliation, Afraid, Rape, and Kick questionnaire    Fear of Current or Ex-Partner: No    Emotionally Abused: No    Physically Abused: No  Sexually Abused: No    Family History: Family History  Problem Relation Age of Onset   Macular degeneration Mother    Diabetes Father    Heart disease Daughter     Current Medications:  Current Outpatient Medications:    amLODipine (NORVASC) 5 MG tablet, Take 0.5 tablets (2.5 mg total) by mouth daily., Disp: , Rfl:    anastrozole (ARIMIDEX) 1 MG tablet, Take 1 tablet (1 mg total) by mouth daily., Disp: 90 tablet, Rfl: 3   atenolol (TENORMIN) 25 MG tablet, Take 25 mg by mouth daily., Disp: , Rfl:    buPROPion (WELLBUTRIN XL) 150 MG 24 hr tablet, Take 300 mg by mouth daily as needed (feeling moody/depressed)., Disp: , Rfl:    Dulaglutide (TRULICITY) 1.5 MG/0.5ML SOPN, INJECT 1.5 MG UNDER THE SKIN ONCE A WEEK, Disp: 6 mL, Rfl: 1   furosemide (LASIX) 20 MG tablet, Take 10 mg by mouth daily as needed for fluid., Disp: , Rfl:    insulin glargine (LANTUS) 100 UNIT/ML injection, Inject 0.16 mLs (16 Units total) into the skin at bedtime., Disp: 15 mL, Rfl: 3   Insulin Pen Needle (PEN NEEDLES) 31G X 6 MM MISC, Use to inject insulin once daily, Disp: 100 each, Rfl: 3   mupirocin ointment (BACTROBAN) 2 %,  SMARTSIG:sparingly Topical 3 Times Daily, Disp: , Rfl:    olmesartan (BENICAR) 5 MG tablet, Take 5 mg by mouth daily., Disp: , Rfl:    simvastatin (ZOCOR) 20 MG tablet, Take 1 tablet (20 mg total) by mouth at bedtime., Disp: 90 tablet, Rfl: 3   Allergies: No Known Allergies  REVIEW OF SYSTEMS:   Review of Systems  Constitutional:  Negative for chills, fatigue and fever.  HENT:   Negative for lump/mass, mouth sores, nosebleeds, sore throat and trouble swallowing.   Eyes:  Negative for eye problems.  Respiratory:  Negative for cough and shortness of breath.   Cardiovascular:  Negative for chest pain, leg swelling and palpitations.  Gastrointestinal:  Negative for abdominal pain, constipation, diarrhea, nausea and vomiting.  Genitourinary:  Negative for bladder incontinence, difficulty urinating, dysuria, frequency, hematuria and nocturia.   Musculoskeletal:  Negative for arthralgias, back pain, flank pain, myalgias and neck pain.  Skin:  Negative for itching and rash.  Neurological:  Negative for dizziness, headaches and numbness.  Hematological:  Does not bruise/bleed easily.  Psychiatric/Behavioral:  Negative for depression, sleep disturbance and suicidal ideas. The patient is not nervous/anxious.   All other systems reviewed and are negative.    VITALS:   There were no vitals taken for this visit.  Wt Readings from Last 3 Encounters:  06/17/23 201 lb 6.4 oz (91.4 kg)  03/15/23 203 lb 12.8 oz (92.4 kg)  02/13/23 204 lb 3.2 oz (92.6 kg)    There is no height or weight on file to calculate BMI.  Performance status (ECOG): 1 - Symptomatic but completely ambulatory  PHYSICAL EXAM:   Physical Exam Vitals and nursing note reviewed. Exam conducted with a chaperone present.  Constitutional:      Appearance: Normal appearance.  Cardiovascular:     Rate and Rhythm: Normal rate and regular rhythm.     Pulses: Normal pulses.     Heart sounds: Normal heart sounds.  Pulmonary:      Effort: Pulmonary effort is normal.     Breath sounds: Normal breath sounds.  Abdominal:     Palpations: Abdomen is soft. There is no hepatomegaly, splenomegaly or mass.     Tenderness: There  is no abdominal tenderness.  Musculoskeletal:     Right lower leg: No edema.     Left lower leg: No edema.  Lymphadenopathy:     Cervical: No cervical adenopathy.     Right cervical: No superficial, deep or posterior cervical adenopathy.    Left cervical: No superficial, deep or posterior cervical adenopathy.     Upper Body:     Right upper body: No supraclavicular or axillary adenopathy.     Left upper body: No supraclavicular or axillary adenopathy.  Neurological:     General: No focal deficit present.     Mental Status: She is alert and oriented to person, place, and time.  Psychiatric:        Mood and Affect: Mood normal.        Behavior: Behavior normal.     LABS:      Latest Ref Rng & Units 06/11/2023   10:55 AM 12/04/2022    9:44 AM 08/20/2022    2:32 PM  CBC  WBC 4.0 - 10.5 K/uL 10.5  10.0  7.9   Hemoglobin 12.0 - 15.0 g/dL 16.1  09.6  04.5   Hematocrit 36.0 - 46.0 % 37.4  35.2  33.4   Platelets 150 - 400 K/uL 415  447  378       Latest Ref Rng & Units 06/11/2023   10:55 AM 12/04/2022    9:45 AM 08/20/2022    2:32 PM  CMP  Glucose 70 - 99 mg/dL 409  811  914   BUN 8 - 23 mg/dL 51  39  32   Creatinine 0.44 - 1.00 mg/dL 7.82  9.56  2.13   Sodium 135 - 145 mmol/L 133  135  133   Potassium 3.5 - 5.1 mmol/L 5.2  4.9  4.7   Chloride 98 - 111 mmol/L 99  103  103   CO2 22 - 32 mmol/L 23  22  22    Calcium 8.9 - 10.3 mg/dL 08.6  9.5  9.0   Total Protein 6.5 - 8.1 g/dL 7.7  7.7  7.5   Total Bilirubin 0.3 - 1.2 mg/dL 0.4  0.7  0.8   Alkaline Phos 38 - 126 U/L 72  79  65   AST 15 - 41 U/L 15  16  15    ALT 0 - 44 U/L 14  11  11       No results found for: "CEA1", "CEA" / No results found for: "CEA1", "CEA" No results found for: "PSA1" No results found for: "VHQ469" No results  found for: "CAN125"  No results found for: "TOTALPROTELP", "ALBUMINELP", "A1GS", "A2GS", "BETS", "BETA2SER", "GAMS", "MSPIKE", "SPEI" Lab Results  Component Value Date   TIBC 402 06/11/2023   FERRITIN 50 06/11/2023   FERRITIN 224 11/15/2021   FERRITIN 255 11/14/2021   IRONPCTSAT 14 06/11/2023   No results found for: "LDH"   STUDIES:   MM DIAG BREAST TOMO UNI RIGHT  Result Date: 06/11/2023 CLINICAL DATA:  History stereotactic biopsies of the RIGHT breast performed 06/18/2019. Biopsy marked with a coil clip in the UPPER INNER QUADRANT showed atypical lobular hyperplasia. Biopsy mass in the LOWER INNER QUADRANT is marked with X clip in showed grade 2 invasive lobular carcinoma. Patient has not had surgery or chemotherapy. She takes anastrozole. EXAM: DIGITAL DIAGNOSTIC UNILATERAL RIGHT MAMMOGRAM WITH TOMOSYNTHESIS AND CAD TECHNIQUE: Right digital diagnostic mammography and breast tomosynthesis was performed. The images were evaluated with computer-aided detection. COMPARISON:  Previous exam(s). ACR Breast Density  Category b: There are scattered areas of fibroglandular density. FINDINGS: Coil shaped an X shaped tissue marker clips are again identified in the MEDIAL aspect of the RIGHT breast. Minimal asymmetry associated with the X shaped clip shows long-term stability. No new or suspicious findings. IMPRESSION: Stable appearance of the RIGHT breast. No interval changes to indicate progression of disease. RECOMMENDATION: Bilateral diagnostic mammogram in February 2025. I have discussed the findings and recommendations with the patient. If applicable, a reminder letter will be sent to the patient regarding the next appointment. BI-RADS CATEGORY  6: Known biopsy-proven malignancy. Electronically Signed   By: Norva Pavlov M.D.   On: 06/11/2023 11:28

## 2023-06-18 ENCOUNTER — Other Ambulatory Visit: Payer: Self-pay | Admitting: Hematology

## 2023-06-18 ENCOUNTER — Inpatient Hospital Stay (HOSPITAL_BASED_OUTPATIENT_CLINIC_OR_DEPARTMENT_OTHER): Payer: Medicare HMO | Admitting: Hematology

## 2023-06-18 VITALS — BP 180/95 | HR 65 | Temp 98.0°F | Resp 18 | Ht 63.0 in | Wt 200.0 lb

## 2023-06-18 DIAGNOSIS — M8580 Other specified disorders of bone density and structure, unspecified site: Secondary | ICD-10-CM | POA: Diagnosis not present

## 2023-06-18 DIAGNOSIS — C50911 Malignant neoplasm of unspecified site of right female breast: Secondary | ICD-10-CM

## 2023-06-18 DIAGNOSIS — C50311 Malignant neoplasm of lower-inner quadrant of right female breast: Secondary | ICD-10-CM | POA: Diagnosis not present

## 2023-06-18 DIAGNOSIS — E559 Vitamin D deficiency, unspecified: Secondary | ICD-10-CM

## 2023-06-18 DIAGNOSIS — Z79811 Long term (current) use of aromatase inhibitors: Secondary | ICD-10-CM | POA: Diagnosis not present

## 2023-06-18 DIAGNOSIS — D631 Anemia in chronic kidney disease: Secondary | ICD-10-CM | POA: Diagnosis not present

## 2023-06-18 DIAGNOSIS — N189 Chronic kidney disease, unspecified: Secondary | ICD-10-CM | POA: Diagnosis not present

## 2023-06-18 DIAGNOSIS — D508 Other iron deficiency anemias: Secondary | ICD-10-CM | POA: Diagnosis not present

## 2023-06-18 DIAGNOSIS — D509 Iron deficiency anemia, unspecified: Secondary | ICD-10-CM | POA: Diagnosis not present

## 2023-06-18 DIAGNOSIS — Z170 Estrogen receptor positive status [ER+]: Secondary | ICD-10-CM | POA: Diagnosis not present

## 2023-06-18 DIAGNOSIS — Z853 Personal history of malignant neoplasm of breast: Secondary | ICD-10-CM | POA: Diagnosis not present

## 2023-06-18 MED ORDER — ANASTROZOLE 1 MG PO TABS: 1.0000 mg | ORAL_TABLET | Freq: Every day | ORAL | 3 refills | Status: DC

## 2023-06-18 NOTE — Patient Instructions (Addendum)
[facility] at [facility] Discharge Instructions   You were seen and examined today by Dr. Ellin Saba.  He reviewed the results of your mammogram which shows that the mass in the breast is stable.   Continue anastrozole daily as prescribed.   We will see you back in 6 months. We will repeat a mammogram and lab work prior to your next visit.  Return as scheduled.   Thank you for choosing [facility] at [facility] to provide your oncology and hematology care.  To afford each patient quality time with our provider, please arrive at least 15 minutes before your scheduled appointment time.   If you have a lab appointment with the Cancer Center please come in thru the Main Entrance and check in at the main information desk.  You need to re-schedule your appointment should you arrive 10 or more minutes late.  We strive to give you quality time with our providers, and arriving late affects you and other patients whose appointments are after yours.  Also, if you no show three or more times for appointments you may be dismissed from the clinic at the providers discretion.     Again, thank you for choosing [facility].  Our hope is that these requests will decrease the amount of time that you wait before being seen by our physicians.       _____________________________________________________________  Should you have questions after your visit to [facility], please contact our office at [phone] and follow the prompts.  Our office hours are 8:00 a.m. and 4:30 p.m. Monday - Friday.  Please note that voicemails left after 4:00 p.m. may not be returned until the following business day.  We are closed weekends and major holidays.  You do have access to a nurse 24-7, just call the main number to the clinic [phone] and do not press any options, hold on the line and a nurse will answer the phone.    For prescription refill  requests, have your pharmacy contact our office and allow 72 hours.    Due to Covid, you will need to wear a mask upon entering the hospital. If you do not have a mask, a mask will be given to you at the Main Entrance upon arrival. For doctor visits, patients may have 1 support person age 82 or older with them. For treatment visits, patients can not have anyone with them due to social distancing guidelines and our immunocompromised population.

## 2023-07-02 DIAGNOSIS — F329 Major depressive disorder, single episode, unspecified: Secondary | ICD-10-CM | POA: Diagnosis not present

## 2023-07-02 DIAGNOSIS — I10 Essential (primary) hypertension: Secondary | ICD-10-CM | POA: Diagnosis not present

## 2023-07-02 DIAGNOSIS — R600 Localized edema: Secondary | ICD-10-CM | POA: Diagnosis not present

## 2023-07-02 DIAGNOSIS — L97909 Non-pressure chronic ulcer of unspecified part of unspecified lower leg with unspecified severity: Secondary | ICD-10-CM | POA: Diagnosis not present

## 2023-07-02 DIAGNOSIS — N184 Chronic kidney disease, stage 4 (severe): Secondary | ICD-10-CM | POA: Diagnosis not present

## 2023-07-02 DIAGNOSIS — R296 Repeated falls: Secondary | ICD-10-CM | POA: Diagnosis not present

## 2023-07-02 DIAGNOSIS — R21 Rash and other nonspecific skin eruption: Secondary | ICD-10-CM | POA: Diagnosis not present

## 2023-07-02 DIAGNOSIS — F0390 Unspecified dementia without behavioral disturbance: Secondary | ICD-10-CM | POA: Diagnosis not present

## 2023-07-02 DIAGNOSIS — E1122 Type 2 diabetes mellitus with diabetic chronic kidney disease: Secondary | ICD-10-CM | POA: Diagnosis not present

## 2023-07-02 DIAGNOSIS — Z23 Encounter for immunization: Secondary | ICD-10-CM | POA: Diagnosis not present

## 2023-07-02 DIAGNOSIS — I129 Hypertensive chronic kidney disease with stage 1 through stage 4 chronic kidney disease, or unspecified chronic kidney disease: Secondary | ICD-10-CM | POA: Diagnosis not present

## 2023-07-08 DIAGNOSIS — H43813 Vitreous degeneration, bilateral: Secondary | ICD-10-CM | POA: Diagnosis not present

## 2023-07-08 DIAGNOSIS — H348322 Tributary (branch) retinal vein occlusion, left eye, stable: Secondary | ICD-10-CM | POA: Diagnosis not present

## 2023-07-08 DIAGNOSIS — H35033 Hypertensive retinopathy, bilateral: Secondary | ICD-10-CM | POA: Diagnosis not present

## 2023-07-08 DIAGNOSIS — H353231 Exudative age-related macular degeneration, bilateral, with active choroidal neovascularization: Secondary | ICD-10-CM | POA: Diagnosis not present

## 2023-07-08 DIAGNOSIS — H43391 Other vitreous opacities, right eye: Secondary | ICD-10-CM | POA: Diagnosis not present

## 2023-07-11 ENCOUNTER — Ambulatory Visit (HOSPITAL_COMMUNITY): Payer: Medicare HMO | Admitting: Physical Therapy

## 2023-07-11 DIAGNOSIS — E1165 Type 2 diabetes mellitus with hyperglycemia: Secondary | ICD-10-CM | POA: Diagnosis not present

## 2023-07-15 ENCOUNTER — Telehealth: Payer: Self-pay | Admitting: *Deleted

## 2023-07-15 DIAGNOSIS — I872 Venous insufficiency (chronic) (peripheral): Secondary | ICD-10-CM | POA: Diagnosis not present

## 2023-07-15 NOTE — Telephone Encounter (Signed)
Daughter left a message, asking how many units of insulin, her Krista Payne is to take at bedtime. Patient has not had it in a couple of days, ans she, Krista Payne will be injecting this for her. She ask that we call her back with this information. She is injecting the Trulicity once a week.

## 2023-07-15 NOTE — Telephone Encounter (Signed)
Per the patient's office note 06/10/2023 , she is ti be injecting Lantus 25 units at bedtime. I called and told her daughter , Dois Davenport.

## 2023-08-05 DIAGNOSIS — E039 Hypothyroidism, unspecified: Secondary | ICD-10-CM | POA: Diagnosis not present

## 2023-08-05 DIAGNOSIS — E069 Thyroiditis, unspecified: Secondary | ICD-10-CM | POA: Diagnosis not present

## 2023-08-05 DIAGNOSIS — E079 Disorder of thyroid, unspecified: Secondary | ICD-10-CM | POA: Diagnosis not present

## 2023-08-10 DIAGNOSIS — E1165 Type 2 diabetes mellitus with hyperglycemia: Secondary | ICD-10-CM | POA: Diagnosis not present

## 2023-08-23 DIAGNOSIS — R7989 Other specified abnormal findings of blood chemistry: Secondary | ICD-10-CM | POA: Diagnosis not present

## 2023-08-23 DIAGNOSIS — I10 Essential (primary) hypertension: Secondary | ICD-10-CM | POA: Diagnosis not present

## 2023-08-23 DIAGNOSIS — E119 Type 2 diabetes mellitus without complications: Secondary | ICD-10-CM | POA: Diagnosis not present

## 2023-08-29 ENCOUNTER — Telehealth: Payer: Self-pay | Admitting: *Deleted

## 2023-08-29 NOTE — Telephone Encounter (Signed)
Patient was called and a message was left with Whitney's recommendation. 

## 2023-08-29 NOTE — Telephone Encounter (Signed)
Yes, the wellbutrin would come from her PCP.  She should be injecting 25 units SQ nightly of the Lantus.

## 2023-08-29 NOTE — Telephone Encounter (Signed)
Patient's daughter , Dois Davenport called and left a message. She is asking that we refill her mother's bupropion 300 mg. 90 supply. This would go to [facility].She is also asking for verification on the patient's insulin, How much she is to dial up to. The Lantus is says 16 units at bedtime then it is noted that she is injecting 30 units at bedtime. Her daughter says that she has appointment 09/18/23. That her Mother is doing good, the dementia is getting worse.

## 2023-08-31 DIAGNOSIS — E069 Thyroiditis, unspecified: Secondary | ICD-10-CM | POA: Diagnosis not present

## 2023-08-31 DIAGNOSIS — E079 Disorder of thyroid, unspecified: Secondary | ICD-10-CM | POA: Diagnosis not present

## 2023-08-31 DIAGNOSIS — E039 Hypothyroidism, unspecified: Secondary | ICD-10-CM | POA: Diagnosis not present

## 2023-09-03 DIAGNOSIS — N189 Chronic kidney disease, unspecified: Secondary | ICD-10-CM | POA: Diagnosis not present

## 2023-09-03 DIAGNOSIS — N2581 Secondary hyperparathyroidism of renal origin: Secondary | ICD-10-CM | POA: Diagnosis not present

## 2023-09-03 DIAGNOSIS — E1129 Type 2 diabetes mellitus with other diabetic kidney complication: Secondary | ICD-10-CM | POA: Diagnosis not present

## 2023-09-03 DIAGNOSIS — I5032 Chronic diastolic (congestive) heart failure: Secondary | ICD-10-CM | POA: Diagnosis not present

## 2023-09-03 DIAGNOSIS — R809 Proteinuria, unspecified: Secondary | ICD-10-CM | POA: Diagnosis not present

## 2023-09-03 DIAGNOSIS — D638 Anemia in other chronic diseases classified elsewhere: Secondary | ICD-10-CM | POA: Diagnosis not present

## 2023-09-03 DIAGNOSIS — E1122 Type 2 diabetes mellitus with diabetic chronic kidney disease: Secondary | ICD-10-CM | POA: Diagnosis not present

## 2023-09-03 DIAGNOSIS — I129 Hypertensive chronic kidney disease with stage 1 through stage 4 chronic kidney disease, or unspecified chronic kidney disease: Secondary | ICD-10-CM | POA: Diagnosis not present

## 2023-09-09 DIAGNOSIS — E1122 Type 2 diabetes mellitus with diabetic chronic kidney disease: Secondary | ICD-10-CM | POA: Diagnosis not present

## 2023-09-09 DIAGNOSIS — D638 Anemia in other chronic diseases classified elsewhere: Secondary | ICD-10-CM | POA: Diagnosis not present

## 2023-09-09 DIAGNOSIS — E875 Hyperkalemia: Secondary | ICD-10-CM | POA: Diagnosis not present

## 2023-09-09 DIAGNOSIS — I5032 Chronic diastolic (congestive) heart failure: Secondary | ICD-10-CM | POA: Diagnosis not present

## 2023-09-10 DIAGNOSIS — E1165 Type 2 diabetes mellitus with hyperglycemia: Secondary | ICD-10-CM | POA: Diagnosis not present

## 2023-09-17 ENCOUNTER — Ambulatory Visit: Payer: Medicare HMO | Admitting: Nurse Practitioner

## 2023-09-17 DIAGNOSIS — Z7985 Long-term (current) use of injectable non-insulin antidiabetic drugs: Secondary | ICD-10-CM

## 2023-09-17 DIAGNOSIS — Z794 Long term (current) use of insulin: Secondary | ICD-10-CM

## 2023-09-17 DIAGNOSIS — E1122 Type 2 diabetes mellitus with diabetic chronic kidney disease: Secondary | ICD-10-CM

## 2023-09-18 ENCOUNTER — Encounter: Payer: Self-pay | Admitting: Nurse Practitioner

## 2023-09-18 ENCOUNTER — Telehealth: Payer: Self-pay | Admitting: Nurse Practitioner

## 2023-09-19 NOTE — Telephone Encounter (Signed)
Arrangements are being made with daughter to get patient rescheduled.

## 2023-09-19 NOTE — Telephone Encounter (Signed)
See message.

## 2023-09-30 DIAGNOSIS — E119 Type 2 diabetes mellitus without complications: Secondary | ICD-10-CM | POA: Diagnosis not present

## 2023-09-30 DIAGNOSIS — I10 Essential (primary) hypertension: Secondary | ICD-10-CM | POA: Diagnosis not present

## 2023-09-30 DIAGNOSIS — R7989 Other specified abnormal findings of blood chemistry: Secondary | ICD-10-CM | POA: Diagnosis not present

## 2023-09-30 NOTE — Telephone Encounter (Signed)
See patient message regarding the no show fee

## 2023-10-03 ENCOUNTER — Ambulatory Visit (INDEPENDENT_AMBULATORY_CARE_PROVIDER_SITE_OTHER): Payer: Medicare HMO | Admitting: Nurse Practitioner

## 2023-10-03 ENCOUNTER — Encounter: Payer: Self-pay | Admitting: Nurse Practitioner

## 2023-10-03 VITALS — BP 158/78 | HR 60 | Ht 63.0 in | Wt 201.8 lb

## 2023-10-03 DIAGNOSIS — Z7985 Long-term (current) use of injectable non-insulin antidiabetic drugs: Secondary | ICD-10-CM

## 2023-10-03 DIAGNOSIS — Z794 Long term (current) use of insulin: Secondary | ICD-10-CM | POA: Diagnosis not present

## 2023-10-03 DIAGNOSIS — E1122 Type 2 diabetes mellitus with diabetic chronic kidney disease: Secondary | ICD-10-CM

## 2023-10-03 DIAGNOSIS — N184 Chronic kidney disease, stage 4 (severe): Secondary | ICD-10-CM

## 2023-10-03 LAB — POCT GLYCOSYLATED HEMOGLOBIN (HGB A1C): Hemoglobin A1C: 8.7 % — AB (ref 4.0–5.6)

## 2023-10-03 MED ORDER — TRULICITY 3 MG/0.5ML ~~LOC~~ SOAJ: 3.0000 mg | SUBCUTANEOUS | 1 refills | Status: DC

## 2023-10-03 NOTE — Progress Notes (Signed)
Endocrinology Follow Up Note       10/03/2023, 11:21 AM   Subjective:    Patient ID: Krista Payne, female    DOB: 10-08-1941.  Krista Payne is being seen in follow up after being seen in consultation for management of currently uncontrolled symptomatic diabetes requested by  Benita Stabile, MD.   Past Medical History:  Diagnosis Date   Breast cancer [facility]) 2020   lobular cancer right breast   Chronic kidney disease    Diabetes mellitus without complication (HCC)    Hyperlipidemia    Hypertension    Macular degeneration    Osteoarthritis     Past Surgical History:  Procedure Laterality Date   BREAST BIOPSY Right 2020   lobular ca   CATARACT EXTRACTION W/PHACO Right 02/07/2015   Procedure: CATARACT EXTRACTION PHACO AND INTRAOCULAR LENS PLACEMENT (IOC);  Surgeon: Gemma Payor, MD;  Location: [facility];  Service: Ophthalmology;  Laterality: Right;  CDE:12.72   CATARACT EXTRACTION W/PHACO Left 02/24/2015   Procedure: CATARACT EXTRACTION PHACO AND INTRAOCULAR LENS PLACEMENT LEFT EYE;  Surgeon: Gemma Payor, MD;  Location: [facility];  Service: Ophthalmology;  Laterality: Left;  CDE 11.69   COLONOSCOPY      Social History   Socioeconomic History   Marital status: Widowed    Spouse name: Not on file   Number of children: 3   Years of education: 17   Highest education level: 12th grade  Occupational History   Not on file  Tobacco Use   Smoking status: Never    Passive exposure: Never   Smokeless tobacco: Never  Vaping Use   Vaping status: Never Used  Substance and Sexual Activity   Alcohol use: No   Drug use: No   Sexual activity: Not Currently    Partners: Male  Other Topics Concern   Not on file  Social History Narrative   Not on file   Social Determinants of Health   Financial Resource Strain: Low Risk  (07/19/2022)   Overall Financial Resource Strain (CARDIA)    Difficulty of Paying Living Expenses:  Not hard at all  Food Insecurity: No Food Insecurity (07/19/2022)   Hunger Vital Sign    Worried About Running Out of Food in the Last Year: Never true    Ran Out of Food in the Last Year: Never true  Transportation Needs: No Transportation Needs (07/19/2022)   PRAPARE - Administrator, Civil Service (Medical): No    Lack of Transportation (Non-Medical): No  Physical Activity: Inactive (07/19/2022)   Exercise Vital Sign    Days of Exercise per Week: 0 days    Minutes of Exercise per Session: 0 min  Stress: No Stress Concern Present (07/19/2022)   [employer] of Occupational Health - Occupational Stress Questionnaire    Feeling of Stress : Not at all  Social Connections: Moderately Integrated (07/19/2022)   Social Connection and Isolation Panel [NHANES]    Frequency of Communication with Friends and Family: More than three times a week    Frequency of Social Gatherings with Friends and Family: More than three times a week    Attends Religious Services: More than 4 times per  year    Active Member of Clubs or Organizations: Yes    Attends Banker Meetings: More than 4 times per year    Marital Status: Widowed    Family History  Problem Relation Age of Onset   Macular degeneration Mother    Diabetes Father    Heart disease Daughter     Outpatient Encounter Medications as of 10/03/2023  Medication Sig   amLODipine (NORVASC) 5 MG tablet Take 0.5 tablets (2.5 mg total) by mouth daily.   anastrozole (ARIMIDEX) 1 MG tablet Take 1 tablet (1 mg total) by mouth daily.   atenolol (TENORMIN) 25 MG tablet Take 25 mg by mouth daily.   buPROPion (WELLBUTRIN XL) 150 MG 24 hr tablet Take 300 mg by mouth daily as needed (feeling moody/depressed).   [START ON 12/02/2023] Dulaglutide (TRULICITY) 3 MG/0.5ML SOAJ Inject 3 mg as directed once a week.   furosemide (LASIX) 20 MG tablet Take 10 mg by mouth daily as needed for fluid.   insulin glargine (LANTUS) 100 UNIT/ML injection  Inject 0.16 mLs (16 Units total) into the skin at bedtime.   Insulin Pen Needle (PEN NEEDLES) 31G X 6 MM MISC Use to inject insulin once daily   melatonin 3 MG TABS tablet Take 3 mg by mouth at bedtime.   mupirocin ointment (BACTROBAN) 2 % SMARTSIG:sparingly Topical 3 Times Daily   olmesartan (BENICAR) 5 MG tablet Take 5 mg by mouth daily.   simvastatin (ZOCOR) 20 MG tablet Take 1 tablet (20 mg total) by mouth at bedtime.   [DISCONTINUED] Dulaglutide (TRULICITY) 1.5 MG/0.5ML SOPN INJECT 1.5 MG UNDER THE SKIN ONCE A WEEK   No facility-administered encounter medications on file as of 10/03/2023.    ALLERGIES: No Known Allergies  VACCINATION STATUS: Immunization History  Administered Date(s) Administered   Influenza Split 11/13/2011   Influenza Whole 07/20/2008, 08/10/2009, 10/18/2010   Influenza-Unspecified 07/29/2013, 08/12/2020   Pneumococcal Polysaccharide-23 07/20/2008   Td 07/29/1998, 08/10/2009    Diabetes She presents for her follow-up diabetic visit. She has type 2 diabetes mellitus. Her disease course has been improving. There are no hypoglycemic associated symptoms. Associated symptoms include blurred vision and fatigue. Pertinent negatives for diabetes include no polydipsia, no polyuria and no weight loss. There are no hypoglycemic complications. Symptoms are improving. Diabetic complications include nephropathy and peripheral neuropathy. Risk factors for coronary artery disease include diabetes mellitus, dyslipidemia, family history, hypertension, sedentary lifestyle, post-menopausal and obesity. Current diabetic treatment includes insulin injections (and Trulicity). She is compliant with treatment most of the time. Her weight is fluctuating minimally. She is following a generally unhealthy diet. When asked about meal planning, she reported none (gets meals on wheels). She has not had a previous visit with a dietitian. She participates in exercise intermittently. Her home blood  glucose trend is decreasing steadily. Her breakfast blood glucose range is generally 130-140 mg/dl. Her lunch blood glucose range is generally 140-180 mg/dl. Her dinner blood glucose range is generally >200 mg/dl. Her bedtime blood glucose range is generally >200 mg/dl. Her overall blood glucose range is 180-200 mg/dl. (She presents today, accompanied by her daughter Dois Davenport, with her CGM showing greatly improved glycemic profile overall.  Her POCT A1c today is 8.7%, improving from last visit of 10.5%.  Analysis of her CGM shows TIR 49%, TAR 47%, TBR 5% with a GMI of 7.7%.  She still forgets her insulin from time to time.  Dois Davenport is planning on moving her and her mom down to [place] within the next  several months.  ) An ACE inhibitor/angiotensin II receptor blocker is being taken. She sees a podiatrist.Eye exam is current.  Hyperlipidemia This is a chronic problem. The current episode started more than 1 year ago. The problem is controlled. Recent lipid tests were reviewed and are variable. Exacerbating diseases include chronic renal disease, diabetes and obesity. Factors aggravating her hyperlipidemia include beta blockers and fatty foods. Current antihyperlipidemic treatment includes statins. The current treatment provides moderate improvement of lipids. Compliance problems include adherence to diet and adherence to exercise.  Risk factors for coronary artery disease include diabetes mellitus, dyslipidemia, family history, hypertension, obesity, post-menopausal and a sedentary lifestyle.  Hypertension This is a chronic problem. The current episode started more than 1 year ago. The problem has been resolved since onset. The problem is controlled. Associated symptoms include blurred vision. There are no associated agents to hypertension. Risk factors for coronary artery disease include diabetes mellitus, dyslipidemia, family history, obesity and sedentary lifestyle. Past treatments include calcium channel  blockers, angiotensin blockers and diuretics. There are no compliance problems.  Hypertensive end-organ damage includes kidney disease. Identifiable causes of hypertension include chronic renal disease.    Review of systems  Constitutional: + stable body weight,  current Body mass index is 35.75 kg/m. , + fatigue, no subjective hyperthermia, no subjective hypothermia Eyes: no blurry vision, no xerophthalmia ENT: no sore throat, no nodules palpated in throat, no dysphagia/odynophagia, no hoarseness Cardiovascular: no chest pain, no shortness of breath, no palpitations, no leg swelling Respiratory: no cough, no shortness of breath Gastrointestinal: no nausea/vomiting/diarrhea Musculoskeletal: no muscle/joint aches Skin: no rashes, no hyperemia Neurological: no tremors, no numbness, no tingling, no dizziness Psychiatric: no depression, no anxiety   Objective:     BP (!) 158/78 (BP Location: Right Arm, Patient Position: Sitting, Cuff Size: Large) Comment: Retake Manuel Cuff, Patient states that she just took her medication 0 munutes before this office visit. Patient sees Dr. Margo Aye for her HTN. Lowen Mansouri was made aware.  Pulse 60   Ht 5\' 3"  (1.6 m)   Wt 201 lb 12.8 oz (91.5 kg)   BMI 35.75 kg/m   Wt Readings from Last 3 Encounters:  10/03/23 201 lb 12.8 oz (91.5 kg)  06/18/23 200 lb (90.7 kg)  06/17/23 201 lb 6.4 oz (91.4 kg)     BP Readings from Last 3 Encounters:  10/03/23 (!) 158/78  06/18/23 (!) 180/95  06/17/23 (!) 144/80      Physical Exam- Limited  Constitutional:  Body mass index is 35.75 kg/m. , not in acute distress, normal state of mind Eyes:  EOMI, no exophthalmos Musculoskeletal: no gross deformities, strength intact in all four extremities, no gross restriction of joint movements Skin:  no rashes, no hyperemia, Neurological: no tremor with outstretched hands   Diabetic Foot Exam - Simple   Simple Foot Form Diabetic Foot exam was performed with the following  findings: Yes 10/03/2023 10:00 AM  Visual Inspection See comments: Yes Sensation Testing Intact to touch and monofilament testing bilaterally: Yes Pulse Check Posterior Tibialis and Dorsalis pulse intact bilaterally: Yes Comments Dry flaky skin bilaterally, mild onychomycosis bilaterally with nails in need of trim, excoriated area to right ankle and lower leg      CMP ( most recent) CMP     Component Value Date/Time   NA 133 (L) 06/11/2023 1055   NA 140 06/22/2022 0000   K 5.2 (H) 06/11/2023 1055   CL 99 06/11/2023 1055   CO2 23 06/11/2023 1055   GLUCOSE  202 (H) 06/11/2023 1055   BUN 51 (H) 06/11/2023 1055   BUN 39 (A) 06/22/2022 0000   CREATININE 1.94 (H) 06/11/2023 1055   CALCIUM 10.2 06/11/2023 1055   PROT 7.7 06/11/2023 1055   ALBUMIN 3.8 06/11/2023 1055   AST 15 06/11/2023 1055   ALT 14 06/11/2023 1055   ALKPHOS 72 06/11/2023 1055   BILITOT 0.4 06/11/2023 1055   GFRNONAA 25 (L) 06/11/2023 1055   GFRAA 43 (L) 06/01/2020 1259     Diabetic Labs (most recent): Lab Results  Component Value Date   HGBA1C 8.7 (A) 10/03/2023   HGBA1C 10.5 (A) 06/17/2023   HGBA1C 14.2 (A) 02/13/2023     Lipid Panel ( most recent) Lipid Panel     Component Value Date/Time   CHOL 183 06/21/2022 0000   TRIG 166 (A) 06/21/2022 0000   HDL 52 06/21/2022 0000   CHOLHDL 3 02/20/2011 1051   VLDL 27.8 02/20/2011 1051   LDLCALC 102 06/21/2022 0000   LDLDIRECT 168.8 10/19/2008 0914      Lab Results  Component Value Date   TSH 1.07 02/20/2011   TSH 1.70 10/18/2010   TSH 1.49 08/10/2009   TSH 1.23 07/20/2008   TSH 1.39 06/19/2007           Assessment & Plan:   1) Type 2 diabetes mellitus with stage 4 chronic kidney disease, without long-term current use of insulin (HCC)  She presents today, accompanied by her daughter Dois Davenport, with her CGM showing greatly improved glycemic profile overall.  Her POCT A1c today is 8.7%, improving from last visit of 10.5%.  Analysis of her CGM  shows TIR 49%, TAR 47%, TBR 5% with a GMI of 7.7%.  She still forgets her insulin from time to time.  Dois Davenport is planning on moving her and her mom down to [place] within the next several months.    - MONIFA MARTON has currently uncontrolled symptomatic type 2 DM since 82 years of age.   -Recent labs reviewed.  - I had a long discussion with her about the progressive nature of diabetes and the pathology behind its complications. -her diabetes is complicated by CKD and she remains at a high risk for more acute and chronic complications which include CAD, CVA, CKD, retinopathy, and neuropathy. These are all discussed in detail with her.  - Nutritional counseling repeated at each appointment due to patients tendency to fall back in to old habits.  - The patient admits there is a room for improvement in their diet and drink choices. -  Suggestion is made for the patient to avoid simple carbohydrates from their diet including Cakes, Sweet Desserts / Pastries, Ice Cream, Soda (diet and regular), Sweet Tea, Candies, Chips, Cookies, Sweet Pastries, Store Bought Juices, Alcohol in Excess of 1-2 drinks a day, Artificial Sweeteners, Coffee Creamer, and "Sugar-free" Products. This will help patient to have stable blood glucose profile and potentially avoid unintended weight gain.   - I encouraged the patient to switch to unprocessed or minimally processed complex starch and increased protein intake (animal or plant source), fruits, and vegetables.   - Patient is advised to stick to a routine mealtimes to eat 3 meals a day and avoid unnecessary snacks (to snack only to correct hypoglycemia).  - I have approached her with the following individualized plan to manage her diabetes and patient agrees:   -She is advised to lower her Lantus to 22 units SQ nightly and continue Trulicity 1.5 mg SQ weekly.  Will  plan to increase her Trulicity in February to 3 mg SQ weekly (allowing patient to use up some of her Lantus  supply that she has an abundance of).  -she is encouraged to continue monitoring glucose at least twice daily, before breakfast and before bed (using her CGM), and to call the clinic if she has readings less than 70 or above 300 for 3 tests in a row.     - she is warned not to take insulin without proper monitoring per orders. - Adjustment parameters are given to her for hypo and hyperglycemia in writing.  - her Glipizide was discontinued, risk outweighs benefit for this patient given her CKD and advanced age, this medication increases her risk of severe hypoglycemia. - she is not a candidate for Metformin due to concurrent renal insufficiency.  - Specific targets for  A1c; LDL, HDL, and Triglycerides were discussed with the patient.  2) Blood Pressure /Hypertension:  her blood pressure is not controlled to target.   she is advised to continue her current medications as prescribed by her PCP and she does have follow up with Dr. Margo Aye soon.  3) Lipids/Hyperlipidemia:    Review of her recent lipid panel from 02/25/23 showed controlled LDL at 56.  she is advised to continue Zocor 20 mg daily at bedtime.  Side effects and precautions discussed with her.  4)  Weight/Diet:  her Body mass index is 35.75 kg/m.  -  clearly complicating her diabetes care.   she is a candidate for weight loss. I discussed with her the fact that loss of 5 - 10% of her  current body weight will have the most impact on her diabetes management.  Exercise, and detailed carbohydrates information provided  -  detailed on discharge instructions.  5) Chronic Care/Health Maintenance: -she is on ACEI/ARB and Statin medications and is encouraged to initiate and continue to follow up with Ophthalmology, Dentist, Podiatrist at least yearly or according to recommendations, and advised to stay away from smoking. I have recommended yearly flu vaccine and pneumonia vaccine at least every 5 years; moderate intensity exercise for up to 150  minutes weekly; and sleep for at least 7 hours a day.  - she is advised to maintain close follow up with Benita Stabile, MD for primary care needs, as well as her other providers for optimal and coordinated care.      I spent  41  minutes in the care of the patient today including review of labs from CMP, Lipids, Thyroid Function, Hematology (current and previous including abstractions from other facilities); face-to-face time discussing  her blood glucose readings/logs, discussing hypoglycemia and hyperglycemia episodes and symptoms, medications doses, her options of short and long term treatment based on the latest standards of care / guidelines;  discussion about incorporating lifestyle medicine;  and documenting the encounter. Risk reduction counseling performed per USPSTF guidelines to reduce obesity and cardiovascular risk factors.     Please refer to Patient Instructions for Blood Glucose Monitoring and Insulin/Medications Dosing Guide"  in media tab for additional information. Please  also refer to " Patient Self Inventory" in the Media  tab for reviewed elements of pertinent patient history.  Julianne Handler participated in the discussions, expressed understanding, and voiced agreement with the above plans.  All questions were answered to her satisfaction. she is encouraged to contact clinic should she have any questions or concerns prior to her return visit.     Follow up plan: - Return in about  3 months (around 01/01/2024) for Diabetes F/U with A1c in office, No previsit labs, Bring meter and logs.   Ronny Bacon, [facility] [address] [place], [place] [address] Phone: [phone] Fax: [phone]  10/03/2023, 11:21 AM

## 2023-10-04 DIAGNOSIS — E039 Hypothyroidism, unspecified: Secondary | ICD-10-CM | POA: Diagnosis not present

## 2023-10-04 DIAGNOSIS — E079 Disorder of thyroid, unspecified: Secondary | ICD-10-CM | POA: Diagnosis not present

## 2023-10-04 DIAGNOSIS — E069 Thyroiditis, unspecified: Secondary | ICD-10-CM | POA: Diagnosis not present

## 2023-10-10 DIAGNOSIS — E1165 Type 2 diabetes mellitus with hyperglycemia: Secondary | ICD-10-CM | POA: Diagnosis not present

## 2023-10-14 DIAGNOSIS — H348322 Tributary (branch) retinal vein occlusion, left eye, stable: Secondary | ICD-10-CM | POA: Diagnosis not present

## 2023-10-14 DIAGNOSIS — H43813 Vitreous degeneration, bilateral: Secondary | ICD-10-CM | POA: Diagnosis not present

## 2023-10-14 DIAGNOSIS — H35033 Hypertensive retinopathy, bilateral: Secondary | ICD-10-CM | POA: Diagnosis not present

## 2023-10-14 DIAGNOSIS — H43391 Other vitreous opacities, right eye: Secondary | ICD-10-CM | POA: Diagnosis not present

## 2023-10-14 DIAGNOSIS — H353231 Exudative age-related macular degeneration, bilateral, with active choroidal neovascularization: Secondary | ICD-10-CM | POA: Diagnosis not present

## 2023-10-22 DIAGNOSIS — R7989 Other specified abnormal findings of blood chemistry: Secondary | ICD-10-CM | POA: Diagnosis not present

## 2023-10-22 DIAGNOSIS — E119 Type 2 diabetes mellitus without complications: Secondary | ICD-10-CM | POA: Diagnosis not present

## 2023-10-22 DIAGNOSIS — I10 Essential (primary) hypertension: Secondary | ICD-10-CM | POA: Diagnosis not present

## 2023-10-24 DIAGNOSIS — E1122 Type 2 diabetes mellitus with diabetic chronic kidney disease: Secondary | ICD-10-CM | POA: Diagnosis not present

## 2023-10-24 DIAGNOSIS — I10 Essential (primary) hypertension: Secondary | ICD-10-CM | POA: Diagnosis not present

## 2023-11-04 DIAGNOSIS — E1122 Type 2 diabetes mellitus with diabetic chronic kidney disease: Secondary | ICD-10-CM | POA: Diagnosis not present

## 2023-11-07 DIAGNOSIS — E782 Mixed hyperlipidemia: Secondary | ICD-10-CM | POA: Diagnosis not present

## 2023-11-07 DIAGNOSIS — E875 Hyperkalemia: Secondary | ICD-10-CM | POA: Diagnosis not present

## 2023-11-07 DIAGNOSIS — Z741 Need for assistance with personal care: Secondary | ICD-10-CM | POA: Diagnosis not present

## 2023-11-07 DIAGNOSIS — E1122 Type 2 diabetes mellitus with diabetic chronic kidney disease: Secondary | ICD-10-CM | POA: Diagnosis not present

## 2023-11-07 DIAGNOSIS — R296 Repeated falls: Secondary | ICD-10-CM | POA: Diagnosis not present

## 2023-11-07 DIAGNOSIS — F329 Major depressive disorder, single episode, unspecified: Secondary | ICD-10-CM | POA: Diagnosis not present

## 2023-11-07 DIAGNOSIS — I10 Essential (primary) hypertension: Secondary | ICD-10-CM | POA: Diagnosis not present

## 2023-11-07 DIAGNOSIS — F0393 Unspecified dementia, unspecified severity, with mood disturbance: Secondary | ICD-10-CM | POA: Diagnosis not present

## 2023-11-07 DIAGNOSIS — N184 Chronic kidney disease, stage 4 (severe): Secondary | ICD-10-CM | POA: Diagnosis not present

## 2023-11-07 DIAGNOSIS — C50911 Malignant neoplasm of unspecified site of right female breast: Secondary | ICD-10-CM | POA: Diagnosis not present

## 2023-11-07 DIAGNOSIS — R600 Localized edema: Secondary | ICD-10-CM | POA: Diagnosis not present

## 2023-11-10 DIAGNOSIS — E1165 Type 2 diabetes mellitus with hyperglycemia: Secondary | ICD-10-CM | POA: Diagnosis not present

## 2023-12-11 DIAGNOSIS — E1165 Type 2 diabetes mellitus with hyperglycemia: Secondary | ICD-10-CM | POA: Diagnosis not present

## 2023-12-17 ENCOUNTER — Ambulatory Visit (HOSPITAL_COMMUNITY)
Admission: RE | Admit: 2023-12-17 | Discharge: 2023-12-17 | Disposition: A | Discharge status: Home / Self Care | Payer: Medicare HMO | Source: Ambulatory Visit | Attending: Hematology | Admitting: Hematology

## 2023-12-17 ENCOUNTER — Inpatient Hospital Stay: Payer: Medicare HMO | Attending: Hematology

## 2023-12-17 DIAGNOSIS — N189 Chronic kidney disease, unspecified: Secondary | ICD-10-CM | POA: Diagnosis not present

## 2023-12-17 DIAGNOSIS — Z79811 Long term (current) use of aromatase inhibitors: Secondary | ICD-10-CM | POA: Diagnosis not present

## 2023-12-17 DIAGNOSIS — Z170 Estrogen receptor positive status [ER+]: Secondary | ICD-10-CM | POA: Insufficient documentation

## 2023-12-17 DIAGNOSIS — C50911 Malignant neoplasm of unspecified site of right female breast: Secondary | ICD-10-CM | POA: Insufficient documentation

## 2023-12-17 DIAGNOSIS — Z1721 Progesterone receptor positive status: Secondary | ICD-10-CM | POA: Insufficient documentation

## 2023-12-17 DIAGNOSIS — Z1732 Human epidermal growth factor receptor 2 negative status: Secondary | ICD-10-CM | POA: Insufficient documentation

## 2023-12-17 DIAGNOSIS — N6091 Unspecified benign mammary dysplasia of right breast: Secondary | ICD-10-CM | POA: Diagnosis not present

## 2023-12-17 DIAGNOSIS — Z9889 Other specified postprocedural states: Secondary | ICD-10-CM | POA: Diagnosis not present

## 2023-12-17 DIAGNOSIS — D508 Other iron deficiency anemias: Secondary | ICD-10-CM

## 2023-12-17 DIAGNOSIS — C50311 Malignant neoplasm of lower-inner quadrant of right female breast: Secondary | ICD-10-CM | POA: Diagnosis not present

## 2023-12-17 DIAGNOSIS — D509 Iron deficiency anemia, unspecified: Secondary | ICD-10-CM | POA: Insufficient documentation

## 2023-12-17 DIAGNOSIS — R809 Proteinuria, unspecified: Secondary | ICD-10-CM | POA: Diagnosis not present

## 2023-12-17 DIAGNOSIS — E211 Secondary hyperparathyroidism, not elsewhere classified: Secondary | ICD-10-CM | POA: Diagnosis not present

## 2023-12-17 DIAGNOSIS — D631 Anemia in chronic kidney disease: Secondary | ICD-10-CM | POA: Diagnosis not present

## 2023-12-17 DIAGNOSIS — N62 Hypertrophy of breast: Secondary | ICD-10-CM | POA: Diagnosis not present

## 2023-12-17 DIAGNOSIS — R92323 Mammographic fibroglandular density, bilateral breasts: Secondary | ICD-10-CM | POA: Diagnosis not present

## 2023-12-17 DIAGNOSIS — M8580 Other specified disorders of bone density and structure, unspecified site: Secondary | ICD-10-CM | POA: Diagnosis not present

## 2023-12-17 DIAGNOSIS — E559 Vitamin D deficiency, unspecified: Secondary | ICD-10-CM

## 2023-12-17 LAB — CBC WITH DIFFERENTIAL/PLATELET
Abs Immature Granulocytes: 0.03 10*3/uL (ref 0.00–0.07)
Basophils Absolute: 0.1 10*3/uL (ref 0.0–0.1)
Basophils Relative: 1 %
Eosinophils Absolute: 0.2 10*3/uL (ref 0.0–0.5)
Eosinophils Relative: 2 %
HCT: 37.8 % (ref 36.0–46.0)
Hemoglobin: 12.3 g/dL (ref 12.0–15.0)
Immature Granulocytes: 0 %
Lymphocytes Relative: 11 %
Lymphs Abs: 1.1 10*3/uL (ref 0.7–4.0)
MCH: 29.3 pg (ref 26.0–34.0)
MCHC: 32.5 g/dL (ref 30.0–36.0)
MCV: 90 fL (ref 80.0–100.0)
Monocytes Absolute: 0.7 10*3/uL (ref 0.1–1.0)
Monocytes Relative: 7 %
Neutro Abs: 7.8 10*3/uL — ABNORMAL HIGH (ref 1.7–7.7)
Neutrophils Relative %: 79 %
Platelets: 459 10*3/uL — ABNORMAL HIGH (ref 150–400)
RBC: 4.2 MIL/uL (ref 3.87–5.11)
RDW: 13.2 % (ref 11.5–15.5)
WBC: 9.9 10*3/uL (ref 4.0–10.5)
nRBC: 0 % (ref 0.0–0.2)

## 2023-12-17 LAB — COMPREHENSIVE METABOLIC PANEL
ALT: 12 U/L (ref 0–44)
AST: 15 U/L (ref 15–41)
Albumin: 4.1 g/dL (ref 3.5–5.0)
Alkaline Phosphatase: 68 U/L (ref 38–126)
Anion gap: 15 (ref 5–15)
BUN: 53 mg/dL — ABNORMAL HIGH (ref 8–23)
CO2: 22 mmol/L (ref 22–32)
Calcium: 10.1 mg/dL (ref 8.9–10.3)
Chloride: 102 mmol/L (ref 98–111)
Creatinine, Ser: 2.4 mg/dL — ABNORMAL HIGH (ref 0.44–1.00)
GFR, Estimated: 20 mL/min — ABNORMAL LOW (ref >60)
Glucose, Bld: 233 mg/dL — ABNORMAL HIGH (ref 70–99)
Potassium: 4.5 mmol/L (ref 3.5–5.1)
Sodium: 139 mmol/L (ref 135–145)
Total Bilirubin: 1 mg/dL (ref 0.0–1.2)
Total Protein: 7.9 g/dL (ref 6.5–8.1)

## 2023-12-17 LAB — IRON AND TIBC
Iron: 93 ug/dL (ref 28–170)
Saturation Ratios: 22 % (ref 10.4–31.8)
TIBC: 422 ug/dL (ref 250–450)
UIBC: 329 ug/dL

## 2023-12-17 LAB — FERRITIN: Ferritin: 39 ng/mL (ref 11–307)

## 2023-12-17 LAB — VITAMIN D 25 HYDROXY (VIT D DEFICIENCY, FRACTURES): Vit D, 25-Hydroxy: 35.7 ng/mL (ref 30–100)

## 2023-12-24 ENCOUNTER — Inpatient Hospital Stay (HOSPITAL_BASED_OUTPATIENT_CLINIC_OR_DEPARTMENT_OTHER): Payer: Medicare HMO | Admitting: Hematology

## 2023-12-24 ENCOUNTER — Other Ambulatory Visit (HOSPITAL_COMMUNITY): Payer: Self-pay | Admitting: Hematology

## 2023-12-24 VITALS — BP 135/81 | HR 80 | Temp 97.8°F | Resp 20 | Wt 192.5 lb

## 2023-12-24 DIAGNOSIS — Z853 Personal history of malignant neoplasm of breast: Secondary | ICD-10-CM

## 2023-12-24 DIAGNOSIS — Z1721 Progesterone receptor positive status: Secondary | ICD-10-CM | POA: Diagnosis not present

## 2023-12-24 DIAGNOSIS — N189 Chronic kidney disease, unspecified: Secondary | ICD-10-CM | POA: Diagnosis not present

## 2023-12-24 DIAGNOSIS — M8580 Other specified disorders of bone density and structure, unspecified site: Secondary | ICD-10-CM | POA: Diagnosis not present

## 2023-12-24 DIAGNOSIS — D509 Iron deficiency anemia, unspecified: Secondary | ICD-10-CM | POA: Diagnosis not present

## 2023-12-24 DIAGNOSIS — D631 Anemia in chronic kidney disease: Secondary | ICD-10-CM | POA: Diagnosis not present

## 2023-12-24 DIAGNOSIS — D508 Other iron deficiency anemias: Secondary | ICD-10-CM

## 2023-12-24 DIAGNOSIS — Z1732 Human epidermal growth factor receptor 2 negative status: Secondary | ICD-10-CM | POA: Diagnosis not present

## 2023-12-24 DIAGNOSIS — Z170 Estrogen receptor positive status [ER+]: Secondary | ICD-10-CM | POA: Diagnosis not present

## 2023-12-24 DIAGNOSIS — Z79811 Long term (current) use of aromatase inhibitors: Secondary | ICD-10-CM | POA: Diagnosis not present

## 2023-12-24 DIAGNOSIS — C50911 Malignant neoplasm of unspecified site of right female breast: Secondary | ICD-10-CM | POA: Diagnosis not present

## 2023-12-24 NOTE — Patient Instructions (Addendum)
 [facility] at [facility] Discharge Instructions   You were seen and examined today by Dr. Ellin Saba.  He reviewed the results of your lab work which are mostly normal/stable. Your iron was low. Dr. Kirtland Bouchard recommends you take an over the counter iron supplement (325 mg) three days a week.   Your mammogram was stable in the right breast and nothing was seen in the left breast.   We will see you back in 6 months. We will repeat lab work and a mammogram prior to this visit.   Return as scheduled.    Thank you for choosing [facility] at [facility] to provide your oncology and hematology care.  To afford each patient quality time with our provider, please arrive at least 15 minutes before your scheduled appointment time.   If you have a lab appointment with the Cancer Center please come in thru the Main Entrance and check in at the main information desk.  You need to re-schedule your appointment should you arrive 10 or more minutes late.  We strive to give you quality time with our providers, and arriving late affects you and other patients whose appointments are after yours.  Also, if you no show three or more times for appointments you may be dismissed from the clinic at the providers discretion.     Again, thank you for choosing [facility].  Our hope is that these requests will decrease the amount of time that you wait before being seen by our physicians.       _____________________________________________________________  Should you have questions after your visit to [facility], please contact our office at [phone] and follow the prompts.  Our office hours are 8:00 a.m. and 4:30 p.m. Monday - Friday.  Please note that voicemails left after 4:00 p.m. may not be returned until the following business day.  We are closed weekends and major holidays.  You do have access to a nurse 24-7, just call the main number to the  clinic [phone] and do not press any options, hold on the line and a nurse will answer the phone.    For prescription refill requests, have your pharmacy contact our office and allow 72 hours.    Due to Covid, you will need to wear a mask upon entering the hospital. If you do not have a mask, a mask will be given to you at the Main Entrance upon arrival. For doctor visits, patients may have 1 support person age 76 or older with them. For treatment visits, patients can not have anyone with them due to social distancing guidelines and our immunocompromised population.

## 2023-12-24 NOTE — Progress Notes (Signed)
 [facility] [address] [place] [address]    Clinic Day:  12/24/23   Referring physician: Benita Stabile, MD  Patient Care Team: Benita Stabile, MD as PCP - General (Internal Medicine)   ASSESSMENT & PLAN:   Assessment: 1.  Right breast invasive lobular carcinoma: -Stereotactic biopsy on 06/18/2019 showed ILC in the right breast lower inner quadrant.  90% positive for ER/PR.  Ki-67 was 5%.  Tumor negative for HER-2. -She refused surgical intervention. -MRI of the breast on 08/24/2019 showed biopsy-proven right breast invasive lobular carcinoma and atypical lobular hyperplasia.  No additional suspicious enhancements. -She was started on anastrozole on 08/24/2019. -MRI of the breast on 02/19/2020 showed treatment response with enhancement at the site of atypical lobular hyperplasia in the upper inner quadrant of the right breast measuring 0.9 x 0.5 cm (previously 1.3 x 0.9 cm).  Enhancement at the site of known invasive lobular carcinoma in the lower inner quadrant of the right breast has resolved completely.  Left breast remains negative.  No adenopathy. -MRI of the breast on 08/29/2020 shows non-mass enhancement involving the lower outer quadrant at anterior depth spanning 9 x 8 x 3 mm demonstrating progressive enhancement kinetics, new since prior MRI in April.  No mammographic correlate.  No abnormal enhancement at the clip biopsy site.  Left breast has no lesions.   2.  Osteopenia: -DEXA scan on 07/20/2019 shows T score of -1.9.   3.  CKD: -Creatinine is stable around 1.4.  Plan: 1.  Right breast invasive lobular carcinoma: - She is tolerating anastrozole fairly well.  She occasionally forgets to take anastrozole per daughter. - Labs from 12/17/2023: Normal LFTs.  CBC grossly normal with mildly elevated platelet count. - Physical exam: No palpable masses or adenopathy in the bilateral breast. - Mammogram (12/17/2023): Stable mammographic appearance of the right  breast with no evidence of malignancy in the left breast. - Recommend continuing anastrozole daily.  RTC 6 months for follow-up with right breast diagnostic mammogram and labs.   2.  Osteopenia: - She is not taking vitamin D.  Vitamin D level is normal at 35.   3.  Normocytic anemia: - Mild anemia from CKD and functional iron deficiency. - Ferritin is 39 and percent saturation 22.  Hemoglobin is 12.3. - Recommend starting iron supplements 3 times a week with stool softener.  Will repeat ferritin and iron panel in 6 months.     Breast Cancer therapy associated bone loss: I have recommended calcium, Vitamin D and weight bearing exercises.    Orders Placed This Encounter  Procedures   MM DIAG BREAST TOMO UNI RIGHT    Standing Status:   Future    Expected Date:   06/22/2024    Expiration Date:   12/23/2024    Reason for Exam (SYMPTOM  OR DIAGNOSIS REQUIRED):   right breast cancer    Preferred imaging location?:   [facility]   CBC with Differential    Standing Status:   Future    Expected Date:   06/29/2024    Expiration Date:   12/23/2024   Comprehensive metabolic panel    Standing Status:   Future    Expected Date:   06/29/2024    Expiration Date:   12/23/2024   Iron and TIBC ([facility] ONLY)    Standing Status:   Future    Expected Date:   06/29/2024    Expiration Date:   12/23/2024   Ferritin  Standing Status:   Future    Expected Date:   06/29/2024    Expiration Date:   12/23/2024       Alben Deeds Teague,acting as a scribe for Doreatha Massed, MD.,have documented all relevant documentation on the behalf of Doreatha Massed, MD,as directed by  Doreatha Massed, MD while in the presence of Doreatha Massed, MD.  I, Doreatha Massed MD, have reviewed the above documentation for accuracy and completeness, and I agree with the above.     Doreatha Massed, MD   2/25/20252:26 PM  CHIEF COMPLAINT:   Diagnosis:  right breast cancer      Cancer Staging  No matching staging information was found for the patient.    Prior Therapy: None  Current Therapy:  Anastrozole   HISTORY OF PRESENT ILLNESS:   Oncology History   No history exists.     INTERVAL HISTORY:   Krista Payne is a 83 y.o. female presenting to clinic today for follow up of right breast cancer. She was last seen by me on 06/18/23.  Since her last visit, she underwent a bilateral MM on 12/17/23 that found: Stable mammographic appearance of the right breast. No mammographic evidence of malignancy in the left breast.   Today, she states that she is doing well overall. Her appetite level is at 100%. Her energy level is at 100%. She is accompanied by her daughter. She is tolerating Anastrozole well and is semi-compliant in taking the medication. Her daughter state she fills her medications for her and says Krista Payne has not taken any medications for 2 weeks. She denies any bone aches/pains, hot flashes, or night sweats.   Krista Payne does not take iron pills, though she has tolerated it well in the past. She does not take Vitamin D.  PAST MEDICAL HISTORY:   Past Medical History: Past Medical History:  Diagnosis Date   Breast cancer (HCC) 2020   lobular cancer right breast   Chronic kidney disease    Diabetes mellitus without complication (HCC)    Hyperlipidemia    Hypertension    Macular degeneration    Osteoarthritis     Surgical History: Past Surgical History:  Procedure Laterality Date   BREAST BIOPSY Right 2020   lobular ca   CATARACT EXTRACTION W/PHACO Right 02/07/2015   Procedure: CATARACT EXTRACTION PHACO AND INTRAOCULAR LENS PLACEMENT (IOC);  Surgeon: Gemma Payor, MD;  Location: [facility];  Service: Ophthalmology;  Laterality: Right;  CDE:12.72   CATARACT EXTRACTION W/PHACO Left 02/24/2015   Procedure: CATARACT EXTRACTION PHACO AND INTRAOCULAR LENS PLACEMENT LEFT EYE;  Surgeon: Gemma Payor, MD;  Location: [facility];  Service: Ophthalmology;  Laterality: Left;   CDE 11.69   COLONOSCOPY      Social History: Social History   Socioeconomic History   Marital status: Widowed    Spouse name: Not on file   Number of children: 3   Years of education: 46   Highest education level: 12th grade  Occupational History   Not on file  Tobacco Use   Smoking status: Never    Passive exposure: Never   Smokeless tobacco: Never  Vaping Use   Vaping status: Never Used  Substance and Sexual Activity   Alcohol use: No   Drug use: No   Sexual activity: Not Currently    Partners: Male  Other Topics Concern   Not on file  Social History Narrative   Not on file   Social Drivers of Health   Financial Resource Strain: Low Risk  (07/19/2022)  Overall Financial Resource Strain (CARDIA)    Difficulty of Paying Living Expenses: Not hard at all  Food Insecurity: No Food Insecurity (07/19/2022)   Hunger Vital Sign    Worried About Running Out of Food in the Last Year: Never true    Ran Out of Food in the Last Year: Never true  Transportation Needs: No Transportation Needs (07/19/2022)   PRAPARE - Administrator, Civil Service (Medical): No    Lack of Transportation (Non-Medical): No  Physical Activity: Inactive (07/19/2022)   Exercise Vital Sign    Days of Exercise per Week: 0 days    Minutes of Exercise per Session: 0 min  Stress: No Stress Concern Present (07/19/2022)   [employer] of Occupational Health - Occupational Stress Questionnaire    Feeling of Stress : Not at all  Social Connections: Moderately Integrated (07/19/2022)   Social Connection and Isolation Panel [NHANES]    Frequency of Communication with Friends and Family: More than three times a week    Frequency of Social Gatherings with Friends and Family: More than three times a week    Attends Religious Services: More than 4 times per year    Active Member of [employer] or Organizations: Yes    Attends Banker Meetings: More than 4 times per year    Marital Status:  Widowed  Intimate Partner Violence: Not At Risk (07/19/2022)   Humiliation, Afraid, Rape, and Kick questionnaire    Fear of Current or Ex-Partner: No    Emotionally Abused: No    Physically Abused: No    Sexually Abused: No    Family History: Family History  Problem Relation Age of Onset   Macular degeneration Mother    Diabetes Father    Heart disease Daughter     Current Medications:  Current Outpatient Medications:    amLODipine (NORVASC) 5 MG tablet, Take 0.5 tablets (2.5 mg total) by mouth daily., Disp: , Rfl:    anastrozole (ARIMIDEX) 1 MG tablet, Take 1 tablet (1 mg total) by mouth daily., Disp: 90 tablet, Rfl: 3   atenolol (TENORMIN) 25 MG tablet, Take 25 mg by mouth daily., Disp: , Rfl:    augmented betamethasone dipropionate (DIPROLENE-AF) 0.05 % cream, Apply topically 2 (two) times daily., Disp: , Rfl:    buPROPion (WELLBUTRIN XL) 150 MG 24 hr tablet, Take 300 mg by mouth daily as needed (feeling moody/depressed)., Disp: , Rfl:    diclofenac Sodium (VOLTAREN) 1 % GEL, Apply topically 4 (four) times daily., Disp: , Rfl:    Dulaglutide (TRULICITY) 3 MG/0.5ML SOAJ, Inject 3 mg as directed once a week., Disp: 6 mL, Rfl: 1   furosemide (LASIX) 20 MG tablet, Take 10 mg by mouth daily as needed for fluid., Disp: , Rfl:    melatonin 3 MG TABS tablet, Take 3 mg by mouth at bedtime., Disp: , Rfl:    Multiple Vitamins-Minerals (OCUVITE PRESERVISION PO), Take by mouth., Disp: , Rfl:    simvastatin (ZOCOR) 20 MG tablet, Take 1 tablet (20 mg total) by mouth at bedtime., Disp: 90 tablet, Rfl: 3   Insulin Pen Needle (PEN NEEDLES) 31G X 6 MM MISC, Use to inject insulin once daily, Disp: 100 each, Rfl: 3   Allergies: No Known Allergies  REVIEW OF SYSTEMS:   Review of Systems  Constitutional:  Negative for chills, fatigue and fever.  HENT:   Negative for lump/mass, mouth sores, nosebleeds, sore throat and trouble swallowing.   Eyes:  Negative for eye problems.  Respiratory:  Negative  for cough and shortness of breath.   Cardiovascular:  Negative for chest pain, leg swelling and palpitations.  Gastrointestinal:  Negative for abdominal pain, constipation, diarrhea, nausea and vomiting.  Genitourinary:  Negative for bladder incontinence, difficulty urinating, dysuria, frequency, hematuria and nocturia.   Musculoskeletal:  Negative for arthralgias, back pain, flank pain, myalgias and neck pain.  Skin:  Negative for itching and rash.  Neurological:  Negative for dizziness, headaches and numbness.  Hematological:  Does not bruise/bleed easily.  Psychiatric/Behavioral:  Negative for depression, sleep disturbance and suicidal ideas. The patient is not nervous/anxious.   All other systems reviewed and are negative.    VITALS:   Blood pressure 135/81, pulse 80, temperature 97.8 F (36.6 C), temperature source Tympanic, resp. rate 20, weight 192 lb 7.4 oz (87.3 kg), SpO2 96%.  Wt Readings from Last 3 Encounters:  12/24/23 192 lb 7.4 oz (87.3 kg)  10/03/23 201 lb 12.8 oz (91.5 kg)  06/18/23 200 lb (90.7 kg)    Body mass index is 34.09 kg/m.  Performance status (ECOG): 1 - Symptomatic but completely ambulatory  PHYSICAL EXAM:   Physical Exam Vitals and nursing note reviewed. Exam conducted with a chaperone present.  Constitutional:      Appearance: Normal appearance.  Cardiovascular:     Rate and Rhythm: Normal rate and regular rhythm.     Pulses: Normal pulses.     Heart sounds: Normal heart sounds.  Pulmonary:     Effort: Pulmonary effort is normal.     Breath sounds: Normal breath sounds.  Abdominal:     Palpations: Abdomen is soft. There is no hepatomegaly, splenomegaly or mass.     Tenderness: There is no abdominal tenderness.  Musculoskeletal:     Right lower leg: No edema.     Left lower leg: No edema.  Lymphadenopathy:     Cervical: No cervical adenopathy.     Right cervical: No superficial, deep or posterior cervical adenopathy.    Left cervical: No  superficial, deep or posterior cervical adenopathy.     Upper Body:     Right upper body: No supraclavicular or axillary adenopathy.     Left upper body: No supraclavicular or axillary adenopathy.  Neurological:     General: No focal deficit present.     Mental Status: She is alert and oriented to person, place, and time.  Psychiatric:        Mood and Affect: Mood normal.        Behavior: Behavior normal.   Breast Exam Chaperone: Chapman Moss, RN   LABS:      Latest Ref Rng & Units 12/17/2023   10:45 AM 06/11/2023   10:55 AM 12/04/2022    9:44 AM  CBC  WBC 4.0 - 10.5 K/uL 9.9  10.5  10.0   Hemoglobin 12.0 - 15.0 g/dL 40.9  81.1  91.4   Hematocrit 36.0 - 46.0 % 37.8  37.4  35.2   Platelets 150 - 400 K/uL 459  415  447       Latest Ref Rng & Units 12/17/2023   10:45 AM 06/11/2023   10:55 AM 12/04/2022    9:45 AM  CMP  Glucose 70 - 99 mg/dL 782  956  213   BUN 8 - 23 mg/dL 53  51  39   Creatinine 0.44 - 1.00 mg/dL 0.86  5.78  4.69   Sodium 135 - 145 mmol/L 139  133  135   Potassium 3.5 -  5.1 mmol/L 4.5  5.2  4.9   Chloride 98 - 111 mmol/L 102  99  103   CO2 22 - 32 mmol/L 22  23  22    Calcium 8.9 - 10.3 mg/dL 40.9  81.1  9.5   Total Protein 6.5 - 8.1 g/dL 7.9  7.7  7.7   Total Bilirubin 0.0 - 1.2 mg/dL 1.0  0.4  0.7   Alkaline Phos 38 - 126 U/L 68  72  79   AST 15 - 41 U/L 15  15  16    ALT 0 - 44 U/L 12  14  11       No results found for: "CEA1", "CEA" / No results found for: "CEA1", "CEA" No results found for: "PSA1" No results found for: "BJY782" No results found for: "CAN125"  No results found for: "TOTALPROTELP", "ALBUMINELP", "A1GS", "A2GS", "BETS", "BETA2SER", "GAMS", "MSPIKE", "SPEI" Lab Results  Component Value Date   TIBC 422 12/17/2023   TIBC 402 06/11/2023   FERRITIN 39 12/17/2023   FERRITIN 50 06/11/2023   FERRITIN 224 11/15/2021   IRONPCTSAT 22 12/17/2023   IRONPCTSAT 14 06/11/2023   No results found for: "LDH"   STUDIES:   MM DIAG BREAST TOMO  BILATERAL Result Date: 12/17/2023 CLINICAL DATA:  83 year old female with biopsy proven grade 2 invasive lobular carcinoma at site of X shaped biopsy marking clip in the lower inner right breast and atypical lobular hyperplasia at site of coil shaped biopsy marking clip in the upper inner right breast presents for routine follow-up. The patient has opted for medical therapy only and is currently taking anastrozole. EXAM: DIGITAL DIAGNOSTIC BILATERAL MAMMOGRAM WITH TOMOSYNTHESIS AND CAD TECHNIQUE: Bilateral digital diagnostic mammography and breast tomosynthesis was performed. The images were evaluated with computer-aided detection. COMPARISON:  Previous exam(s). ACR Breast Density Category b: There are scattered areas of fibroglandular density. FINDINGS: Stable appearance of coil and X shaped biopsy marking clips in the inner right breast with unchanged minimal associated asymmetry associated with the epi AP biopsy marking clip. No new masses or abnormality seen in the right breast. No suspicious masses or calcifications seen in the left breast. IMPRESSION: 1.  Stable mammographic appearance of the right breast. 2.  No mammographic evidence of malignancy in the left breast. RECOMMENDATION: Diagnostic mammography every 6-12 months or as clinically indicated. I have discussed the findings and recommendations with the patient. If applicable, a reminder letter will be sent to the patient regarding the next appointment. BI-RADS CATEGORY  6: Known biopsy-proven malignancy. Electronically Signed   By: Edwin Cap M.D.   On: 12/17/2023 10:33

## 2024-01-03 ENCOUNTER — Other Ambulatory Visit (HOSPITAL_COMMUNITY): Payer: Self-pay | Admitting: Nephrology

## 2024-01-03 DIAGNOSIS — I129 Hypertensive chronic kidney disease with stage 1 through stage 4 chronic kidney disease, or unspecified chronic kidney disease: Secondary | ICD-10-CM

## 2024-01-03 DIAGNOSIS — E1122 Type 2 diabetes mellitus with diabetic chronic kidney disease: Secondary | ICD-10-CM

## 2024-01-03 DIAGNOSIS — N179 Acute kidney failure, unspecified: Secondary | ICD-10-CM | POA: Diagnosis not present

## 2024-01-03 DIAGNOSIS — I5032 Chronic diastolic (congestive) heart failure: Secondary | ICD-10-CM | POA: Diagnosis not present

## 2024-01-06 ENCOUNTER — Telehealth: Payer: Self-pay | Admitting: Nurse Practitioner

## 2024-01-06 DIAGNOSIS — H35033 Hypertensive retinopathy, bilateral: Secondary | ICD-10-CM | POA: Diagnosis not present

## 2024-01-06 DIAGNOSIS — H43391 Other vitreous opacities, right eye: Secondary | ICD-10-CM | POA: Diagnosis not present

## 2024-01-06 DIAGNOSIS — H348322 Tributary (branch) retinal vein occlusion, left eye, stable: Secondary | ICD-10-CM | POA: Diagnosis not present

## 2024-01-06 DIAGNOSIS — H353231 Exudative age-related macular degeneration, bilateral, with active choroidal neovascularization: Secondary | ICD-10-CM | POA: Diagnosis not present

## 2024-01-06 DIAGNOSIS — H43813 Vitreous degeneration, bilateral: Secondary | ICD-10-CM | POA: Diagnosis not present

## 2024-01-06 NOTE — Telephone Encounter (Signed)
 Pt's daughter called and said she is canceling her appt Thursday due to medicare and her A1c. She said she will bring her meter by one day. She said her mom is not taking her medicine or bathing. She states she will be moving her to [place] in may most likely.

## 2024-01-06 NOTE — Telephone Encounter (Signed)
 Oh, I hate that she is not doing well.  But that's fine, I am here if they decide they need me moving forward.

## 2024-01-08 DIAGNOSIS — E1165 Type 2 diabetes mellitus with hyperglycemia: Secondary | ICD-10-CM | POA: Diagnosis not present

## 2024-01-09 ENCOUNTER — Ambulatory Visit: Payer: Medicare HMO | Admitting: Nurse Practitioner

## 2024-01-15 NOTE — Telephone Encounter (Signed)
 I sent in refill request via [employer] to [employer] for her Dexcom sensors.  She can stop by and I can look at the meter whenever.

## 2024-01-15 NOTE — Telephone Encounter (Signed)
 Pt's daughter is asking can she drop off the meter Monday and is asking for a refill on her sensors for dexcom

## 2024-01-20 ENCOUNTER — Other Ambulatory Visit: Payer: Self-pay | Admitting: Nurse Practitioner

## 2024-01-20 ENCOUNTER — Telehealth: Payer: Self-pay | Admitting: *Deleted

## 2024-01-20 DIAGNOSIS — E1151 Type 2 diabetes mellitus with diabetic peripheral angiopathy without gangrene: Secondary | ICD-10-CM | POA: Diagnosis not present

## 2024-01-20 DIAGNOSIS — L851 Acquired keratosis [keratoderma] palmaris et plantaris: Secondary | ICD-10-CM | POA: Diagnosis not present

## 2024-01-20 DIAGNOSIS — B351 Tinea unguium: Secondary | ICD-10-CM | POA: Diagnosis not present

## 2024-01-20 DIAGNOSIS — M79675 Pain in left toe(s): Secondary | ICD-10-CM | POA: Diagnosis not present

## 2024-01-20 MED ORDER — TRULICITY 3 MG/0.5ML ~~LOC~~ SOAJ: 3.0000 mg | SUBCUTANEOUS | 1 refills | Status: AC

## 2024-01-20 NOTE — Telephone Encounter (Signed)
 Patient's daughter presented to the office with the patient's CGM. Requesting that the patient be worked in for OV or just do a A1C. Patient is not with the daughter. It has been explained to the daughter that we could download CGM, that we had no appointments available. She shared with the front staff that the patient has not been taking her medications.

## 2024-01-20 NOTE — Telephone Encounter (Signed)
 Whitney talked with the patient and her daughter.

## 2024-02-03 DIAGNOSIS — Z1379 Encounter for other screening for genetic and chromosomal anomalies: Secondary | ICD-10-CM | POA: Diagnosis not present

## 2024-02-08 DIAGNOSIS — E1165 Type 2 diabetes mellitus with hyperglycemia: Secondary | ICD-10-CM | POA: Diagnosis not present

## 2024-02-14 ENCOUNTER — Ambulatory Visit (HOSPITAL_COMMUNITY)
Admission: RE | Admit: 2024-02-14 | Discharge: 2024-02-14 | Disposition: A | Discharge status: Home / Self Care | Source: Ambulatory Visit | Attending: Nephrology | Admitting: Nephrology

## 2024-02-14 DIAGNOSIS — E1122 Type 2 diabetes mellitus with diabetic chronic kidney disease: Secondary | ICD-10-CM | POA: Diagnosis not present

## 2024-02-14 DIAGNOSIS — I129 Hypertensive chronic kidney disease with stage 1 through stage 4 chronic kidney disease, or unspecified chronic kidney disease: Secondary | ICD-10-CM | POA: Insufficient documentation

## 2024-02-27 DIAGNOSIS — E875 Hyperkalemia: Secondary | ICD-10-CM | POA: Diagnosis not present

## 2024-02-27 DIAGNOSIS — N184 Chronic kidney disease, stage 4 (severe): Secondary | ICD-10-CM | POA: Diagnosis not present

## 2024-02-27 DIAGNOSIS — I5032 Chronic diastolic (congestive) heart failure: Secondary | ICD-10-CM | POA: Diagnosis not present

## 2024-02-27 DIAGNOSIS — N179 Acute kidney failure, unspecified: Secondary | ICD-10-CM | POA: Diagnosis not present

## 2024-03-04 ENCOUNTER — Telehealth: Payer: Self-pay | Admitting: *Deleted

## 2024-03-04 NOTE — Telephone Encounter (Signed)
 Patient's daughter and POA called to make us  aware that patient will be moving to [place]  with her, however will keep lab and mammography appointment on 8/19.  If normal, will make her a telephone follow up visit with Dr. Cheree Cords.  They plan to switch her Medicare to [place]  in October and will need to establish with an oncologist there for her 6 month follow up.

## 2024-03-13 ENCOUNTER — Encounter: Payer: Self-pay | Admitting: Nurse Practitioner

## 2024-04-13 DIAGNOSIS — N184 Chronic kidney disease, stage 4 (severe): Secondary | ICD-10-CM | POA: Diagnosis not present

## 2024-04-13 DIAGNOSIS — E1165 Type 2 diabetes mellitus with hyperglycemia: Secondary | ICD-10-CM | POA: Diagnosis not present

## 2024-04-13 DIAGNOSIS — G629 Polyneuropathy, unspecified: Secondary | ICD-10-CM | POA: Diagnosis not present

## 2024-04-13 DIAGNOSIS — E559 Vitamin D deficiency, unspecified: Secondary | ICD-10-CM | POA: Diagnosis not present

## 2024-04-13 DIAGNOSIS — N3000 Acute cystitis without hematuria: Secondary | ICD-10-CM | POA: Diagnosis not present

## 2024-05-04 DIAGNOSIS — G35 Multiple sclerosis: Secondary | ICD-10-CM | POA: Diagnosis not present

## 2024-05-07 ENCOUNTER — Other Ambulatory Visit: Payer: Self-pay | Admitting: *Deleted

## 2024-05-07 DIAGNOSIS — C50911 Malignant neoplasm of unspecified site of right female breast: Secondary | ICD-10-CM

## 2024-05-07 MED ORDER — ANASTROZOLE 1 MG PO TABS: 1.0000 mg | ORAL_TABLET | Freq: Every day | ORAL | 3 refills | Status: DC

## 2024-05-23 DIAGNOSIS — R700 Elevated erythrocyte sedimentation rate: Secondary | ICD-10-CM | POA: Diagnosis not present

## 2024-05-26 DIAGNOSIS — N390 Urinary tract infection, site not specified: Secondary | ICD-10-CM | POA: Diagnosis not present

## 2024-06-02 DIAGNOSIS — C009 Malignant neoplasm of lip, unspecified: Secondary | ICD-10-CM | POA: Diagnosis not present

## 2024-06-09 DIAGNOSIS — G35 Multiple sclerosis: Secondary | ICD-10-CM | POA: Diagnosis not present

## 2024-06-16 ENCOUNTER — Inpatient Hospital Stay: Payer: Medicare HMO | Attending: Oncology

## 2024-06-16 ENCOUNTER — Ambulatory Visit (HOSPITAL_COMMUNITY)
Admission: RE | Admit: 2024-06-16 | Discharge: 2024-06-16 | Disposition: A | Discharge status: Home / Self Care | Payer: Medicare HMO | Source: Ambulatory Visit | Attending: Hematology | Admitting: Hematology

## 2024-06-16 DIAGNOSIS — D631 Anemia in chronic kidney disease: Secondary | ICD-10-CM | POA: Diagnosis not present

## 2024-06-16 DIAGNOSIS — Z853 Personal history of malignant neoplasm of breast: Secondary | ICD-10-CM | POA: Insufficient documentation

## 2024-06-16 DIAGNOSIS — D509 Iron deficiency anemia, unspecified: Secondary | ICD-10-CM | POA: Diagnosis not present

## 2024-06-16 DIAGNOSIS — D508 Other iron deficiency anemias: Secondary | ICD-10-CM

## 2024-06-16 DIAGNOSIS — Z1732 Human epidermal growth factor receptor 2 negative status: Secondary | ICD-10-CM | POA: Diagnosis not present

## 2024-06-16 DIAGNOSIS — H43813 Vitreous degeneration, bilateral: Secondary | ICD-10-CM | POA: Diagnosis not present

## 2024-06-16 DIAGNOSIS — H35033 Hypertensive retinopathy, bilateral: Secondary | ICD-10-CM | POA: Diagnosis not present

## 2024-06-16 DIAGNOSIS — Z170 Estrogen receptor positive status [ER+]: Secondary | ICD-10-CM | POA: Insufficient documentation

## 2024-06-16 DIAGNOSIS — M8580 Other specified disorders of bone density and structure, unspecified site: Secondary | ICD-10-CM | POA: Diagnosis not present

## 2024-06-16 DIAGNOSIS — Z1721 Progesterone receptor positive status: Secondary | ICD-10-CM | POA: Diagnosis not present

## 2024-06-16 DIAGNOSIS — C50911 Malignant neoplasm of unspecified site of right female breast: Secondary | ICD-10-CM | POA: Diagnosis not present

## 2024-06-16 DIAGNOSIS — N184 Chronic kidney disease, stage 4 (severe): Secondary | ICD-10-CM | POA: Insufficient documentation

## 2024-06-16 DIAGNOSIS — H43391 Other vitreous opacities, right eye: Secondary | ICD-10-CM | POA: Diagnosis not present

## 2024-06-16 DIAGNOSIS — Z79811 Long term (current) use of aromatase inhibitors: Secondary | ICD-10-CM | POA: Diagnosis not present

## 2024-06-16 DIAGNOSIS — H353231 Exudative age-related macular degeneration, bilateral, with active choroidal neovascularization: Secondary | ICD-10-CM | POA: Diagnosis not present

## 2024-06-16 DIAGNOSIS — H348322 Tributary (branch) retinal vein occlusion, left eye, stable: Secondary | ICD-10-CM | POA: Diagnosis not present

## 2024-06-16 LAB — CBC WITH DIFFERENTIAL/PLATELET
Abs Immature Granulocytes: 0.04 K/uL (ref 0.00–0.07)
Basophils Absolute: 0.1 K/uL (ref 0.0–0.1)
Basophils Relative: 1 %
Eosinophils Absolute: 0.4 K/uL (ref 0.0–0.5)
Eosinophils Relative: 4 %
HCT: 39 % (ref 36.0–46.0)
Hemoglobin: 12.7 g/dL (ref 12.0–15.0)
Immature Granulocytes: 0 %
Lymphocytes Relative: 12 %
Lymphs Abs: 1 K/uL (ref 0.7–4.0)
MCH: 30.3 pg (ref 26.0–34.0)
MCHC: 32.6 g/dL (ref 30.0–36.0)
MCV: 93.1 fL (ref 80.0–100.0)
Monocytes Absolute: 0.9 K/uL (ref 0.1–1.0)
Monocytes Relative: 10 %
Neutro Abs: 6.5 K/uL (ref 1.7–7.7)
Neutrophils Relative %: 73 %
Platelets: 399 K/uL (ref 150–400)
RBC: 4.19 MIL/uL (ref 3.87–5.11)
RDW: 13.2 % (ref 11.5–15.5)
WBC: 9 K/uL (ref 4.0–10.5)
nRBC: 0 % (ref 0.0–0.2)

## 2024-06-16 LAB — COMPREHENSIVE METABOLIC PANEL WITH GFR
ALT: 14 U/L (ref 0–44)
AST: 17 U/L (ref 15–41)
Albumin: 3.9 g/dL (ref 3.5–5.0)
Alkaline Phosphatase: 67 U/L (ref 38–126)
Anion gap: 16 — ABNORMAL HIGH (ref 5–15)
BUN: 51 mg/dL — ABNORMAL HIGH (ref 8–23)
CO2: 25 mmol/L (ref 22–32)
Calcium: 10.3 mg/dL (ref 8.9–10.3)
Chloride: 96 mmol/L — ABNORMAL LOW (ref 98–111)
Creatinine, Ser: 2.51 mg/dL — ABNORMAL HIGH (ref 0.44–1.00)
GFR, Estimated: 19 mL/min — ABNORMAL LOW (ref >60)
Glucose, Bld: 221 mg/dL — ABNORMAL HIGH (ref 70–99)
Potassium: 4.5 mmol/L (ref 3.5–5.1)
Sodium: 137 mmol/L (ref 135–145)
Total Bilirubin: 0.9 mg/dL (ref 0.0–1.2)
Total Protein: 7.5 g/dL (ref 6.5–8.1)

## 2024-06-16 LAB — IRON AND TIBC
Iron: 94 ug/dL (ref 28–170)
Saturation Ratios: 25 % (ref 10.4–31.8)
TIBC: 378 ug/dL (ref 250–450)
UIBC: 284 ug/dL

## 2024-06-16 LAB — FERRITIN: Ferritin: 94 ng/mL (ref 11–307)

## 2024-06-18 DIAGNOSIS — E211 Secondary hyperparathyroidism, not elsewhere classified: Secondary | ICD-10-CM | POA: Diagnosis not present

## 2024-06-18 DIAGNOSIS — D631 Anemia in chronic kidney disease: Secondary | ICD-10-CM | POA: Diagnosis not present

## 2024-06-18 DIAGNOSIS — N189 Chronic kidney disease, unspecified: Secondary | ICD-10-CM | POA: Diagnosis not present

## 2024-06-18 DIAGNOSIS — R809 Proteinuria, unspecified: Secondary | ICD-10-CM | POA: Diagnosis not present

## 2024-06-22 NOTE — Progress Notes (Unsigned)
 VIRTUAL VISIT via TELEPHONE NOTE [facility]   I connected with Krista Payne  on 06/23/24 at  8:40 AM by telephone and verified that I am speaking with the correct person using two identifiers.  Location: Patient: Daughters house ([place] ) Provider: [facility] Other persons involved in call: Nena Hoa (daughter)   I discussed the limitations, risks, security and privacy concerns of performing an evaluation and management service by telephone and the availability of in person appointments. I also discussed with the patient that there may be a patient responsible charge related to this service. The patient expressed understanding and agreed to proceed.  REASON FOR VISIT: Follow-up right breast cancer  PRIOR THERAPY: None (refused surgical intervention)  CURRENT THERAPY: Anastrozole   INTERVAL HISTORY:  Krista Payne is contacted today for follow-up of her right breast invasive lobular carcinoma. She was last seen by Dr. Rogers on 12/24/2023. Patient's daughter assists with history. Patient's daughter reports that they have started the process of moving the patient to [place]  to live with her daughter.  She still has her [place] residential address, but is spending the majority of her time in [place] , with plans to make full transition to [place]  by the end of the year.  At today's visit, she reports feeling fairly well.   She denies any recent hospitalizations, surgeries, or changes in her baseline health status. She reports 25% energy and 100% appetite.  She is maintaining stable weight at this time.   She continues to take anastrozole  daily, tolerating this well.   Per her report, she does not feel that her right breast mass has grown in size. She denies any new breast masses or axillary lymphadenopathy. No new onset bone pain, chest pain, dyspnea, or abdominal pain.   She has no new headaches, seizures, or focal neurologic deficits.   No B  symptoms such as fever, chills, night sweats, unintentional weight loss.  She reports baseline fatigue. No ice pica. She denies any rectal bleeding or melena. She is taking iron tablet 3 times weekly with stool softener.  ASSESSMENT & PLAN:  1.  Right breast invasive lobular carcinoma: - Stereotactic biopsy on 06/18/2019 showed ILC in the right breast lower inner quadrant.  90% positive for ER/PR.  Ki-67 was 5%.  Tumor negative for HER-2. - She refused surgical intervention. - MRI of the breast on 08/24/2019 showed biopsy-proven right breast invasive lobular carcinoma and atypical lobular hyperplasia.  No additional suspicious enhancements. - She was started on anastrozole  on 08/24/2019. - MRI of the breast on 02/19/2020 showed treatment response with enhancement at the site of atypical lobular hyperplasia in the upper inner quadrant of the right breast measuring 0.9 x 0.5 cm (previously 1.3 x 0.9 cm).  Enhancement at the site of known invasive lobular carcinoma in the lower inner quadrant of the right breast has resolved completely.  Left breast remains negative.  No adenopathy. - MRI of the breast on 08/29/2020 shows non-mass enhancement involving the lower outer quadrant at anterior depth spanning 9 x 8 x 3 mm demonstrating progressive enhancement kinetics, new since prior MRI in April.  No mammographic correlate.  No abnormal enhancement at the clip biopsy site.  Left breast has no lesions. ------------------------- - She is tolerating anastrozole  fairly well.   - Most recent mammogram (06/16/2024): BI-RADS Category 6 (known biopsy-proven malignancy) showing stable mammographic appearance of right breast, with no interval change to indicate progression of disease. - Physical exam (performed at office  visit by Dr. Rogers on 12/24/2023): No palpable masses or adenopathy in the bilateral breast. - Most recent labs (06/16/2024): Normal CBC/D.  LFTs normal. - PLAN: Patient is moving to [place] .  They  are working to establish care with oncologist in [place] .  We recommend the following: Continue anastrozole  daily.  Continue mammography every 6 months (alternating bilateral diagnostic MM with unilateral right breast diagnostic MM).  Next due for bilateral diagnostic mammogram in February 2026. Recommend repeat labs and office visit in 6 months for physical exam.  2.  Osteopenia: - DEXA scan on 07/20/2019 shows T score of -1.9. - Vitamin D  level from 12/17/2023 normal at 35.70.   - She takes Vitamin D3 gummy   - PLAN:  She is due for repeat DEXA/bone density.  3.  Normocytic anemia: - Mild intermittent anemia related to CKD stage IIIb/IV and functional iron deficiency. - She follows with Dr. Rachele for her CKD. - She is taking iron supplements 3 times weekly.   - Most recent labs open (06/16/2024) with normal hemoglobin 12.7, ferritin 94, iron saturation 25%. - PLAN: Continue iron tablet 3 times weekly.  Recheck in 6 to 12 months.  PLAN SUMMARY: >> Tentative discharge from clinic (patient moving to [place] , will establish care with local oncologist) with the following recommendations: Bilateral diagnostic mammogram in 6 months (February 2026) DEXA/bone density scan in 6 months Labs, office visit, and physical exam in 6 months     REVIEW OF SYSTEMS:   Review of Systems  Constitutional:  Positive for malaise/fatigue. Negative for chills, diaphoresis, fever and weight loss.  Respiratory:  Negative for cough and shortness of breath.   Cardiovascular:  Negative for chest pain and palpitations.  Gastrointestinal:  Positive for diarrhea. Negative for abdominal pain, blood in stool, melena, nausea and vomiting.  Neurological:  Negative for dizziness and headaches.     PHYSICAL EXAM: (per limitations of virtual telephone visit)  The patient is alert and oriented x 3, exhibiting adequate mentation, good mood, and ability to speak in full sentences and execute sound judgement.  WRAP UP:   I  discussed the assessment and treatment plan with the patient. The patient was provided an opportunity to ask questions and all were answered. The patient agreed with the plan and demonstrated an understanding of the instructions.   The patient was advised to call back or seek an in-person evaluation if the symptoms worsen or if the condition fails to improve as anticipated.  I provided 35 minutes of non-face-to-face time during this encounter, including > 10 minutes of medical discussion.  Krista CHRISTELLA Barefoot, PA-C 06/23/24 9:10 AM

## 2024-06-23 ENCOUNTER — Inpatient Hospital Stay (HOSPITAL_BASED_OUTPATIENT_CLINIC_OR_DEPARTMENT_OTHER): Payer: Medicare HMO | Admitting: Physician Assistant

## 2024-06-23 ENCOUNTER — Encounter: Payer: Self-pay | Admitting: Physician Assistant

## 2024-06-23 DIAGNOSIS — C50911 Malignant neoplasm of unspecified site of right female breast: Secondary | ICD-10-CM | POA: Diagnosis not present

## 2024-06-23 DIAGNOSIS — Z1732 Human epidermal growth factor receptor 2 negative status: Secondary | ICD-10-CM

## 2024-06-23 DIAGNOSIS — Z170 Estrogen receptor positive status [ER+]: Secondary | ICD-10-CM

## 2024-06-23 DIAGNOSIS — Z1721 Progesterone receptor positive status: Secondary | ICD-10-CM | POA: Diagnosis not present

## 2024-06-23 DIAGNOSIS — M8580 Other specified disorders of bone density and structure, unspecified site: Secondary | ICD-10-CM

## 2024-06-23 DIAGNOSIS — Z79811 Long term (current) use of aromatase inhibitors: Secondary | ICD-10-CM

## 2024-06-23 MED ORDER — ANASTROZOLE 1 MG PO TABS: 1.0000 mg | ORAL_TABLET | Freq: Every day | ORAL | 3 refills | Status: AC

## 2024-06-25 DIAGNOSIS — I5032 Chronic diastolic (congestive) heart failure: Secondary | ICD-10-CM | POA: Diagnosis not present

## 2024-06-25 DIAGNOSIS — I10 Essential (primary) hypertension: Secondary | ICD-10-CM | POA: Diagnosis not present

## 2024-06-25 DIAGNOSIS — Z853 Personal history of malignant neoplasm of breast: Secondary | ICD-10-CM | POA: Diagnosis not present

## 2024-06-25 DIAGNOSIS — N390 Urinary tract infection, site not specified: Secondary | ICD-10-CM | POA: Diagnosis not present

## 2024-06-25 DIAGNOSIS — E1165 Type 2 diabetes mellitus with hyperglycemia: Secondary | ICD-10-CM | POA: Diagnosis not present

## 2024-08-03 DIAGNOSIS — H43813 Vitreous degeneration, bilateral: Secondary | ICD-10-CM | POA: Diagnosis not present

## 2024-08-03 DIAGNOSIS — H353231 Exudative age-related macular degeneration, bilateral, with active choroidal neovascularization: Secondary | ICD-10-CM | POA: Diagnosis not present

## 2024-08-03 DIAGNOSIS — E119 Type 2 diabetes mellitus without complications: Secondary | ICD-10-CM | POA: Diagnosis not present

## 2024-08-03 DIAGNOSIS — H35033 Hypertensive retinopathy, bilateral: Secondary | ICD-10-CM | POA: Diagnosis not present

## 2024-08-04 DIAGNOSIS — R300 Dysuria: Secondary | ICD-10-CM | POA: Diagnosis not present

## 2024-08-06 ENCOUNTER — Telehealth: Payer: Self-pay | Admitting: Internal Medicine

## 2024-08-06 NOTE — Telephone Encounter (Signed)
 Not our patient called patient daughter to let her know.

## 2024-08-06 NOTE — Telephone Encounter (Signed)
 Copied from CRM [id number]. Topic: Medical Record Request - Records Request >> Aug 05, 2024  4:10 PM Delon T wrote: Reason for CRM: daughter Nena needs copy of last visit, lab reports and xray reports, having issues with mychart- would like them emailed if possible [email or web address]- needs to be able to forward to doctor in [place]  [phone]

## 2024-08-17 DIAGNOSIS — H353231 Exudative age-related macular degeneration, bilateral, with active choroidal neovascularization: Secondary | ICD-10-CM | POA: Diagnosis not present

## 2024-09-02 DIAGNOSIS — R300 Dysuria: Secondary | ICD-10-CM | POA: Diagnosis not present

## 2024-09-14 DIAGNOSIS — H353231 Exudative age-related macular degeneration, bilateral, with active choroidal neovascularization: Secondary | ICD-10-CM | POA: Diagnosis not present

## 2024-09-23 DIAGNOSIS — R600 Localized edema: Secondary | ICD-10-CM | POA: Diagnosis not present

## 2024-09-23 DIAGNOSIS — Z0389 Encounter for observation for other suspected diseases and conditions ruled out: Secondary | ICD-10-CM | POA: Diagnosis not present

## 2024-09-29 DIAGNOSIS — Z13228 Encounter for screening for other metabolic disorders: Secondary | ICD-10-CM | POA: Diagnosis not present

## 2024-09-29 DIAGNOSIS — I10 Essential (primary) hypertension: Secondary | ICD-10-CM | POA: Diagnosis not present

## 2024-09-29 DIAGNOSIS — Z794 Long term (current) use of insulin: Secondary | ICD-10-CM | POA: Diagnosis not present

## 2024-09-29 DIAGNOSIS — M899 Disorder of bone, unspecified: Secondary | ICD-10-CM | POA: Diagnosis not present

## 2024-09-29 DIAGNOSIS — N184 Chronic kidney disease, stage 4 (severe): Secondary | ICD-10-CM | POA: Diagnosis not present

## 2024-09-29 DIAGNOSIS — N189 Chronic kidney disease, unspecified: Secondary | ICD-10-CM | POA: Diagnosis not present

## 2024-09-29 DIAGNOSIS — E1122 Type 2 diabetes mellitus with diabetic chronic kidney disease: Secondary | ICD-10-CM | POA: Diagnosis not present

## 2024-10-12 DIAGNOSIS — H353231 Exudative age-related macular degeneration, bilateral, with active choroidal neovascularization: Secondary | ICD-10-CM | POA: Diagnosis not present
